# Patient Record
Sex: Female | Born: 1940 | Race: White | Hispanic: No | Smoking: Former smoker
Health system: Southern US, Community
[De-identification: ages and names within clinical notes are randomized; demographics above are authoritative.]

## PROBLEM LIST (undated history)

## (undated) DIAGNOSIS — I10 Essential (primary) hypertension: Secondary | ICD-10-CM

## (undated) DIAGNOSIS — C50912 Malignant neoplasm of unspecified site of left female breast: Secondary | ICD-10-CM

## (undated) DIAGNOSIS — I739 Peripheral vascular disease, unspecified: Secondary | ICD-10-CM

## (undated) DIAGNOSIS — E7800 Pure hypercholesterolemia, unspecified: Secondary | ICD-10-CM

## (undated) DIAGNOSIS — J45909 Unspecified asthma, uncomplicated: Secondary | ICD-10-CM

## (undated) DIAGNOSIS — R011 Cardiac murmur, unspecified: Secondary | ICD-10-CM

## (undated) DIAGNOSIS — K759 Inflammatory liver disease, unspecified: Secondary | ICD-10-CM

## (undated) DIAGNOSIS — G8929 Other chronic pain: Secondary | ICD-10-CM

## (undated) DIAGNOSIS — G4730 Sleep apnea, unspecified: Secondary | ICD-10-CM

## (undated) DIAGNOSIS — I82409 Acute embolism and thrombosis of unspecified deep veins of unspecified lower extremity: Secondary | ICD-10-CM

## (undated) DIAGNOSIS — I700 Atherosclerosis of aorta: Secondary | ICD-10-CM

## (undated) DIAGNOSIS — Z9289 Personal history of other medical treatment: Secondary | ICD-10-CM

## (undated) DIAGNOSIS — K219 Gastro-esophageal reflux disease without esophagitis: Secondary | ICD-10-CM

## (undated) DIAGNOSIS — I509 Heart failure, unspecified: Secondary | ICD-10-CM

## (undated) DIAGNOSIS — I219 Acute myocardial infarction, unspecified: Secondary | ICD-10-CM

## (undated) DIAGNOSIS — R519 Headache, unspecified: Secondary | ICD-10-CM

## (undated) DIAGNOSIS — IMO0001 Reserved for inherently not codable concepts without codable children: Secondary | ICD-10-CM

## (undated) DIAGNOSIS — R51 Headache: Secondary | ICD-10-CM

## (undated) DIAGNOSIS — I4891 Unspecified atrial fibrillation: Secondary | ICD-10-CM

## (undated) DIAGNOSIS — F329 Major depressive disorder, single episode, unspecified: Secondary | ICD-10-CM

## (undated) DIAGNOSIS — J449 Chronic obstructive pulmonary disease, unspecified: Secondary | ICD-10-CM

## (undated) DIAGNOSIS — E119 Type 2 diabetes mellitus without complications: Secondary | ICD-10-CM

## (undated) DIAGNOSIS — G2581 Restless legs syndrome: Secondary | ICD-10-CM

## (undated) DIAGNOSIS — I2510 Atherosclerotic heart disease of native coronary artery without angina pectoris: Secondary | ICD-10-CM

## (undated) DIAGNOSIS — E890 Postprocedural hypothyroidism: Secondary | ICD-10-CM

## (undated) DIAGNOSIS — F419 Anxiety disorder, unspecified: Secondary | ICD-10-CM

## (undated) DIAGNOSIS — Z8719 Personal history of other diseases of the digestive system: Secondary | ICD-10-CM

## (undated) DIAGNOSIS — M1990 Unspecified osteoarthritis, unspecified site: Secondary | ICD-10-CM

## (undated) DIAGNOSIS — Z9889 Other specified postprocedural states: Secondary | ICD-10-CM

## (undated) DIAGNOSIS — K5900 Constipation, unspecified: Secondary | ICD-10-CM

## (undated) DIAGNOSIS — D649 Anemia, unspecified: Secondary | ICD-10-CM

## (undated) DIAGNOSIS — G629 Polyneuropathy, unspecified: Secondary | ICD-10-CM

## (undated) DIAGNOSIS — M549 Dorsalgia, unspecified: Secondary | ICD-10-CM

## (undated) HISTORY — DX: Other specified postprocedural states: Z98.890

## (undated) HISTORY — PX: BREAST BIOPSY: SHX20

## (undated) HISTORY — DX: Gastro-esophageal reflux disease without esophagitis: K21.9

## (undated) HISTORY — DX: Atherosclerotic heart disease of native coronary artery without angina pectoris: I25.10

## (undated) HISTORY — PX: TRIGGER FINGER RELEASE: SHX641

## (undated) HISTORY — PX: HAMMER TOE SURGERY: SHX385

## (undated) HISTORY — PX: BARTHOLIN CYST MARSUPIALIZATION: SHX5383

## (undated) HISTORY — DX: Pure hypercholesterolemia, unspecified: E78.00

## (undated) HISTORY — DX: Essential (primary) hypertension: I10

## (undated) HISTORY — DX: Postprocedural hypothyroidism: E89.0

## (undated) HISTORY — PX: TUBAL LIGATION: SHX77

## (undated) HISTORY — DX: Sleep apnea, unspecified: G47.30

## (undated) HISTORY — PX: EYE SURGERY: SHX253

## (undated) HISTORY — DX: Morbid (severe) obesity due to excess calories: E66.01

## (undated) HISTORY — PX: SHOULDER SURGERY: SHX246

## (undated) HISTORY — DX: Constipation, unspecified: K59.00

## (undated) HISTORY — PX: VAGINAL HYSTERECTOMY: SUR661

## (undated) HISTORY — PX: TOE SURGERY: SHX1073

## (undated) HISTORY — DX: Major depressive disorder, single episode, unspecified: F32.9

## (undated) HISTORY — PX: CORONARY ANGIOPLASTY WITH STENT PLACEMENT: SHX49

## (undated) HISTORY — PX: JOINT REPLACEMENT: SHX530

## (undated) HISTORY — PX: CATARACT EXTRACTION W/ INTRAOCULAR LENS  IMPLANT, BILATERAL: SHX1307

## (undated) HISTORY — DX: Atherosclerosis of aorta: I70.0

## (undated) HISTORY — DX: Chronic obstructive pulmonary disease, unspecified: J44.9

---
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
## 1988-09-06 HISTORY — PX: TOTAL KNEE ARTHROPLASTY: SHX125

## 1988-09-06 HISTORY — PX: TOTAL HIP ARTHROPLASTY: SHX124

## 1988-09-06 HISTORY — PX: MASTECTOMY: SHX3

## 1998-09-07 HISTORY — PX: CARDIAC CATHETERIZATION: SHX172

## 2007-01-07 HISTORY — PX: CAROTID ENDARTERECTOMY: SUR193

## 2011-01-10 DIAGNOSIS — I259 Chronic ischemic heart disease, unspecified: Secondary | ICD-10-CM | POA: Diagnosis not present

## 2011-01-10 DIAGNOSIS — E119 Type 2 diabetes mellitus without complications: Secondary | ICD-10-CM | POA: Diagnosis not present

## 2011-01-10 DIAGNOSIS — J449 Chronic obstructive pulmonary disease, unspecified: Secondary | ICD-10-CM | POA: Diagnosis not present

## 2011-01-23 DIAGNOSIS — R569 Unspecified convulsions: Secondary | ICD-10-CM | POA: Diagnosis not present

## 2011-02-05 DIAGNOSIS — L851 Acquired keratosis [keratoderma] palmaris et plantaris: Secondary | ICD-10-CM | POA: Diagnosis not present

## 2011-02-05 DIAGNOSIS — E1049 Type 1 diabetes mellitus with other diabetic neurological complication: Secondary | ICD-10-CM | POA: Diagnosis not present

## 2011-02-05 DIAGNOSIS — B351 Tinea unguium: Secondary | ICD-10-CM | POA: Diagnosis not present

## 2011-02-06 DIAGNOSIS — G4737 Central sleep apnea in conditions classified elsewhere: Secondary | ICD-10-CM | POA: Diagnosis not present

## 2011-02-19 DIAGNOSIS — I519 Heart disease, unspecified: Secondary | ICD-10-CM | POA: Diagnosis not present

## 2011-02-19 DIAGNOSIS — E119 Type 2 diabetes mellitus without complications: Secondary | ICD-10-CM | POA: Diagnosis not present

## 2011-02-19 DIAGNOSIS — J449 Chronic obstructive pulmonary disease, unspecified: Secondary | ICD-10-CM | POA: Diagnosis not present

## 2011-02-19 DIAGNOSIS — I10 Essential (primary) hypertension: Secondary | ICD-10-CM | POA: Diagnosis not present

## 2011-02-25 DIAGNOSIS — F329 Major depressive disorder, single episode, unspecified: Secondary | ICD-10-CM | POA: Diagnosis not present

## 2011-02-25 DIAGNOSIS — E0590 Thyrotoxicosis, unspecified without thyrotoxic crisis or storm: Secondary | ICD-10-CM | POA: Diagnosis not present

## 2011-02-25 DIAGNOSIS — I519 Heart disease, unspecified: Secondary | ICD-10-CM | POA: Diagnosis not present

## 2011-02-25 DIAGNOSIS — I10 Essential (primary) hypertension: Secondary | ICD-10-CM | POA: Diagnosis not present

## 2011-02-25 DIAGNOSIS — E119 Type 2 diabetes mellitus without complications: Secondary | ICD-10-CM | POA: Diagnosis not present

## 2011-03-19 DIAGNOSIS — I10 Essential (primary) hypertension: Secondary | ICD-10-CM | POA: Diagnosis not present

## 2011-03-19 DIAGNOSIS — J449 Chronic obstructive pulmonary disease, unspecified: Secondary | ICD-10-CM | POA: Diagnosis not present

## 2011-03-19 DIAGNOSIS — E109 Type 1 diabetes mellitus without complications: Secondary | ICD-10-CM | POA: Diagnosis not present

## 2011-03-24 DIAGNOSIS — M25549 Pain in joints of unspecified hand: Secondary | ICD-10-CM | POA: Diagnosis not present

## 2011-03-24 DIAGNOSIS — M1710 Unilateral primary osteoarthritis, unspecified knee: Secondary | ICD-10-CM | POA: Diagnosis not present

## 2011-03-24 DIAGNOSIS — M25569 Pain in unspecified knee: Secondary | ICD-10-CM | POA: Diagnosis not present

## 2011-03-24 DIAGNOSIS — M67919 Unspecified disorder of synovium and tendon, unspecified shoulder: Secondary | ICD-10-CM | POA: Diagnosis not present

## 2011-03-24 DIAGNOSIS — M719 Bursopathy, unspecified: Secondary | ICD-10-CM | POA: Diagnosis not present

## 2011-03-25 DIAGNOSIS — E1142 Type 2 diabetes mellitus with diabetic polyneuropathy: Secondary | ICD-10-CM | POA: Diagnosis not present

## 2011-03-25 DIAGNOSIS — E1149 Type 2 diabetes mellitus with other diabetic neurological complication: Secondary | ICD-10-CM | POA: Diagnosis not present

## 2011-03-25 DIAGNOSIS — I70209 Unspecified atherosclerosis of native arteries of extremities, unspecified extremity: Secondary | ICD-10-CM | POA: Diagnosis not present

## 2011-04-07 DIAGNOSIS — J449 Chronic obstructive pulmonary disease, unspecified: Secondary | ICD-10-CM | POA: Diagnosis not present

## 2011-04-07 DIAGNOSIS — G4733 Obstructive sleep apnea (adult) (pediatric): Secondary | ICD-10-CM | POA: Diagnosis not present

## 2011-04-08 DIAGNOSIS — J449 Chronic obstructive pulmonary disease, unspecified: Secondary | ICD-10-CM | POA: Diagnosis not present

## 2011-04-24 DIAGNOSIS — M5137 Other intervertebral disc degeneration, lumbosacral region: Secondary | ICD-10-CM | POA: Diagnosis not present

## 2011-04-24 DIAGNOSIS — M999 Biomechanical lesion, unspecified: Secondary | ICD-10-CM | POA: Diagnosis not present

## 2011-04-24 DIAGNOSIS — M545 Low back pain: Secondary | ICD-10-CM | POA: Diagnosis not present

## 2011-05-06 DIAGNOSIS — I10 Essential (primary) hypertension: Secondary | ICD-10-CM | POA: Diagnosis not present

## 2011-05-06 DIAGNOSIS — E11329 Type 2 diabetes mellitus with mild nonproliferative diabetic retinopathy without macular edema: Secondary | ICD-10-CM | POA: Diagnosis not present

## 2011-05-06 DIAGNOSIS — E1139 Type 2 diabetes mellitus with other diabetic ophthalmic complication: Secondary | ICD-10-CM | POA: Diagnosis not present

## 2011-05-23 DIAGNOSIS — J449 Chronic obstructive pulmonary disease, unspecified: Secondary | ICD-10-CM | POA: Diagnosis not present

## 2011-05-23 DIAGNOSIS — M5030 Other cervical disc degeneration, unspecified cervical region: Secondary | ICD-10-CM | POA: Diagnosis not present

## 2011-05-23 DIAGNOSIS — M5137 Other intervertebral disc degeneration, lumbosacral region: Secondary | ICD-10-CM | POA: Diagnosis not present

## 2011-05-26 DIAGNOSIS — E785 Hyperlipidemia, unspecified: Secondary | ICD-10-CM | POA: Diagnosis not present

## 2011-05-26 DIAGNOSIS — E039 Hypothyroidism, unspecified: Secondary | ICD-10-CM | POA: Diagnosis not present

## 2011-05-26 DIAGNOSIS — J441 Chronic obstructive pulmonary disease with (acute) exacerbation: Secondary | ICD-10-CM | POA: Diagnosis not present

## 2011-05-26 DIAGNOSIS — I214 Non-ST elevation (NSTEMI) myocardial infarction: Secondary | ICD-10-CM | POA: Diagnosis not present

## 2011-05-26 DIAGNOSIS — I4460 Unspecified fascicular block: Secondary | ICD-10-CM | POA: Diagnosis not present

## 2011-05-26 DIAGNOSIS — R079 Chest pain, unspecified: Secondary | ICD-10-CM | POA: Diagnosis not present

## 2011-05-26 DIAGNOSIS — K219 Gastro-esophageal reflux disease without esophagitis: Secondary | ICD-10-CM | POA: Diagnosis not present

## 2011-05-27 DIAGNOSIS — E039 Hypothyroidism, unspecified: Secondary | ICD-10-CM | POA: Diagnosis not present

## 2011-05-27 DIAGNOSIS — I2510 Atherosclerotic heart disease of native coronary artery without angina pectoris: Secondary | ICD-10-CM | POA: Diagnosis not present

## 2011-05-27 DIAGNOSIS — Z794 Long term (current) use of insulin: Secondary | ICD-10-CM | POA: Diagnosis not present

## 2011-05-27 DIAGNOSIS — I252 Old myocardial infarction: Secondary | ICD-10-CM | POA: Diagnosis not present

## 2011-05-27 DIAGNOSIS — I359 Nonrheumatic aortic valve disorder, unspecified: Secondary | ICD-10-CM | POA: Diagnosis not present

## 2011-05-27 DIAGNOSIS — J449 Chronic obstructive pulmonary disease, unspecified: Secondary | ICD-10-CM | POA: Diagnosis not present

## 2011-05-27 DIAGNOSIS — Z9010 Acquired absence of unspecified breast and nipple: Secondary | ICD-10-CM | POA: Diagnosis not present

## 2011-05-27 DIAGNOSIS — Z7982 Long term (current) use of aspirin: Secondary | ICD-10-CM | POA: Diagnosis not present

## 2011-05-27 DIAGNOSIS — I214 Non-ST elevation (NSTEMI) myocardial infarction: Secondary | ICD-10-CM | POA: Diagnosis not present

## 2011-05-27 DIAGNOSIS — Z853 Personal history of malignant neoplasm of breast: Secondary | ICD-10-CM | POA: Diagnosis not present

## 2011-05-27 DIAGNOSIS — E119 Type 2 diabetes mellitus without complications: Secondary | ICD-10-CM | POA: Diagnosis not present

## 2011-05-27 DIAGNOSIS — Z9981 Dependence on supplemental oxygen: Secondary | ICD-10-CM | POA: Diagnosis not present

## 2011-05-27 DIAGNOSIS — E785 Hyperlipidemia, unspecified: Secondary | ICD-10-CM | POA: Diagnosis not present

## 2011-05-27 DIAGNOSIS — I739 Peripheral vascular disease, unspecified: Secondary | ICD-10-CM | POA: Diagnosis not present

## 2011-05-27 DIAGNOSIS — I10 Essential (primary) hypertension: Secondary | ICD-10-CM | POA: Diagnosis not present

## 2011-05-27 DIAGNOSIS — E782 Mixed hyperlipidemia: Secondary | ICD-10-CM | POA: Diagnosis not present

## 2011-05-27 DIAGNOSIS — I6529 Occlusion and stenosis of unspecified carotid artery: Secondary | ICD-10-CM | POA: Diagnosis not present

## 2011-05-27 DIAGNOSIS — I509 Heart failure, unspecified: Secondary | ICD-10-CM | POA: Diagnosis not present

## 2011-05-27 DIAGNOSIS — Z9861 Coronary angioplasty status: Secondary | ICD-10-CM | POA: Diagnosis not present

## 2011-05-27 DIAGNOSIS — F411 Generalized anxiety disorder: Secondary | ICD-10-CM | POA: Diagnosis not present

## 2011-05-27 DIAGNOSIS — J438 Other emphysema: Secondary | ICD-10-CM | POA: Diagnosis not present

## 2011-05-27 DIAGNOSIS — F329 Major depressive disorder, single episode, unspecified: Secondary | ICD-10-CM | POA: Diagnosis not present

## 2011-05-27 DIAGNOSIS — R079 Chest pain, unspecified: Secondary | ICD-10-CM | POA: Diagnosis not present

## 2011-05-27 DIAGNOSIS — M7989 Other specified soft tissue disorders: Secondary | ICD-10-CM | POA: Diagnosis not present

## 2011-05-27 DIAGNOSIS — Z87891 Personal history of nicotine dependence: Secondary | ICD-10-CM | POA: Diagnosis not present

## 2011-05-27 DIAGNOSIS — K219 Gastro-esophageal reflux disease without esophagitis: Secondary | ICD-10-CM | POA: Diagnosis not present

## 2011-05-28 DIAGNOSIS — J449 Chronic obstructive pulmonary disease, unspecified: Secondary | ICD-10-CM | POA: Diagnosis not present

## 2011-05-28 DIAGNOSIS — I2510 Atherosclerotic heart disease of native coronary artery without angina pectoris: Secondary | ICD-10-CM | POA: Diagnosis not present

## 2011-05-28 DIAGNOSIS — E119 Type 2 diabetes mellitus without complications: Secondary | ICD-10-CM | POA: Diagnosis not present

## 2011-05-28 DIAGNOSIS — I6529 Occlusion and stenosis of unspecified carotid artery: Secondary | ICD-10-CM | POA: Diagnosis not present

## 2011-05-28 DIAGNOSIS — J438 Other emphysema: Secondary | ICD-10-CM | POA: Diagnosis not present

## 2011-05-28 DIAGNOSIS — I214 Non-ST elevation (NSTEMI) myocardial infarction: Secondary | ICD-10-CM | POA: Diagnosis not present

## 2011-05-28 DIAGNOSIS — I252 Old myocardial infarction: Secondary | ICD-10-CM | POA: Diagnosis not present

## 2011-05-29 DIAGNOSIS — I519 Heart disease, unspecified: Secondary | ICD-10-CM | POA: Diagnosis not present

## 2011-05-29 DIAGNOSIS — J449 Chronic obstructive pulmonary disease, unspecified: Secondary | ICD-10-CM | POA: Diagnosis not present

## 2011-06-03 DIAGNOSIS — M25559 Pain in unspecified hip: Secondary | ICD-10-CM | POA: Diagnosis not present

## 2011-06-03 DIAGNOSIS — M5137 Other intervertebral disc degeneration, lumbosacral region: Secondary | ICD-10-CM | POA: Diagnosis not present

## 2011-06-03 DIAGNOSIS — W19XXXA Unspecified fall, initial encounter: Secondary | ICD-10-CM | POA: Diagnosis not present

## 2011-06-03 DIAGNOSIS — M545 Low back pain: Secondary | ICD-10-CM | POA: Diagnosis not present

## 2011-06-03 DIAGNOSIS — Z96649 Presence of unspecified artificial hip joint: Secondary | ICD-10-CM | POA: Diagnosis not present

## 2011-06-05 DIAGNOSIS — M719 Bursopathy, unspecified: Secondary | ICD-10-CM | POA: Diagnosis not present

## 2011-06-05 DIAGNOSIS — M67919 Unspecified disorder of synovium and tendon, unspecified shoulder: Secondary | ICD-10-CM | POA: Diagnosis not present

## 2011-06-16 DIAGNOSIS — J449 Chronic obstructive pulmonary disease, unspecified: Secondary | ICD-10-CM | POA: Diagnosis not present

## 2011-06-16 DIAGNOSIS — G4733 Obstructive sleep apnea (adult) (pediatric): Secondary | ICD-10-CM | POA: Diagnosis not present

## 2011-06-26 DIAGNOSIS — I509 Heart failure, unspecified: Secondary | ICD-10-CM | POA: Diagnosis not present

## 2011-06-26 DIAGNOSIS — I10 Essential (primary) hypertension: Secondary | ICD-10-CM | POA: Diagnosis not present

## 2011-06-26 DIAGNOSIS — E7800 Pure hypercholesterolemia, unspecified: Secondary | ICD-10-CM | POA: Diagnosis not present

## 2011-06-26 DIAGNOSIS — I519 Heart disease, unspecified: Secondary | ICD-10-CM | POA: Diagnosis not present

## 2011-06-27 DIAGNOSIS — E7800 Pure hypercholesterolemia, unspecified: Secondary | ICD-10-CM | POA: Diagnosis not present

## 2011-06-27 DIAGNOSIS — I10 Essential (primary) hypertension: Secondary | ICD-10-CM | POA: Diagnosis not present

## 2011-06-27 DIAGNOSIS — E039 Hypothyroidism, unspecified: Secondary | ICD-10-CM | POA: Diagnosis not present

## 2011-06-27 DIAGNOSIS — E119 Type 2 diabetes mellitus without complications: Secondary | ICD-10-CM | POA: Diagnosis not present

## 2011-06-27 DIAGNOSIS — I519 Heart disease, unspecified: Secondary | ICD-10-CM | POA: Diagnosis not present

## 2011-06-27 DIAGNOSIS — I509 Heart failure, unspecified: Secondary | ICD-10-CM | POA: Diagnosis not present

## 2011-06-30 DIAGNOSIS — E782 Mixed hyperlipidemia: Secondary | ICD-10-CM | POA: Diagnosis not present

## 2011-06-30 DIAGNOSIS — I2510 Atherosclerotic heart disease of native coronary artery without angina pectoris: Secondary | ICD-10-CM | POA: Diagnosis not present

## 2011-06-30 DIAGNOSIS — I214 Non-ST elevation (NSTEMI) myocardial infarction: Secondary | ICD-10-CM | POA: Diagnosis not present

## 2011-06-30 DIAGNOSIS — I10 Essential (primary) hypertension: Secondary | ICD-10-CM | POA: Diagnosis not present

## 2011-07-03 DIAGNOSIS — M719 Bursopathy, unspecified: Secondary | ICD-10-CM | POA: Diagnosis not present

## 2011-07-03 DIAGNOSIS — M67919 Unspecified disorder of synovium and tendon, unspecified shoulder: Secondary | ICD-10-CM | POA: Diagnosis not present

## 2011-07-03 DIAGNOSIS — M5030 Other cervical disc degeneration, unspecified cervical region: Secondary | ICD-10-CM | POA: Diagnosis not present

## 2011-07-03 DIAGNOSIS — M542 Cervicalgia: Secondary | ICD-10-CM | POA: Diagnosis not present

## 2011-07-03 DIAGNOSIS — M47812 Spondylosis without myelopathy or radiculopathy, cervical region: Secondary | ICD-10-CM | POA: Diagnosis not present

## 2011-07-04 DIAGNOSIS — K219 Gastro-esophageal reflux disease without esophagitis: Secondary | ICD-10-CM | POA: Diagnosis not present

## 2011-07-04 DIAGNOSIS — I2510 Atherosclerotic heart disease of native coronary artery without angina pectoris: Secondary | ICD-10-CM | POA: Diagnosis not present

## 2011-07-04 DIAGNOSIS — Z96649 Presence of unspecified artificial hip joint: Secondary | ICD-10-CM | POA: Diagnosis not present

## 2011-07-04 DIAGNOSIS — Z87891 Personal history of nicotine dependence: Secondary | ICD-10-CM | POA: Diagnosis not present

## 2011-07-04 DIAGNOSIS — I359 Nonrheumatic aortic valve disorder, unspecified: Secondary | ICD-10-CM | POA: Diagnosis not present

## 2011-07-04 DIAGNOSIS — E1169 Type 2 diabetes mellitus with other specified complication: Secondary | ICD-10-CM | POA: Diagnosis not present

## 2011-07-04 DIAGNOSIS — R609 Edema, unspecified: Secondary | ICD-10-CM | POA: Diagnosis not present

## 2011-07-04 DIAGNOSIS — I10 Essential (primary) hypertension: Secondary | ICD-10-CM | POA: Diagnosis not present

## 2011-07-04 DIAGNOSIS — E669 Obesity, unspecified: Secondary | ICD-10-CM | POA: Diagnosis not present

## 2011-07-04 DIAGNOSIS — Z853 Personal history of malignant neoplasm of breast: Secondary | ICD-10-CM | POA: Diagnosis not present

## 2011-07-04 DIAGNOSIS — R030 Elevated blood-pressure reading, without diagnosis of hypertension: Secondary | ICD-10-CM | POA: Diagnosis not present

## 2011-07-04 DIAGNOSIS — E119 Type 2 diabetes mellitus without complications: Secondary | ICD-10-CM | POA: Diagnosis not present

## 2011-07-04 DIAGNOSIS — Z9861 Coronary angioplasty status: Secondary | ICD-10-CM | POA: Diagnosis not present

## 2011-07-04 DIAGNOSIS — Z7982 Long term (current) use of aspirin: Secondary | ICD-10-CM | POA: Diagnosis not present

## 2011-07-04 DIAGNOSIS — Z8249 Family history of ischemic heart disease and other diseases of the circulatory system: Secondary | ICD-10-CM | POA: Diagnosis not present

## 2011-07-04 DIAGNOSIS — R079 Chest pain, unspecified: Secondary | ICD-10-CM | POA: Diagnosis not present

## 2011-07-04 DIAGNOSIS — Z6836 Body mass index (BMI) 36.0-36.9, adult: Secondary | ICD-10-CM | POA: Diagnosis not present

## 2011-07-04 DIAGNOSIS — R011 Cardiac murmur, unspecified: Secondary | ICD-10-CM | POA: Diagnosis not present

## 2011-07-04 DIAGNOSIS — E785 Hyperlipidemia, unspecified: Secondary | ICD-10-CM | POA: Diagnosis not present

## 2011-07-04 DIAGNOSIS — R0602 Shortness of breath: Secondary | ICD-10-CM | POA: Diagnosis not present

## 2011-07-04 DIAGNOSIS — J449 Chronic obstructive pulmonary disease, unspecified: Secondary | ICD-10-CM | POA: Diagnosis not present

## 2011-07-04 DIAGNOSIS — E7800 Pure hypercholesterolemia, unspecified: Secondary | ICD-10-CM | POA: Diagnosis not present

## 2011-07-04 DIAGNOSIS — E039 Hypothyroidism, unspecified: Secondary | ICD-10-CM | POA: Diagnosis not present

## 2011-07-04 DIAGNOSIS — F411 Generalized anxiety disorder: Secondary | ICD-10-CM | POA: Diagnosis not present

## 2011-07-07 DIAGNOSIS — M4802 Spinal stenosis, cervical region: Secondary | ICD-10-CM | POA: Diagnosis not present

## 2011-07-08 DIAGNOSIS — G4733 Obstructive sleep apnea (adult) (pediatric): Secondary | ICD-10-CM | POA: Diagnosis not present

## 2011-07-08 DIAGNOSIS — M75100 Unspecified rotator cuff tear or rupture of unspecified shoulder, not specified as traumatic: Secondary | ICD-10-CM | POA: Diagnosis not present

## 2011-07-08 DIAGNOSIS — M25519 Pain in unspecified shoulder: Secondary | ICD-10-CM | POA: Diagnosis not present

## 2011-07-08 DIAGNOSIS — M19039 Primary osteoarthritis, unspecified wrist: Secondary | ICD-10-CM | POA: Diagnosis not present

## 2011-07-08 DIAGNOSIS — J449 Chronic obstructive pulmonary disease, unspecified: Secondary | ICD-10-CM | POA: Diagnosis not present

## 2011-07-11 DIAGNOSIS — E7800 Pure hypercholesterolemia, unspecified: Secondary | ICD-10-CM | POA: Diagnosis not present

## 2011-07-11 DIAGNOSIS — M75100 Unspecified rotator cuff tear or rupture of unspecified shoulder, not specified as traumatic: Secondary | ICD-10-CM | POA: Diagnosis not present

## 2011-07-11 DIAGNOSIS — E039 Hypothyroidism, unspecified: Secondary | ICD-10-CM | POA: Diagnosis not present

## 2011-07-11 DIAGNOSIS — M19039 Primary osteoarthritis, unspecified wrist: Secondary | ICD-10-CM | POA: Diagnosis not present

## 2011-07-11 DIAGNOSIS — M25519 Pain in unspecified shoulder: Secondary | ICD-10-CM | POA: Diagnosis not present

## 2011-07-11 DIAGNOSIS — I10 Essential (primary) hypertension: Secondary | ICD-10-CM | POA: Diagnosis not present

## 2011-07-14 DIAGNOSIS — M75100 Unspecified rotator cuff tear or rupture of unspecified shoulder, not specified as traumatic: Secondary | ICD-10-CM | POA: Diagnosis not present

## 2011-07-14 DIAGNOSIS — G4733 Obstructive sleep apnea (adult) (pediatric): Secondary | ICD-10-CM | POA: Diagnosis not present

## 2011-07-14 DIAGNOSIS — M19039 Primary osteoarthritis, unspecified wrist: Secondary | ICD-10-CM | POA: Diagnosis not present

## 2011-07-14 DIAGNOSIS — J449 Chronic obstructive pulmonary disease, unspecified: Secondary | ICD-10-CM | POA: Diagnosis not present

## 2011-07-14 DIAGNOSIS — M25519 Pain in unspecified shoulder: Secondary | ICD-10-CM | POA: Diagnosis not present

## 2011-07-16 DIAGNOSIS — M7730 Calcaneal spur, unspecified foot: Secondary | ICD-10-CM | POA: Diagnosis not present

## 2011-07-16 DIAGNOSIS — M19079 Primary osteoarthritis, unspecified ankle and foot: Secondary | ICD-10-CM | POA: Diagnosis not present

## 2011-07-16 DIAGNOSIS — M7750 Other enthesopathy of unspecified foot: Secondary | ICD-10-CM | POA: Diagnosis not present

## 2011-07-21 DIAGNOSIS — G4733 Obstructive sleep apnea (adult) (pediatric): Secondary | ICD-10-CM | POA: Diagnosis not present

## 2011-07-21 DIAGNOSIS — M75100 Unspecified rotator cuff tear or rupture of unspecified shoulder, not specified as traumatic: Secondary | ICD-10-CM | POA: Diagnosis not present

## 2011-07-21 DIAGNOSIS — M19039 Primary osteoarthritis, unspecified wrist: Secondary | ICD-10-CM | POA: Diagnosis not present

## 2011-07-21 DIAGNOSIS — M25519 Pain in unspecified shoulder: Secondary | ICD-10-CM | POA: Diagnosis not present

## 2011-07-21 DIAGNOSIS — J449 Chronic obstructive pulmonary disease, unspecified: Secondary | ICD-10-CM | POA: Diagnosis not present

## 2011-07-22 DIAGNOSIS — E1142 Type 2 diabetes mellitus with diabetic polyneuropathy: Secondary | ICD-10-CM | POA: Diagnosis not present

## 2011-07-22 DIAGNOSIS — I70209 Unspecified atherosclerosis of native arteries of extremities, unspecified extremity: Secondary | ICD-10-CM | POA: Diagnosis not present

## 2011-07-22 DIAGNOSIS — G4733 Obstructive sleep apnea (adult) (pediatric): Secondary | ICD-10-CM | POA: Diagnosis not present

## 2011-07-22 DIAGNOSIS — E1149 Type 2 diabetes mellitus with other diabetic neurological complication: Secondary | ICD-10-CM | POA: Diagnosis not present

## 2011-07-22 DIAGNOSIS — J449 Chronic obstructive pulmonary disease, unspecified: Secondary | ICD-10-CM | POA: Diagnosis not present

## 2011-07-24 DIAGNOSIS — M25519 Pain in unspecified shoulder: Secondary | ICD-10-CM | POA: Diagnosis not present

## 2011-07-24 DIAGNOSIS — G4733 Obstructive sleep apnea (adult) (pediatric): Secondary | ICD-10-CM | POA: Diagnosis not present

## 2011-07-24 DIAGNOSIS — J449 Chronic obstructive pulmonary disease, unspecified: Secondary | ICD-10-CM | POA: Diagnosis not present

## 2011-07-24 DIAGNOSIS — M19039 Primary osteoarthritis, unspecified wrist: Secondary | ICD-10-CM | POA: Diagnosis not present

## 2011-07-24 DIAGNOSIS — M75100 Unspecified rotator cuff tear or rupture of unspecified shoulder, not specified as traumatic: Secondary | ICD-10-CM | POA: Diagnosis not present

## 2011-07-28 DIAGNOSIS — M19039 Primary osteoarthritis, unspecified wrist: Secondary | ICD-10-CM | POA: Diagnosis not present

## 2011-07-28 DIAGNOSIS — M25519 Pain in unspecified shoulder: Secondary | ICD-10-CM | POA: Diagnosis not present

## 2011-07-28 DIAGNOSIS — J449 Chronic obstructive pulmonary disease, unspecified: Secondary | ICD-10-CM | POA: Diagnosis not present

## 2011-07-28 DIAGNOSIS — G4733 Obstructive sleep apnea (adult) (pediatric): Secondary | ICD-10-CM | POA: Diagnosis not present

## 2011-07-28 DIAGNOSIS — M75100 Unspecified rotator cuff tear or rupture of unspecified shoulder, not specified as traumatic: Secondary | ICD-10-CM | POA: Diagnosis not present

## 2011-07-29 DIAGNOSIS — I119 Hypertensive heart disease without heart failure: Secondary | ICD-10-CM | POA: Diagnosis not present

## 2011-07-31 DIAGNOSIS — M25519 Pain in unspecified shoulder: Secondary | ICD-10-CM | POA: Diagnosis not present

## 2011-07-31 DIAGNOSIS — M75100 Unspecified rotator cuff tear or rupture of unspecified shoulder, not specified as traumatic: Secondary | ICD-10-CM | POA: Diagnosis not present

## 2011-07-31 DIAGNOSIS — J449 Chronic obstructive pulmonary disease, unspecified: Secondary | ICD-10-CM | POA: Diagnosis not present

## 2011-07-31 DIAGNOSIS — M19039 Primary osteoarthritis, unspecified wrist: Secondary | ICD-10-CM | POA: Diagnosis not present

## 2011-07-31 DIAGNOSIS — G4733 Obstructive sleep apnea (adult) (pediatric): Secondary | ICD-10-CM | POA: Diagnosis not present

## 2011-08-04 DIAGNOSIS — J449 Chronic obstructive pulmonary disease, unspecified: Secondary | ICD-10-CM | POA: Diagnosis not present

## 2011-08-04 DIAGNOSIS — M19039 Primary osteoarthritis, unspecified wrist: Secondary | ICD-10-CM | POA: Diagnosis not present

## 2011-08-04 DIAGNOSIS — G4733 Obstructive sleep apnea (adult) (pediatric): Secondary | ICD-10-CM | POA: Diagnosis not present

## 2011-08-04 DIAGNOSIS — M75100 Unspecified rotator cuff tear or rupture of unspecified shoulder, not specified as traumatic: Secondary | ICD-10-CM | POA: Diagnosis not present

## 2011-08-04 DIAGNOSIS — M25519 Pain in unspecified shoulder: Secondary | ICD-10-CM | POA: Diagnosis not present

## 2011-08-05 DIAGNOSIS — M75100 Unspecified rotator cuff tear or rupture of unspecified shoulder, not specified as traumatic: Secondary | ICD-10-CM | POA: Diagnosis not present

## 2011-08-05 DIAGNOSIS — M19039 Primary osteoarthritis, unspecified wrist: Secondary | ICD-10-CM | POA: Diagnosis not present

## 2011-08-05 DIAGNOSIS — M25519 Pain in unspecified shoulder: Secondary | ICD-10-CM | POA: Diagnosis not present

## 2011-08-05 DIAGNOSIS — G4733 Obstructive sleep apnea (adult) (pediatric): Secondary | ICD-10-CM | POA: Diagnosis not present

## 2011-08-05 DIAGNOSIS — J449 Chronic obstructive pulmonary disease, unspecified: Secondary | ICD-10-CM | POA: Diagnosis not present

## 2011-08-07 DIAGNOSIS — I779 Disorder of arteries and arterioles, unspecified: Secondary | ICD-10-CM | POA: Diagnosis not present

## 2011-08-07 DIAGNOSIS — I739 Peripheral vascular disease, unspecified: Secondary | ICD-10-CM | POA: Diagnosis not present

## 2011-08-07 DIAGNOSIS — I719 Aortic aneurysm of unspecified site, without rupture: Secondary | ICD-10-CM | POA: Diagnosis not present

## 2011-08-11 DIAGNOSIS — M2040 Other hammer toe(s) (acquired), unspecified foot: Secondary | ICD-10-CM | POA: Diagnosis not present

## 2011-08-11 DIAGNOSIS — M24576 Contracture, unspecified foot: Secondary | ICD-10-CM | POA: Diagnosis not present

## 2011-08-11 DIAGNOSIS — M7750 Other enthesopathy of unspecified foot: Secondary | ICD-10-CM | POA: Diagnosis not present

## 2011-08-11 DIAGNOSIS — M779 Enthesopathy, unspecified: Secondary | ICD-10-CM | POA: Diagnosis not present

## 2011-08-11 DIAGNOSIS — E1159 Type 2 diabetes mellitus with other circulatory complications: Secondary | ICD-10-CM | POA: Diagnosis not present

## 2011-08-13 DIAGNOSIS — M67919 Unspecified disorder of synovium and tendon, unspecified shoulder: Secondary | ICD-10-CM | POA: Diagnosis not present

## 2011-08-13 DIAGNOSIS — M5030 Other cervical disc degeneration, unspecified cervical region: Secondary | ICD-10-CM | POA: Diagnosis not present

## 2011-08-14 DIAGNOSIS — Q278 Other specified congenital malformations of peripheral vascular system: Secondary | ICD-10-CM | POA: Diagnosis not present

## 2011-08-29 DIAGNOSIS — J449 Chronic obstructive pulmonary disease, unspecified: Secondary | ICD-10-CM | POA: Diagnosis not present

## 2011-08-29 DIAGNOSIS — G4733 Obstructive sleep apnea (adult) (pediatric): Secondary | ICD-10-CM | POA: Diagnosis not present

## 2011-08-29 DIAGNOSIS — E109 Type 1 diabetes mellitus without complications: Secondary | ICD-10-CM | POA: Diagnosis not present

## 2011-08-29 DIAGNOSIS — J209 Acute bronchitis, unspecified: Secondary | ICD-10-CM | POA: Diagnosis not present

## 2011-09-15 DIAGNOSIS — M2040 Other hammer toe(s) (acquired), unspecified foot: Secondary | ICD-10-CM | POA: Diagnosis not present

## 2011-09-15 DIAGNOSIS — M24576 Contracture, unspecified foot: Secondary | ICD-10-CM | POA: Diagnosis not present

## 2011-09-15 DIAGNOSIS — M24573 Contracture, unspecified ankle: Secondary | ICD-10-CM | POA: Diagnosis not present

## 2011-09-23 DIAGNOSIS — Z01818 Encounter for other preprocedural examination: Secondary | ICD-10-CM | POA: Diagnosis not present

## 2011-09-23 DIAGNOSIS — E782 Mixed hyperlipidemia: Secondary | ICD-10-CM | POA: Diagnosis not present

## 2011-09-23 DIAGNOSIS — I2510 Atherosclerotic heart disease of native coronary artery without angina pectoris: Secondary | ICD-10-CM | POA: Diagnosis not present

## 2011-09-23 DIAGNOSIS — G4733 Obstructive sleep apnea (adult) (pediatric): Secondary | ICD-10-CM | POA: Diagnosis not present

## 2011-09-23 DIAGNOSIS — J449 Chronic obstructive pulmonary disease, unspecified: Secondary | ICD-10-CM | POA: Diagnosis not present

## 2011-09-23 DIAGNOSIS — Z23 Encounter for immunization: Secondary | ICD-10-CM | POA: Diagnosis not present

## 2011-09-23 DIAGNOSIS — M2040 Other hammer toe(s) (acquired), unspecified foot: Secondary | ICD-10-CM | POA: Diagnosis not present

## 2011-11-18 DIAGNOSIS — J019 Acute sinusitis, unspecified: Secondary | ICD-10-CM | POA: Diagnosis not present

## 2011-11-18 DIAGNOSIS — J441 Chronic obstructive pulmonary disease with (acute) exacerbation: Secondary | ICD-10-CM | POA: Diagnosis not present

## 2011-11-24 DIAGNOSIS — J449 Chronic obstructive pulmonary disease, unspecified: Secondary | ICD-10-CM | POA: Diagnosis not present

## 2011-11-24 DIAGNOSIS — M898X9 Other specified disorders of bone, unspecified site: Secondary | ICD-10-CM | POA: Diagnosis not present

## 2011-11-24 DIAGNOSIS — E119 Type 2 diabetes mellitus without complications: Secondary | ICD-10-CM | POA: Diagnosis not present

## 2011-11-24 DIAGNOSIS — I519 Heart disease, unspecified: Secondary | ICD-10-CM | POA: Diagnosis not present

## 2011-11-25 DIAGNOSIS — E1142 Type 2 diabetes mellitus with diabetic polyneuropathy: Secondary | ICD-10-CM | POA: Diagnosis not present

## 2011-11-25 DIAGNOSIS — E1149 Type 2 diabetes mellitus with other diabetic neurological complication: Secondary | ICD-10-CM | POA: Diagnosis not present

## 2011-11-25 DIAGNOSIS — I70209 Unspecified atherosclerosis of native arteries of extremities, unspecified extremity: Secondary | ICD-10-CM | POA: Diagnosis not present

## 2011-12-13 DIAGNOSIS — J449 Chronic obstructive pulmonary disease, unspecified: Secondary | ICD-10-CM | POA: Diagnosis not present

## 2011-12-13 DIAGNOSIS — H6980 Other specified disorders of Eustachian tube, unspecified ear: Secondary | ICD-10-CM | POA: Diagnosis not present

## 2011-12-13 DIAGNOSIS — I252 Old myocardial infarction: Secondary | ICD-10-CM | POA: Diagnosis not present

## 2011-12-13 DIAGNOSIS — E119 Type 2 diabetes mellitus without complications: Secondary | ICD-10-CM | POA: Diagnosis not present

## 2012-01-20 DIAGNOSIS — E0590 Thyrotoxicosis, unspecified without thyrotoxic crisis or storm: Secondary | ICD-10-CM | POA: Diagnosis not present

## 2012-01-20 DIAGNOSIS — J449 Chronic obstructive pulmonary disease, unspecified: Secondary | ICD-10-CM | POA: Diagnosis not present

## 2012-01-20 DIAGNOSIS — Z79899 Other long term (current) drug therapy: Secondary | ICD-10-CM | POA: Diagnosis not present

## 2012-01-20 DIAGNOSIS — I519 Heart disease, unspecified: Secondary | ICD-10-CM | POA: Diagnosis not present

## 2012-01-20 DIAGNOSIS — E119 Type 2 diabetes mellitus without complications: Secondary | ICD-10-CM | POA: Diagnosis not present

## 2012-01-26 DIAGNOSIS — M24573 Contracture, unspecified ankle: Secondary | ICD-10-CM | POA: Diagnosis not present

## 2012-01-26 DIAGNOSIS — Q859 Phakomatosis, unspecified: Secondary | ICD-10-CM | POA: Diagnosis not present

## 2012-01-26 DIAGNOSIS — M24576 Contracture, unspecified foot: Secondary | ICD-10-CM | POA: Diagnosis not present

## 2012-01-26 DIAGNOSIS — M7730 Calcaneal spur, unspecified foot: Secondary | ICD-10-CM | POA: Diagnosis not present

## 2012-01-26 DIAGNOSIS — D2370 Other benign neoplasm of skin of unspecified lower limb, including hip: Secondary | ICD-10-CM | POA: Diagnosis not present

## 2012-01-26 DIAGNOSIS — M779 Enthesopathy, unspecified: Secondary | ICD-10-CM | POA: Diagnosis not present

## 2012-01-26 DIAGNOSIS — M2040 Other hammer toe(s) (acquired), unspecified foot: Secondary | ICD-10-CM | POA: Diagnosis not present

## 2012-01-26 DIAGNOSIS — G5760 Lesion of plantar nerve, unspecified lower limb: Secondary | ICD-10-CM | POA: Diagnosis not present

## 2012-01-26 DIAGNOSIS — E1059 Type 1 diabetes mellitus with other circulatory complications: Secondary | ICD-10-CM | POA: Diagnosis not present

## 2012-01-26 DIAGNOSIS — M2140 Flat foot [pes planus] (acquired), unspecified foot: Secondary | ICD-10-CM | POA: Diagnosis not present

## 2012-02-05 DIAGNOSIS — H6690 Otitis media, unspecified, unspecified ear: Secondary | ICD-10-CM | POA: Diagnosis not present

## 2012-02-05 DIAGNOSIS — J019 Acute sinusitis, unspecified: Secondary | ICD-10-CM | POA: Diagnosis not present

## 2012-02-06 DIAGNOSIS — M2040 Other hammer toe(s) (acquired), unspecified foot: Secondary | ICD-10-CM | POA: Diagnosis not present

## 2012-02-06 DIAGNOSIS — M24576 Contracture, unspecified foot: Secondary | ICD-10-CM | POA: Diagnosis not present

## 2012-02-06 DIAGNOSIS — M24573 Contracture, unspecified ankle: Secondary | ICD-10-CM | POA: Diagnosis not present

## 2012-04-05 DIAGNOSIS — M2040 Other hammer toe(s) (acquired), unspecified foot: Secondary | ICD-10-CM | POA: Diagnosis not present

## 2012-04-05 DIAGNOSIS — E1059 Type 1 diabetes mellitus with other circulatory complications: Secondary | ICD-10-CM | POA: Diagnosis not present

## 2012-04-05 DIAGNOSIS — D2370 Other benign neoplasm of skin of unspecified lower limb, including hip: Secondary | ICD-10-CM | POA: Diagnosis not present

## 2012-04-05 DIAGNOSIS — M24573 Contracture, unspecified ankle: Secondary | ICD-10-CM | POA: Diagnosis not present

## 2012-04-05 DIAGNOSIS — B351 Tinea unguium: Secondary | ICD-10-CM | POA: Diagnosis not present

## 2012-04-05 DIAGNOSIS — G5760 Lesion of plantar nerve, unspecified lower limb: Secondary | ICD-10-CM | POA: Diagnosis not present

## 2012-04-05 DIAGNOSIS — M779 Enthesopathy, unspecified: Secondary | ICD-10-CM | POA: Diagnosis not present

## 2012-04-19 ENCOUNTER — Emergency Department: Payer: Self-pay | Admitting: Emergency Medicine

## 2012-04-19 DIAGNOSIS — Z9889 Other specified postprocedural states: Secondary | ICD-10-CM | POA: Diagnosis not present

## 2012-04-19 DIAGNOSIS — R0602 Shortness of breath: Secondary | ICD-10-CM | POA: Diagnosis not present

## 2012-04-19 DIAGNOSIS — J441 Chronic obstructive pulmonary disease with (acute) exacerbation: Secondary | ICD-10-CM | POA: Diagnosis not present

## 2012-04-19 DIAGNOSIS — I252 Old myocardial infarction: Secondary | ICD-10-CM | POA: Diagnosis not present

## 2012-04-19 DIAGNOSIS — R0789 Other chest pain: Secondary | ICD-10-CM | POA: Diagnosis not present

## 2012-04-19 DIAGNOSIS — Z95818 Presence of other cardiac implants and grafts: Secondary | ICD-10-CM | POA: Diagnosis not present

## 2012-04-19 DIAGNOSIS — E119 Type 2 diabetes mellitus without complications: Secondary | ICD-10-CM | POA: Diagnosis not present

## 2012-04-19 LAB — COMPREHENSIVE METABOLIC PANEL
Albumin: 3.6 g/dL (ref 3.4–5.0)
Alkaline Phosphatase: 133 U/L (ref 50–136)
Anion Gap: 5 — ABNORMAL LOW (ref 7–16)
Calcium, Total: 8.3 mg/dL — ABNORMAL LOW (ref 8.5–10.1)
Chloride: 107 mmol/L (ref 98–107)
Co2: 30 mmol/L (ref 21–32)
Creatinine: 0.96 mg/dL (ref 0.60–1.30)
EGFR (African American): >60
SGOT(AST): 31 U/L (ref 15–37)
SGPT (ALT): 27 U/L (ref 12–78)
Sodium: 142 mmol/L (ref 136–145)
Total Protein: 7.2 g/dL (ref 6.4–8.2)

## 2012-04-19 LAB — CBC
MCH: 26.7 pg (ref 26.0–34.0)
MCHC: 32.5 g/dL (ref 32.0–36.0)
MCV: 82 fL (ref 80–100)
RDW: 16.1 % — ABNORMAL HIGH (ref 11.5–14.5)
WBC: 6.5 10*3/uL (ref 3.6–11.0)

## 2012-04-19 LAB — PRO B NATRIURETIC PEPTIDE: B-Type Natriuretic Peptide: 158 pg/mL — ABNORMAL HIGH (ref 0–125)

## 2012-04-19 LAB — TROPONIN I: Troponin-I: <0.02 ng/mL

## 2012-04-27 DIAGNOSIS — E7800 Pure hypercholesterolemia, unspecified: Secondary | ICD-10-CM | POA: Diagnosis not present

## 2012-04-27 DIAGNOSIS — I119 Hypertensive heart disease without heart failure: Secondary | ICD-10-CM | POA: Diagnosis not present

## 2012-04-27 DIAGNOSIS — E1149 Type 2 diabetes mellitus with other diabetic neurological complication: Secondary | ICD-10-CM | POA: Diagnosis not present

## 2012-04-27 DIAGNOSIS — I2510 Atherosclerotic heart disease of native coronary artery without angina pectoris: Secondary | ICD-10-CM | POA: Diagnosis not present

## 2012-04-30 DIAGNOSIS — L0203 Carbuncle of face: Secondary | ICD-10-CM | POA: Diagnosis not present

## 2012-04-30 DIAGNOSIS — L0202 Furuncle of face: Secondary | ICD-10-CM | POA: Diagnosis not present

## 2012-04-30 DIAGNOSIS — M26609 Unspecified temporomandibular joint disorder, unspecified side: Secondary | ICD-10-CM | POA: Diagnosis not present

## 2012-05-25 DIAGNOSIS — E7800 Pure hypercholesterolemia, unspecified: Secondary | ICD-10-CM | POA: Diagnosis not present

## 2012-05-25 DIAGNOSIS — I119 Hypertensive heart disease without heart failure: Secondary | ICD-10-CM | POA: Diagnosis not present

## 2012-05-25 DIAGNOSIS — E890 Postprocedural hypothyroidism: Secondary | ICD-10-CM | POA: Diagnosis not present

## 2012-05-25 DIAGNOSIS — E1149 Type 2 diabetes mellitus with other diabetic neurological complication: Secondary | ICD-10-CM | POA: Diagnosis not present

## 2012-06-16 DIAGNOSIS — H524 Presbyopia: Secondary | ICD-10-CM | POA: Diagnosis not present

## 2012-06-16 DIAGNOSIS — H04129 Dry eye syndrome of unspecified lacrimal gland: Secondary | ICD-10-CM | POA: Diagnosis not present

## 2012-06-16 DIAGNOSIS — E109 Type 1 diabetes mellitus without complications: Secondary | ICD-10-CM | POA: Diagnosis not present

## 2012-07-12 DIAGNOSIS — E7800 Pure hypercholesterolemia, unspecified: Secondary | ICD-10-CM | POA: Diagnosis not present

## 2012-07-12 DIAGNOSIS — E1149 Type 2 diabetes mellitus with other diabetic neurological complication: Secondary | ICD-10-CM | POA: Diagnosis not present

## 2012-07-12 DIAGNOSIS — I119 Hypertensive heart disease without heart failure: Secondary | ICD-10-CM | POA: Diagnosis not present

## 2012-07-12 DIAGNOSIS — E890 Postprocedural hypothyroidism: Secondary | ICD-10-CM | POA: Diagnosis not present

## 2012-07-22 DIAGNOSIS — E1142 Type 2 diabetes mellitus with diabetic polyneuropathy: Secondary | ICD-10-CM | POA: Diagnosis not present

## 2012-07-22 DIAGNOSIS — B351 Tinea unguium: Secondary | ICD-10-CM | POA: Diagnosis not present

## 2012-07-22 DIAGNOSIS — L84 Corns and callosities: Secondary | ICD-10-CM | POA: Diagnosis not present

## 2012-08-16 DIAGNOSIS — E1149 Type 2 diabetes mellitus with other diabetic neurological complication: Secondary | ICD-10-CM | POA: Diagnosis not present

## 2012-08-16 DIAGNOSIS — I119 Hypertensive heart disease without heart failure: Secondary | ICD-10-CM | POA: Diagnosis not present

## 2012-08-16 DIAGNOSIS — E7800 Pure hypercholesterolemia, unspecified: Secondary | ICD-10-CM | POA: Diagnosis not present

## 2012-08-27 DIAGNOSIS — R112 Nausea with vomiting, unspecified: Secondary | ICD-10-CM | POA: Diagnosis not present

## 2012-08-27 DIAGNOSIS — J449 Chronic obstructive pulmonary disease, unspecified: Secondary | ICD-10-CM | POA: Diagnosis not present

## 2012-08-27 DIAGNOSIS — E119 Type 2 diabetes mellitus without complications: Secondary | ICD-10-CM | POA: Diagnosis not present

## 2012-08-27 DIAGNOSIS — K769 Liver disease, unspecified: Secondary | ICD-10-CM | POA: Diagnosis not present

## 2012-08-27 DIAGNOSIS — J4489 Other specified chronic obstructive pulmonary disease: Secondary | ICD-10-CM | POA: Diagnosis not present

## 2012-08-27 DIAGNOSIS — I2510 Atherosclerotic heart disease of native coronary artery without angina pectoris: Secondary | ICD-10-CM | POA: Diagnosis not present

## 2012-08-27 DIAGNOSIS — E785 Hyperlipidemia, unspecified: Secondary | ICD-10-CM | POA: Diagnosis not present

## 2012-08-27 DIAGNOSIS — R160 Hepatomegaly, not elsewhere classified: Secondary | ICD-10-CM | POA: Diagnosis not present

## 2012-08-27 DIAGNOSIS — I10 Essential (primary) hypertension: Secondary | ICD-10-CM | POA: Diagnosis not present

## 2012-08-27 DIAGNOSIS — R110 Nausea: Secondary | ICD-10-CM | POA: Diagnosis not present

## 2012-08-27 DIAGNOSIS — R0609 Other forms of dyspnea: Secondary | ICD-10-CM | POA: Diagnosis not present

## 2012-09-23 DIAGNOSIS — Z23 Encounter for immunization: Secondary | ICD-10-CM | POA: Diagnosis not present

## 2012-09-23 DIAGNOSIS — E7800 Pure hypercholesterolemia, unspecified: Secondary | ICD-10-CM | POA: Diagnosis not present

## 2012-09-23 DIAGNOSIS — I119 Hypertensive heart disease without heart failure: Secondary | ICD-10-CM | POA: Diagnosis not present

## 2012-09-23 DIAGNOSIS — E1149 Type 2 diabetes mellitus with other diabetic neurological complication: Secondary | ICD-10-CM | POA: Diagnosis not present

## 2012-10-21 DIAGNOSIS — L84 Corns and callosities: Secondary | ICD-10-CM | POA: Diagnosis not present

## 2012-10-21 DIAGNOSIS — B351 Tinea unguium: Secondary | ICD-10-CM | POA: Diagnosis not present

## 2012-10-21 DIAGNOSIS — E1139 Type 2 diabetes mellitus with other diabetic ophthalmic complication: Secondary | ICD-10-CM | POA: Diagnosis not present

## 2012-11-19 ENCOUNTER — Ambulatory Visit: Payer: Self-pay | Admitting: Family Medicine

## 2012-11-19 DIAGNOSIS — M5137 Other intervertebral disc degeneration, lumbosacral region: Secondary | ICD-10-CM | POA: Diagnosis not present

## 2012-11-19 DIAGNOSIS — M47817 Spondylosis without myelopathy or radiculopathy, lumbosacral region: Secondary | ICD-10-CM | POA: Diagnosis not present

## 2012-12-03 ENCOUNTER — Ambulatory Visit: Payer: Self-pay | Admitting: Family Medicine

## 2012-12-03 DIAGNOSIS — M545 Low back pain: Secondary | ICD-10-CM | POA: Diagnosis not present

## 2012-12-20 ENCOUNTER — Ambulatory Visit: Payer: Self-pay | Admitting: Family Medicine

## 2012-12-20 DIAGNOSIS — R011 Cardiac murmur, unspecified: Secondary | ICD-10-CM | POA: Diagnosis not present

## 2012-12-20 DIAGNOSIS — I6529 Occlusion and stenosis of unspecified carotid artery: Secondary | ICD-10-CM | POA: Diagnosis not present

## 2012-12-22 DIAGNOSIS — E669 Obesity, unspecified: Secondary | ICD-10-CM | POA: Diagnosis not present

## 2012-12-22 DIAGNOSIS — R0989 Other specified symptoms and signs involving the circulatory and respiratory systems: Secondary | ICD-10-CM | POA: Diagnosis not present

## 2012-12-22 DIAGNOSIS — R0609 Other forms of dyspnea: Secondary | ICD-10-CM | POA: Diagnosis not present

## 2012-12-22 DIAGNOSIS — G4733 Obstructive sleep apnea (adult) (pediatric): Secondary | ICD-10-CM | POA: Diagnosis not present

## 2012-12-22 DIAGNOSIS — J449 Chronic obstructive pulmonary disease, unspecified: Secondary | ICD-10-CM | POA: Diagnosis not present

## 2012-12-23 ENCOUNTER — Ambulatory Visit: Payer: Self-pay | Admitting: Family Medicine

## 2012-12-23 DIAGNOSIS — R011 Cardiac murmur, unspecified: Secondary | ICD-10-CM

## 2012-12-27 DIAGNOSIS — M5430 Sciatica, unspecified side: Secondary | ICD-10-CM | POA: Diagnosis not present

## 2012-12-27 DIAGNOSIS — I872 Venous insufficiency (chronic) (peripheral): Secondary | ICD-10-CM | POA: Diagnosis not present

## 2012-12-27 DIAGNOSIS — M79609 Pain in unspecified limb: Secondary | ICD-10-CM | POA: Diagnosis not present

## 2012-12-27 DIAGNOSIS — I87099 Postthrombotic syndrome with other complications of unspecified lower extremity: Secondary | ICD-10-CM | POA: Diagnosis not present

## 2013-01-03 DIAGNOSIS — I10 Essential (primary) hypertension: Secondary | ICD-10-CM | POA: Diagnosis not present

## 2013-01-03 DIAGNOSIS — M2540 Effusion, unspecified joint: Secondary | ICD-10-CM | POA: Diagnosis not present

## 2013-01-03 DIAGNOSIS — M259 Joint disorder, unspecified: Secondary | ICD-10-CM | POA: Diagnosis not present

## 2013-01-05 ENCOUNTER — Ambulatory Visit: Payer: Self-pay | Admitting: Vascular Surgery

## 2013-01-05 DIAGNOSIS — I6529 Occlusion and stenosis of unspecified carotid artery: Secondary | ICD-10-CM | POA: Diagnosis not present

## 2013-01-05 DIAGNOSIS — I6509 Occlusion and stenosis of unspecified vertebral artery: Secondary | ICD-10-CM | POA: Diagnosis not present

## 2013-01-11 DIAGNOSIS — S7000XA Contusion of unspecified hip, initial encounter: Secondary | ICD-10-CM | POA: Diagnosis not present

## 2013-01-11 DIAGNOSIS — M461 Sacroiliitis, not elsewhere classified: Secondary | ICD-10-CM | POA: Diagnosis not present

## 2013-01-12 DIAGNOSIS — R1310 Dysphagia, unspecified: Secondary | ICD-10-CM | POA: Diagnosis not present

## 2013-01-12 DIAGNOSIS — D509 Iron deficiency anemia, unspecified: Secondary | ICD-10-CM | POA: Diagnosis not present

## 2013-01-12 DIAGNOSIS — Z800 Family history of malignant neoplasm of digestive organs: Secondary | ICD-10-CM | POA: Diagnosis not present

## 2013-01-12 DIAGNOSIS — R112 Nausea with vomiting, unspecified: Secondary | ICD-10-CM | POA: Diagnosis not present

## 2013-01-14 DIAGNOSIS — I10 Essential (primary) hypertension: Secondary | ICD-10-CM | POA: Diagnosis not present

## 2013-01-14 DIAGNOSIS — M1610 Unilateral primary osteoarthritis, unspecified hip: Secondary | ICD-10-CM | POA: Diagnosis not present

## 2013-01-27 DIAGNOSIS — I10 Essential (primary) hypertension: Secondary | ICD-10-CM | POA: Diagnosis not present

## 2013-01-27 DIAGNOSIS — I70219 Atherosclerosis of native arteries of extremities with intermittent claudication, unspecified extremity: Secondary | ICD-10-CM | POA: Diagnosis not present

## 2013-01-27 DIAGNOSIS — I8310 Varicose veins of unspecified lower extremity with inflammation: Secondary | ICD-10-CM | POA: Diagnosis not present

## 2013-01-27 DIAGNOSIS — M79609 Pain in unspecified limb: Secondary | ICD-10-CM | POA: Diagnosis not present

## 2013-02-01 DIAGNOSIS — J449 Chronic obstructive pulmonary disease, unspecified: Secondary | ICD-10-CM | POA: Diagnosis not present

## 2013-02-01 DIAGNOSIS — R0989 Other specified symptoms and signs involving the circulatory and respiratory systems: Secondary | ICD-10-CM | POA: Diagnosis not present

## 2013-02-01 DIAGNOSIS — R0609 Other forms of dyspnea: Secondary | ICD-10-CM | POA: Diagnosis not present

## 2013-02-01 DIAGNOSIS — G4733 Obstructive sleep apnea (adult) (pediatric): Secondary | ICD-10-CM | POA: Diagnosis not present

## 2013-02-17 DIAGNOSIS — J343 Hypertrophy of nasal turbinates: Secondary | ICD-10-CM | POA: Diagnosis not present

## 2013-02-17 DIAGNOSIS — H6980 Other specified disorders of Eustachian tube, unspecified ear: Secondary | ICD-10-CM | POA: Diagnosis not present

## 2013-03-02 ENCOUNTER — Ambulatory Visit: Payer: Self-pay | Admitting: Gastroenterology

## 2013-03-02 DIAGNOSIS — K648 Other hemorrhoids: Secondary | ICD-10-CM | POA: Diagnosis not present

## 2013-03-02 DIAGNOSIS — K2970 Gastritis, unspecified, without bleeding: Secondary | ICD-10-CM | POA: Diagnosis not present

## 2013-03-02 DIAGNOSIS — Z86010 Personal history of colonic polyps: Secondary | ICD-10-CM | POA: Diagnosis not present

## 2013-03-02 DIAGNOSIS — R1310 Dysphagia, unspecified: Secondary | ICD-10-CM | POA: Diagnosis not present

## 2013-03-02 DIAGNOSIS — I2510 Atherosclerotic heart disease of native coronary artery without angina pectoris: Secondary | ICD-10-CM | POA: Diagnosis not present

## 2013-03-02 DIAGNOSIS — M1990 Unspecified osteoarthritis, unspecified site: Secondary | ICD-10-CM | POA: Diagnosis not present

## 2013-03-02 DIAGNOSIS — D509 Iron deficiency anemia, unspecified: Secondary | ICD-10-CM | POA: Diagnosis not present

## 2013-03-02 DIAGNOSIS — D126 Benign neoplasm of colon, unspecified: Secondary | ICD-10-CM | POA: Diagnosis not present

## 2013-03-02 DIAGNOSIS — I10 Essential (primary) hypertension: Secondary | ICD-10-CM | POA: Diagnosis not present

## 2013-03-02 DIAGNOSIS — K319 Disease of stomach and duodenum, unspecified: Secondary | ICD-10-CM | POA: Diagnosis not present

## 2013-03-02 DIAGNOSIS — D649 Anemia, unspecified: Secondary | ICD-10-CM | POA: Diagnosis not present

## 2013-03-02 DIAGNOSIS — E119 Type 2 diabetes mellitus without complications: Secondary | ICD-10-CM | POA: Diagnosis not present

## 2013-03-02 DIAGNOSIS — J449 Chronic obstructive pulmonary disease, unspecified: Secondary | ICD-10-CM | POA: Diagnosis not present

## 2013-03-02 DIAGNOSIS — K2960 Other gastritis without bleeding: Secondary | ICD-10-CM | POA: Diagnosis not present

## 2013-03-02 DIAGNOSIS — Z886 Allergy status to analgesic agent status: Secondary | ICD-10-CM | POA: Diagnosis not present

## 2013-03-02 DIAGNOSIS — K2990 Gastroduodenitis, unspecified, without bleeding: Secondary | ICD-10-CM | POA: Diagnosis not present

## 2013-03-07 DIAGNOSIS — E1149 Type 2 diabetes mellitus with other diabetic neurological complication: Secondary | ICD-10-CM | POA: Diagnosis not present

## 2013-03-07 DIAGNOSIS — E7800 Pure hypercholesterolemia, unspecified: Secondary | ICD-10-CM | POA: Diagnosis not present

## 2013-03-07 DIAGNOSIS — D649 Anemia, unspecified: Secondary | ICD-10-CM | POA: Diagnosis not present

## 2013-03-07 LAB — PATHOLOGY REPORT

## 2013-03-17 ENCOUNTER — Encounter: Payer: Self-pay | Admitting: Podiatry

## 2013-03-18 ENCOUNTER — Ambulatory Visit: Payer: Self-pay | Admitting: Podiatry

## 2013-03-28 ENCOUNTER — Ambulatory Visit: Payer: Self-pay | Admitting: Family Medicine

## 2013-03-28 DIAGNOSIS — E7800 Pure hypercholesterolemia, unspecified: Secondary | ICD-10-CM | POA: Diagnosis not present

## 2013-03-28 DIAGNOSIS — R5383 Other fatigue: Secondary | ICD-10-CM | POA: Diagnosis not present

## 2013-03-28 DIAGNOSIS — M2569 Stiffness of other specified joint, not elsewhere classified: Secondary | ICD-10-CM | POA: Diagnosis not present

## 2013-03-28 DIAGNOSIS — R51 Headache: Secondary | ICD-10-CM | POA: Diagnosis not present

## 2013-03-28 DIAGNOSIS — R5381 Other malaise: Secondary | ICD-10-CM | POA: Diagnosis not present

## 2013-03-28 DIAGNOSIS — M5030 Other cervical disc degeneration, unspecified cervical region: Secondary | ICD-10-CM | POA: Diagnosis not present

## 2013-03-28 DIAGNOSIS — IMO0001 Reserved for inherently not codable concepts without codable children: Secondary | ICD-10-CM | POA: Diagnosis not present

## 2013-03-28 DIAGNOSIS — E039 Hypothyroidism, unspecified: Secondary | ICD-10-CM | POA: Diagnosis not present

## 2013-03-28 DIAGNOSIS — M542 Cervicalgia: Secondary | ICD-10-CM | POA: Diagnosis not present

## 2013-04-04 DIAGNOSIS — R413 Other amnesia: Secondary | ICD-10-CM | POA: Diagnosis not present

## 2013-04-04 DIAGNOSIS — R439 Unspecified disturbances of smell and taste: Secondary | ICD-10-CM | POA: Diagnosis not present

## 2013-04-04 DIAGNOSIS — J3489 Other specified disorders of nose and nasal sinuses: Secondary | ICD-10-CM | POA: Diagnosis not present

## 2013-04-11 ENCOUNTER — Ambulatory Visit: Payer: Self-pay | Admitting: Family Medicine

## 2013-04-11 DIAGNOSIS — J3489 Other specified disorders of nose and nasal sinuses: Secondary | ICD-10-CM | POA: Diagnosis not present

## 2013-04-11 DIAGNOSIS — R439 Unspecified disturbances of smell and taste: Secondary | ICD-10-CM | POA: Diagnosis not present

## 2013-04-13 DIAGNOSIS — M48061 Spinal stenosis, lumbar region without neurogenic claudication: Secondary | ICD-10-CM | POA: Diagnosis not present

## 2013-04-26 ENCOUNTER — Emergency Department (HOSPITAL_COMMUNITY): Payer: Medicare Other

## 2013-04-26 ENCOUNTER — Encounter (HOSPITAL_COMMUNITY): Payer: Self-pay | Admitting: Emergency Medicine

## 2013-04-26 ENCOUNTER — Emergency Department (HOSPITAL_COMMUNITY)
Admission: EM | Admit: 2013-04-26 | Discharge: 2013-04-26 | Disposition: A | Discharge status: Home / Self Care | Payer: Medicare Other | Attending: Emergency Medicine | Admitting: Emergency Medicine

## 2013-04-26 DIAGNOSIS — I252 Old myocardial infarction: Secondary | ICD-10-CM | POA: Insufficient documentation

## 2013-04-26 DIAGNOSIS — R51 Headache: Secondary | ICD-10-CM | POA: Diagnosis not present

## 2013-04-26 DIAGNOSIS — E119 Type 2 diabetes mellitus without complications: Secondary | ICD-10-CM | POA: Insufficient documentation

## 2013-04-26 DIAGNOSIS — E7800 Pure hypercholesterolemia, unspecified: Secondary | ICD-10-CM | POA: Insufficient documentation

## 2013-04-26 DIAGNOSIS — F411 Generalized anxiety disorder: Secondary | ICD-10-CM | POA: Insufficient documentation

## 2013-04-26 DIAGNOSIS — J4489 Other specified chronic obstructive pulmonary disease: Secondary | ICD-10-CM | POA: Diagnosis not present

## 2013-04-26 DIAGNOSIS — W1809XA Striking against other object with subsequent fall, initial encounter: Secondary | ICD-10-CM | POA: Insufficient documentation

## 2013-04-26 DIAGNOSIS — Y92009 Unspecified place in unspecified non-institutional (private) residence as the place of occurrence of the external cause: Secondary | ICD-10-CM | POA: Diagnosis not present

## 2013-04-26 DIAGNOSIS — K219 Gastro-esophageal reflux disease without esophagitis: Secondary | ICD-10-CM | POA: Insufficient documentation

## 2013-04-26 DIAGNOSIS — Z79899 Other long term (current) drug therapy: Secondary | ICD-10-CM | POA: Insufficient documentation

## 2013-04-26 DIAGNOSIS — IMO0002 Reserved for concepts with insufficient information to code with codable children: Secondary | ICD-10-CM | POA: Insufficient documentation

## 2013-04-26 DIAGNOSIS — J449 Chronic obstructive pulmonary disease, unspecified: Secondary | ICD-10-CM | POA: Insufficient documentation

## 2013-04-26 DIAGNOSIS — Z7982 Long term (current) use of aspirin: Secondary | ICD-10-CM | POA: Diagnosis not present

## 2013-04-26 DIAGNOSIS — Z23 Encounter for immunization: Secondary | ICD-10-CM | POA: Diagnosis not present

## 2013-04-26 DIAGNOSIS — E890 Postprocedural hypothyroidism: Secondary | ICD-10-CM | POA: Diagnosis not present

## 2013-04-26 DIAGNOSIS — I10 Essential (primary) hypertension: Secondary | ICD-10-CM | POA: Diagnosis not present

## 2013-04-26 DIAGNOSIS — S0101XA Laceration without foreign body of scalp, initial encounter: Secondary | ICD-10-CM

## 2013-04-26 DIAGNOSIS — F329 Major depressive disorder, single episode, unspecified: Secondary | ICD-10-CM | POA: Diagnosis not present

## 2013-04-26 DIAGNOSIS — I2510 Atherosclerotic heart disease of native coronary artery without angina pectoris: Secondary | ICD-10-CM | POA: Insufficient documentation

## 2013-04-26 DIAGNOSIS — S0100XA Unspecified open wound of scalp, initial encounter: Secondary | ICD-10-CM | POA: Insufficient documentation

## 2013-04-26 DIAGNOSIS — Z794 Long term (current) use of insulin: Secondary | ICD-10-CM | POA: Insufficient documentation

## 2013-04-26 DIAGNOSIS — Y9301 Activity, walking, marching and hiking: Secondary | ICD-10-CM | POA: Insufficient documentation

## 2013-04-26 DIAGNOSIS — S0990XA Unspecified injury of head, initial encounter: Secondary | ICD-10-CM | POA: Diagnosis present

## 2013-04-26 MED ORDER — TETANUS-DIPHTH-ACELL PERTUSSIS 5-2.5-18.5 LF-MCG/0.5 IM SUSP
0.5000 mL | Freq: Once | INTRAMUSCULAR | Status: AC
  Administered 2013-04-26: 0.5 mL via INTRAMUSCULAR
  Filled 2013-04-26: qty 0.5

## 2013-04-26 MED ORDER — ACETAMINOPHEN 325 MG PO TABS
650.0000 mg | ORAL_TABLET | Freq: Once | ORAL | Status: AC
  Administered 2013-04-26: 650 mg via ORAL
  Filled 2013-04-26: qty 2

## 2013-04-26 NOTE — ED Notes (Signed)
Pt states she fell tonight and hit the back of her head.  Blood noted but bleeding controlled prior to arrival with pressure.  Pt denies LOC or dizziness.

## 2013-04-26 NOTE — ED Notes (Signed)
Pt wound was irrigated at bedside.  Pt is alert and oriented with family at the bedside.

## 2013-04-26 NOTE — Discharge Instructions (Signed)
Head Injury, Adult  You have received a head injury. It does not appear serious at this time. Headaches and vomiting are common following head injury. It should be easy to awaken from sleeping. Sometimes it is necessary for you to stay in the emergency department for a while for observation. Sometimes admission to the hospital may be needed. After injuries such as yours, most problems occur within the first 24 hours, but side effects may occur up to 7 10 days after the injury. It is important for you to carefully monitor your condition and contact your health care provider or seek immediate medical care if there is a change in your condition.  WHAT ARE THE TYPES OF HEAD INJURIES?  Head injuries can be as minor as a bump. Some head injuries can be more severe. More severe head injuries include:  · A jarring injury to the brain (concussion).  · A bruise of the brain (contusion). This mean there is bleeding in the brain that can cause swelling.  · A cracked skull (skull fracture).  · Bleeding in the brain that collects, clots, and forms a bump (hematoma).  WHAT CAUSES A HEAD INJURY?  A serious head injury is most likely to happen to someone who is in a car wreck and is not wearing a seat belt. Other causes of major head injuries include bicycle or motorcycle accidents, sports injuries, and falls.  HOW ARE HEAD INJURIES DIAGNOSED?  A complete history of the event leading to the injury and your current symptoms will be helpful in diagnosing head injuries. Many times, pictures of the brain, such as CT or MRI are needed to see the extent of the injury. Often, an overnight hospital stay is necessary for observation.   WHEN SHOULD I SEEK IMMEDIATE MEDICAL CARE?   You should get help right away if:  · You have confusion or drowsiness.  · You feel sick to your stomach (nauseous) or have continued, forceful vomiting.  · You have dizziness or unsteadiness that is getting worse.  · You have severe, continued headaches not  relieved by medicine. Only take over-the-counter or prescription medicines for pain, fever, or discomfort as directed by your health care provider.  · You do not have normal function of the arms or legs or are unable to walk.  · You notice changes in the black spots in the center of the colored part of your eye (pupil).  · You have a clear or bloody fluid coming from your nose or ears.  · You have a loss of vision.  During the next 24 hours after the injury, you must stay with someone who can watch you for the warning signs. This person should contact local emergency services (911 in the [place] if you have seizures, you become unconscious, or you are unable to wake up.  HOW CAN I PREVENT A HEAD INJURY IN THE FUTURE?  The most important factor for preventing major head injuries is avoiding motor vehicle accidents.  To minimize the potential for damage to your head, it is crucial to wear seat belts while riding in motor vehicles. Wearing helmets while bike riding and playing collision sports (like football) is also helpful. Also, avoiding dangerous activities around the house will further help reduce your risk of head injury.   WHEN CAN I RETURN TO NORMAL ACTIVITIES AND ATHLETICS?  You should be reevaluated by your health care provider before returning to these activities. If you have any of the following symptoms, you should not return   to activities or contact sports until 1 week after the symptoms have stopped:  · Persistent headache.  · Dizziness or vertigo.  · Poor attention and concentration.  · Confusion.  · Memory problems.  · Nausea or vomiting.  · Fatigue or tire easily.  · Irritability.  · Intolerant of bright lights or loud noises.  · Anxiety or depression.  · Disturbed sleep.  MAKE SURE YOU:   · Understand these instructions.  · Will watch your condition.  · Will get help right away if you are not doing well or get worse.  Document Released: 12/23/2004 Document Revised: 10/13/2012 Document Reviewed:  08/30/2012  [employer]® Patient Information ©2014 [employer].

## 2013-04-26 NOTE — ED Provider Notes (Signed)
CSN: [id number]     Arrival date & time 04/26/13  0002 History   First MD Initiated Contact with Patient 04/26/13 0151     Chief Complaint  Patient presents with  . Fall  . Headache     (Consider location/radiation/quality/duration/timing/severity/associated sxs/prior Treatment) HPI History provided by patient and family bedside. Lives at home with family, was walking from the bathroom into the living room and lost her balance, falling backwards and striking her head on the entertainment center. No LOC. She sustained a laceration with bleeding controlled prior to arrival. She denies any neck pain, weakness, numbness or tingling. She denies any other pain or trauma. No upper extremity injuries. No difficulty with her gait since event. Patient states that for the last year or so she has had difficulty with her balance at home. She has talked with her primary care physician about this and reviewed her medications. She denies any chest pain, shortness of breath, headache otherwise. No dizziness or lightheadedness or near-syncope.  Past Medical History  Diagnosis Date  . Diabetes   . Hypertension   . GERD (gastroesophageal reflux disease)   . CAD (coronary artery disease)   . History of heart attack   . Hypercholesteremia   . COPD (chronic obstructive pulmonary disease)   . Morbid obesity   . Sleep apnea   . Anxiety disorder   . Post-surgical hypothyroidism   . Depression, major    Past Surgical History  Procedure Laterality Date  . Bartholin cyst marsupialization    . Trigger finger repair    . Cataract extraction with iol    . Left knee replacement    . Total hip arthroplasty Bilateral   . Thyroidectomy, partial    . Right shoulder repaired    . Toe and foot repair Right   . Tonsillectomy    . Carotid endarterectomy  2009   Family History  Problem Relation Age of Onset  . Arthritis Mother   . Cancer Mother   . Diabetes Mother   . Heart disease Mother   . Hyperlipidemia  Mother   . Hypertension Mother   . Alcohol abuse Father   . Alcohol abuse Brother   . Arthritis Brother   . Asthma Brother   . HIV Brother   . Cancer Brother   . Diabetes Brother   . Hyperlipidemia Brother   . Hypertension Brother   . Lung disease Brother   . Arthritis Maternal Grandmother    History  Substance Use Topics  . Smoking status: Never Smoker   . Smokeless tobacco: Not on file  . Alcohol Use: No   OB History   Grav Para Term Preterm Abortions TAB SAB Ect Mult Living                 Review of Systems  Constitutional: Negative for fever and chills.  Eyes: Negative for visual disturbance.  Respiratory: Negative for shortness of breath.   Cardiovascular: Negative for chest pain.  Gastrointestinal: Negative for abdominal pain.  Genitourinary: Negative for dysuria.  Musculoskeletal: Negative for back pain, neck pain and neck stiffness.  Skin: Positive for wound.  Neurological: Negative for seizures, speech difficulty and weakness.  All other systems reviewed and are negative.     Allergies  Enalapril  Home Medications   Prior to Admission medications   Medication Sig Start Date End Date Taking? Authorizing Provider  albuterol (PROAIR HFA) 108 (90 BASE) MCG/ACT inhaler Inhale 2 puffs into the lungs every 4 (four) hours  as needed for wheezing or shortness of breath.    Yes Historical Provider, MD  amLODipine (NORVASC) 10 MG tablet Take 10 mg by mouth daily.   Yes Historical Provider, MD  aspirin EC 81 MG tablet Take 81 mg by mouth daily.   Yes Historical Provider, MD  atorvastatin (LIPITOR) 20 MG tablet Take 20 mg by mouth daily.   Yes Historical Provider, MD  budesonide-formoterol (SYMBICORT) 160-4.5 MCG/ACT inhaler Inhale 2 puffs into the lungs 2 (two) times daily. RINSE MOUTH AFTER EACH USE   Yes Historical Provider, MD  bumetanide (BUMEX) 1 MG tablet Take 1 mg by mouth daily.   Yes Historical Provider, MD  busPIRone (BUSPAR) 15 MG tablet Take 15 mg by mouth  2 (two) times daily.   Yes Historical Provider, MD  carvedilol (COREG) 12.5 MG tablet Take 12.5 mg by mouth 2 (two) times daily with a meal.   Yes Historical Provider, MD  insulin aspart (NOVOLOG) 100 UNIT/ML injection Inject 5 Units into the skin daily as needed (if sugar is up take 5 units).    Yes Historical Provider, MD  insulin glargine (LANTUS) 100 UNIT/ML injection Inject 40 Units into the skin daily.   Yes Historical Provider, MD  levothyroxine (SYNTHROID, LEVOTHROID) 125 MCG tablet Take 125 mcg by mouth daily before breakfast.   Yes Historical Provider, MD  montelukast (SINGULAIR) 10 MG tablet Take 10 mg by mouth at bedtime.   Yes Historical Provider, MD  Omega-3 Fatty Acids (FISH OIL) 1000 MG CAPS Take 1 capsule by mouth daily.    Yes Historical Provider, MD  omeprazole (PRILOSEC) 20 MG capsule Take 20 mg by mouth daily.   Yes Historical Provider, MD  PRESCRIPTION MEDICATION Take 1 tablet by mouth daily. iron   Yes Historical Provider, MD  rOPINIRole (REQUIP) 1 MG tablet Take 1 mg by mouth 3 (three) times daily.   Yes Historical Provider, MD  sertraline (ZOLOFT) 100 MG tablet Take 100 mg by mouth daily.   Yes Historical Provider, MD  spironolactone (ALDACTONE) 25 MG tablet Take 25 mg by mouth daily.   Yes Historical Provider, MD  tiotropium (SPIRIVA HANDIHALER) 18 MCG inhalation capsule Place 18 mcg into inhaler and inhale daily.   Yes Historical Provider, MD   BP 141/50  Pulse 67  Temp(Src) 97.7 F (36.5 C) (Oral)  Resp 18  SpO2 97% Physical Exam  Constitutional: She is oriented to person, place, and time. She appears well-developed and well-nourished.  HENT:  Approximately 3 cm curvilinear full thickness laceration posterior scalp. Hemostatic. No bony deformity. No epistaxis. TMs clear.  Eyes: EOM are normal. Pupils are equal, round, and reactive to light.  Neck: Neck supple.  No cervical spine tenderness or deformity  Cardiovascular: Normal rate, regular rhythm and intact  distal pulses.   Pulmonary/Chest: Effort normal and breath sounds normal. No respiratory distress.  Abdominal: Soft. Bowel sounds are normal. She exhibits no distension. There is no tenderness.  Musculoskeletal: Normal range of motion. She exhibits no edema.  Neurological: She is alert and oriented to person, place, and time. No cranial nerve deficit.  Speech clear, no pronator drift, equal grips, biceps and triceps strength. Gait intact - does not require assistance to ambulate.  Skin: Skin is warm and dry.    ED Course  LACERATION REPAIR Date/Time: 04/26/2013 3:56 AM Performed by: Teressa Lower Authorized by: Teressa Lower Consent: Verbal consent obtained. Risks and benefits: risks, benefits and alternatives were discussed Consent given by: patient Patient understanding: patient states understanding  of the procedure being performed Patient consent: the patient's understanding of the procedure matches consent given Procedure consent: procedure consent matches procedure scheduled Required items: required blood products, implants, devices, and special equipment available Patient identity confirmed: verbally with patient Time out: Immediately prior to procedure a "time out" was called to verify the correct patient, procedure, equipment, support staff and site/side marked as required. Body area: head/neck Location details: scalp Laceration length: 3 cm Foreign bodies: no foreign bodies Anesthesia: local infiltration Local anesthetic: lidocaine 1% with epinephrine Anesthetic total: 4 ml Preparation: Patient was prepped and draped in the usual sterile fashion. Irrigation solution: saline Irrigation method: syringe Amount of cleaning: standard Skin closure: staples Number of sutures: 3 Technique: simple Approximation: close Approximation difficulty: simple Dressing: antibiotic ointment Patient tolerance: Patient tolerated the procedure well with no immediate complications.    (including critical care time) Labs Review Labs Reviewed - No data to display  Imaging Review Ct Head Wo Contrast  04/26/2013   CLINICAL DATA:  Patient fell tonight and struck the back of head. Headache.  EXAM: CT HEAD WITHOUT CONTRAST  TECHNIQUE: Contiguous axial images were obtained from the base of the skull through the vertex without intravenous contrast.  COMPARISON:  CT PARANASAL SINUSES W/O CM dated 04/11/2013  FINDINGS: Subcutaneous scalp hematoma over the posterior skull base. Mild diffuse cerebral atrophy. Low-attenuation changes throughout the deep white matter consistent with small vessel ischemia. Ventricular dilatation consistent with central atrophy. No mass effect or midline shift. No abnormal extra-axial fluid collections. Gray-white matter junctions are distinct. Basal cisterns are not effaced. No evidence of acute intracranial hemorrhage. No depressed skull fractures. Visualized paranasal sinuses and mastoid air cells are not opacified.  IMPRESSION: No acute intracranial abnormalities. 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   Electronically Signed   By: Lucienne Capers M.D.   On: 04/26/2013 00:47   Wound care provided. Tetanus updated. Wound irrigated extensively and repaired as above.   Plan discharge home with family, close primary care followup for recheck and review of medications. Patient family agreed to return precautions. They were provided close head injury precautions, infection precautions. Plan staple removal 7-10 days.   MDM   Final diagnoses:  Scalp laceration   Elderly female with fall at home. Patient and her family, that she has regular falls and loses her balance like this quite frequently. She sustained a laceration which was repaired. A CT scan was obtained and reviewed as above. No intracranial bleed. Patient her family prefer to be discharged home and followup with primary care physician regarding frequent falls. I feel that she is reliable and  considered to be discharged at this time with outpatient followup. She has no neuro deficits on serial exams. She declines admission or further workup today in the ER.   Teressa Lower, MD 04/28/13 [phone]

## 2013-05-03 DIAGNOSIS — E1049 Type 1 diabetes mellitus with other diabetic neurological complication: Secondary | ICD-10-CM | POA: Diagnosis not present

## 2013-05-06 DIAGNOSIS — L02818 Cutaneous abscess of other sites: Secondary | ICD-10-CM | POA: Diagnosis not present

## 2013-05-06 DIAGNOSIS — Z4802 Encounter for removal of sutures: Secondary | ICD-10-CM | POA: Diagnosis not present

## 2013-05-09 ENCOUNTER — Ambulatory Visit: Payer: Medicare Other | Admitting: Podiatry

## 2013-06-01 ENCOUNTER — Emergency Department: Payer: Self-pay | Admitting: Emergency Medicine

## 2013-06-01 DIAGNOSIS — I10 Essential (primary) hypertension: Secondary | ICD-10-CM | POA: Diagnosis not present

## 2013-06-01 DIAGNOSIS — Z95818 Presence of other cardiac implants and grafts: Secondary | ICD-10-CM | POA: Diagnosis not present

## 2013-06-01 DIAGNOSIS — K2900 Acute gastritis without bleeding: Secondary | ICD-10-CM | POA: Diagnosis not present

## 2013-06-01 DIAGNOSIS — R9431 Abnormal electrocardiogram [ECG] [EKG]: Secondary | ICD-10-CM | POA: Diagnosis not present

## 2013-06-01 DIAGNOSIS — E119 Type 2 diabetes mellitus without complications: Secondary | ICD-10-CM | POA: Diagnosis not present

## 2013-06-01 DIAGNOSIS — R0789 Other chest pain: Secondary | ICD-10-CM | POA: Diagnosis not present

## 2013-06-01 DIAGNOSIS — Z888 Allergy status to other drugs, medicaments and biological substances status: Secondary | ICD-10-CM | POA: Diagnosis not present

## 2013-06-01 DIAGNOSIS — S0990XA Unspecified injury of head, initial encounter: Secondary | ICD-10-CM | POA: Diagnosis not present

## 2013-06-01 DIAGNOSIS — K2970 Gastritis, unspecified, without bleeding: Secondary | ICD-10-CM | POA: Diagnosis not present

## 2013-06-01 DIAGNOSIS — K219 Gastro-esophageal reflux disease without esophagitis: Secondary | ICD-10-CM | POA: Diagnosis not present

## 2013-06-01 DIAGNOSIS — R51 Headache: Secondary | ICD-10-CM | POA: Diagnosis not present

## 2013-06-01 DIAGNOSIS — Z79899 Other long term (current) drug therapy: Secondary | ICD-10-CM | POA: Diagnosis not present

## 2013-06-01 LAB — CBC
HCT: 38.9 % (ref 35.0–47.0)
HGB: 12.8 g/dL (ref 12.0–16.0)
MCH: 27.8 pg (ref 26.0–34.0)
MCHC: 32.8 g/dL (ref 32.0–36.0)
MCV: 85 fL (ref 80–100)
PLATELETS: 180 10*3/uL (ref 150–440)
RBC: 4.6 10*6/uL (ref 3.80–5.20)
RDW: 15 % — ABNORMAL HIGH (ref 11.5–14.5)
WBC: 8.2 10*3/uL (ref 3.6–11.0)

## 2013-06-01 LAB — BASIC METABOLIC PANEL
ANION GAP: 8 (ref 7–16)
BUN: 16 mg/dL (ref 7–18)
CHLORIDE: 103 mmol/L (ref 98–107)
CO2: 27 mmol/L (ref 21–32)
Calcium, Total: 9.1 mg/dL (ref 8.5–10.1)
Creatinine: 0.85 mg/dL (ref 0.60–1.30)
EGFR (African American): >60
Glucose: 115 mg/dL — ABNORMAL HIGH (ref 65–99)
Osmolality: 278 (ref 275–301)
Potassium: 3.7 mmol/L (ref 3.5–5.1)
Sodium: 138 mmol/L (ref 136–145)

## 2013-06-01 LAB — TROPONIN I

## 2013-06-01 LAB — LIPASE, BLOOD: Lipase: 135 U/L (ref 73–393)

## 2013-06-29 DIAGNOSIS — E1149 Type 2 diabetes mellitus with other diabetic neurological complication: Secondary | ICD-10-CM | POA: Diagnosis not present

## 2013-06-29 DIAGNOSIS — F339 Major depressive disorder, recurrent, unspecified: Secondary | ICD-10-CM | POA: Diagnosis not present

## 2013-06-29 DIAGNOSIS — E785 Hyperlipidemia, unspecified: Secondary | ICD-10-CM | POA: Diagnosis not present

## 2013-06-29 DIAGNOSIS — D649 Anemia, unspecified: Secondary | ICD-10-CM | POA: Diagnosis not present

## 2013-07-05 DIAGNOSIS — R230 Cyanosis: Secondary | ICD-10-CM | POA: Diagnosis not present

## 2013-07-05 DIAGNOSIS — E785 Hyperlipidemia, unspecified: Secondary | ICD-10-CM | POA: Diagnosis not present

## 2013-07-05 DIAGNOSIS — I10 Essential (primary) hypertension: Secondary | ICD-10-CM | POA: Diagnosis not present

## 2013-07-06 DIAGNOSIS — I771 Stricture of artery: Secondary | ICD-10-CM | POA: Diagnosis not present

## 2013-07-06 DIAGNOSIS — E119 Type 2 diabetes mellitus without complications: Secondary | ICD-10-CM | POA: Diagnosis not present

## 2013-07-06 DIAGNOSIS — I6529 Occlusion and stenosis of unspecified carotid artery: Secondary | ICD-10-CM | POA: Diagnosis not present

## 2013-07-06 DIAGNOSIS — I743 Embolism and thrombosis of arteries of the lower extremities: Secondary | ICD-10-CM | POA: Diagnosis not present

## 2013-07-15 DIAGNOSIS — J3489 Other specified disorders of nose and nasal sinuses: Secondary | ICD-10-CM | POA: Diagnosis not present

## 2013-07-15 DIAGNOSIS — R1011 Right upper quadrant pain: Secondary | ICD-10-CM | POA: Diagnosis not present

## 2013-07-15 DIAGNOSIS — R110 Nausea: Secondary | ICD-10-CM | POA: Diagnosis not present

## 2013-07-15 DIAGNOSIS — R109 Unspecified abdominal pain: Secondary | ICD-10-CM | POA: Diagnosis not present

## 2013-07-19 ENCOUNTER — Ambulatory Visit: Payer: Self-pay | Admitting: Vascular Surgery

## 2013-07-19 DIAGNOSIS — E119 Type 2 diabetes mellitus without complications: Secondary | ICD-10-CM | POA: Diagnosis not present

## 2013-07-19 DIAGNOSIS — E785 Hyperlipidemia, unspecified: Secondary | ICD-10-CM | POA: Diagnosis not present

## 2013-07-19 DIAGNOSIS — I743 Embolism and thrombosis of arteries of the lower extremities: Secondary | ICD-10-CM | POA: Diagnosis not present

## 2013-07-19 DIAGNOSIS — I499 Cardiac arrhythmia, unspecified: Secondary | ICD-10-CM | POA: Diagnosis not present

## 2013-07-19 DIAGNOSIS — Z794 Long term (current) use of insulin: Secondary | ICD-10-CM | POA: Diagnosis not present

## 2013-07-19 DIAGNOSIS — Z9889 Other specified postprocedural states: Secondary | ICD-10-CM | POA: Diagnosis not present

## 2013-07-19 DIAGNOSIS — J449 Chronic obstructive pulmonary disease, unspecified: Secondary | ICD-10-CM | POA: Diagnosis not present

## 2013-07-19 DIAGNOSIS — I6529 Occlusion and stenosis of unspecified carotid artery: Secondary | ICD-10-CM | POA: Diagnosis not present

## 2013-07-19 DIAGNOSIS — I252 Old myocardial infarction: Secondary | ICD-10-CM | POA: Diagnosis not present

## 2013-07-19 DIAGNOSIS — I771 Stricture of artery: Secondary | ICD-10-CM | POA: Diagnosis not present

## 2013-07-19 DIAGNOSIS — I10 Essential (primary) hypertension: Secondary | ICD-10-CM | POA: Diagnosis not present

## 2013-07-19 DIAGNOSIS — Z79899 Other long term (current) drug therapy: Secondary | ICD-10-CM | POA: Diagnosis not present

## 2013-07-19 DIAGNOSIS — I70209 Unspecified atherosclerosis of native arteries of extremities, unspecified extremity: Secondary | ICD-10-CM | POA: Diagnosis not present

## 2013-07-19 DIAGNOSIS — R230 Cyanosis: Secondary | ICD-10-CM | POA: Diagnosis not present

## 2013-07-19 LAB — BASIC METABOLIC PANEL
ANION GAP: 5 — AB (ref 7–16)
BUN: 14 mg/dL (ref 7–18)
CREATININE: 1.05 mg/dL (ref 0.60–1.30)
Calcium, Total: 9.1 mg/dL (ref 8.5–10.1)
Chloride: 102 mmol/L (ref 98–107)
Co2: 33 mmol/L — ABNORMAL HIGH (ref 21–32)
EGFR (Non-African Amer.): 53 — ABNORMAL LOW
GLUCOSE: 139 mg/dL — AB (ref 65–99)
Osmolality: 282 (ref 275–301)
Potassium: 3.9 mmol/L (ref 3.5–5.1)
Sodium: 140 mmol/L (ref 136–145)

## 2013-07-20 DIAGNOSIS — L84 Corns and callosities: Secondary | ICD-10-CM | POA: Diagnosis not present

## 2013-07-20 DIAGNOSIS — I872 Venous insufficiency (chronic) (peripheral): Secondary | ICD-10-CM | POA: Diagnosis not present

## 2013-07-20 DIAGNOSIS — L608 Other nail disorders: Secondary | ICD-10-CM | POA: Diagnosis not present

## 2013-07-20 DIAGNOSIS — E1059 Type 1 diabetes mellitus with other circulatory complications: Secondary | ICD-10-CM | POA: Diagnosis not present

## 2013-07-20 DIAGNOSIS — I739 Peripheral vascular disease, unspecified: Secondary | ICD-10-CM | POA: Diagnosis not present

## 2013-07-21 DIAGNOSIS — K219 Gastro-esophageal reflux disease without esophagitis: Secondary | ICD-10-CM | POA: Diagnosis not present

## 2013-07-21 DIAGNOSIS — R002 Palpitations: Secondary | ICD-10-CM | POA: Diagnosis not present

## 2013-07-21 DIAGNOSIS — I10 Essential (primary) hypertension: Secondary | ICD-10-CM | POA: Diagnosis not present

## 2013-07-21 DIAGNOSIS — E119 Type 2 diabetes mellitus without complications: Secondary | ICD-10-CM | POA: Diagnosis not present

## 2013-07-25 ENCOUNTER — Ambulatory Visit: Payer: Self-pay | Admitting: Family Medicine

## 2013-07-25 DIAGNOSIS — R110 Nausea: Secondary | ICD-10-CM | POA: Diagnosis not present

## 2013-07-25 DIAGNOSIS — R1011 Right upper quadrant pain: Secondary | ICD-10-CM | POA: Diagnosis not present

## 2013-07-25 DIAGNOSIS — R109 Unspecified abdominal pain: Secondary | ICD-10-CM | POA: Diagnosis not present

## 2013-07-25 DIAGNOSIS — R141 Gas pain: Secondary | ICD-10-CM | POA: Diagnosis not present

## 2013-07-25 DIAGNOSIS — R143 Flatulence: Secondary | ICD-10-CM | POA: Diagnosis not present

## 2013-07-26 DIAGNOSIS — E2839 Other primary ovarian failure: Secondary | ICD-10-CM | POA: Diagnosis not present

## 2013-07-26 DIAGNOSIS — M899 Disorder of bone, unspecified: Secondary | ICD-10-CM | POA: Diagnosis not present

## 2013-07-26 DIAGNOSIS — Z780 Asymptomatic menopausal state: Secondary | ICD-10-CM | POA: Diagnosis not present

## 2013-07-26 DIAGNOSIS — Z13820 Encounter for screening for osteoporosis: Secondary | ICD-10-CM | POA: Diagnosis not present

## 2013-07-26 DIAGNOSIS — M949 Disorder of cartilage, unspecified: Secondary | ICD-10-CM | POA: Diagnosis not present

## 2013-07-27 DIAGNOSIS — E669 Obesity, unspecified: Secondary | ICD-10-CM | POA: Diagnosis not present

## 2013-07-27 DIAGNOSIS — J449 Chronic obstructive pulmonary disease, unspecified: Secondary | ICD-10-CM | POA: Insufficient documentation

## 2013-07-27 DIAGNOSIS — R0602 Shortness of breath: Secondary | ICD-10-CM | POA: Diagnosis not present

## 2013-07-27 DIAGNOSIS — R0609 Other forms of dyspnea: Secondary | ICD-10-CM | POA: Diagnosis not present

## 2013-07-27 DIAGNOSIS — R0989 Other specified symptoms and signs involving the circulatory and respiratory systems: Secondary | ICD-10-CM | POA: Diagnosis not present

## 2013-07-27 DIAGNOSIS — G4730 Sleep apnea, unspecified: Secondary | ICD-10-CM | POA: Diagnosis not present

## 2013-08-03 DIAGNOSIS — J449 Chronic obstructive pulmonary disease, unspecified: Secondary | ICD-10-CM | POA: Diagnosis not present

## 2013-08-03 DIAGNOSIS — I10 Essential (primary) hypertension: Secondary | ICD-10-CM | POA: Diagnosis not present

## 2013-08-03 DIAGNOSIS — K219 Gastro-esophageal reflux disease without esophagitis: Secondary | ICD-10-CM | POA: Diagnosis not present

## 2013-08-03 DIAGNOSIS — I499 Cardiac arrhythmia, unspecified: Secondary | ICD-10-CM | POA: Diagnosis not present

## 2013-08-04 DIAGNOSIS — N183 Chronic kidney disease, stage 3 unspecified: Secondary | ICD-10-CM | POA: Diagnosis not present

## 2013-08-04 DIAGNOSIS — I10 Essential (primary) hypertension: Secondary | ICD-10-CM | POA: Diagnosis not present

## 2013-08-04 DIAGNOSIS — E119 Type 2 diabetes mellitus without complications: Secondary | ICD-10-CM | POA: Diagnosis not present

## 2013-08-04 DIAGNOSIS — E7800 Pure hypercholesterolemia, unspecified: Secondary | ICD-10-CM | POA: Diagnosis not present

## 2013-08-04 DIAGNOSIS — E785 Hyperlipidemia, unspecified: Secondary | ICD-10-CM | POA: Diagnosis not present

## 2013-08-09 ENCOUNTER — Ambulatory Visit: Payer: Self-pay | Admitting: Specialist

## 2013-08-09 DIAGNOSIS — G4761 Periodic limb movement disorder: Secondary | ICD-10-CM | POA: Diagnosis not present

## 2013-08-09 DIAGNOSIS — G4733 Obstructive sleep apnea (adult) (pediatric): Secondary | ICD-10-CM | POA: Diagnosis not present

## 2013-08-09 DIAGNOSIS — G4710 Hypersomnia, unspecified: Secondary | ICD-10-CM | POA: Diagnosis not present

## 2013-08-09 DIAGNOSIS — G4730 Sleep apnea, unspecified: Secondary | ICD-10-CM | POA: Diagnosis not present

## 2013-08-09 DIAGNOSIS — R0609 Other forms of dyspnea: Secondary | ICD-10-CM | POA: Diagnosis not present

## 2013-08-09 DIAGNOSIS — G478 Other sleep disorders: Secondary | ICD-10-CM | POA: Diagnosis not present

## 2013-08-12 DIAGNOSIS — J029 Acute pharyngitis, unspecified: Secondary | ICD-10-CM | POA: Diagnosis not present

## 2013-08-12 DIAGNOSIS — S139XXA Sprain of joints and ligaments of unspecified parts of neck, initial encounter: Secondary | ICD-10-CM | POA: Diagnosis not present

## 2013-08-17 DIAGNOSIS — R002 Palpitations: Secondary | ICD-10-CM | POA: Diagnosis not present

## 2013-08-18 DIAGNOSIS — E785 Hyperlipidemia, unspecified: Secondary | ICD-10-CM | POA: Diagnosis not present

## 2013-08-18 DIAGNOSIS — I6529 Occlusion and stenosis of unspecified carotid artery: Secondary | ICD-10-CM | POA: Diagnosis not present

## 2013-08-18 DIAGNOSIS — I10 Essential (primary) hypertension: Secondary | ICD-10-CM | POA: Diagnosis not present

## 2013-08-18 DIAGNOSIS — M79609 Pain in unspecified limb: Secondary | ICD-10-CM | POA: Diagnosis not present

## 2013-09-01 ENCOUNTER — Emergency Department: Payer: Self-pay | Admitting: Emergency Medicine

## 2013-09-01 DIAGNOSIS — I120 Hypertensive chronic kidney disease with stage 5 chronic kidney disease or end stage renal disease: Secondary | ICD-10-CM | POA: Diagnosis not present

## 2013-09-01 DIAGNOSIS — Z87891 Personal history of nicotine dependence: Secondary | ICD-10-CM | POA: Diagnosis not present

## 2013-09-01 DIAGNOSIS — S99919A Unspecified injury of unspecified ankle, initial encounter: Secondary | ICD-10-CM | POA: Diagnosis not present

## 2013-09-01 DIAGNOSIS — S8990XA Unspecified injury of unspecified lower leg, initial encounter: Secondary | ICD-10-CM | POA: Diagnosis not present

## 2013-09-01 DIAGNOSIS — S59909A Unspecified injury of unspecified elbow, initial encounter: Secondary | ICD-10-CM | POA: Diagnosis not present

## 2013-09-01 DIAGNOSIS — S7000XA Contusion of unspecified hip, initial encounter: Secondary | ICD-10-CM | POA: Diagnosis not present

## 2013-09-01 DIAGNOSIS — S0993XA Unspecified injury of face, initial encounter: Secondary | ICD-10-CM | POA: Diagnosis not present

## 2013-09-01 DIAGNOSIS — E119 Type 2 diabetes mellitus without complications: Secondary | ICD-10-CM | POA: Diagnosis not present

## 2013-09-01 DIAGNOSIS — Z96649 Presence of unspecified artificial hip joint: Secondary | ICD-10-CM | POA: Diagnosis not present

## 2013-09-01 DIAGNOSIS — S0003XA Contusion of scalp, initial encounter: Secondary | ICD-10-CM | POA: Diagnosis not present

## 2013-09-01 DIAGNOSIS — IMO0002 Reserved for concepts with insufficient information to code with codable children: Secondary | ICD-10-CM | POA: Diagnosis not present

## 2013-09-01 DIAGNOSIS — Z79899 Other long term (current) drug therapy: Secondary | ICD-10-CM | POA: Diagnosis not present

## 2013-09-01 DIAGNOSIS — M25559 Pain in unspecified hip: Secondary | ICD-10-CM | POA: Diagnosis not present

## 2013-09-01 DIAGNOSIS — S199XXA Unspecified injury of neck, initial encounter: Secondary | ICD-10-CM | POA: Diagnosis not present

## 2013-09-01 DIAGNOSIS — S79919A Unspecified injury of unspecified hip, initial encounter: Secondary | ICD-10-CM | POA: Diagnosis not present

## 2013-09-01 DIAGNOSIS — S79929A Unspecified injury of unspecified thigh, initial encounter: Secondary | ICD-10-CM | POA: Diagnosis not present

## 2013-09-01 DIAGNOSIS — S139XXA Sprain of joints and ligaments of unspecified parts of neck, initial encounter: Secondary | ICD-10-CM | POA: Diagnosis not present

## 2013-09-01 DIAGNOSIS — Z7982 Long term (current) use of aspirin: Secondary | ICD-10-CM | POA: Diagnosis not present

## 2013-09-01 DIAGNOSIS — N186 End stage renal disease: Secondary | ICD-10-CM | POA: Diagnosis not present

## 2013-09-01 DIAGNOSIS — S0990XA Unspecified injury of head, initial encounter: Secondary | ICD-10-CM | POA: Diagnosis not present

## 2013-09-01 DIAGNOSIS — Z794 Long term (current) use of insulin: Secondary | ICD-10-CM | POA: Diagnosis not present

## 2013-09-01 LAB — URINALYSIS, COMPLETE
BLOOD: NEGATIVE
Bacteria: NONE SEEN
Bilirubin,UR: NEGATIVE
Hyaline Cast: 2
Ketone: NEGATIVE
Leukocyte Esterase: NEGATIVE
Nitrite: NEGATIVE
Ph: 5 (ref 4.5–8.0)
Protein: NEGATIVE
SQUAMOUS EPITHELIAL: NONE SEEN
Specific Gravity: 1.004 (ref 1.003–1.030)

## 2013-09-01 LAB — CBC
HCT: 36.1 % (ref 35.0–47.0)
HGB: 11.6 g/dL — ABNORMAL LOW (ref 12.0–16.0)
MCH: 27.2 pg (ref 26.0–34.0)
MCHC: 32.1 g/dL (ref 32.0–36.0)
MCV: 85 fL (ref 80–100)
Platelet: 178 10*3/uL (ref 150–440)
RBC: 4.26 10*6/uL (ref 3.80–5.20)
RDW: 15.1 % — ABNORMAL HIGH (ref 11.5–14.5)
WBC: 8 10*3/uL (ref 3.6–11.0)

## 2013-09-01 LAB — COMPREHENSIVE METABOLIC PANEL
ANION GAP: 8 (ref 7–16)
AST: 34 U/L (ref 15–37)
Albumin: 3.6 g/dL (ref 3.4–5.0)
Alkaline Phosphatase: 167 U/L — ABNORMAL HIGH
BUN: 13 mg/dL (ref 7–18)
Bilirubin,Total: 0.8 mg/dL (ref 0.2–1.0)
CHLORIDE: 102 mmol/L (ref 98–107)
Calcium, Total: 8.8 mg/dL (ref 8.5–10.1)
Co2: 33 mmol/L — ABNORMAL HIGH (ref 21–32)
Creatinine: 1.06 mg/dL (ref 0.60–1.30)
GFR CALC NON AF AMER: 53 — AB
GLUCOSE: 151 mg/dL — AB (ref 65–99)
OSMOLALITY: 288 (ref 275–301)
Potassium: 3.5 mmol/L (ref 3.5–5.1)
SGPT (ALT): 34 U/L
Sodium: 143 mmol/L (ref 136–145)
TOTAL PROTEIN: 8 g/dL (ref 6.4–8.2)

## 2013-09-07 DIAGNOSIS — E1149 Type 2 diabetes mellitus with other diabetic neurological complication: Secondary | ICD-10-CM | POA: Diagnosis not present

## 2013-09-07 DIAGNOSIS — L84 Corns and callosities: Secondary | ICD-10-CM | POA: Diagnosis not present

## 2013-09-07 DIAGNOSIS — E1142 Type 2 diabetes mellitus with diabetic polyneuropathy: Secondary | ICD-10-CM | POA: Diagnosis not present

## 2013-09-07 DIAGNOSIS — K838 Other specified diseases of biliary tract: Secondary | ICD-10-CM | POA: Diagnosis not present

## 2013-09-07 DIAGNOSIS — R062 Wheezing: Secondary | ICD-10-CM | POA: Diagnosis not present

## 2013-09-07 DIAGNOSIS — E119 Type 2 diabetes mellitus without complications: Secondary | ICD-10-CM | POA: Diagnosis not present

## 2013-09-08 ENCOUNTER — Ambulatory Visit: Payer: Self-pay | Admitting: Family Medicine

## 2013-09-08 DIAGNOSIS — S8990XA Unspecified injury of unspecified lower leg, initial encounter: Secondary | ICD-10-CM | POA: Diagnosis not present

## 2013-09-08 DIAGNOSIS — S99919A Unspecified injury of unspecified ankle, initial encounter: Secondary | ICD-10-CM | POA: Diagnosis not present

## 2013-09-08 DIAGNOSIS — M25569 Pain in unspecified knee: Secondary | ICD-10-CM | POA: Diagnosis not present

## 2013-09-08 DIAGNOSIS — M79609 Pain in unspecified limb: Secondary | ICD-10-CM | POA: Diagnosis not present

## 2013-09-23 DIAGNOSIS — R748 Abnormal levels of other serum enzymes: Secondary | ICD-10-CM | POA: Diagnosis not present

## 2013-10-10 DIAGNOSIS — N390 Urinary tract infection, site not specified: Secondary | ICD-10-CM | POA: Diagnosis not present

## 2013-10-10 DIAGNOSIS — R112 Nausea with vomiting, unspecified: Secondary | ICD-10-CM | POA: Diagnosis not present

## 2013-10-13 DIAGNOSIS — R0781 Pleurodynia: Secondary | ICD-10-CM | POA: Diagnosis not present

## 2013-10-13 DIAGNOSIS — N390 Urinary tract infection, site not specified: Secondary | ICD-10-CM | POA: Diagnosis not present

## 2013-10-13 DIAGNOSIS — J441 Chronic obstructive pulmonary disease with (acute) exacerbation: Secondary | ICD-10-CM | POA: Diagnosis not present

## 2013-10-13 DIAGNOSIS — S300XXA Contusion of lower back and pelvis, initial encounter: Secondary | ICD-10-CM | POA: Diagnosis not present

## 2013-10-13 DIAGNOSIS — S46819A Strain of other muscles, fascia and tendons at shoulder and upper arm level, unspecified arm, initial encounter: Secondary | ICD-10-CM | POA: Diagnosis not present

## 2013-10-13 DIAGNOSIS — M25462 Effusion, left knee: Secondary | ICD-10-CM | POA: Diagnosis not present

## 2013-11-03 DIAGNOSIS — R002 Palpitations: Secondary | ICD-10-CM | POA: Insufficient documentation

## 2013-11-03 DIAGNOSIS — R0789 Other chest pain: Secondary | ICD-10-CM | POA: Diagnosis not present

## 2013-11-03 DIAGNOSIS — G4733 Obstructive sleep apnea (adult) (pediatric): Secondary | ICD-10-CM | POA: Diagnosis not present

## 2013-11-03 DIAGNOSIS — I10 Essential (primary) hypertension: Secondary | ICD-10-CM | POA: Diagnosis not present

## 2013-11-21 DIAGNOSIS — Z23 Encounter for immunization: Secondary | ICD-10-CM | POA: Diagnosis not present

## 2013-11-21 DIAGNOSIS — F331 Major depressive disorder, recurrent, moderate: Secondary | ICD-10-CM | POA: Diagnosis not present

## 2013-11-21 DIAGNOSIS — R2689 Other abnormalities of gait and mobility: Secondary | ICD-10-CM | POA: Diagnosis not present

## 2013-11-21 DIAGNOSIS — E559 Vitamin D deficiency, unspecified: Secondary | ICD-10-CM | POA: Diagnosis not present

## 2013-11-21 DIAGNOSIS — H0520 Unspecified exophthalmos: Secondary | ICD-10-CM | POA: Diagnosis not present

## 2013-11-21 DIAGNOSIS — M4806 Spinal stenosis, lumbar region: Secondary | ICD-10-CM | POA: Diagnosis not present

## 2013-11-21 DIAGNOSIS — L538 Other specified erythematous conditions: Secondary | ICD-10-CM | POA: Diagnosis not present

## 2013-11-25 DIAGNOSIS — E663 Overweight: Secondary | ICD-10-CM | POA: Diagnosis not present

## 2013-11-25 DIAGNOSIS — R0609 Other forms of dyspnea: Secondary | ICD-10-CM | POA: Diagnosis not present

## 2013-11-25 DIAGNOSIS — Z87898 Personal history of other specified conditions: Secondary | ICD-10-CM | POA: Diagnosis not present

## 2013-11-25 DIAGNOSIS — J449 Chronic obstructive pulmonary disease, unspecified: Secondary | ICD-10-CM | POA: Diagnosis not present

## 2013-11-28 ENCOUNTER — Ambulatory Visit: Payer: Self-pay | Admitting: Family Medicine

## 2013-11-28 DIAGNOSIS — M549 Dorsalgia, unspecified: Secondary | ICD-10-CM | POA: Diagnosis not present

## 2013-11-28 DIAGNOSIS — M47814 Spondylosis without myelopathy or radiculopathy, thoracic region: Secondary | ICD-10-CM | POA: Diagnosis not present

## 2013-11-28 DIAGNOSIS — R55 Syncope and collapse: Secondary | ICD-10-CM | POA: Diagnosis not present

## 2013-11-28 DIAGNOSIS — Z9181 History of falling: Secondary | ICD-10-CM | POA: Diagnosis not present

## 2013-11-28 DIAGNOSIS — F339 Major depressive disorder, recurrent, unspecified: Secondary | ICD-10-CM | POA: Diagnosis not present

## 2013-11-28 DIAGNOSIS — M545 Low back pain: Secondary | ICD-10-CM | POA: Diagnosis not present

## 2013-11-28 DIAGNOSIS — M546 Pain in thoracic spine: Secondary | ICD-10-CM | POA: Diagnosis not present

## 2013-11-28 DIAGNOSIS — M79641 Pain in right hand: Secondary | ICD-10-CM | POA: Diagnosis not present

## 2013-11-28 DIAGNOSIS — M47816 Spondylosis without myelopathy or radiculopathy, lumbar region: Secondary | ICD-10-CM | POA: Diagnosis not present

## 2013-12-05 ENCOUNTER — Ambulatory Visit: Payer: Self-pay | Admitting: Family Medicine

## 2013-12-05 DIAGNOSIS — R51 Headache: Secondary | ICD-10-CM | POA: Diagnosis not present

## 2013-12-05 DIAGNOSIS — H0520 Unspecified exophthalmos: Secondary | ICD-10-CM | POA: Diagnosis not present

## 2013-12-05 DIAGNOSIS — R42 Dizziness and giddiness: Secondary | ICD-10-CM | POA: Diagnosis not present

## 2013-12-05 DIAGNOSIS — Z9849 Cataract extraction status, unspecified eye: Secondary | ICD-10-CM | POA: Diagnosis not present

## 2013-12-15 DIAGNOSIS — Z961 Presence of intraocular lens: Secondary | ICD-10-CM | POA: Diagnosis not present

## 2014-02-02 DIAGNOSIS — M545 Low back pain: Secondary | ICD-10-CM | POA: Diagnosis not present

## 2014-02-02 DIAGNOSIS — M4696 Unspecified inflammatory spondylopathy, lumbar region: Secondary | ICD-10-CM | POA: Diagnosis not present

## 2014-02-02 DIAGNOSIS — F321 Major depressive disorder, single episode, moderate: Secondary | ICD-10-CM | POA: Diagnosis not present

## 2014-02-13 DIAGNOSIS — J441 Chronic obstructive pulmonary disease with (acute) exacerbation: Secondary | ICD-10-CM | POA: Diagnosis not present

## 2014-02-17 DIAGNOSIS — M19049 Primary osteoarthritis, unspecified hand: Secondary | ICD-10-CM | POA: Diagnosis not present

## 2014-02-17 DIAGNOSIS — M5410 Radiculopathy, site unspecified: Secondary | ICD-10-CM | POA: Diagnosis not present

## 2014-02-20 DIAGNOSIS — I10 Essential (primary) hypertension: Secondary | ICD-10-CM | POA: Diagnosis not present

## 2014-02-20 DIAGNOSIS — R609 Edema, unspecified: Secondary | ICD-10-CM | POA: Diagnosis not present

## 2014-02-20 DIAGNOSIS — E785 Hyperlipidemia, unspecified: Secondary | ICD-10-CM | POA: Diagnosis not present

## 2014-02-20 DIAGNOSIS — E119 Type 2 diabetes mellitus without complications: Secondary | ICD-10-CM | POA: Diagnosis not present

## 2014-02-20 DIAGNOSIS — I6529 Occlusion and stenosis of unspecified carotid artery: Secondary | ICD-10-CM | POA: Diagnosis not present

## 2014-02-20 DIAGNOSIS — E669 Obesity, unspecified: Secondary | ICD-10-CM | POA: Diagnosis not present

## 2014-02-20 DIAGNOSIS — J45909 Unspecified asthma, uncomplicated: Secondary | ICD-10-CM | POA: Diagnosis not present

## 2014-02-22 ENCOUNTER — Observation Stay (HOSPITAL_COMMUNITY)
Admission: EM | Admit: 2014-02-22 | Discharge: 2014-02-25 | Disposition: A | Discharge status: Home / Self Care | Payer: Medicare Other | Attending: Cardiology | Admitting: Cardiology

## 2014-02-22 ENCOUNTER — Encounter (HOSPITAL_COMMUNITY): Payer: Self-pay | Admitting: Emergency Medicine

## 2014-02-22 ENCOUNTER — Emergency Department (HOSPITAL_COMMUNITY): Payer: Medicare Other

## 2014-02-22 DIAGNOSIS — I350 Nonrheumatic aortic (valve) stenosis: Secondary | ICD-10-CM

## 2014-02-22 DIAGNOSIS — Z23 Encounter for immunization: Secondary | ICD-10-CM | POA: Diagnosis not present

## 2014-02-22 DIAGNOSIS — E785 Hyperlipidemia, unspecified: Secondary | ICD-10-CM | POA: Insufficient documentation

## 2014-02-22 DIAGNOSIS — I10 Essential (primary) hypertension: Secondary | ICD-10-CM | POA: Diagnosis present

## 2014-02-22 DIAGNOSIS — E7800 Pure hypercholesterolemia, unspecified: Secondary | ICD-10-CM | POA: Diagnosis present

## 2014-02-22 DIAGNOSIS — Z955 Presence of coronary angioplasty implant and graft: Secondary | ICD-10-CM | POA: Insufficient documentation

## 2014-02-22 DIAGNOSIS — I2510 Atherosclerotic heart disease of native coronary artery without angina pectoris: Secondary | ICD-10-CM

## 2014-02-22 DIAGNOSIS — Z9861 Coronary angioplasty status: Secondary | ICD-10-CM

## 2014-02-22 DIAGNOSIS — Z794 Long term (current) use of insulin: Secondary | ICD-10-CM | POA: Insufficient documentation

## 2014-02-22 DIAGNOSIS — I739 Peripheral vascular disease, unspecified: Secondary | ICD-10-CM | POA: Diagnosis present

## 2014-02-22 DIAGNOSIS — E1121 Type 2 diabetes mellitus with diabetic nephropathy: Secondary | ICD-10-CM | POA: Diagnosis present

## 2014-02-22 DIAGNOSIS — J45909 Unspecified asthma, uncomplicated: Secondary | ICD-10-CM | POA: Insufficient documentation

## 2014-02-22 DIAGNOSIS — F329 Major depressive disorder, single episode, unspecified: Secondary | ICD-10-CM | POA: Insufficient documentation

## 2014-02-22 DIAGNOSIS — Z7982 Long term (current) use of aspirin: Secondary | ICD-10-CM | POA: Diagnosis not present

## 2014-02-22 DIAGNOSIS — E890 Postprocedural hypothyroidism: Secondary | ICD-10-CM | POA: Insufficient documentation

## 2014-02-22 DIAGNOSIS — I25110 Atherosclerotic heart disease of native coronary artery with unstable angina pectoris: Principal | ICD-10-CM | POA: Insufficient documentation

## 2014-02-22 DIAGNOSIS — J449 Chronic obstructive pulmonary disease, unspecified: Secondary | ICD-10-CM | POA: Diagnosis present

## 2014-02-22 DIAGNOSIS — I517 Cardiomegaly: Secondary | ICD-10-CM | POA: Diagnosis not present

## 2014-02-22 DIAGNOSIS — I252 Old myocardial infarction: Secondary | ICD-10-CM | POA: Diagnosis not present

## 2014-02-22 DIAGNOSIS — R079 Chest pain, unspecified: Secondary | ICD-10-CM

## 2014-02-22 DIAGNOSIS — Z87891 Personal history of nicotine dependence: Secondary | ICD-10-CM | POA: Diagnosis not present

## 2014-02-22 DIAGNOSIS — I4719 Other supraventricular tachycardia: Secondary | ICD-10-CM | POA: Diagnosis not present

## 2014-02-22 DIAGNOSIS — Z66 Do not resuscitate: Secondary | ICD-10-CM | POA: Insufficient documentation

## 2014-02-22 DIAGNOSIS — R072 Precordial pain: Secondary | ICD-10-CM | POA: Diagnosis not present

## 2014-02-22 DIAGNOSIS — Z853 Personal history of malignant neoplasm of breast: Secondary | ICD-10-CM | POA: Insufficient documentation

## 2014-02-22 DIAGNOSIS — K219 Gastro-esophageal reflux disease without esophagitis: Secondary | ICD-10-CM | POA: Diagnosis not present

## 2014-02-22 DIAGNOSIS — R0789 Other chest pain: Secondary | ICD-10-CM | POA: Diagnosis not present

## 2014-02-22 DIAGNOSIS — Z79899 Other long term (current) drug therapy: Secondary | ICD-10-CM | POA: Insufficient documentation

## 2014-02-22 DIAGNOSIS — I200 Unstable angina: Secondary | ICD-10-CM | POA: Diagnosis present

## 2014-02-22 DIAGNOSIS — I471 Supraventricular tachycardia: Secondary | ICD-10-CM | POA: Diagnosis not present

## 2014-02-22 HISTORY — DX: Unspecified asthma, uncomplicated: J45.909

## 2014-02-22 HISTORY — DX: Malignant neoplasm of unspecified site of left female breast: C50.912

## 2014-02-22 HISTORY — DX: Unspecified osteoarthritis, unspecified site: M19.90

## 2014-02-22 HISTORY — DX: Personal history of other medical treatment: Z92.89

## 2014-02-22 HISTORY — DX: Other chronic pain: G89.29

## 2014-02-22 HISTORY — DX: Dorsalgia, unspecified: M54.9

## 2014-02-22 HISTORY — DX: Polyneuropathy, unspecified: G62.9

## 2014-02-22 HISTORY — DX: Anemia, unspecified: D64.9

## 2014-02-22 HISTORY — DX: Type 2 diabetes mellitus without complications: E11.9

## 2014-02-22 HISTORY — DX: Acute myocardial infarction, unspecified: I21.9

## 2014-02-22 HISTORY — DX: Anxiety disorder, unspecified: F41.9

## 2014-02-22 LAB — I-STAT TROPONIN, ED: TROPONIN I, POC: 0.01 ng/mL (ref 0.00–0.08)

## 2014-02-22 MED ORDER — ASPIRIN 81 MG PO CHEW
324.0000 mg | CHEWABLE_TABLET | Freq: Once | ORAL | Status: DC
  Filled 2014-02-22: qty 4

## 2014-02-22 MED ORDER — NITROGLYCERIN 2 % TD OINT
0.5000 [in_us] | TOPICAL_OINTMENT | Freq: Once | TRANSDERMAL | Status: AC
  Administered 2014-02-22: 0.5 [in_us] via TOPICAL
  Filled 2014-02-22: qty 1

## 2014-02-22 NOTE — ED Provider Notes (Signed)
CSN: [id number]     Arrival date & time 02/22/14  2232 History  This chart was scribed for [name], MD by Delphia Grates, ED Scribe. This patient was seen in room D31C/D31C and the patient's care was started at 11:18 PM.   Chief Complaint  Patient presents with  . Chest Pain    The patient was at the laundry mat with her daughter and she had sudden onset of chest pain.  She returned home and her chest pain was still there so she took 324mg  of Aspirin and 2 ntiros.     The history is provided by the patient. No language interpreter was used.     HPI Comments: Bonnie Henry is a 74 y.o. female, with history of COPD, GERD, DM, hypercholesteremia, HTN, CAD, brought in by ambulance, who presents to the Emergency Department complaining of sudden onset, intermittent chest pain that began approximately 6 hours ago at 1730. Patient states she was leaving the laundromat when the pain started and states it became progressively worse after returning home. Patient states the pain "hurt like hell". She denies any chest pain currently, but states she can still feel mild discomfort at this time. There is associated cough, in addition to numbness to the bilateral arms, however, she states this has resolved. Patient also notes intermittent leg swelling, however, this is baseline and worse when ambulating. She reports history of coronary disease with stenting. She states her last stress test and heart cath was 4 years ago and was informed she had "damage" to her heart, but nothing new. Patient was taken off Plavix, and currently takes a daily baby aspirin. She denies SOB, diaphoresis, fever, or chills. Patient is a former smoker.  PCP: [facility] in [facility] in [place]   Past Medical History  Diagnosis Date  . Diabetes   . Hypertension   . GERD (gastroesophageal reflux disease)   . CAD (coronary artery disease)   . History of heart attack   .  Hypercholesteremia   . COPD (chronic obstructive pulmonary disease)   . Morbid obesity   . Sleep apnea   . Anxiety disorder   . Post-surgical hypothyroidism   . Depression, major   . Neuropathy    Past Surgical History  Procedure Laterality Date  . Bartholin cyst marsupialization    . Trigger finger repair    . Cataract extraction with iol    . Left knee replacement    . Total hip arthroplasty Bilateral   . Thyroidectomy, partial    . Right shoulder repaired    . Toe and foot repair Right   . Tonsillectomy    . Carotid endarterectomy  2009   Family History  Problem Relation Age of Onset  . Arthritis Mother   . Cancer Mother   . Diabetes Mother   . Heart disease Mother   . Hyperlipidemia Mother   . Hypertension Mother   . Alcohol abuse Father   . Alcohol abuse Brother   . Arthritis Brother   . Asthma Brother   . HIV Brother   . Cancer Brother   . Diabetes Brother   . Hyperlipidemia Brother   . Hypertension Brother   . Lung disease Brother   . Arthritis Maternal Grandmother    History  Substance Use Topics  . Smoking status: Never Smoker   . Smokeless tobacco: Not on file  . Alcohol Use: No   OB History    No data available  Review of Systems  Cardiovascular: Positive for chest pain and leg swelling (baseline).  Neurological: Positive for numbness (resolved).  All other systems reviewed and are negative.     Allergies  Codeine; Enalapril; and Morphine and related  Home Medications   Prior to Admission medications   Medication Sig Start Date End Date Taking? Authorizing Provider  albuterol (PROAIR HFA) 108 (90 BASE) MCG/ACT inhaler Inhale 2 puffs into the lungs every 4 (four) hours as needed for wheezing or shortness of breath.    Yes Historical Provider, MD  amLODipine (NORVASC) 10 MG tablet Take 10 mg by mouth daily.   Yes Historical Provider, MD  aspirin EC 81 MG tablet Take 81 mg by mouth daily.   Yes Historical Provider, MD  atorvastatin  (LIPITOR) 20 MG tablet Take 20 mg by mouth daily.   Yes Historical Provider, MD  budesonide-formoterol (SYMBICORT) 160-4.5 MCG/ACT inhaler Inhale 2 puffs into the lungs 2 (two) times daily. RINSE MOUTH AFTER EACH USE   Yes Historical Provider, MD  bumetanide (BUMEX) 1 MG tablet Take 1 mg by mouth daily.   Yes Historical Provider, MD  busPIRone (BUSPAR) 15 MG tablet Take 15 mg by mouth 2 (two) times daily.   Yes Historical Provider, MD  carvedilol (COREG) 12.5 MG tablet Take 12.5 mg by mouth 2 (two) times daily with a meal.   Yes Historical Provider, MD  escitalopram (LEXAPRO) 10 MG tablet Take 10 mg by mouth daily.   Yes Historical Provider, MD  insulin aspart (NOVOLOG) 100 UNIT/ML injection Inject 5 Units into the skin daily as needed (if sugar is up take 5 units).    Yes Historical Provider, MD  insulin glargine (LANTUS) 100 UNIT/ML injection Inject 40 Units into the skin daily.   Yes Historical Provider, MD  levothyroxine (SYNTHROID, LEVOTHROID) 125 MCG tablet Take 125 mcg by mouth daily before breakfast.   Yes Historical Provider, MD  montelukast (SINGULAIR) 10 MG tablet Take 10 mg by mouth at bedtime.   Yes Historical Provider, MD  Omega-3 Fatty Acids (FISH OIL) 1000 MG CAPS Take 1 capsule by mouth daily.    Yes Historical Provider, MD  omeprazole (PRILOSEC) 20 MG capsule Take 20 mg by mouth daily.   Yes Historical Provider, MD  oxyCODONE-acetaminophen (PERCOCET/ROXICET) 5-325 MG per tablet Take 1 tablet by mouth at bedtime as needed for severe pain.   Yes Historical Provider, MD  PRESCRIPTION MEDICATION Take 1 tablet by mouth daily. iron   Yes Historical Provider, MD  rOPINIRole (REQUIP) 1 MG tablet Take 1 mg by mouth 3 (three) times daily.   Yes Historical Provider, MD  sertraline (ZOLOFT) 100 MG tablet Take 100 mg by mouth daily.   Yes Historical Provider, MD  spironolactone (ALDACTONE) 25 MG tablet Take 25 mg by mouth daily.   Yes Historical Provider, MD  tiotropium (SPIRIVA HANDIHALER) 18  MCG inhalation capsule Place 18 mcg into inhaler and inhale daily.   Yes Historical Provider, MD   Triage Vitals: BP 128/45 mmHg  Pulse 61  Temp(Src) 98 F (36.7 C) (Oral)  Resp 12  SpO2 97%  Physical Exam  Constitutional: She appears well-developed and well-nourished. No distress.  HENT:  Head: Normocephalic and atraumatic.  Mouth/Throat: Oropharynx is clear and moist. No oropharyngeal exudate.  Eyes: Conjunctivae and EOM are normal. Pupils are equal, round, and reactive to light. Right eye exhibits no discharge. Left eye exhibits no discharge. No scleral icterus.  Neck: Normal range of motion. Neck supple. No JVD present. No thyromegaly  present.  Cardiovascular: Normal rate, regular rhythm and intact distal pulses.  Exam reveals no gallop and no friction rub.   Murmur heard. Mid systolic murmur.  Pulmonary/Chest: Effort normal and breath sounds normal. No respiratory distress. She has no wheezes. She has no rales.  Abdominal: Soft. Bowel sounds are normal. She exhibits no distension and no mass. There is no tenderness.  Musculoskeletal: Normal range of motion. She exhibits no edema or tenderness.  Lymphadenopathy:    She has no cervical adenopathy.  Neurological: She is alert. Coordination normal.  Skin: Skin is warm and dry. No rash noted. No erythema.  Psychiatric: She has a normal mood and affect. Her behavior is normal.  Nursing note and vitals reviewed.   ED Course  Procedures (including critical care time)  DIAGNOSTIC STUDIES: Oxygen Saturation is 97% on room air, normal by my interpretation.    COORDINATION OF CARE: At 2328 Discussed treatment plan with patient which includes labs, aspirin, and nitroglycerin. Patient agrees.   Labs Review Labs Reviewed  BASIC METABOLIC PANEL - Abnormal; Notable for the following:    Glucose, Bld 143 (*)    GFR calc non Af Amer 55 (*)    GFR calc Af Amer 64 (*)    All other components within normal limits  CBC - Abnormal; Notable  for the following:    Hemoglobin 10.1 (*)    HCT 32.2 (*)    RDW 15.9 (*)    All other components within normal limits  CBG MONITORING, ED - Abnormal; Notable for the following:    Glucose-Capillary 141 (*)    All other components within normal limits  TROPONIN I  TROPONIN I  TROPONIN I  I-STAT TROPOININ, ED    Imaging Review Dg Chest Port 1 View  02/22/2014   CLINICAL DATA:  Midchest pain  EXAM: PORTABLE CHEST - 1 VIEW  COMPARISON:  11/28/2013  FINDINGS: A single AP portable view of the chest demonstrates no focal airspace consolidation or alveolar edema. The lungs are grossly clear. There is no large effusion or pneumothorax. There is unchanged cardiomegaly. Cardiac and mediastinal contours are otherwise unremarkable.  IMPRESSION: No active disease.   Electronically Signed   By: Andreas Newport M.D.   On: 02/22/2014 23:44     EKG Interpretation   Date/Time:  Wednesday February 22 2014 22:36:52 EST Ventricular Rate:  66 PR Interval:  182 QRS Duration: 86 QT Interval:  436 QTC Calculation: 457 R Axis:   -9 Text Interpretation:  Sinus rhythm normal conduction Normal axis Confirmed  by Kathrynn Humble, MD, Thelma Comp [phone]) on 02/22/2014 10:51:37 PM      MDM   Final diagnoses:  Chest pain, unspecified chest pain type    The patient has had ongoing gradually improving chest pain which has almost completely resolved, there is concern that with her prior history she would need to have further evaluation with a potential stress test or possibly heart catheterization. Her initial troponin is normal however she will need multiple troponins, admission to the hospitalI discussed the patient's care with Dr. Alcario Drought of the hospitalist service who will admit the patient.  I personally performed the services described in this documentation, which was scribed in my presence. The recorded information has been reviewed and is accurate.     [name], MD 02/23/14 [phone]

## 2014-02-22 NOTE — ED Notes (Signed)
The patient was at the laundry mat with her daughter and she had sudden onset of chest pain.  She returned home and her chest pain was still there so she took 324mg  of Aspirin and 2 ntiros.  She called EMS.  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  She is chest pain free now.  EMS placed a 20 gauge and transported her here.

## 2014-02-22 NOTE — ED Notes (Signed)
Family at bedside. 

## 2014-02-23 ENCOUNTER — Encounter (HOSPITAL_COMMUNITY): Payer: Self-pay | Admitting: General Practice

## 2014-02-23 DIAGNOSIS — Z9861 Coronary angioplasty status: Secondary | ICD-10-CM

## 2014-02-23 DIAGNOSIS — I350 Nonrheumatic aortic (valve) stenosis: Secondary | ICD-10-CM | POA: Diagnosis not present

## 2014-02-23 DIAGNOSIS — I2510 Atherosclerotic heart disease of native coronary artery without angina pectoris: Secondary | ICD-10-CM

## 2014-02-23 DIAGNOSIS — I200 Unstable angina: Secondary | ICD-10-CM | POA: Diagnosis not present

## 2014-02-23 DIAGNOSIS — I25110 Atherosclerotic heart disease of native coronary artery with unstable angina pectoris: Secondary | ICD-10-CM | POA: Diagnosis not present

## 2014-02-23 DIAGNOSIS — E119 Type 2 diabetes mellitus without complications: Secondary | ICD-10-CM

## 2014-02-23 DIAGNOSIS — J449 Chronic obstructive pulmonary disease, unspecified: Secondary | ICD-10-CM | POA: Diagnosis present

## 2014-02-23 DIAGNOSIS — I209 Angina pectoris, unspecified: Secondary | ICD-10-CM

## 2014-02-23 DIAGNOSIS — E669 Obesity, unspecified: Secondary | ICD-10-CM | POA: Diagnosis not present

## 2014-02-23 DIAGNOSIS — E1121 Type 2 diabetes mellitus with diabetic nephropathy: Secondary | ICD-10-CM | POA: Diagnosis present

## 2014-02-23 DIAGNOSIS — E7800 Pure hypercholesterolemia, unspecified: Secondary | ICD-10-CM | POA: Diagnosis present

## 2014-02-23 DIAGNOSIS — I739 Peripheral vascular disease, unspecified: Secondary | ICD-10-CM | POA: Diagnosis present

## 2014-02-23 DIAGNOSIS — J411 Mucopurulent chronic bronchitis: Secondary | ICD-10-CM | POA: Diagnosis not present

## 2014-02-23 DIAGNOSIS — I10 Essential (primary) hypertension: Secondary | ICD-10-CM | POA: Diagnosis not present

## 2014-02-23 LAB — BASIC METABOLIC PANEL
ANION GAP: 6 (ref 5–15)
BUN: 17 mg/dL (ref 6–23)
CALCIUM: 8.7 mg/dL (ref 8.4–10.5)
CHLORIDE: 103 mmol/L (ref 96–112)
CO2: 30 mmol/L (ref 19–32)
Creatinine, Ser: 1 mg/dL (ref 0.50–1.10)
GFR calc Af Amer: 64 mL/min — ABNORMAL LOW (ref >90)
GFR calc non Af Amer: 55 mL/min — ABNORMAL LOW (ref >90)
GLUCOSE: 143 mg/dL — AB (ref 70–99)
Potassium: 3.5 mmol/L (ref 3.5–5.1)
Sodium: 139 mmol/L (ref 135–145)

## 2014-02-23 LAB — TROPONIN I
Troponin I: <0.03 ng/mL (ref <0.031)
Troponin I: <0.03 ng/mL (ref <0.031)
Troponin I: <0.03 ng/mL (ref <0.031)

## 2014-02-23 LAB — CBC
HEMATOCRIT: 32.2 % — AB (ref 36.0–46.0)
HEMOGLOBIN: 10.1 g/dL — AB (ref 12.0–15.0)
MCH: 26 pg (ref 26.0–34.0)
MCHC: 31.4 g/dL (ref 30.0–36.0)
MCV: 82.8 fL (ref 78.0–100.0)
Platelets: 204 10*3/uL (ref 150–400)
RBC: 3.89 MIL/uL (ref 3.87–5.11)
RDW: 15.9 % — ABNORMAL HIGH (ref 11.5–15.5)
WBC: 8.2 10*3/uL (ref 4.0–10.5)

## 2014-02-23 LAB — CBG MONITORING, ED
Glucose-Capillary: 141 mg/dL — ABNORMAL HIGH (ref 70–99)
Glucose-Capillary: 94 mg/dL (ref 70–99)
Glucose-Capillary: 83 mg/dL (ref 70–99)
Glucose-Capillary: 84 mg/dL (ref 70–99)

## 2014-02-23 MED ORDER — ESCITALOPRAM OXALATE 10 MG PO TABS
10.0000 mg | ORAL_TABLET | Freq: Every day | ORAL | Status: DC
  Administered 2014-02-23 – 2014-02-25 (×3): 10 mg via ORAL
  Filled 2014-02-23 (×3): qty 1

## 2014-02-23 MED ORDER — ONDANSETRON HCL 4 MG/2ML IJ SOLN: 4.0000 mg | Freq: Three times a day (TID) | INTRAMUSCULAR | Status: DC | PRN

## 2014-02-23 MED ORDER — AMLODIPINE BESYLATE 10 MG PO TABS
10.0000 mg | ORAL_TABLET | Freq: Every day | ORAL | Status: DC
  Administered 2014-02-24 – 2014-02-25 (×2): 10 mg via ORAL
  Filled 2014-02-23 (×2): qty 1
  Filled 2014-02-23: qty 2

## 2014-02-23 MED ORDER — BUSPIRONE HCL 15 MG PO TABS
15.0000 mg | ORAL_TABLET | Freq: Two times a day (BID) | ORAL | Status: DC
  Administered 2014-02-23 – 2014-02-25 (×5): 15 mg via ORAL
  Filled 2014-02-23 (×5): qty 1
  Filled 2014-02-23: qty 2

## 2014-02-23 MED ORDER — INSULIN ASPART 100 UNIT/ML ~~LOC~~ SOLN
0.0000 [IU] | SUBCUTANEOUS | Status: DC
  Administered 2014-02-24 – 2014-02-25 (×4): 1 [IU] via SUBCUTANEOUS

## 2014-02-23 MED ORDER — PNEUMOCOCCAL VAC POLYVALENT 25 MCG/0.5ML IJ INJ
0.5000 mL | INJECTION | INTRAMUSCULAR | Status: DC
  Filled 2014-02-23: qty 0.5

## 2014-02-23 MED ORDER — CARVEDILOL 12.5 MG PO TABS
12.5000 mg | ORAL_TABLET | Freq: Two times a day (BID) | ORAL | Status: DC
  Filled 2014-02-23 (×3): qty 1

## 2014-02-23 MED ORDER — INSULIN GLARGINE 100 UNIT/ML ~~LOC~~ SOLN
20.0000 [IU] | Freq: Every day | SUBCUTANEOUS | Status: DC
  Administered 2014-02-25: 20 [IU] via SUBCUTANEOUS
  Filled 2014-02-23 (×3): qty 0.2

## 2014-02-23 MED ORDER — MONTELUKAST SODIUM 10 MG PO TABS
10.0000 mg | ORAL_TABLET | Freq: Every day | ORAL | Status: DC
  Administered 2014-02-23 – 2014-02-24 (×3): 10 mg via ORAL
  Filled 2014-02-23 (×4): qty 1

## 2014-02-23 MED ORDER — ALBUTEROL SULFATE (2.5 MG/3ML) 0.083% IN NEBU: 2.5000 mg | INHALATION_SOLUTION | RESPIRATORY_TRACT | Status: DC | PRN

## 2014-02-23 MED ORDER — TIOTROPIUM BROMIDE MONOHYDRATE 18 MCG IN CAPS
18.0000 ug | ORAL_CAPSULE | Freq: Every day | RESPIRATORY_TRACT | Status: DC
  Administered 2014-02-23 – 2014-02-24 (×2): 18 ug via RESPIRATORY_TRACT
  Filled 2014-02-23: qty 5

## 2014-02-23 MED ORDER — ACETAMINOPHEN 325 MG PO TABS: 650.0000 mg | ORAL_TABLET | ORAL | Status: DC | PRN

## 2014-02-23 MED ORDER — SODIUM CHLORIDE 0.9 % IV SOLN
INTRAVENOUS | Status: DC
  Administered 2014-02-23: 20:00:00 via INTRAVENOUS

## 2014-02-23 MED ORDER — LEVOTHYROXINE SODIUM 125 MCG PO TABS
125.0000 ug | ORAL_TABLET | Freq: Every day | ORAL | Status: DC
  Administered 2014-02-23 – 2014-02-25 (×3): 125 ug via ORAL
  Filled 2014-02-23 (×5): qty 1

## 2014-02-23 MED ORDER — PANTOPRAZOLE SODIUM 40 MG PO TBEC
40.0000 mg | DELAYED_RELEASE_TABLET | Freq: Every day | ORAL | Status: DC
  Administered 2014-02-23 – 2014-02-25 (×3): 40 mg via ORAL
  Filled 2014-02-23 (×3): qty 1

## 2014-02-23 MED ORDER — BUDESONIDE-FORMOTEROL FUMARATE 160-4.5 MCG/ACT IN AERO
2.0000 | INHALATION_SPRAY | Freq: Two times a day (BID) | RESPIRATORY_TRACT | Status: DC
  Administered 2014-02-24 – 2014-02-25 (×3): 2 via RESPIRATORY_TRACT
  Filled 2014-02-23 (×2): qty 6

## 2014-02-23 MED ORDER — ONDANSETRON HCL 4 MG/2ML IJ SOLN: 4.0000 mg | Freq: Four times a day (QID) | INTRAMUSCULAR | Status: DC | PRN

## 2014-02-23 MED ORDER — ASPIRIN EC 81 MG PO TBEC
81.0000 mg | DELAYED_RELEASE_TABLET | Freq: Every day | ORAL | Status: DC
  Administered 2014-02-23 – 2014-02-25 (×3): 81 mg via ORAL
  Filled 2014-02-23 (×3): qty 1

## 2014-02-23 MED ORDER — ALBUTEROL SULFATE HFA 108 (90 BASE) MCG/ACT IN AERS: 2.0000 | INHALATION_SPRAY | RESPIRATORY_TRACT | Status: DC | PRN

## 2014-02-23 MED ORDER — OXYCODONE-ACETAMINOPHEN 5-325 MG PO TABS
1.0000 | ORAL_TABLET | Freq: Every evening | ORAL | Status: DC | PRN
  Administered 2014-02-23 – 2014-02-24 (×2): 1 via ORAL
  Filled 2014-02-23 (×2): qty 1

## 2014-02-23 MED ORDER — NEBIVOLOL HCL 5 MG PO TABS
5.0000 mg | ORAL_TABLET | Freq: Every day | ORAL | Status: DC
  Administered 2014-02-23 – 2014-02-25 (×3): 5 mg via ORAL
  Filled 2014-02-23 (×3): qty 1

## 2014-02-23 MED ORDER — HEPARIN SODIUM (PORCINE) 5000 UNIT/ML IJ SOLN
5000.0000 [IU] | Freq: Three times a day (TID) | INTRAMUSCULAR | Status: DC
  Administered 2014-02-23 (×2): 5000 [IU] via SUBCUTANEOUS
  Filled 2014-02-23 (×5): qty 1

## 2014-02-23 MED ORDER — ROPINIROLE HCL 1 MG PO TABS
1.0000 mg | ORAL_TABLET | Freq: Three times a day (TID) | ORAL | Status: DC
Start: 2014-02-23 — End: 2014-02-25
  Administered 2014-02-23 – 2014-02-25 (×5): 1 mg via ORAL
  Filled 2014-02-23 (×9): qty 1

## 2014-02-23 MED ORDER — SERTRALINE HCL 100 MG PO TABS
100.0000 mg | ORAL_TABLET | Freq: Every day | ORAL | Status: DC
Start: 2014-02-23 — End: 2014-02-25
  Administered 2014-02-23 – 2014-02-25 (×3): 100 mg via ORAL
  Filled 2014-02-23 (×2): qty 1
  Filled 2014-02-23: qty 2

## 2014-02-23 MED ORDER — ATORVASTATIN CALCIUM 20 MG PO TABS
20.0000 mg | ORAL_TABLET | Freq: Every day | ORAL | Status: DC
  Administered 2014-02-23 – 2014-02-25 (×3): 20 mg via ORAL
  Filled 2014-02-23 (×3): qty 1

## 2014-02-23 NOTE — ED Notes (Addendum)
Placed pt on nasal cannula 2 liters, per nurses orders. Due to her stats dropping down from 92% to 87%. Pt O2 is now up to 100%

## 2014-02-23 NOTE — ED Notes (Signed)
Pt CBG, 141. Nurse was notified.

## 2014-02-23 NOTE — ED Notes (Signed)
Patient is resting comfortably. 

## 2014-02-23 NOTE — ED Notes (Signed)
Pt CBG, 94. Nurse was notified.

## 2014-02-23 NOTE — ED Notes (Signed)
Family at bedside. 

## 2014-02-23 NOTE — H&P (Signed)
[facility] History and Physical  Bonnie Henry YWV:[id number] DOB: October 31, 1940 DOA: 02/22/2014  Referring physician: EDP PCP: LADA, Satira Anis, MD   Chief Complaint: Chest pain   HPI: Bonnie Henry is a 74 y.o. female h/o CAD s/p stenting x3 done 4 years ago in [place], last stress test also at that time.  Patient presents to ED with c/o sudden onset, intermittent chest pain that onset 6 hours ago at 1730.  Got progressively worse until she took NTG.  Associated numbness to bilateral arms.  Feels exactly like MI pain 4 years ago that resulted in PCI with stenting x3.  Patient on daily baby ASA no longer on plavix.  Review of records indicates patient had been having some chest discomfort for at least months.  Last cardiology visit was to [facility] in October 2015.  At that time cardiology said that they would consider functional testing if chest pain worsened at all.  Review of Systems: Systems reviewed.  As above, otherwise negative  Past Medical History  Diagnosis Date  . Diabetes   . Hypertension   . GERD (gastroesophageal reflux disease)   . CAD (coronary artery disease)   . History of heart attack   . Hypercholesteremia   . COPD (chronic obstructive pulmonary disease)   . Morbid obesity   . Sleep apnea   . Anxiety disorder   . Post-surgical hypothyroidism   . Depression, major   . Neuropathy    Past Surgical History  Procedure Laterality Date  . Bartholin cyst marsupialization    . Trigger finger repair    . Cataract extraction with iol    . Left knee replacement    . Total hip arthroplasty Bilateral   . Thyroidectomy, partial    . Right shoulder repaired    . Toe and foot repair Right   . Tonsillectomy    . Carotid endarterectomy  2009   Social History:  reports that she has never smoked. She does not have any smokeless tobacco history on file. She reports that she does not drink alcohol or use illicit drugs.  Allergies  Allergen  Reactions  . Codeine Nausea And Vomiting  . Enalapril Other (See Comments)    Angioedema  . Morphine And Related Nausea And Vomiting    Family History  Problem Relation Age of Onset  . Arthritis Mother   . Cancer Mother   . Diabetes Mother   . Heart disease Mother   . Hyperlipidemia Mother   . Hypertension Mother   . Alcohol abuse Father   . Alcohol abuse Brother   . Arthritis Brother   . Asthma Brother   . HIV Brother   . Cancer Brother   . Diabetes Brother   . Hyperlipidemia Brother   . Hypertension Brother   . Lung disease Brother   . Arthritis Maternal Grandmother      Prior to Admission medications   Medication Sig Start Date End Date Taking? Authorizing Provider  albuterol (PROAIR HFA) 108 (90 BASE) MCG/ACT inhaler Inhale 2 puffs into the lungs every 4 (four) hours as needed for wheezing or shortness of breath.    Yes Historical Provider, MD  amLODipine (NORVASC) 10 MG tablet Take 10 mg by mouth daily.   Yes Historical Provider, MD  aspirin EC 81 MG tablet Take 81 mg by mouth daily.   Yes Historical Provider, MD  atorvastatin (LIPITOR) 20 MG tablet Take 20 mg by mouth daily.   Yes Historical Provider, MD  budesonide-formoterol (  SYMBICORT) 160-4.5 MCG/ACT inhaler Inhale 2 puffs into the lungs 2 (two) times daily. RINSE MOUTH AFTER EACH USE   Yes Historical Provider, MD  bumetanide (BUMEX) 1 MG tablet Take 1 mg by mouth daily.   Yes Historical Provider, MD  busPIRone (BUSPAR) 15 MG tablet Take 15 mg by mouth 2 (two) times daily.   Yes Historical Provider, MD  carvedilol (COREG) 12.5 MG tablet Take 12.5 mg by mouth 2 (two) times daily with a meal.   Yes Historical Provider, MD  escitalopram (LEXAPRO) 10 MG tablet Take 10 mg by mouth daily.   Yes Historical Provider, MD  insulin aspart (NOVOLOG) 100 UNIT/ML injection Inject 5 Units into the skin daily as needed (if sugar is up take 5 units).    Yes Historical Provider, MD  insulin glargine (LANTUS) 100 UNIT/ML injection  Inject 40 Units into the skin daily.   Yes Historical Provider, MD  levothyroxine (SYNTHROID, LEVOTHROID) 125 MCG tablet Take 125 mcg by mouth daily before breakfast.   Yes Historical Provider, MD  montelukast (SINGULAIR) 10 MG tablet Take 10 mg by mouth at bedtime.   Yes Historical Provider, MD  Omega-3 Fatty Acids (FISH OIL) 1000 MG CAPS Take 1 capsule by mouth daily.    Yes Historical Provider, MD  omeprazole (PRILOSEC) 20 MG capsule Take 20 mg by mouth daily.   Yes Historical Provider, MD  oxyCODONE-acetaminophen (PERCOCET/ROXICET) 5-325 MG per tablet Take 1 tablet by mouth at bedtime as needed for severe pain.   Yes Historical Provider, MD  PRESCRIPTION MEDICATION Take 1 tablet by mouth daily. iron   Yes Historical Provider, MD  rOPINIRole (REQUIP) 1 MG tablet Take 1 mg by mouth 3 (three) times daily.   Yes Historical Provider, MD  sertraline (ZOLOFT) 100 MG tablet Take 100 mg by mouth daily.   Yes Historical Provider, MD  spironolactone (ALDACTONE) 25 MG tablet Take 25 mg by mouth daily.   Yes Historical Provider, MD  tiotropium (SPIRIVA HANDIHALER) 18 MCG inhalation capsule Place 18 mcg into inhaler and inhale daily.   Yes Historical Provider, MD   Physical Exam: Filed Vitals:   02/23/14 0030  BP: 139/50  Pulse: 60  Temp:   Resp: 12    BP 139/50 mmHg  Pulse 60  Temp(Src) 98 F (36.7 C) (Oral)  Resp 12  SpO2 96%  General Appearance:    Alert, oriented, no distress, appears stated age  Head:    Normocephalic, atraumatic  Eyes:    PERRL, EOMI, sclera non-icteric        Nose:   Nares without drainage or epistaxis. Mucosa, turbinates normal  Throat:   Moist mucous membranes. Oropharynx without erythema or exudate.  Neck:   Supple. No carotid bruits.  No thyromegaly.  No lymphadenopathy.   Back:     No CVA tenderness, no spinal tenderness  Lungs:     Clear to auscultation bilaterally, without wheezes, rhonchi or rales  Chest wall:    No tenderness to palpitation  Heart:     Regular rate and rhythm without murmurs, gallops, rubs  Abdomen:     Soft, non-tender, nondistended, normal bowel sounds, no organomegaly  Genitalia:    deferred  Rectal:    deferred  Extremities:   No clubbing, cyanosis or edema.  Pulses:   2+ and symmetric all extremities  Skin:   Skin color, texture, turgor normal, no rashes or lesions  Lymph nodes:   Cervical, supraclavicular, and axillary nodes normal  Neurologic:   CNII-XII intact.  Normal strength, sensation and reflexes      throughout    Labs on Admission:  Basic Metabolic Panel:  Recent Labs Lab 02/22/14 2325  NA 139  K 3.5  CL 103  CO2 30  GLUCOSE 143*  BUN 17  CREATININE 1.00  CALCIUM 8.7   Liver Function Tests: No results for input(s): AST, ALT, ALKPHOS, BILITOT, PROT, ALBUMIN in the last 168 hours. No results for input(s): LIPASE, AMYLASE in the last 168 hours. No results for input(s): AMMONIA in the last 168 hours. CBC:  Recent Labs Lab 02/22/14 2325  WBC 8.2  HGB 10.1*  HCT 32.2*  MCV 82.8  PLT 204   Cardiac Enzymes: No results for input(s): CKTOTAL, CKMB, CKMBINDEX, TROPONINI in the last 168 hours.  BNP (last 3 results) No results for input(s): PROBNP in the last 8760 hours. CBG: No results for input(s): GLUCAP in the last 168 hours.  Radiological Exams on Admission: Dg Chest Port 1 View  02/22/2014   CLINICAL DATA:  Midchest pain  EXAM: PORTABLE CHEST - 1 VIEW  COMPARISON:  11/28/2013  FINDINGS: A single AP portable view of the chest demonstrates no focal airspace consolidation or alveolar edema. The lungs are grossly clear. There is no large effusion or pneumothorax. There is unchanged cardiomegaly. Cardiac and mediastinal contours are otherwise unremarkable.  IMPRESSION: No active disease.   Electronically Signed   By: Andreas Newport M.D.   On: 02/22/2014 23:44    EKG: Independently reviewed.  Assessment/Plan Principal Problem:   Chest pain Active Problems:   DM2 (diabetes mellitus,  type 2)   HTN (hypertension)   CAD S/P percutaneous coronary angioplasty   High cholesterol   1. Chest pain - HEART score = 6: 1. Serial troponins 2. NPO after midnight 3. Likely needs stress test in AM, see also her cardiologists office note in care everywhere dated 11/03/13 4. Hold diuretics 5. Tele monitor 2. HTN - continue home meds 3. DM2 - due to NPO: reduce lantus to 20 units daily (half her home 40 units daily), adding low dose SSI q4h. 4. HLD - continue statin    Code Status: Full Code  Family Communication: Daughter at bedside Disposition Plan: Admit to obs  Time spent: 70 min  GARDNER, JARED M. [facility] Pager [phone]  If 7AM-7PM, please contact the day team taking care of the patient [email or web address] Password [email or web address] 02/23/2014, 1:02 AM

## 2014-02-23 NOTE — Progress Notes (Signed)
   02/23/14 1800  Clinical Encounter Type  Visited With Patient and family together;Health care provider  Visit Type Initial   Chaplain was paged at 5:46PM to patient's room. Chaplain was notified that the patient was interested in getting more information regarding an advanced directive. Chaplain visited with patient and went over the contents of an advanced directive. Patient's daughter and granddaughter later arrived and patient wished for me to go over this information with her daughter. Chaplain reviewed the contents of the advanced directive with the patient's daughter. Chaplain left the patient with a blank copy of the advanced directive forms. Chaplain let family know that a chaplain could help facilitate the completion of the document tomorrow morning when a notary is available.  Please contact spiritual care department tomorrow during the day if the patient wishes to have her advanced directive completed in the hospital. Gar Ponto, Chaplain  6:26 PM

## 2014-02-23 NOTE — Progress Notes (Signed)
[facility] PROGRESS NOTE   Bonnie Henry HAL:[id number] DOB: May 02, 1940 DOA: 02/22/2014 PCP: LADA, MELINDA P, MD  HPI/Subjective: Feels much better, denies any chest pain. Still has nitroglycerin paste on her chest.  Assessment/Plan: Principal Problem:   Chest pain Active Problems:   DM2 (diabetes mellitus, type 2)   HTN (hypertension)   CAD S/P percutaneous coronary angioplasty   High cholesterol   This is a no charge progress note. Patient seen and examined earlier today by my colleague Dr. Alcario Drought. I evaluated by examined the patient as documented below. Patient presented with chest pain, evaluated early in the morning denies any chest pain. Circle 3 sets of cardiac enzymes, obtain 2-D echo. Cards to eval.   Code Status: Full Code Family Communication: Plan discussed with the patient. Disposition Plan: Remains inpatient Diet: Diet Carb Modified  Consultants:  None  Procedures:  None  Antibiotics:  None   Objective: Filed Vitals:   02/23/14 1300  BP: 154/56  Pulse: 58  Temp:   Resp: 10   No intake or output data in the 24 hours ending 02/23/14 1359 There were no vitals filed for this visit.  Exam: General: Alert and awake, oriented x3, not in any acute distress. HEENT: anicteric sclera, pupils reactive to light and accommodation, EOMI CVS: S1-S2 clear, no murmur rubs or gallops Chest: clear to auscultation bilaterally, no wheezing, rales or rhonchi Abdomen: soft nontender, nondistended, normal bowel sounds, no organomegaly Extremities: no cyanosis, clubbing or edema noted bilaterally Neuro: Cranial nerves II-XII intact, no focal neurological deficits  Data Reviewed: Basic Metabolic Panel:  Recent Labs Lab 02/22/14 2325  NA 139  K 3.5  CL 103  CO2 30  GLUCOSE 143*  BUN 17  CREATININE 1.00  CALCIUM 8.7   Liver Function Tests: No results for input(s): AST, ALT, ALKPHOS, BILITOT, PROT, ALBUMIN in the last 168 hours. No  results for input(s): LIPASE, AMYLASE in the last 168 hours. No results for input(s): AMMONIA in the last 168 hours. CBC:  Recent Labs Lab 02/22/14 2325  WBC 8.2  HGB 10.1*  HCT 32.2*  MCV 82.8  PLT 204   Cardiac Enzymes:  Recent Labs Lab 02/23/14 0059 02/23/14 0343 02/23/14 0646  TROPONINI <0.03 <0.03 <0.03   BNP (last 3 results) No results for input(s): BNP in the last 8760 hours.  ProBNP (last 3 results) No results for input(s): PROBNP in the last 8760 hours.  CBG:  Recent Labs Lab 02/23/14 0122 02/23/14 0614 02/23/14 1035 02/23/14 1153  GLUCAP 141* 94 83 84    Micro No results found for this or any previous visit (from the past 240 hour(s)).   Studies: Dg Chest Port 1 View  02/22/2014   CLINICAL DATA:  Midchest pain  EXAM: PORTABLE CHEST - 1 VIEW  COMPARISON:  11/28/2013  FINDINGS: A single AP portable view of the chest demonstrates no focal airspace consolidation or alveolar edema. The lungs are grossly clear. There is no large effusion or pneumothorax. There is unchanged cardiomegaly. Cardiac and mediastinal contours are otherwise unremarkable.  IMPRESSION: No active disease.   Electronically Signed   By: Andreas Newport M.D.   On: 02/22/2014 23:44    Scheduled Meds: . amLODipine  10 mg Oral Daily  . aspirin  324 mg Oral Once  . aspirin EC  81 mg Oral Daily  . atorvastatin  20 mg Oral Daily  . budesonide-formoterol  2 puff Inhalation BID  . busPIRone  15 mg Oral BID  . carvedilol  12.5 mg Oral BID WC  . escitalopram  10 mg Oral Daily  . heparin  5,000 Units Subcutaneous 3 times per day  . insulin aspart  0-9 Units Subcutaneous 6 times per day  . insulin glargine  20 Units Subcutaneous Daily  . levothyroxine  125 mcg Oral QAC breakfast  . montelukast  10 mg Oral QHS  . pantoprazole  40 mg Oral Daily  . rOPINIRole  1 mg Oral TID  . sertraline  100 mg Oral Daily  . tiotropium  18 mcg Inhalation Daily   Continuous Infusions:      Time spent:  35 minutes    [facility] A  [facility] Pager [phone] If 7PM-7AM, please contact night-coverage at [email or web address], password [facility] 02/23/2014, 1:59 PM

## 2014-02-23 NOTE — Care Management Note (Unsigned)
    Page 1 of 1   02/23/2014     4:31:20 PM CARE MANAGEMENT NOTE 02/23/2014  Patient:  Bonnie Henry, Bonnie Henry   Account Number:  [id number]  Date Initiated:  02/23/2014  Documentation initiated by:  GRAVES-BIGELOW,Rochelle Nephew  Subjective/Objective Assessment:   Pt admitted for Chest pain. Pt is from home with family support.     Action/Plan:   Referral received for financial difficulty for meds. Pt has insurance and co pay is expensive. CM did provide pt with Information on the Medicare [place] and for her to call for assistance.   Anticipated DC Date:  02/25/2014   Anticipated DC Plan:  [place]  CM consult      Choice offered to / List presented to:             Status of service:   Medicare Important Message given?   (If response is "NO", the following Medicare IM given date fields will be blank) Date Medicare IM given:   Medicare IM given by:   Date Additional Medicare IM given:   Additional Medicare IM given by:    Discharge Disposition:    Per UR Regulation:  Reviewed for med. necessity/level of care/duration of stay  If discussed at [place] of Stay Meetings, dates discussed:    Comments:  02-23-14 1628 Jacqlyn Krauss, RN,BSN [phone] CM did provide pt with [place]. Patient Assistance Forms to take to her PCP to fill out. Pt may be eligible for year supply if approved. No further needs identified from CM at this time.

## 2014-02-23 NOTE — ED Notes (Signed)
The patient is asleep and dropping her oxygen levels to the high 80's.  She was placed on 2L nasal cannula.  Her oxygen levels are 100% now.

## 2014-02-23 NOTE — Consult Note (Signed)
Reason for Consult:   Chest pain  Requesting Physician: Dr Alcario Drought  HPI: This is a 74 y.o. female with a past medical history significant for DM with neuropathy, HTN, COPD, and PVD. She also has CAD, s/p prior PCI and stenting in [place], (she told me 10 years ago) with a cath 4 years ago that was OK. She is followed by Dr Raliegh Ip. Fath in [place]. No functional studies done since cath 4 years ago. She noted nausea yesterday am followed by a "coughing spell". She then had intermittent SSCP all day. Yesterday PM her symptoms worsened and she developed mid sternal "burning" with radiation to her shoulder. She came to the ED at [facility]. Her symptoms were improved with SL NTG. Troponin has been negative x 3. EKG on admission shows no acute changes.   PMHx:  Past Medical History  Diagnosis Date  . Hypertension   . GERD (gastroesophageal reflux disease)   . CAD (coronary artery disease)   . Hypercholesteremia   . COPD (chronic obstructive pulmonary disease)   . Morbid obesity   . Post-surgical hypothyroidism   . Depression, major   . Neuropathy   . Myocardial infarction 1980's  . Asthma   . Sleep apnea     "2015 wore mask; lost weight; told didn't need mask anymore" (02/23/2014)  . Type II diabetes mellitus   . Anxiety   . Anemia   . History of blood transfusion     "when I had my mastectomy"  . Arthritis     "joints ache all over"   . Chronic back pain   . Breast cancer, left breast     S/P mastectomy    Past Surgical History  Procedure Laterality Date  . Bartholin cyst marsupialization    . Trigger finger release Left   . Cataract extraction w/ intraocular lens  implant, bilateral Bilateral ~ 2011  . Total knee arthroplasty Left 1990's  . Total hip arthroplasty Bilateral 1990's  . Thyroidectomy, partial  1950's  . Shoulder surgery Right     "cleaned it out; no scope"  . Toe surgery Right     "cut piece of bone out so toe didn't dig into my foot"  . Carotid  endarterectomy Left 2009  . Hammer toe surgery Left     2nd and 3rd digits  . Tonsillectomy  1950's  . Joint replacement    . Breast biopsy Left   . Mastectomy Left 1990's  . Tubal ligation    . Vaginal hysterectomy      "partial"  . Coronary angioplasty with stent placement  1990's X 2    "2; 1"  . Cardiac catheterization  2000's    SOCHx:  reports that she has quit smoking. Her smoking use included Cigarettes. She has a 66 pack-year smoking history. She has never used smokeless tobacco. She reports that she drinks alcohol. She reports that she does not use illicit drugs.  FAMHx: Family History  Problem Relation Age of Onset  . Arthritis Mother   . Cancer Mother   . Diabetes Mother   . Heart disease Mother   . Hyperlipidemia Mother   . Hypertension Mother   . Alcohol abuse Father   . Alcohol abuse Brother   . Arthritis Brother   . Asthma Brother   . HIV Brother   . Cancer Brother   . Diabetes Brother   . Hyperlipidemia Brother   . Hypertension Brother   . Lung  disease Brother   . Arthritis Maternal Grandmother     ALLERGIES: Allergies  Allergen Reactions  . Codeine Nausea And Vomiting  . Enalapril Other (See Comments)    Angioedema  . Morphine And Related Nausea And Vomiting    ROS: Pertinent items are noted in HPI. see H&P for complete details. No history of CVA. She has a history of neuropathy. She does say she was diagnosed with sleep apnea in the past but lost wgt and was able to come off C-pap. She has since gained that wgt back.  HOME MEDICATIONS: Prior to Admission medications   Medication Sig Start Date End Date Taking? Authorizing Provider  albuterol (PROAIR HFA) 108 (90 BASE) MCG/ACT inhaler Inhale 2 puffs into the lungs every 4 (four) hours as needed for wheezing or shortness of breath.    Yes Historical Provider, MD  amLODipine (NORVASC) 10 MG tablet Take 10 mg by mouth daily.   Yes Historical Provider, MD  aspirin EC 81 MG tablet Take 81 mg by  mouth daily.   Yes Historical Provider, MD  atorvastatin (LIPITOR) 20 MG tablet Take 20 mg by mouth daily.   Yes Historical Provider, MD  budesonide-formoterol (SYMBICORT) 160-4.5 MCG/ACT inhaler Inhale 2 puffs into the lungs 2 (two) times daily. RINSE MOUTH AFTER EACH USE   Yes Historical Provider, MD  bumetanide (BUMEX) 1 MG tablet Take 1 mg by mouth daily.   Yes Historical Provider, MD  busPIRone (BUSPAR) 15 MG tablet Take 15 mg by mouth 2 (two) times daily.   Yes Historical Provider, MD  carvedilol (COREG) 12.5 MG tablet Take 12.5 mg by mouth 2 (two) times daily with a meal.   Yes Historical Provider, MD  escitalopram (LEXAPRO) 10 MG tablet Take 10 mg by mouth daily.   Yes Historical Provider, MD  insulin aspart (NOVOLOG) 100 UNIT/ML injection Inject 5 Units into the skin daily as needed (if sugar is up take 5 units).    Yes Historical Provider, MD  insulin glargine (LANTUS) 100 UNIT/ML injection Inject 40 Units into the skin daily.   Yes Historical Provider, MD  levothyroxine (SYNTHROID, LEVOTHROID) 125 MCG tablet Take 125 mcg by mouth daily before breakfast.   Yes Historical Provider, MD  montelukast (SINGULAIR) 10 MG tablet Take 10 mg by mouth at bedtime.   Yes Historical Provider, MD  Omega-3 Fatty Acids (FISH OIL) 1000 MG CAPS Take 1 capsule by mouth daily.    Yes Historical Provider, MD  omeprazole (PRILOSEC) 20 MG capsule Take 20 mg by mouth daily.   Yes Historical Provider, MD  oxyCODONE-acetaminophen (PERCOCET/ROXICET) 5-325 MG per tablet Take 1 tablet by mouth at bedtime as needed for severe pain.   Yes Historical Provider, MD  PRESCRIPTION MEDICATION Take 1 tablet by mouth daily. iron   Yes Historical Provider, MD  rOPINIRole (REQUIP) 1 MG tablet Take 1 mg by mouth 3 (three) times daily.   Yes Historical Provider, MD  sertraline (ZOLOFT) 100 MG tablet Take 100 mg by mouth daily.   Yes Historical Provider, MD  spironolactone (ALDACTONE) 25 MG tablet Take 25 mg by mouth daily.   Yes  Historical Provider, MD  tiotropium (SPIRIVA HANDIHALER) 18 MCG inhalation capsule Place 18 mcg into inhaler and inhale daily.   Yes Historical Provider, MD    HOSPITAL MEDICATIONS: I have reviewed the patient's current medications.  VITALS: Blood pressure 154/56, pulse 58, temperature 98 F (36.7 C), temperature source Oral, resp. rate 10, SpO2 100 %.  PHYSICAL EXAM: General appearance: alert,  cooperative, no distress and morbidly obese Neck: No JVD, LCE scar and bruit Lungs: expiratory wheezing bilat Heart: regular rate and rhythm and 2/6 systolic murmur AOV and LSB Abdomen: obese, mid abd transmitted murmur Extremities: no edema Pulses: 2+ and symmetric Skin: pale, cool, and dry Neurologic: Grossly normal  LABS: Results for orders placed or performed during the hospital encounter of 02/22/14 (from the past 24 hour(s))  Basic metabolic panel     Status: Abnormal   Collection Time: 02/22/14 11:25 PM  Result Value Ref Range   Sodium 139 135 - 145 mmol/L   Potassium 3.5 3.5 - 5.1 mmol/L   Chloride 103 96 - 112 mmol/L   CO2 30 19 - 32 mmol/L   Glucose, Bld 143 (H) 70 - 99 mg/dL   BUN 17 6 - 23 mg/dL   Creatinine, Ser 1.00 0.50 - 1.10 mg/dL   Calcium 8.7 8.4 - 10.5 mg/dL   GFR calc non Af Amer 55 (L) >90 mL/min   GFR calc Af Amer 64 (L) >90 mL/min   Anion gap 6 5 - 15  CBC     Status: Abnormal   Collection Time: 02/22/14 11:25 PM  Result Value Ref Range   WBC 8.2 4.0 - 10.5 K/uL   RBC 3.89 3.87 - 5.11 MIL/uL   Hemoglobin 10.1 (L) 12.0 - 15.0 g/dL   HCT 32.2 (L) 36.0 - 46.0 %   MCV 82.8 78.0 - 100.0 fL   MCH 26.0 26.0 - 34.0 pg   MCHC 31.4 30.0 - 36.0 g/dL   RDW 15.9 (H) 11.5 - 15.5 %   Platelets 204 150 - 400 K/uL  I-stat troponin, ED     Status: None   Collection Time: 02/22/14 11:48 PM  Result Value Ref Range   Troponin i, poc 0.01 0.00 - 0.08 ng/mL   Comment 3          Troponin I-serum (0, 3, 6 hours)     Status: None   Collection Time: 02/23/14 12:59 AM    Result Value Ref Range   Troponin I <0.03 <0.031 ng/mL  CBG monitoring, ED     Status: Abnormal   Collection Time: 02/23/14  1:22 AM  Result Value Ref Range   Glucose-Capillary 141 (H) 70 - 99 mg/dL  Troponin I-serum (0, 3, 6 hours)     Status: None   Collection Time: 02/23/14  3:43 AM  Result Value Ref Range   Troponin I <0.03 <0.031 ng/mL  CBG monitoring, ED     Status: None   Collection Time: 02/23/14  6:14 AM  Result Value Ref Range   Glucose-Capillary 94 70 - 99 mg/dL  Troponin I-serum (0, 3, 6 hours)     Status: None   Collection Time: 02/23/14  6:46 AM  Result Value Ref Range   Troponin I <0.03 <0.031 ng/mL  CBG monitoring, ED     Status: None   Collection Time: 02/23/14 10:35 AM  Result Value Ref Range   Glucose-Capillary 83 70 - 99 mg/dL  CBG monitoring, ED     Status: None   Collection Time: 02/23/14 11:53 AM  Result Value Ref Range   Glucose-Capillary 84 70 - 99 mg/dL    EKG: NSR no acute changes  IMAGING: Dg Chest Port 1 View  02/22/2014   CLINICAL DATA:  Midchest pain  EXAM: PORTABLE CHEST - 1 VIEW  COMPARISON:  11/28/2013  FINDINGS: A single AP portable view of the chest demonstrates no focal airspace consolidation or  alveolar edema. The lungs are grossly clear. There is no large effusion or pneumothorax. There is unchanged cardiomegaly. Cardiac and mediastinal contours are otherwise unremarkable.  IMPRESSION: No active disease.   Electronically Signed   By: Andreas Newport M.D.   On: 02/22/2014 23:44    IMPRESSION: Principal Problem:   Unstable angina Active Problems:   CAD S/P remote PCI    Type 2 diabetes with nephropathy   COPD (chronic obstructive pulmonary disease)   HTN (hypertension)   High cholesterol   PVD- h/o LCEA   Obesity (BMI 30-39.9)   RECOMMENDATION: MD to see. She has multiple risk factors for progression of CAD and convincing symptoms. May be best to proceed directly to cath (as opposed to [place]). She also has moderate AS on  exam. Check echo. Treat COPD.   Time Spent Directly with Patient: 45 minutes  Erlene Quan [phone] beeper 02/23/2014, 3:40 PM   The patient was seen, examined and discussed with Kerin Ransom, PA-C and I agree with the above.   74 year old female with known CAD, s/p PCI approximately 10 years ago in [place], coming with symptoms of unstable angina - chest pain/pressures radiating to her shoulder associated with nausea. Her pain resolved with NTG.  She is also wheezing, being treated by primary team. Crea normal, we will place on a cath schedule for tomorrow. Add ACEI/ARB after  The cath.  Dorothy Spark 02/23/2014

## 2014-02-23 NOTE — Progress Notes (Signed)
UR completed 

## 2014-02-23 NOTE — ED Notes (Signed)
The patient had an episode of Sinus Tach HR up to 141. The patient was asymptomatic asleep with no complaints.  Called the Admitting physician and no orders were given.

## 2014-02-23 NOTE — Assessment & Plan Note (Signed)
3 stents

## 2014-02-24 ENCOUNTER — Encounter (HOSPITAL_COMMUNITY)
Admission: EM | Disposition: A | Discharge status: Home / Self Care | Payer: Self-pay | Source: Home / Self Care | Attending: Emergency Medicine

## 2014-02-24 DIAGNOSIS — I25110 Atherosclerotic heart disease of native coronary artery with unstable angina pectoris: Secondary | ICD-10-CM | POA: Diagnosis not present

## 2014-02-24 DIAGNOSIS — I200 Unstable angina: Secondary | ICD-10-CM | POA: Diagnosis not present

## 2014-02-24 DIAGNOSIS — I471 Supraventricular tachycardia: Secondary | ICD-10-CM | POA: Diagnosis not present

## 2014-02-24 DIAGNOSIS — R011 Cardiac murmur, unspecified: Secondary | ICD-10-CM | POA: Diagnosis not present

## 2014-02-24 HISTORY — PX: LEFT HEART CATHETERIZATION WITH CORONARY ANGIOGRAM: SHX5451

## 2014-02-24 LAB — CBC
HCT: 31.7 % — ABNORMAL LOW (ref 36.0–46.0)
HEMATOCRIT: 31.2 % — AB (ref 36.0–46.0)
HEMOGLOBIN: 9.7 g/dL — AB (ref 12.0–15.0)
Hemoglobin: 9.8 g/dL — ABNORMAL LOW (ref 12.0–15.0)
MCH: 26.4 pg (ref 26.0–34.0)
MCH: 26.1 pg (ref 26.0–34.0)
MCHC: 30.9 g/dL (ref 30.0–36.0)
MCHC: 31.1 g/dL (ref 30.0–36.0)
MCV: 85.4 fL (ref 78.0–100.0)
MCV: 83.9 fL (ref 78.0–100.0)
PLATELETS: 180 10*3/uL (ref 150–400)
Platelets: 169 10*3/uL (ref 150–400)
RBC: 3.71 MIL/uL — AB (ref 3.87–5.11)
RBC: 3.72 MIL/uL — AB (ref 3.87–5.11)
RDW: 15.8 % — ABNORMAL HIGH (ref 11.5–15.5)
RDW: 15.9 % — ABNORMAL HIGH (ref 11.5–15.5)
WBC: 5.5 10*3/uL (ref 4.0–10.5)
WBC: 5.6 10*3/uL (ref 4.0–10.5)

## 2014-02-24 LAB — GLUCOSE, CAPILLARY
GLUCOSE-CAPILLARY: 139 mg/dL — AB (ref 70–99)
GLUCOSE-CAPILLARY: 155 mg/dL — AB (ref 70–99)
Glucose-Capillary: 167 mg/dL — ABNORMAL HIGH (ref 70–99)
Glucose-Capillary: 140 mg/dL — ABNORMAL HIGH (ref 70–99)
Glucose-Capillary: 116 mg/dL — ABNORMAL HIGH (ref 70–99)
Glucose-Capillary: 149 mg/dL — ABNORMAL HIGH (ref 70–99)

## 2014-02-24 LAB — BASIC METABOLIC PANEL
Anion gap: 6 (ref 5–15)
BUN: 16 mg/dL (ref 6–23)
CALCIUM: 8.7 mg/dL (ref 8.4–10.5)
CO2: 28 mmol/L (ref 19–32)
Chloride: 106 mmol/L (ref 96–112)
Creatinine, Ser: 0.98 mg/dL (ref 0.50–1.10)
GFR, EST AFRICAN AMERICAN: 66 mL/min — AB (ref >90)
GFR, EST NON AFRICAN AMERICAN: 57 mL/min — AB (ref >90)
GLUCOSE: 160 mg/dL — AB (ref 70–99)
Potassium: 3.9 mmol/L (ref 3.5–5.1)
SODIUM: 140 mmol/L (ref 135–145)

## 2014-02-24 LAB — CREATININE, SERUM
Creatinine, Ser: 1.07 mg/dL (ref 0.50–1.10)
GFR calc non Af Amer: 51 mL/min — ABNORMAL LOW (ref >90)
GFR, EST AFRICAN AMERICAN: 59 mL/min — AB (ref >90)

## 2014-02-24 LAB — PROTIME-INR
INR: 0.99 (ref 0.00–1.49)
Prothrombin Time: 13.2 seconds (ref 11.6–15.2)

## 2014-02-24 SURGERY — LEFT HEART CATHETERIZATION WITH CORONARY ANGIOGRAM

## 2014-02-24 MED ORDER — LIDOCAINE HCL (PF) 1 % IJ SOLN
INTRAMUSCULAR | Status: AC
  Filled 2014-02-24: qty 30

## 2014-02-24 MED ORDER — HEPARIN SODIUM (PORCINE) 1000 UNIT/ML IJ SOLN
INTRAMUSCULAR | Status: AC
  Filled 2014-02-24: qty 1

## 2014-02-24 MED ORDER — HEPARIN (PORCINE) IN NACL 2-0.9 UNIT/ML-% IJ SOLN
INTRAMUSCULAR | Status: AC
  Filled 2014-02-24: qty 1000

## 2014-02-24 MED ORDER — FENTANYL CITRATE 0.05 MG/ML IJ SOLN
INTRAMUSCULAR | Status: AC
  Filled 2014-02-24: qty 2

## 2014-02-24 MED ORDER — NITROGLYCERIN 1 MG/10 ML FOR IR/CATH LAB
INTRA_ARTERIAL | Status: AC
  Filled 2014-02-24: qty 10

## 2014-02-24 MED ORDER — LEVALBUTEROL HCL 0.63 MG/3ML IN NEBU
0.6300 mg | INHALATION_SOLUTION | Freq: Four times a day (QID) | RESPIRATORY_TRACT | Status: DC
  Administered 2014-02-24 – 2014-02-25 (×3): 0.63 mg via RESPIRATORY_TRACT
  Filled 2014-02-24 (×4): qty 3

## 2014-02-24 MED ORDER — VERAPAMIL HCL 2.5 MG/ML IV SOLN
INTRAVENOUS | Status: AC
  Filled 2014-02-24: qty 2

## 2014-02-24 MED ORDER — MIDAZOLAM HCL 2 MG/2ML IJ SOLN
INTRAMUSCULAR | Status: AC
  Filled 2014-02-24: qty 2

## 2014-02-24 MED ORDER — SODIUM CHLORIDE 0.9 % IV SOLN: 1.0000 mL/kg/h | INTRAVENOUS | Status: AC

## 2014-02-24 MED ORDER — HEPARIN SODIUM (PORCINE) 5000 UNIT/ML IJ SOLN
5000.0000 [IU] | Freq: Three times a day (TID) | INTRAMUSCULAR | Status: DC
  Administered 2014-02-25: 5000 [IU] via SUBCUTANEOUS
  Filled 2014-02-24 (×3): qty 1

## 2014-02-24 NOTE — Progress Notes (Signed)
Echocardiogram 2D Echocardiogram has been performed.  02/24/2014 3:26 PM Maudry Mayhew, RVT, RDCS, RDMS

## 2014-02-24 NOTE — Progress Notes (Signed)
Pt had 22 beats SVT.  PA Baltazar Najjar paged and made aware.  Pt resting comfortably.  Will continue to monitor closely.  Claudette Stapler, RN

## 2014-02-24 NOTE — Progress Notes (Signed)
Subjective:  Some chest pain earlier this am, none now  . amLODipine  10 mg Oral Daily  . aspirin  324 mg Oral Once  . aspirin EC  81 mg Oral Daily  . atorvastatin  20 mg Oral Daily  . budesonide-formoterol  2 puff Inhalation BID  . busPIRone  15 mg Oral BID  . escitalopram  10 mg Oral Daily  . heparin  5,000 Units Subcutaneous 3 times per day  . insulin aspart  0-9 Units Subcutaneous 6 times per day  . insulin glargine  20 Units Subcutaneous Daily  . levalbuterol  0.63 mg Nebulization Q6H  . levothyroxine  125 mcg Oral QAC breakfast  . montelukast  10 mg Oral QHS  . nebivolol  5 mg Oral Daily  . pantoprazole  40 mg Oral Daily  . pneumococcal 23 valent vaccine  0.5 mL Intramuscular Tomorrow-1000  . rOPINIRole  1 mg Oral TID  . sertraline  100 mg Oral Daily  . tiotropium  18 mcg Inhalation Daily    Objective:  Vital Signs in the last 24 hours: Temp:  [97.6 F (36.4 C)-98.2 F (36.8 C)] 98.2 F (36.8 C) (02/19 0456) Pulse Rate:  [56-61] 56 (02/19 0456) Resp:  [10-21] 18 (02/19 0456) BP: (101-154)/(52-57) 101/54 mmHg (02/19 0456) SpO2:  [97 %-100 %] 97 % (02/19 0456) Weight:  [193 lb 12.6 oz (87.9 kg)] 193 lb 12.6 oz (87.9 kg) (02/19 0855)  Intake/Output from previous day:  Intake/Output Summary (Last 24 hours) at 02/24/14 1213 Last data filed at 02/24/14 0745  Gross per 24 hour  Intake    360 ml  Output   1901 ml  Net  -1541 ml    Physical Exam: General appearance: alert, cooperative, no distress and moderately obese Lungs: faint expiratory wheezing, better than yesterday Heart: regular rate and rhythm   Rate: 58  Rhythm: normal sinus rhythm and she has had runs of PAT (possibly flutter)  Lab Results:  Recent Labs  02/22/14 2325 02/24/14 0528  WBC 8.2 5.5  HGB 10.1* 9.8*  PLT 204 180    Recent Labs  02/22/14 2325 02/24/14 0528  NA 139 140  K 3.5 3.9  CL 103 106  CO2 30 28  GLUCOSE 143* 160*  BUN 17 16  CREATININE 1.00 0.98    Recent  Labs  02/23/14 0343 02/23/14 0646  TROPONINI <0.03 <0.03    Recent Labs  02/24/14 0528  INR 0.99    Imaging: Imaging results have been reviewed  Cardiac Studies:  Assessment/Plan:  74 y.o. female with a PMH significant for DM with neuropathy, HTN, COPD, and PVD. She also has CAD, s/p prior PCI and stenting in [place], (she told me 10 years ago) with a cath 4 years ago that was "OK". She is followed by Dr Raliegh Ip. Fath in [place]. No functional studies done since cath 4 years ago. She noted nausea 02/22/14 followed by a "coughing spell". She then had intermittent SSCP all day. Later on 2/17 her symptoms worsened and she developed mid sternal "burning" with radiation to her shoulder. She was admitted for further evaluation. Her Troponin has been negative.   Principal Problem:   Unstable angina Active Problems:   CAD S/P remote PCI    Type 2 diabetes with nephropathy   COPD (chronic obstructive pulmonary disease)   Aortic stenosis murmur   PAT (paroxysmal atrial tachycardia)   HTN (hypertension)   High cholesterol   PVD- h/o LCEA   Obesity (BMI 30-39.9)  PLAN: We changed her Coreg to Bystolic yesterday secondary to wheezing (which has improved). Hesitant to increase further. Consider changing Norvasc to Diltiazem. She is for cath today. Change Proventil to Xopenex. Document TSH  Kerin Ransom PA-C Murlean Hark [phone] 02/24/2014, 12:13 PM  The patient was seen, examined and discussed with Kerin Ransom, PA-C and I agree with the above.   74 year old female with known CAD, s/p PCI approximately 10 years ago in [place], coming with symptoms of unstable angina - chest pain/pressures radiating to her shoulder associated with nausea. Her pain resolved with NTG. Troponin negative x 2. ECG without ischemic changes. She is also wheezing, being treated by primary team.  Crea normal, awaiting cath today. BP elevated - add ACEI/ARB afterthe cath.   Dorothy Spark 02/24/2014

## 2014-02-24 NOTE — H&P (View-Only) (Signed)
Subjective:  Some chest pain earlier this am, none now  . amLODipine  10 mg Oral Daily  . aspirin  324 mg Oral Once  . aspirin EC  81 mg Oral Daily  . atorvastatin  20 mg Oral Daily  . budesonide-formoterol  2 puff Inhalation BID  . busPIRone  15 mg Oral BID  . escitalopram  10 mg Oral Daily  . heparin  5,000 Units Subcutaneous 3 times per day  . insulin aspart  0-9 Units Subcutaneous 6 times per day  . insulin glargine  20 Units Subcutaneous Daily  . levalbuterol  0.63 mg Nebulization Q6H  . levothyroxine  125 mcg Oral QAC breakfast  . montelukast  10 mg Oral QHS  . nebivolol  5 mg Oral Daily  . pantoprazole  40 mg Oral Daily  . pneumococcal 23 valent vaccine  0.5 mL Intramuscular Tomorrow-1000  . rOPINIRole  1 mg Oral TID  . sertraline  100 mg Oral Daily  . tiotropium  18 mcg Inhalation Daily    Objective:  Vital Signs in the last 24 hours: Temp:  [97.6 F (36.4 C)-98.2 F (36.8 C)] 98.2 F (36.8 C) (02/19 0456) Pulse Rate:  [56-61] 56 (02/19 0456) Resp:  [10-21] 18 (02/19 0456) BP: (101-154)/(52-57) 101/54 mmHg (02/19 0456) SpO2:  [97 %-100 %] 97 % (02/19 0456) Weight:  [193 lb 12.6 oz (87.9 kg)] 193 lb 12.6 oz (87.9 kg) (02/19 0855)  Intake/Output from previous day:  Intake/Output Summary (Last 24 hours) at 02/24/14 1213 Last data filed at 02/24/14 0745  Gross per 24 hour  Intake    360 ml  Output   1901 ml  Net  -1541 ml    Physical Exam: General appearance: alert, cooperative, no distress and moderately obese Lungs: faint expiratory wheezing, better than yesterday Heart: regular rate and rhythm   Rate: 58  Rhythm: normal sinus rhythm and she has had runs of PAT (possibly flutter)  Lab Results:  Recent Labs  02/22/14 2325 02/24/14 0528  WBC 8.2 5.5  HGB 10.1* 9.8*  PLT 204 180    Recent Labs  02/22/14 2325 02/24/14 0528  NA 139 140  K 3.5 3.9  CL 103 106  CO2 30 28  GLUCOSE 143* 160*  BUN 17 16  CREATININE 1.00 0.98    Recent  Labs  02/23/14 0343 02/23/14 0646  TROPONINI <0.03 <0.03    Recent Labs  02/24/14 0528  INR 0.99    Imaging: Imaging results have been reviewed  Cardiac Studies:  Assessment/Plan:  74 y.o. female with a PMH significant for DM with neuropathy, HTN, COPD, and PVD. She also has CAD, s/p prior PCI and stenting in [place], (she told me 10 years ago) with a cath 4 years ago that was "OK". She is followed by Dr Raliegh Ip. Fath in [place]. No functional studies done since cath 4 years ago. She noted nausea 02/22/14 followed by a "coughing spell". She then had intermittent SSCP all day. Later on 2/17 her symptoms worsened and she developed mid sternal "burning" with radiation to her shoulder. She was admitted for further evaluation. Her Troponin has been negative.   Principal Problem:   Unstable angina Active Problems:   CAD S/P remote PCI    Type 2 diabetes with nephropathy   COPD (chronic obstructive pulmonary disease)   Aortic stenosis murmur   PAT (paroxysmal atrial tachycardia)   HTN (hypertension)   High cholesterol   PVD- h/o LCEA   Obesity (BMI 30-39.9)  PLAN: We changed her Coreg to Bystolic yesterday secondary to wheezing (which has improved). Hesitant to increase further. Consider changing Norvasc to Diltiazem. She is for cath today. Change Proventil to Xopenex. Document TSH  Kerin Ransom PA-C Murlean Hark [phone] 02/24/2014, 12:13 PM  The patient was seen, examined and discussed with Kerin Ransom, PA-C and I agree with the above.   74 year old female with known CAD, s/p PCI approximately 10 years ago in [place], coming with symptoms of unstable angina - chest pain/pressures radiating to her shoulder associated with nausea. Her pain resolved with NTG. Troponin negative x 2. ECG without ischemic changes. She is also wheezing, being treated by primary team.  Crea normal, awaiting cath today. BP elevated - add ACEI/ARB afterthe cath.   Dorothy Spark 02/24/2014

## 2014-02-24 NOTE — Significant Event (Signed)
Patient will be under cardiology, [facility] hospitalist signed off.  Birdie Hopes Pager: [phone] 02/24/2014, 4:06 PM

## 2014-02-24 NOTE — Interval H&P Note (Signed)
History and Physical Interval Note:  02/24/2014 4:27 PM  Bonnie Henry  has presented today for surgery, with the diagnosis of Unstable Angina. The various methods of treatment have been discussed with the patient and family.  After consideration of risks, benefits and other options for treatment, the patient has consented to  Procedure(s): LEFT HEART CATHETERIZATION WITH CORONARY ANGIOGRAM (N/A) +/- PCI as a surgical intervention .  The patient's history has been reviewed, patient examined, no change in status, stable for surgery.  I have reviewed the patient's chart and labs.  Questions were answered to the patient's satisfaction.    Cath Lab Visit (complete for each Cath Lab visit)  Clinical Evaluation Leading to the Procedure:   ACS: Yes.    Non-ACS:    Anginal Classification: CCS IV  Anti-ischemic medical therapy: Maximal Therapy (2 or more classes of medications)  Non-Invasive Test Results: No non-invasive testing performed  Prior CABG: No previous CABG  TIMI SCORE  Patient Information:  TIMI Score is 5   A/NSTEMI and high-risk features for short-term risk of death or nonfatal MI Revascularization of the presumed culprit artery  A (9)  Indication: 11; Score: 9 TIMI SCORE  Patient Information:  TIMI Score is 5   A/NSTEMI and high-risk features for short-term risk of death or nonfatal MI Revascularization of multiple coronary arteries when the culprit artery cannot clearly be determined  A (9)  Indication: 12; Score: 9    Bonnie Henry

## 2014-02-24 NOTE — Progress Notes (Signed)
UR completed 

## 2014-02-24 NOTE — CV Procedure (Addendum)
CARDIAC CATHETERIZATION REPORT  NAME:  Rosalina Dingwall   MRN: [id number] DOB:  1940-08-22   ADMIT DATE: 02/22/2014 Procedure Date: 02/24/2014  INTERVENTIONAL CARDIOLOGIST: Leonie Man, M.D., MS PRIMARY CARE PROVIDER: LADA, Satira Anis, MD PRIMARY CARDIOLOGIST: New to [facility]; Ena Dawley, M.D. (Dr. Ubaldo Glassing [facility] [place])   PATIENT:  Juley Giovanetti is a 74 y.o. female with prior CAD (PCI while living in [place] in roughly 2006, 3 stents), type 2 diabetes mellitus with nephropathy, hypertension and COPD who was previously followed by Dr. Ubaldo Glassing in [place].  She has been noticing intermittent substernal chest pain for several days with a midsternal burning sensation radiating to her shoulder. The symptoms were relieved with nitroglycerin. She is ruled out for myocardial infarction, however these symptoms are concerning for unstable angina. She is therefore referred for invasive evaluation with cardiac catheterization.  PRE-OPERATIVE DIAGNOSIS:    Chest Pain concerning for Unstable Angina  Known CAD status post three-vessel PCI in the past.  PROCEDURES PERFORMED:    Left Heart Catheterization with Native Coronary Angiography  via Right Radial Artery   Left Ventriculography  PROCEDURE: The patient was brought to the 2nd [place] [facility] Lab in the fasting state and prepped and draped in the usual sterile fashion for Right Radial artery access. A modified Allen's test was performed on the right wrist demonstrating excellent collateral flow for radial access.   Sterile technique was used including antiseptics, cap, gloves, gown, hand hygiene, mask and sheet. Skin prep: Chlorhexidine.   Consent: Risks of procedure as well as the alternatives and risks of each were explained to the (patient/caregiver). Consent for procedure obtained.   Time Out: Verified patient identification, verified procedure, site/side was marked, verified  correct patient position, special equipment/implants available, medications/allergies/relevent history reviewed, required imaging and test results available. Performed.  Access:   Right Radial Artery: 6 Fr Sheath -  Seldinger Technique (Angiocath Micropuncture Kit)  Radial Cocktail - 10 mL; IV Heparin 4500 Units   Left Heart Catheterization: 5 Fr Catheters advanced or exchanged over a long exchange safety J-wire; TIG 4.0 catheter advanced first.  Left & Right Coronary Artery Cineangiography: TIG 4.0 Catheter   LV Hemodynamics (LV Gram): Angled pigtail catheter  Sheath removed in the cardiac catheterization lab with TR band placement for hemostasis.  TR Band: 1710  Hours; 16 mL air  FINDINGS:  Hemodynamics:   Central Aortic Pressure / Mean: 133/53/81 mmHg  Left Ventricular Pressure / LVEDP: 150/11/26 mmHg  Left Ventriculography:  EF: ~50-55 %  Wall Motion: There is suggestion of mild mid anterolateral hypokinesis  Coronary Anatomy:  Dominance: Codominant  Left Main: Normal caliber vessel that bifurcates into the LAD and Circumflex with a tiny Ramus Intermedius. LAD: Normal caliber vessel with dispersed mild luminal irregularities proximally. The stent in the mid vessel is widely patent. There is a proximal diagonal branch that is moderate caliber and relatively free of disease. The second diagonal branch arises from the stented segment was free of disease. The LAD wraps down around the apex perfusing the distal inferoapex.  Left Circumflex: Large-caliber, codominant vessel with a patent proximal stent. It then bifurcates in the AV groove into a large lateral OM with several small marginal branches and bifurcating distally into 2 moderate caliber branches. Minimal disease just prior to the bifurcation the follow on AV groove circumflex gives off a small posterolateral branch and then terminates as a moderate caliber posterolateral branch.   RCA: Normal caliber likely codominant  vessel with a proximal stent that is widely patent. There are mild luminal irregularities throughout. The vessel terminates as the  Right Posterior Decending Artery (RPDA) with a small posterior AV groove branch that may give off the AV nodal artery. The PDA reaches most the way to the apex and is free of any significant disease. There may be mild ostial disease of ~20%.   MEDICATIONS:  Anesthesia:  Local Lidocaine 3 ml  Sedation:  1 mg IV Versed, 25 mcg IV fentanyl ;   Omnipaque Contrast: 75 ml  Anticoagulation:  IV Heparin 4500 Units Radial Cocktail: 5 mg Verapamil, 400 mcg NTG, 2 ml 2% Lidocaine in 10 ml NS  PATIENT DISPOSITION:    The patient was transferred to the PACU holding area in a hemodynamicaly stable, chest pain free condition.  The patient tolerated the procedure well, and there were no complications.  EBL:   < 10 ml  The patient was stable before, during, and after the procedure.  POST-OPERATIVE DIAGNOSIS:    Angiographically only minimal CAD with widely patent stents in the RCA, circumflex and LAD  Intermittent episodes of what appears to be SVT with rates in the low 100s followed by sinus bradycardia  Preserved LVEF with a suggestion of mild anterolateral hypokinesis  Elevated LVEDP  Likely mild aortic stenosis with a peak to peak gradient of 15 mmHg  PLAN OF CARE:  Return to nursing unit for post radial cath care.  Continue medical management of existing CAD and elevated LVEDP    HARDING, DAVID W, M.D., M.S. Interventional Cardiologist   Pager # [phone]    

## 2014-02-25 ENCOUNTER — Encounter (HOSPITAL_COMMUNITY): Payer: Self-pay | Admitting: Cardiology

## 2014-02-25 DIAGNOSIS — E1121 Type 2 diabetes mellitus with diabetic nephropathy: Secondary | ICD-10-CM | POA: Diagnosis not present

## 2014-02-25 DIAGNOSIS — J42 Unspecified chronic bronchitis: Secondary | ICD-10-CM | POA: Diagnosis not present

## 2014-02-25 DIAGNOSIS — I471 Supraventricular tachycardia: Secondary | ICD-10-CM

## 2014-02-25 DIAGNOSIS — I25110 Atherosclerotic heart disease of native coronary artery with unstable angina pectoris: Secondary | ICD-10-CM | POA: Diagnosis not present

## 2014-02-25 DIAGNOSIS — I350 Nonrheumatic aortic (valve) stenosis: Secondary | ICD-10-CM | POA: Diagnosis not present

## 2014-02-25 DIAGNOSIS — I200 Unstable angina: Secondary | ICD-10-CM | POA: Diagnosis not present

## 2014-02-25 LAB — GLUCOSE, CAPILLARY
GLUCOSE-CAPILLARY: 140 mg/dL — AB (ref 70–99)
GLUCOSE-CAPILLARY: 147 mg/dL — AB (ref 70–99)
GLUCOSE-CAPILLARY: 182 mg/dL — AB (ref 70–99)

## 2014-02-25 LAB — TSH: TSH: 0.352 u[IU]/mL (ref 0.350–4.500)

## 2014-02-25 MED ORDER — LOSARTAN POTASSIUM 25 MG PO TABS
25.0000 mg | ORAL_TABLET | Freq: Every day | ORAL | Status: DC
  Filled 2014-02-25: qty 1

## 2014-02-25 MED ORDER — NEBIVOLOL HCL 5 MG PO TABS: 5.0000 mg | ORAL_TABLET | Freq: Every day | ORAL | Status: DC

## 2014-02-25 MED ORDER — PNEUMOCOCCAL VAC POLYVALENT 25 MCG/0.5ML IJ INJ
0.5000 mL | INJECTION | INTRAMUSCULAR | Status: AC
  Administered 2014-02-25: 0.5 mL via INTRAMUSCULAR
  Filled 2014-02-25: qty 0.5

## 2014-02-25 NOTE — Progress Notes (Signed)
UR completed 

## 2014-02-25 NOTE — Discharge Summary (Signed)
Discharge Summary   Patient ID: Bonnie Henry MRN: [id number], DOB/AGE: 04/08/1940 74 y.o. Admit date: 02/22/2014 D/C date:     02/25/2014  Primary Cardiologist:  Dr. Ubaldo Glassing in [place]  Principal Problem:   Unstable angina Active Problems:   Type 2 diabetes with nephropathy   HTN (hypertension)   CAD S/P remote PCI    High cholesterol   COPD (chronic obstructive pulmonary disease)   PVD- h/o LCEA   Obesity (BMI 30-39.9)   Aortic stenosis murmur   PAT (paroxysmal atrial tachycardia)    Admission Dates: 02/22/14-02/25/14 Discharge Diagnosis: chest pain s/p LHC with no obst CAD and patent stents. Continued medical therapy  HPI: Bonnie Henry is a 74 y.o. female with a history of DM with neuropathy, HTN, COPD, PVD, HLD and CAD s/p prior PCI/stenting x3 in [place] (~ 10 years ago) who presented to the ED at [facility] on 02/23/14 with chest pain concerning for [place].  She has been noticing intermittent substernal chest pain for several days with a midsternal burning sensation radiating to her shoulder. The symptoms were relieved with nitroglycerin. She is ruled out for myocardial infarction, however these symptoms are concerning for unstable angina. She is therefore referred for invasive evaluation with cardiac catheterization.  Hospital Course  Chest pain- -- Troponin negative x 3. EKG w/ no acute changes.  -- S/p LHC on 02/24/14 which revealed minimal CAD with widely patent stents in the RCA, LCx and LAD. Preserved LVEF with suggestion of mild anterolat HK, elevated LVEDP and mild AS with peak to peak gradient of 43mm Hg. -- Recommended to continue medical Rx of CAD and elevated LVEDP. Evoluemic on exam so not given diuretics.  -- Continue ASA, statin, BB -- Followed by Dr. Ubaldo Glassing (Cardiology) in [facility]   AS- mild. LHC with mild AS with peak to peak gradient of 55mm Hg. 2D ECHO with poor visualization of aortic valve but some thickening suggestive of mild AS.  HTN -- Continue  amlodipine 10mg . Coreg changed to Bystolic secondary to wheezing (which has improved).  -- Dr Meda Coffee recommended to start ACE/ARB after LHC. BP normal/soft. Would consider adding low dose losartan in outpt setting if BP remains stable (allegery to enalapril)  Possible SVT- Intermittent episodes of what appears to be SVT with rates in the low 100s followed by sinus bradycardia during her cardiac catheterization -- One very short burst last night but otherwise no arrhythmias. She is asymptomatic. Continue BB.  -- Consider outpt monitor with primary Cardiologist in [place].   COPD- Coreg changed to Bystolic secondary to wheezing (which has improved).  -- Continue home meds  Code status- she would like to be switched to DNR status   The patient has had an uncomplicated hospital course and is recovering well. The radial catheter site is stable. She has been seen by Dr. Radford Pax today and deemed ready for discharge home. Discharge medications are listed below.   Discharge Vitals: Blood pressure 110/50, pulse 52, temperature 98.4 F (36.9 C), temperature source Oral, resp. rate 18, height 5' (1.524 m), weight 193 lb 12.6 oz (87.9 kg), SpO2 96 %.  Labs: Lab Results  Component Value Date   WBC 5.6 02/24/2014   HGB 9.7* 02/24/2014   HCT 31.2* 02/24/2014   MCV 83.9 02/24/2014   PLT 169 02/24/2014     Recent Labs Lab 02/24/14 0528 02/24/14 2258  NA 140  --   K 3.9  --   CL 106  --   CO2 28  --  BUN 16  --   CREATININE 0.98 1.07  CALCIUM 8.7  --   GLUCOSE 160*  --     Recent Labs  02/23/14 0059 02/23/14 0343 02/23/14 0646  TROPONINI <0.03 <0.03 <0.03   No results found for: CHOL, HDL, LDLCALC, TRIG No results found for: DDIMER  Diagnostic Studies/Procedures   Dg Chest Port 1 View  02/22/2014   CLINICAL DATA:  Midchest pain  EXAM: PORTABLE CHEST - 1 VIEW  COMPARISON:  11/28/2013  FINDINGS: A single AP portable view of the chest demonstrates no focal airspace  consolidation or alveolar edema. The lungs are grossly clear. There is no large effusion or pneumothorax. There is unchanged cardiomegaly. Cardiac and mediastinal contours are otherwise unremarkable.  IMPRESSION: No active disease.   Electronically Signed   By: Andreas Newport M.D.   On: 02/22/2014 23:44      CARDIAC CATHETERIZATION REPORT  NAME: Bonnie Henry [id number] DOB: September 27, 1942ADMIT DATE: 02/22/2014 Procedure Date: 02/24/2014  INTERVENTIONAL CARDIOLOGIST: Leonie Man, M.D., MS PRIMARY CARE PROVIDER: LADA, Satira Anis, MD PRIMARY CARDIOLOGIST: New to [facility]; Ena Dawley, M.D. (Dr. Ubaldo Glassing [facility] [place])   PATIENT: Bonnie Henry is a 74 y.o. female with prior CAD (PCI while living in [place] in roughly 2006, 3 stents), type 2 diabetes mellitus with nephropathy, hypertension and COPD who was previously followed by Dr. Ubaldo Glassing in [place].  She has been noticing intermittent substernal chest pain for several days with a midsternal burning sensation radiating to her shoulder. The symptoms were relieved with nitroglycerin. She is ruled out for myocardial infarction, however these symptoms are concerning for unstable angina. She is therefore referred for invasive evaluation with cardiac catheterization.  FINDINGS:  Hemodynamics:   Central Aortic Pressure / Mean: 133/53/81 mmHg  Left Ventricular Pressure / LVEDP: 150/11/26 mmHg  Left Ventriculography:  EF: ~50-55 %  Wall Motion: There is suggestion of mild mid anterolateral hypokinesis  Coronary Anatomy:  Dominance: Codominant  Left Main: Normal caliber vessel that bifurcates into the LAD and Circumflex with a tiny Ramus Intermedius. LAD: Normal caliber vessel with dispersed mild luminal irregularities proximally. The stent in the mid vessel is widely patent. There is a proximal diagonal branch that is  moderate caliber and relatively free of disease. The second diagonal branch arises from the stented segment was free of disease. The LAD wraps down around the apex perfusing the distal inferoapex.  Left Circumflex: Large-caliber, codominant vessel with a patent proximal stent. It then bifurcates in the AV groove into a large lateral OM with several small marginal branches and bifurcating distally into 2 moderate caliber branches. Minimal disease just prior to the bifurcation the follow on AV groove circumflex gives off a small posterolateral branch and then terminates as a moderate caliber posterolateral branch.   RCA: Normal caliber likely codominant vessel with a proximal stent that is widely patent. There are mild luminal irregularities throughout. The vessel terminates as the Right Posterior Decending Artery (RPDA) with a small posterior AV groove branch that may give off the AV nodal artery. The PDA reaches most the way to the apex and is free of any significant disease. There may be mild ostial disease of ~20%. POST-OPERATIVE DIAGNOSIS:   Angiographically only minimal CAD with widely patent stents in the RCA, circumflex and LAD  Intermittent episodes of what appears to be SVT with rates in the low 100s followed by sinus bradycardia  Preserved LVEF with a suggestion of mild anterolateral hypokinesis  Elevated LVEDP  Likely mild aortic  stenosis with a peak to peak gradient of 15 mmHg  PLAN OF CARE:  Return to nursing unit for post radial cath care.  Continue medical management of existing CAD and elevated LVEDP      Transthoracic Echocardiography Study Date: 02/24/2014 LV EF: 60% Study Conclusions - Left ventricle: The cavity size was normal. Wall thickness was increased in a pattern of mild LVH. The estimated ejection fraction was 60%. Regional wall motion abnormalities cannot be excluded. Doppler parameters are consistent with high ventricular filling  pressure. - Aortic valve: Valve is poorly seen. There is some thickening. I suspect mild AS considering all data. Trace AI. - Left atrium: The atrium was mildly dilated. - Right ventricle: The cavity size was normal. Systolic function was normal.  Discharge Medications     Medication List    STOP taking these medications        COREG 12.5 MG tablet  Generic drug:  carvedilol      TAKE these medications        amLODipine 10 MG tablet  Commonly known as:  NORVASC  Take 10 mg by mouth daily.     aspirin EC 81 MG tablet  Take 81 mg by mouth daily.     atorvastatin 20 MG tablet  Commonly known as:  LIPITOR  Take 20 mg by mouth daily.     bumetanide 1 MG tablet  Commonly known as:  BUMEX  Take 1 mg by mouth daily.     busPIRone 15 MG tablet  Commonly known as:  BUSPAR  Take 15 mg by mouth 2 (two) times daily.     escitalopram 10 MG tablet  Commonly known as:  LEXAPRO  Take 10 mg by mouth daily.     Fish Oil 1000 MG Caps  Take 1 capsule by mouth daily.     LANTUS 100 UNIT/ML injection  Generic drug:  insulin glargine  Inject 40 Units into the skin daily.     levothyroxine 125 MCG tablet  Commonly known as:  SYNTHROID, LEVOTHROID  Take 125 mcg by mouth daily before breakfast.     montelukast 10 MG tablet  Commonly known as:  SINGULAIR  Take 10 mg by mouth at bedtime.     nebivolol 5 MG tablet  Commonly known as:  BYSTOLIC  Take 1 tablet (5 mg total) by mouth daily.     NOVOLOG 100 UNIT/ML injection  Generic drug:  insulin aspart  Inject 5 Units into the skin daily as needed (if sugar is up take 5 units).     omeprazole 20 MG capsule  Commonly known as:  PRILOSEC  Take 20 mg by mouth daily.     oxyCODONE-acetaminophen 5-325 MG per tablet  Commonly known as:  PERCOCET/ROXICET  Take 1 tablet by mouth at bedtime as needed for severe pain.     PRESCRIPTION MEDICATION  Take 1 tablet by mouth daily. iron     PROAIR HFA 108 (90 BASE) MCG/ACT inhaler   Generic drug:  albuterol  Inhale 2 puffs into the lungs every 4 (four) hours as needed for wheezing or shortness of breath.     rOPINIRole 1 MG tablet  Commonly known as:  REQUIP  Take 1 mg by mouth 3 (three) times daily.     sertraline 100 MG tablet  Commonly known as:  ZOLOFT  Take 100 mg by mouth daily.     SPIRIVA HANDIHALER 18 MCG inhalation capsule  Generic drug:  tiotropium  Place 18 mcg into  inhaler and inhale daily.     spironolactone 25 MG tablet  Commonly known as:  ALDACTONE  Take 25 mg by mouth daily.     SYMBICORT 160-4.5 MCG/ACT inhaler  Generic drug:  budesonide-formoterol  Inhale 2 puffs into the lungs 2 (two) times daily. RINSE MOUTH AFTER EACH USE        Disposition   The patient will be discharged in stable condition to home.  Follow-up Information    Follow up with Teodoro Spray., MD.   Specialty:  Cardiology   Why:  Please follow up with your regular cardiologist in the next 2-3 weeks   Contact information:   [place] [address] [phone]         Duration of Discharge Encounter: Greater than 30 minutes including physician and PA time.  CHERENE, DOBBINS R PA-C 02/25/2014, 10:34 AM

## 2014-02-25 NOTE — Progress Notes (Signed)
Patient Name: Bonnie Henry Date of Encounter: 02/25/2014     Principal Problem:   Unstable angina Active Problems:   Type 2 diabetes with nephropathy   HTN (hypertension)   CAD S/P remote PCI    High cholesterol   COPD (chronic obstructive pulmonary disease)   PVD- h/o LCEA   Obesity (BMI 30-39.9)   Aortic stenosis murmur   PAT (paroxysmal atrial tachycardia)   Pain in the chest    SUBJECTIVE  NO CP or SOB. Feeling better. Ready to go home.   CURRENT MEDS . amLODipine  10 mg Oral Daily  . aspirin  324 mg Oral Once  . aspirin EC  81 mg Oral Daily  . atorvastatin  20 mg Oral Daily  . budesonide-formoterol  2 puff Inhalation BID  . busPIRone  15 mg Oral BID  . escitalopram  10 mg Oral Daily  . heparin  5,000 Units Subcutaneous 3 times per day  . insulin aspart  0-9 Units Subcutaneous 6 times per day  . insulin glargine  20 Units Subcutaneous Daily  . levalbuterol  0.63 mg Nebulization Q6H  . levothyroxine  125 mcg Oral QAC breakfast  . montelukast  10 mg Oral QHS  . nebivolol  5 mg Oral Daily  . pantoprazole  40 mg Oral Daily  . pneumococcal 23 valent vaccine  0.5 mL Intramuscular Tomorrow-1000  . rOPINIRole  1 mg Oral TID  . sertraline  100 mg Oral Daily  . tiotropium  18 mcg Inhalation Daily    OBJECTIVE  Filed Vitals:   02/24/14 2115 02/24/14 2130 02/25/14 0111 02/25/14 0406  BP: 131/46 107/54  110/50  Pulse: 55 54  52  Temp:  98.3 F (36.8 C)  98.4 F (36.9 C)  TempSrc:  Oral  Oral  Resp:  18  18  Height:      Weight:      SpO2:  95% 99% 96%    Intake/Output Summary (Last 24 hours) at 02/25/14 0723 Last data filed at 02/25/14 0400  Gross per 24 hour  Intake    240 ml  Output   2900 ml  Net  -2660 ml   Filed Weights   02/24/14 0855  Weight: 193 lb 12.6 oz (87.9 kg)    PHYSICAL EXAM  General: Pleasant, NAD. obese Neuro: Alert and oriented X 3. Moves all extremities spontaneously. Psych: Normal affect. HEENT:  Normal  Neck:  Supple without bruits or JVD. Lungs:  Resp regular and unlabored, CTA. Heart: RRR no s3, s4, or + SEM  Abdomen: Soft, non-tender, non-distended, BS + x 4.  Extremities: No clubbing, cyanosis or edema. DP/PT/Radials 2+ and equal bilaterally.  Accessory Clinical Findings  CBC  Recent Labs  02/24/14 0528 02/24/14 2258  WBC 5.5 5.6  HGB 9.8* 9.7*  HCT 31.7* 31.2*  MCV 85.4 83.9  PLT 180 545   Basic Metabolic Panel  Recent Labs  02/22/14 2325 02/24/14 0528 02/24/14 2258  NA 139 140  --   K 3.5 3.9  --   CL 103 106  --   CO2 30 28  --   GLUCOSE 143* 160*  --   BUN 17 16  --   CREATININE 1.00 0.98 1.07  CALCIUM 8.7 8.7  --     Cardiac Enzymes  Recent Labs  02/23/14 0059 02/23/14 0343 02/23/14 0646  TROPONINI <0.03 <0.03 <0.03   Thyroid Function Tests  Recent Labs  02/25/14 0428  TSH 0.352  Transthoracic Echocardiography Study Date: 02/24/2014 LV EF: 60% Study Conclusions - Left ventricle: The cavity size was normal. Wall thickness was increased in a pattern of mild LVH. The estimated ejection fraction was 60%. Regional wall motion abnormalities cannot be excluded. Doppler parameters are consistent with high ventricular filling pressure. - Aortic valve: Valve is poorly seen. There is some thickening. I suspect mild AS considering all data. Trace AI. - Left atrium: The atrium was mildly dilated. - Right ventricle: The cavity size was normal. Systolic function was normal.     TELE  NSR with some sinus brady  Radiology/Studies  Dg Chest Port 1 View  02/22/2014   CLINICAL DATA:  Midchest pain  EXAM: PORTABLE CHEST - 1 VIEW  COMPARISON:  11/28/2013  FINDINGS: A single AP portable view of the chest demonstrates no focal airspace consolidation or alveolar edema. The lungs are grossly clear. There is no large effusion or pneumothorax. There is unchanged cardiomegaly. Cardiac and mediastinal contours are otherwise unremarkable.   IMPRESSION: No active disease.   Electronically Signed   By: Andreas Newport M.D.   On: 02/22/2014 23:44    ASSESSMENT AND PLAN  Bonnie Henry is a 74 y.o. female with a history of DM with neuropathy, HTN, COPD, PVD, HLD and CAD s/p prior PCI[place] x3 in [place] (~ 10 years ago) who presented to the ED at [facility] on 02/23/14 with chest pain concerning for [place].  Chest pain- -- Troponin negative x 3. EKG w/ no acute changes.   -- S/p LHC on 02/24/14 which revealed minimal CAD with widely patent stents in the RCA, LCx and LAD. Preserved LVEF with suggestion of mild anterolat HK, elevated LVEDP and mild AS with peak to peak gradient of 32mm Hg. -- Recommended to continue medical Rx of CAD and elevated LVEDP -- Continue ASA, statin, BB -- Followed by Dr. Ubaldo Glassing (Cardiology) in [facility]   AS- mild. LHC with mild AS with peak to peak gradient of 57mm Hg. 2D ECHO with poor visualization of aortic valve but some thickening suggestive of mild AS.  HTN -- Continue amlodipine 10mg . Coreg changed to Bystolic secondary to wheezing (which has improved).  -- Dr Meda Coffee recommended to start ACE/ARB after LHC. BP normal/soft. Would consider adding low dose losartan in outpt setting if BP remains stable (allegery to enalapril)  Possible SVT- Intermittent episodes of what appears to be SVT with rates in the low 100s followed by sinus bradycardia during her cardiac catheterization -- One very short burst last night but otherwise no arrhythmias.   She is asymptomatic.  Continue BB. Consider outpt monitor with primary Cardiologist in [place].    COPD- Coreg changed to Bystolic secondary to wheezing (which has improved).  -- Continue home meds  Code status- she would like to be switched to DNR status  Signed, NILI, HONDA PA-C  Pager [phone]  I have personally seen and examined patient and agree with note above by Angelena Form with minor changes.  Patient doing well post cath.  Renal  function stable.  Very short burst of SVT last night but this could have been movement.  She is asymptomatic on BB.  Cannot titrate BB further due to bradycardia.  Consider outpt monitor by primary Cardiologist.  Appears euvolemic on exam.  Will hold on Losartan due to soft BP and consider starting in outpt setting.  Stable for discharge home today with followup with Cardiologist in [place].   Signed: Fransico Him, MD [facility] 02/25/2014

## 2014-02-25 NOTE — Discharge Instructions (Signed)

## 2014-02-27 ENCOUNTER — Other Ambulatory Visit: Payer: Self-pay | Admitting: Family Medicine

## 2014-02-27 DIAGNOSIS — M48061 Spinal stenosis, lumbar region without neurogenic claudication: Secondary | ICD-10-CM

## 2014-03-02 ENCOUNTER — Ambulatory Visit
Admission: RE | Admit: 2014-03-02 | Discharge: 2014-03-02 | Disposition: A | Discharge status: Home / Self Care | Payer: Medicare Other | Source: Ambulatory Visit | Attending: Family Medicine | Admitting: Family Medicine

## 2014-03-02 DIAGNOSIS — H409 Unspecified glaucoma: Secondary | ICD-10-CM | POA: Insufficient documentation

## 2014-03-02 DIAGNOSIS — G8929 Other chronic pain: Secondary | ICD-10-CM | POA: Insufficient documentation

## 2014-03-02 DIAGNOSIS — I10 Essential (primary) hypertension: Secondary | ICD-10-CM | POA: Diagnosis not present

## 2014-03-02 DIAGNOSIS — R002 Palpitations: Secondary | ICD-10-CM | POA: Diagnosis not present

## 2014-03-02 DIAGNOSIS — E669 Obesity, unspecified: Secondary | ICD-10-CM | POA: Insufficient documentation

## 2014-03-02 DIAGNOSIS — J310 Chronic rhinitis: Secondary | ICD-10-CM | POA: Insufficient documentation

## 2014-03-02 DIAGNOSIS — E039 Hypothyroidism, unspecified: Secondary | ICD-10-CM | POA: Insufficient documentation

## 2014-03-02 DIAGNOSIS — M5416 Radiculopathy, lumbar region: Secondary | ICD-10-CM | POA: Diagnosis not present

## 2014-03-02 DIAGNOSIS — M48061 Spinal stenosis, lumbar region without neurogenic claudication: Secondary | ICD-10-CM

## 2014-03-02 DIAGNOSIS — J449 Chronic obstructive pulmonary disease, unspecified: Secondary | ICD-10-CM | POA: Diagnosis not present

## 2014-03-02 DIAGNOSIS — E119 Type 2 diabetes mellitus without complications: Secondary | ICD-10-CM | POA: Insufficient documentation

## 2014-03-02 DIAGNOSIS — G4733 Obstructive sleep apnea (adult) (pediatric): Secondary | ICD-10-CM | POA: Diagnosis not present

## 2014-03-02 DIAGNOSIS — F329 Major depressive disorder, single episode, unspecified: Secondary | ICD-10-CM | POA: Insufficient documentation

## 2014-03-02 DIAGNOSIS — G4730 Sleep apnea, unspecified: Secondary | ICD-10-CM | POA: Insufficient documentation

## 2014-03-02 DIAGNOSIS — K219 Gastro-esophageal reflux disease without esophagitis: Secondary | ICD-10-CM | POA: Insufficient documentation

## 2014-03-02 DIAGNOSIS — F32A Depression, unspecified: Secondary | ICD-10-CM | POA: Insufficient documentation

## 2014-03-02 MED ORDER — METHYLPREDNISOLONE ACETATE 40 MG/ML INJ SUSP (RADIOLOG
120.0000 mg | Freq: Once | INTRAMUSCULAR | Status: AC
  Administered 2014-03-02: 120 mg via EPIDURAL

## 2014-03-02 MED ORDER — IOHEXOL 180 MG/ML  SOLN
1.0000 mL | Freq: Once | INTRAMUSCULAR | Status: AC | PRN
Start: 2014-03-02 — End: 2014-03-02
  Administered 2014-03-02: 1 mL via EPIDURAL

## 2014-03-02 NOTE — Discharge Instructions (Signed)

## 2014-03-07 DIAGNOSIS — F321 Major depressive disorder, single episode, moderate: Secondary | ICD-10-CM | POA: Diagnosis not present

## 2014-03-07 DIAGNOSIS — J441 Chronic obstructive pulmonary disease with (acute) exacerbation: Secondary | ICD-10-CM | POA: Diagnosis not present

## 2014-03-07 DIAGNOSIS — E1140 Type 2 diabetes mellitus with diabetic neuropathy, unspecified: Secondary | ICD-10-CM | POA: Diagnosis not present

## 2014-03-20 DIAGNOSIS — M25571 Pain in right ankle and joints of right foot: Secondary | ICD-10-CM | POA: Diagnosis not present

## 2014-03-20 DIAGNOSIS — M71571 Other bursitis, not elsewhere classified, right ankle and foot: Secondary | ICD-10-CM | POA: Diagnosis not present

## 2014-03-20 DIAGNOSIS — M2041 Other hammer toe(s) (acquired), right foot: Secondary | ICD-10-CM | POA: Diagnosis not present

## 2014-04-07 DIAGNOSIS — E1142 Type 2 diabetes mellitus with diabetic polyneuropathy: Secondary | ICD-10-CM | POA: Diagnosis not present

## 2014-04-07 DIAGNOSIS — I10 Essential (primary) hypertension: Secondary | ICD-10-CM | POA: Diagnosis not present

## 2014-04-07 DIAGNOSIS — E559 Vitamin D deficiency, unspecified: Secondary | ICD-10-CM | POA: Diagnosis not present

## 2014-04-07 DIAGNOSIS — D649 Anemia, unspecified: Secondary | ICD-10-CM | POA: Diagnosis not present

## 2014-04-07 DIAGNOSIS — Z23 Encounter for immunization: Secondary | ICD-10-CM | POA: Diagnosis not present

## 2014-04-07 DIAGNOSIS — E1140 Type 2 diabetes mellitus with diabetic neuropathy, unspecified: Secondary | ICD-10-CM | POA: Diagnosis not present

## 2014-04-07 DIAGNOSIS — E785 Hyperlipidemia, unspecified: Secondary | ICD-10-CM | POA: Diagnosis not present

## 2014-04-07 DIAGNOSIS — E890 Postprocedural hypothyroidism: Secondary | ICD-10-CM | POA: Diagnosis not present

## 2014-04-07 DIAGNOSIS — R718 Other abnormality of red blood cells: Secondary | ICD-10-CM | POA: Diagnosis not present

## 2014-04-07 DIAGNOSIS — R252 Cramp and spasm: Secondary | ICD-10-CM | POA: Diagnosis not present

## 2014-04-07 DIAGNOSIS — I110 Hypertensive heart disease with heart failure: Secondary | ICD-10-CM | POA: Diagnosis not present

## 2014-04-07 DIAGNOSIS — F331 Major depressive disorder, recurrent, moderate: Secondary | ICD-10-CM | POA: Diagnosis not present

## 2014-04-07 DIAGNOSIS — Z794 Long term (current) use of insulin: Secondary | ICD-10-CM | POA: Diagnosis not present

## 2014-04-17 DIAGNOSIS — F411 Generalized anxiety disorder: Secondary | ICD-10-CM | POA: Diagnosis not present

## 2014-04-17 DIAGNOSIS — F4310 Post-traumatic stress disorder, unspecified: Secondary | ICD-10-CM | POA: Diagnosis not present

## 2014-04-17 DIAGNOSIS — F331 Major depressive disorder, recurrent, moderate: Secondary | ICD-10-CM | POA: Diagnosis not present

## 2014-04-29 NOTE — Op Note (Signed)
PATIENT NAME:  Bonnie Henry, Bonnie Henry MR#:  [id number] DATE OF BIRTH:  23-Mar-1940  DATE OF PROCEDURE:  07/19/2013  PREOPERATIVE DIAGNOSES:  1.  Embolic episode to right hand versus Raynaud changes.  2.  Atherosclerotic occlusive disease, right subclavian.   POSTOPERATIVE DIAGNOSES: 1.  Embolic episode to right hand versus Raynaud changes.  2.  Atherosclerotic occlusive disease, right subclavian.   PROCEDURES PERFORMED: 1.  Arch aortogram.  2.  Right upper extremity distal runoff, 3rd order catheter placement.   SURGEON: Katha Cabal, M.D.   SEDATION: Versed 4 mg plus fentanyl 150 mcg administered IV. Continuous ECG, pulse oximetry and cardiopulmonary monitoring was  performed throughout the entire procedure by the interventional radiology nurse. Total sedation time was 1 hour.   ACCESS: A 5 French sheath, right common femoral artery.   FLUOROSCOPY TIME: 19 minutes.   CONTRAST USED: Isovue 65 mL.   INDICATIONS: Ms. Deroo is a 74 year old woman who experienced an episode where her middle became acutely discolored and painful. The surrounding 2 fingers also were affected but not to the same extent. The noninvasive studies suggested a high-grade stricture within the subclavian and she is now undergoing angiography with the hope for possible intervention. Risks and benefits are reviewed. All questions answered. The patient agrees to proceed.   DESCRIPTION OF PROCEDURE: The patient is taken to the special procedure suite, placed in the supine position. After adequate sedation is achieved, she is positioned supine and her groins are prepped and draped in sterile fashion. Ultrasound is placed in a sterile sleeve. Ultrasound is utilized secondary to lack of appropriate landmarks and to avoid vascular injury. Under direct ultrasound visualization, the common femoral artery is identified. It is echolucent and pulsatile indicating patency. Images recorded for the permanent record, and access  is obtained to the common femoral artery just above visible plaque formation in a retrograde direction.  A Microwire followed by Microsheath, J-wire followed by a 5 French sheath.   The catheter/wire combination is then advanced into the ascending aorta and an LAO projection of the aortic arch is obtained. Type III aortic arch is noted, but no ostial lesions of the great vessels are identified which are hemodynamically significant stenosis. Diffuse calcifications are noted.   Using a combination of catheters including a Bentson-Hanafee-Wilson, a Simmons 1, and ultimately a JB 1, the innominate artery is engaged. With the [facility] in place magnified LAO, AP and RAO projections of the innominate artery and carotid subclavian bifurcation are obtained, RAO providing the best imaging. Subsequently with the JB 1 the catheter and wire are negotiated out to the brachial artery where distal runoff is obtained. After review of the images, the catheter is removed over a wire, RAO projection of the groin is obtained and a StarClose device deployed without difficulty. There are no immediate complications.   INTERPRETATION: The aortic arch is opacified with a bolus injection of contrast. It is a type III arch with very steep angulation. There are calcifications noted at the ostia of all 3 great vessels, but these are not hemodynamically significant lesions. In the RAO projection of the innominate vein it is clear that there is an ostial lesion of the subclavian, it appears to be less than 30%. It does not appear to be ulcerated and therefore I will not treat this lesion. Visualized portions of the common carotid are widely patent.   The left subclavian is otherwise widely patent without evidence of significant disease as are the axillary, brachial, radial, and ulnar. Interosseous  is a somewhat small, and does not contribute to the hand.   Magnified vision images of the hand demonstrate that the palmar arch is intact and  there appeared to be 2 digital vessels to all digits.  The thumb is somewhat curved and there is one clearly identified, the other is somewhat difficult to ascertain for certain. Nevertheless, there does not appear to have been any catastrophic embolic events as all visualized digital vessels appear completely intact and free of any distal pruning or occlusions.   SUMMARY: Mild atherosclerotic changes. No ulcerations or aneurysms identified. Distal vasculature at the digital level appears to be intact relatively speaking, suggesting this may have been an episode of Raynaud syndrome. Further cardiac work-up is also ongoing.    ____________________________ Katha Cabal, MD ggs:lt D: 07/20/2013 07:34:26 ET T: 07/20/2013 07:57:33 ET JOB#: [id number]  cc: Katha Cabal, MD, <Dictator> Enid Derry, MD Katha Cabal MD ELECTRONICALLY SIGNED 08/02/2013 12:22

## 2014-05-01 DIAGNOSIS — F331 Major depressive disorder, recurrent, moderate: Secondary | ICD-10-CM | POA: Diagnosis not present

## 2014-05-18 DIAGNOSIS — R0602 Shortness of breath: Secondary | ICD-10-CM | POA: Diagnosis not present

## 2014-05-18 DIAGNOSIS — J439 Emphysema, unspecified: Secondary | ICD-10-CM | POA: Diagnosis not present

## 2014-05-20 ENCOUNTER — Emergency Department (HOSPITAL_COMMUNITY): Payer: Medicare Other

## 2014-05-20 ENCOUNTER — Observation Stay (HOSPITAL_COMMUNITY)
Admission: EM | Admit: 2014-05-20 | Discharge: 2014-05-21 | Disposition: A | Discharge status: Home / Self Care | Payer: Medicare Other | Attending: Internal Medicine | Admitting: Internal Medicine

## 2014-05-20 ENCOUNTER — Encounter (HOSPITAL_COMMUNITY): Payer: Self-pay | Admitting: Emergency Medicine

## 2014-05-20 DIAGNOSIS — E780 Pure hypercholesterolemia: Secondary | ICD-10-CM | POA: Diagnosis not present

## 2014-05-20 DIAGNOSIS — Z9861 Coronary angioplasty status: Secondary | ICD-10-CM | POA: Diagnosis not present

## 2014-05-20 DIAGNOSIS — Z79899 Other long term (current) drug therapy: Secondary | ICD-10-CM | POA: Diagnosis not present

## 2014-05-20 DIAGNOSIS — R05 Cough: Secondary | ICD-10-CM | POA: Diagnosis not present

## 2014-05-20 DIAGNOSIS — G8929 Other chronic pain: Secondary | ICD-10-CM | POA: Insufficient documentation

## 2014-05-20 DIAGNOSIS — F419 Anxiety disorder, unspecified: Secondary | ICD-10-CM | POA: Insufficient documentation

## 2014-05-20 DIAGNOSIS — E890 Postprocedural hypothyroidism: Secondary | ICD-10-CM | POA: Diagnosis not present

## 2014-05-20 DIAGNOSIS — Z853 Personal history of malignant neoplasm of breast: Secondary | ICD-10-CM | POA: Insufficient documentation

## 2014-05-20 DIAGNOSIS — I252 Old myocardial infarction: Secondary | ICD-10-CM | POA: Diagnosis not present

## 2014-05-20 DIAGNOSIS — E119 Type 2 diabetes mellitus without complications: Secondary | ICD-10-CM | POA: Diagnosis not present

## 2014-05-20 DIAGNOSIS — F322 Major depressive disorder, single episode, severe without psychotic features: Secondary | ICD-10-CM | POA: Diagnosis not present

## 2014-05-20 DIAGNOSIS — R079 Chest pain, unspecified: Secondary | ICD-10-CM | POA: Diagnosis not present

## 2014-05-20 DIAGNOSIS — I10 Essential (primary) hypertension: Secondary | ICD-10-CM | POA: Insufficient documentation

## 2014-05-20 DIAGNOSIS — I2510 Atherosclerotic heart disease of native coronary artery without angina pectoris: Secondary | ICD-10-CM | POA: Diagnosis not present

## 2014-05-20 DIAGNOSIS — G4730 Sleep apnea, unspecified: Secondary | ICD-10-CM | POA: Insufficient documentation

## 2014-05-20 DIAGNOSIS — J449 Chronic obstructive pulmonary disease, unspecified: Secondary | ICD-10-CM | POA: Diagnosis not present

## 2014-05-20 DIAGNOSIS — R059 Cough, unspecified: Secondary | ICD-10-CM | POA: Insufficient documentation

## 2014-05-20 DIAGNOSIS — K219 Gastro-esophageal reflux disease without esophagitis: Secondary | ICD-10-CM | POA: Insufficient documentation

## 2014-05-20 DIAGNOSIS — D649 Anemia, unspecified: Secondary | ICD-10-CM | POA: Diagnosis not present

## 2014-05-20 DIAGNOSIS — M1990 Unspecified osteoarthritis, unspecified site: Secondary | ICD-10-CM | POA: Insufficient documentation

## 2014-05-20 DIAGNOSIS — G629 Polyneuropathy, unspecified: Secondary | ICD-10-CM | POA: Diagnosis not present

## 2014-05-20 DIAGNOSIS — Z794 Long term (current) use of insulin: Secondary | ICD-10-CM | POA: Insufficient documentation

## 2014-05-20 DIAGNOSIS — Z7982 Long term (current) use of aspirin: Secondary | ICD-10-CM | POA: Diagnosis not present

## 2014-05-20 DIAGNOSIS — E1121 Type 2 diabetes mellitus with diabetic nephropathy: Secondary | ICD-10-CM | POA: Diagnosis present

## 2014-05-20 DIAGNOSIS — R0789 Other chest pain: Secondary | ICD-10-CM | POA: Diagnosis not present

## 2014-05-20 DIAGNOSIS — Z87891 Personal history of nicotine dependence: Secondary | ICD-10-CM | POA: Diagnosis not present

## 2014-05-20 HISTORY — DX: Reserved for inherently not codable concepts without codable children: IMO0001

## 2014-05-20 HISTORY — DX: Cardiac murmur, unspecified: R01.1

## 2014-05-20 LAB — BASIC METABOLIC PANEL
Anion gap: 13 (ref 5–15)
BUN: 12 mg/dL (ref 6–20)
CO2: 24 mmol/L (ref 22–32)
Calcium: 8.7 mg/dL — ABNORMAL LOW (ref 8.9–10.3)
Chloride: 101 mmol/L (ref 101–111)
Creatinine, Ser: 1.09 mg/dL — ABNORMAL HIGH (ref 0.44–1.00)
GFR calc Af Amer: 57 mL/min — ABNORMAL LOW (ref >60)
GFR calc non Af Amer: 49 mL/min — ABNORMAL LOW (ref >60)
Glucose, Bld: 340 mg/dL — ABNORMAL HIGH (ref 65–99)
Potassium: 3.5 mmol/L (ref 3.5–5.1)
Sodium: 138 mmol/L (ref 135–145)

## 2014-05-20 LAB — CBC
HEMATOCRIT: 32.4 % — AB (ref 36.0–46.0)
HEMOGLOBIN: 10.1 g/dL — AB (ref 12.0–15.0)
MCH: 25.4 pg — ABNORMAL LOW (ref 26.0–34.0)
MCHC: 31.2 g/dL (ref 30.0–36.0)
MCV: 81.6 fL (ref 78.0–100.0)
Platelets: 246 10*3/uL (ref 150–400)
RBC: 3.97 MIL/uL (ref 3.87–5.11)
RDW: 14.7 % (ref 11.5–15.5)
WBC: 11.3 10*3/uL — AB (ref 4.0–10.5)

## 2014-05-20 LAB — CBG MONITORING, ED: Glucose-Capillary: 337 mg/dL — ABNORMAL HIGH (ref 65–99)

## 2014-05-20 LAB — I-STAT TROPONIN, ED: TROPONIN I, POC: 0 ng/mL (ref 0.00–0.08)

## 2014-05-20 LAB — BRAIN NATRIURETIC PEPTIDE: B Natriuretic Peptide: 212.8 pg/mL — ABNORMAL HIGH (ref 0.0–100.0)

## 2014-05-20 MED ORDER — INSULIN ASPART 100 UNIT/ML ~~LOC~~ SOLN
10.0000 [IU] | Freq: Once | SUBCUTANEOUS | Status: AC
  Administered 2014-05-20: 10 [IU] via SUBCUTANEOUS
  Filled 2014-05-20: qty 1

## 2014-05-20 NOTE — ED Notes (Signed)
Pt arrives from home via [facility], c/o CP 8/10, central chest, non radiating, pt endorses SOB, denies diaphoresis, N/V, dizziness, lightheadedness.  Pt reports taking 324 ASA and 2 NTG before EMS arrival, reports pain resolved 0/10.  Pt reports cough x 3 weeks, fluid on legs x 1 week unimproved with "fluid pills". Pt reports CBG 476 at 1930, pt reports taking 25 units of novolog at that time.  EMS reports CBG 447.  NAD noted at this time, pt denies CP at this time.  Cough noted, worse with movement.

## 2014-05-20 NOTE — ED Provider Notes (Signed)
CSN: [id number]     Arrival date & time 05/20/14  2131 History   First MD Initiated Contact with Patient 05/20/14 2140     Chief Complaint  Patient presents with  . Chest Pain  . Cough  . Hyperglycemia     (Consider location/radiation/quality/duration/timing/severity/associated sxs/prior Treatment) HPI  Daniqua Campoy is a 74 y.o. female with a history of DM with neuropathy, HTN, COPD, PVD, HLD and CAD s/p prior PCI[place]stenting x3 in [place] (~ 10 years ago)  Presenting with c/o chest pain.  Pt states pain began earlier today- pain located in central chest, no radiation.  It is associated with shortness of breath, no nausea, no diaphoresis.  Pt states she took ASA and nitroglycerin prior to EMS arrival- pain decreased to 0/10.  She has had cough x 3 weeks- was started on prednisone 2 days ago for COPD exacerbation.  No fever.  Chest pain she is describing is different from usual chest pain with coughing.  Has had some bilateral lower extremity swelling as well.  There are no other associated systemic symptoms, there are no other alleviating or modifying factors.   Past Medical History  Diagnosis Date  . Hypertension   . GERD (gastroesophageal reflux disease)   . CAD (coronary artery disease)   . Hypercholesteremia   . COPD (chronic obstructive pulmonary disease)   . Morbid obesity   . Post-surgical hypothyroidism   . Depression, major   . Neuropathy   . Asthma   . Sleep apnea     "2015 wore mask; lost weight; told didn't need mask anymore" (02/23/2014)  . Type II diabetes mellitus   . Anxiety   . Anemia   . History of blood transfusion     "when I had my mastectomy"  . Arthritis     "joints ache all over"   . Chronic back pain   . Breast cancer, left breast     S/P mastectomy  . Myocardial infarction   . Shortness of breath dyspnea   . Heart murmur    Past Surgical History  Procedure Laterality Date  . Bartholin cyst marsupialization    . Trigger finger release Left   .  Cataract extraction w/ intraocular lens  implant, bilateral Bilateral ~ 2011  . Total knee arthroplasty Left 1990's  . Total hip arthroplasty Bilateral 1990's  . Thyroidectomy, partial  1950's  . Shoulder surgery Right     "cleaned it out; no scope"  . Toe surgery Right     "cut piece of bone out so toe didn't dig into my foot"  . Carotid endarterectomy Left 2009  . Hammer toe surgery Left     2nd and 3rd digits  . Tonsillectomy  1950's  . Joint replacement    . Breast biopsy Left   . Mastectomy Left 1990's  . Tubal ligation    . Vaginal hysterectomy      "partial"  . Coronary angioplasty with stent placement  1990's X 2    "2; 1"  . Cardiac catheterization  2000's  . Left heart catheterization with coronary angiogram N/A 02/24/2014    Procedure: LEFT HEART CATHETERIZATION WITH CORONARY ANGIOGRAM;  Surgeon: Leonie Man, MD;  Location: [facility] CATH LAB;  Service: Cardiovascular;  Laterality: N/A;   Family History  Problem Relation Age of Onset  . Arthritis Mother   . Cancer Mother   . Diabetes Mother   . Heart disease Mother   . Hyperlipidemia Mother   . Hypertension Mother   .  Alcohol abuse Father   . Alcohol abuse Brother   . Arthritis Brother   . Asthma Brother   . HIV Brother   . Cancer Brother   . Diabetes Brother   . Hyperlipidemia Brother   . Hypertension Brother   . Lung disease Brother   . Arthritis Maternal Grandmother   . Brain cancer Father    History  Substance Use Topics  . Smoking status: Former Smoker -- 1.50 packs/day for 44 years    Types: Cigarettes    Quit date: 01/06/1994  . Smokeless tobacco: Never Used     Comment: "quit smoking cigarettes in ~ 2000"  . Alcohol Use: No     Comment: 02/23/2014 "no alcohol since 1990's"   OB History    No data available     Review of Systems  ROS reviewed and all otherwise negative except for mentioned in HPI    Allergies  Enalapril; Codeine; and Morphine and related  Home Medications   Prior to  Admission medications   Medication Sig Start Date End Date Taking? Authorizing Provider  acetaminophen (TYLENOL) 500 MG tablet Take 1,000 mg by mouth every 6 (six) hours as needed (pain).   Yes Historical Provider, MD  albuterol (PROAIR HFA) 108 (90 BASE) MCG/ACT inhaler Inhale 2 puffs into the lungs every 4 (four) hours as needed for wheezing or shortness of breath.    Yes Historical Provider, MD  albuterol (PROVENTIL) (2.5 MG/3ML) 0.083% nebulizer solution Take 2.5 mg by nebulization every 4 (four) hours as needed for wheezing or shortness of breath.   Yes Historical Provider, MD  amLODipine (NORVASC) 5 MG tablet Take 10 mg by mouth daily.  05/14/14  Yes Historical Provider, MD  aspirin EC 81 MG tablet Take 81 mg by mouth daily.   Yes Historical Provider, MD  atorvastatin (LIPITOR) 40 MG tablet Take 40 mg by mouth at bedtime.   Yes Historical Provider, MD  budesonide-formoterol (SYMBICORT) 160-4.5 MCG/ACT inhaler Inhale 2 puffs into the lungs 2 (two) times daily. RINSE MOUTH AFTER EACH USE   Yes Historical Provider, MD  bumetanide (BUMEX) 1 MG tablet Take 1 mg by mouth daily.  04/21/14  Yes Historical Provider, MD  busPIRone (BUSPAR) 15 MG tablet Take 15 mg by mouth 2 (two) times daily.  05/09/14  Yes Historical Provider, MD  carvedilol (COREG) 12.5 MG tablet Take 12.5 mg by mouth 2 (two) times daily with a meal.  01/24/14  Yes Historical Provider, MD  cyanocobalamin 500 MCG tablet Take 500 mcg by mouth daily. Vitamin B12   Yes Historical Provider, MD  escitalopram (LEXAPRO) 10 MG tablet Take 10 mg by mouth at bedtime.    Yes Historical Provider, MD  fluticasone (FLONASE) 50 MCG/ACT nasal spray Place 1 spray into both nostrils 2 (two) times daily.    Yes Historical Provider, MD  Hypromellose (ARTIFICIAL TEARS OP) Place 1 drop into both eyes 2 (two) times daily as needed (dry eyes).   Yes Historical Provider, MD  insulin aspart (NOVOLOG) 100 UNIT/ML injection Inject 15 Units into the skin 3 (three) times  daily after meals.    Yes Historical Provider, MD  insulin glargine (LANTUS) 100 UNIT/ML injection Inject 37 Units into the skin daily.    Yes Historical Provider, MD  levothyroxine (SYNTHROID, LEVOTHROID) 112 MCG tablet Take 112 mcg by mouth daily before breakfast.  05/09/14  Yes Historical Provider, MD  montelukast (SINGULAIR) 10 MG tablet Take 10 mg by mouth at bedtime.   Yes Historical Provider,  MD  nitroGLYCERIN (NITROSTAT) 0.4 MG SL tablet Place 0.4 mg under the tongue every 5 (five) minutes as needed for chest pain.   Yes Historical Provider, MD  Omega-3 Fatty Acids (FISH OIL) 1000 MG CAPS Take 1,000 mg by mouth at bedtime.    Yes Historical Provider, MD  omeprazole (PRILOSEC) 20 MG capsule Take 20 mg by mouth 2 (two) times daily before a meal.    Yes Historical Provider, MD  Potassium Gluconate 595 MG CAPS Take 595 mg by mouth at bedtime.    Yes Historical Provider, MD  predniSONE (DELTASONE) 10 MG tablet 10-40 mg daily with breakfast. Tapered course started 05/19/14:  Take 4 tablets (40 mg) by mouth for 2 days, then take 3 tablets (30 mg) for 2 days, then take 2 tablets (20 mg) daily for 2 days, then take 1 tablet (10 mg) daily for 2 days, then stop 05/18/14  Yes Historical Provider, MD  QUEtiapine (SEROQUEL) 25 MG tablet Take 25 mg by mouth at bedtime.  05/01/14  Yes Historical Provider, MD  rOPINIRole (REQUIP) 1 MG tablet Take 1 mg by mouth 3 (three) times daily.   Yes Historical Provider, MD  senna (SENOKOT) 8.6 MG TABS tablet Take 2 tablets by mouth at bedtime.    Yes Historical Provider, MD  spironolactone (ALDACTONE) 25 MG tablet Take 25 mg by mouth daily.   Yes Historical Provider, MD  tiotropium (SPIRIVA HANDIHALER) 18 MCG inhalation capsule Place 18 mcg into inhaler and inhale daily.   Yes Historical Provider, MD  Vitamin E 400 UNITS TABS Take 400 Units by mouth at bedtime.    Yes Historical Provider, MD  azithromycin (ZITHROMAX) 250 MG tablet 2 tab po today, then 1 tab po daily until  gone 05/21/14   Delfina Redwood, MD  meloxicam (MOBIC) 15 MG tablet Take 1 tablet (15 mg total) by mouth daily as needed for pain. 05/21/14   Delfina Redwood, MD   BP 155/42 mmHg  Pulse 58  Temp(Src) 98 F (36.7 C) (Oral)  Resp 18  Ht 5' (1.524 m)  Wt 201 lb 11.2 oz (91.49 kg)  BMI 39.39 kg/m2  SpO2 97%  Vitals reviewed Physical Exam  Physical Examination: General appearance - alert, well appearing, and in no distress Mental status - alert, oriented to person, place, and time Eyes - no conjunctival injection no scleral icterus Mouth - mucous membranes moist, pharynx normal without lesions Chest - clear to auscultation, no wheezes, rales or rhonchi, symmetric air entry Heart - normal rate, regular rhythm, normal S1, S2, no murmurs, rubs, clicks or gallops Abdomen - soft, nontender, nondistended, no masses or organomegaly Extremities - peripheral pulses normal, no pedal edema, no clubbing or cyanosis Skin - normal coloration and turgor, no rashes  ED Course  Procedures (including critical care time)  11:41 PM d/w Dr. Maudie Mercury, triad for admission.  He requests that I contact cardiology, will page now.  Pt to be admitted to telemetry obs bed.    12:07 AM d/w Dr. Radford Pax, cardiology- she states if patient should be seen in the morning, the cardiology team will need to be re-paged tomorrow morning.   Labs Review Labs Reviewed  CBC - Abnormal; Notable for the following:    WBC 11.3 (*)    Hemoglobin 10.1 (*)    HCT 32.4 (*)    MCH 25.4 (*)    All other components within normal limits  BASIC METABOLIC PANEL - Abnormal; Notable for the following:    Glucose,  Bld 340 (*)    Creatinine, Ser 1.09 (*)    Calcium 8.7 (*)    GFR calc non Af Amer 49 (*)    GFR calc Af Amer 57 (*)    All other components within normal limits  BRAIN NATRIURETIC PEPTIDE - Abnormal; Notable for the following:    B Natriuretic Peptide 212.8 (*)    All other components within normal limits  TSH - Abnormal;  Notable for the following:    TSH 0.171 (*)    All other components within normal limits  CBC - Abnormal; Notable for the following:    WBC 11.1 (*)    Hemoglobin 10.2 (*)    HCT 32.4 (*)    All other components within normal limits  COMPREHENSIVE METABOLIC PANEL - Abnormal; Notable for the following:    Potassium 3.4 (*)    All other components within normal limits  GLUCOSE, CAPILLARY - Abnormal; Notable for the following:    Glucose-Capillary 109 (*)    All other components within normal limits  GLUCOSE, CAPILLARY - Abnormal; Notable for the following:    Glucose-Capillary 101 (*)    All other components within normal limits  GLUCOSE, CAPILLARY - Abnormal; Notable for the following:    Glucose-Capillary 237 (*)    All other components within normal limits  GLUCOSE, CAPILLARY - Abnormal; Notable for the following:    Glucose-Capillary 219 (*)    All other components within normal limits  CBG MONITORING, ED - Abnormal; Notable for the following:    Glucose-Capillary 337 (*)    All other components within normal limits  TROPONIN I  TROPONIN I  TROPONIN I  LIPID PANEL  HEMOGLOBIN A1C  I-STAT TROPOININ, ED    Imaging Review Ct Angio Chest Pe W/cm &/or Wo Cm  05/21/2014   CLINICAL DATA:  Chest pain, shortness of breath.  EXAM: CT ANGIOGRAPHY CHEST WITH CONTRAST  TECHNIQUE: Multidetector CT imaging of the chest was performed using the standard protocol during bolus administration of intravenous contrast. Multiplanar CT image reconstructions and MIPs were obtained to evaluate the vascular anatomy.  CONTRAST:  67mL OMNIPAQUE IOHEXOL 350 MG/ML SOLN  COMPARISON:  Chest radiograph of May 20, 2014.  FINDINGS: No pneumothorax or pleural effusion is noted. 3.5 mm nodule is noted laterally in right middle lobe. Another 3 mm nodule is noted more anteriorly in the right middle lobe. There is no evidence of pulmonary embolus. Atherosclerosis of thoracic aorta is noted without definite aneurysm  formation. No definite evidence of thoracic aortic dissection is noted. Visualized portion of upper abdomen appears normal. No significant osseous abnormality is noted. Coronary artery calcifications are noted suggesting coronary artery disease. No mediastinal mass or adenopathy is noted.  Review of the MIP images confirms the above findings.  IMPRESSION: No evidence of pulmonary embolus.  Coronary artery calcifications are noted suggesting coronary artery disease.  Two nodules are noted in the right middle lobe, with the largest measuring 4 mm. If the patient is at high risk for bronchogenic carcinoma, follow-up chest CT at 1 year is recommended. If the patient is at low risk, no follow-up is needed. This recommendation follows the consensus statement: Guidelines for Management of Small Pulmonary Nodules Detected on CT Scans: A Statement from the [employer] as published in Radiology 2005; 237:395-400.   Electronically Signed   By: Marijo Conception, M.D.   On: 05/21/2014 10:27   Dg Chest Port 1 View  05/20/2014   CLINICAL DATA:  74 year old female  with chest pain and cough.  EXAM: PORTABLE CHEST - 1 VIEW  COMPARISON:  02/22/2014  FINDINGS: The cardiomediastinal silhouette is unremarkable.  There is no evidence of focal airspace disease, pulmonary edema, suspicious pulmonary nodule/mass, pleural effusion, or pneumothorax. No acute bony abnormalities are identified.  IMPRESSION: No active disease.   Electronically Signed   By: Margarette Canada M.D.   On: 05/20/2014 22:59     EKG Interpretation   Date/Time:  Saturday May 20 2014 21:47:49 EDT Ventricular Rate:  65 PR Interval:  188 QRS Duration: 94 QT Interval:  447 QTC Calculation: 465 R Axis:   -31 Text Interpretation:  Sinus rhythm Ventricular premature complex Left axis  deviation No significant change since last tracing Confirmed by [facility]   MD, Shivank Pinedo [phone]) on 05/20/2014 10:52:38 PM      MDM   Final diagnoses:  Chest pain  Cough     Pt with hx of CAD, DM, COPD presents with chest pain.  She has hx of multiple cardiac stents.  Troponin and EKG are reassuring.  Blood sugar is elevated, likely due to recent prednisone started for COPD exacerbation.  Pt has no wheezing or dyspnea in the ED.  D/w triad for admission.  Called placed to cardiology at triad request, they state they will need to be reconsulted in the AM.  Pt admitted to telemetry service.      Alfonzo Beers, MD 05/21/14 [phone]

## 2014-05-21 ENCOUNTER — Encounter (HOSPITAL_COMMUNITY): Payer: Self-pay | Admitting: *Deleted

## 2014-05-21 ENCOUNTER — Observation Stay (HOSPITAL_COMMUNITY): Payer: Medicare Other

## 2014-05-21 DIAGNOSIS — R079 Chest pain, unspecified: Secondary | ICD-10-CM | POA: Diagnosis not present

## 2014-05-21 DIAGNOSIS — R0789 Other chest pain: Secondary | ICD-10-CM | POA: Diagnosis not present

## 2014-05-21 DIAGNOSIS — R05 Cough: Secondary | ICD-10-CM

## 2014-05-21 DIAGNOSIS — E1121 Type 2 diabetes mellitus with diabetic nephropathy: Secondary | ICD-10-CM

## 2014-05-21 DIAGNOSIS — R918 Other nonspecific abnormal finding of lung field: Secondary | ICD-10-CM | POA: Diagnosis not present

## 2014-05-21 DIAGNOSIS — D649 Anemia, unspecified: Secondary | ICD-10-CM

## 2014-05-21 DIAGNOSIS — R059 Cough, unspecified: Secondary | ICD-10-CM | POA: Insufficient documentation

## 2014-05-21 LAB — COMPREHENSIVE METABOLIC PANEL WITH GFR
ALT: 18 U/L (ref 14–54)
AST: 23 U/L (ref 15–41)
Albumin: 3.5 g/dL (ref 3.5–5.0)
Alkaline Phosphatase: 86 U/L (ref 38–126)
Anion gap: 10 (ref 5–15)
BUN: 11 mg/dL (ref 6–20)
CO2: 27 mmol/L (ref 22–32)
Calcium: 9 mg/dL (ref 8.9–10.3)
Chloride: 106 mmol/L (ref 101–111)
Creatinine, Ser: 0.89 mg/dL (ref 0.44–1.00)
GFR calc Af Amer: >60 mL/min
GFR calc non Af Amer: >60 mL/min
Glucose, Bld: 92 mg/dL (ref 65–99)
Potassium: 3.4 mmol/L — ABNORMAL LOW (ref 3.5–5.1)
Sodium: 143 mmol/L (ref 135–145)
Total Bilirubin: 0.9 mg/dL (ref 0.3–1.2)
Total Protein: 7.1 g/dL (ref 6.5–8.1)

## 2014-05-21 LAB — CBC
HCT: 32.4 % — ABNORMAL LOW (ref 36.0–46.0)
HEMOGLOBIN: 10.2 g/dL — AB (ref 12.0–15.0)
MCH: 26 pg (ref 26.0–34.0)
MCHC: 31.5 g/dL (ref 30.0–36.0)
MCV: 82.4 fL (ref 78.0–100.0)
Platelets: 214 10*3/uL (ref 150–400)
RBC: 3.93 MIL/uL (ref 3.87–5.11)
RDW: 14.9 % (ref 11.5–15.5)
WBC: 11.1 10*3/uL — ABNORMAL HIGH (ref 4.0–10.5)

## 2014-05-21 LAB — LIPID PANEL
CHOL/HDL RATIO: 2.4 ratio
Cholesterol: 166 mg/dL (ref 0–200)
HDL: 70 mg/dL (ref >40)
LDL Cholesterol: 71 mg/dL (ref 0–99)
Triglycerides: 123 mg/dL (ref <150)
VLDL: 25 mg/dL (ref 0–40)

## 2014-05-21 LAB — GLUCOSE, CAPILLARY
GLUCOSE-CAPILLARY: 219 mg/dL — AB (ref 65–99)
Glucose-Capillary: 109 mg/dL — ABNORMAL HIGH (ref 65–99)
Glucose-Capillary: 101 mg/dL — ABNORMAL HIGH (ref 65–99)
Glucose-Capillary: 237 mg/dL — ABNORMAL HIGH (ref 65–99)

## 2014-05-21 LAB — TROPONIN I
Troponin I: 0.03 ng/mL
Troponin I: <0.03 ng/mL

## 2014-05-21 LAB — TSH: TSH: 0.171 u[IU]/mL — AB (ref 0.350–4.500)

## 2014-05-21 MED ORDER — INSULIN GLARGINE 100 UNIT/ML ~~LOC~~ SOLN
10.0000 [IU] | Freq: Every day | SUBCUTANEOUS | Status: DC
  Administered 2014-05-21: 10 [IU] via SUBCUTANEOUS
  Filled 2014-05-21: qty 0.1

## 2014-05-21 MED ORDER — POTASSIUM GLUCONATE 595 MG PO CAPS: 595.0000 mg | ORAL_CAPSULE | Freq: Every day | ORAL | Status: DC

## 2014-05-21 MED ORDER — ATORVASTATIN CALCIUM 40 MG PO TABS: 40.0000 mg | ORAL_TABLET | Freq: Every day | ORAL | Status: DC

## 2014-05-21 MED ORDER — PANTOPRAZOLE SODIUM 40 MG PO TBEC
40.0000 mg | DELAYED_RELEASE_TABLET | Freq: Every day | ORAL | Status: DC
  Administered 2014-05-21: 40 mg via ORAL
  Filled 2014-05-21: qty 1

## 2014-05-21 MED ORDER — MONTELUKAST SODIUM 10 MG PO TABS: 10.0000 mg | ORAL_TABLET | Freq: Every day | ORAL | Status: DC

## 2014-05-21 MED ORDER — ESCITALOPRAM OXALATE 10 MG PO TABS: 10.0000 mg | ORAL_TABLET | Freq: Every day | ORAL | Status: DC

## 2014-05-21 MED ORDER — FLUTICASONE PROPIONATE 50 MCG/ACT NA SUSP
1.0000 | Freq: Two times a day (BID) | NASAL | Status: DC
Start: 2014-05-21 — End: 2014-05-21
  Administered 2014-05-21: 1 via NASAL
  Filled 2014-05-21: qty 16

## 2014-05-21 MED ORDER — BUMETANIDE 1 MG PO TABS
1.0000 mg | ORAL_TABLET | Freq: Every day | ORAL | Status: DC
  Administered 2014-05-21: 1 mg via ORAL
  Filled 2014-05-21: qty 1

## 2014-05-21 MED ORDER — PREDNISONE 10 MG PO TABS: 10.0000 mg | ORAL_TABLET | Freq: Every day | ORAL | Status: DC

## 2014-05-21 MED ORDER — ROPINIROLE HCL 1 MG PO TABS
1.0000 mg | ORAL_TABLET | Freq: Three times a day (TID) | ORAL | Status: DC
  Administered 2014-05-21 (×2): 1 mg via ORAL
  Filled 2014-05-21 (×2): qty 1

## 2014-05-21 MED ORDER — ALBUTEROL SULFATE HFA 108 (90 BASE) MCG/ACT IN AERS: 2.0000 | INHALATION_SPRAY | RESPIRATORY_TRACT | Status: DC | PRN

## 2014-05-21 MED ORDER — OMEGA-3-ACID ETHYL ESTERS 1 G PO CAPS
1.0000 g | ORAL_CAPSULE | Freq: Every day | ORAL | Status: DC
  Administered 2014-05-21: 1 g via ORAL
  Filled 2014-05-21: qty 1

## 2014-05-21 MED ORDER — SENNA 8.6 MG PO TABS
2.0000 | ORAL_TABLET | Freq: Every day | ORAL | Status: DC
  Administered 2014-05-21: 17.2 mg via ORAL
  Filled 2014-05-21: qty 2

## 2014-05-21 MED ORDER — SODIUM CHLORIDE 0.9 % IJ SOLN
3.0000 mL | Freq: Two times a day (BID) | INTRAMUSCULAR | Status: DC
  Administered 2014-05-21 (×2): 3 mL via INTRAVENOUS

## 2014-05-21 MED ORDER — QUETIAPINE FUMARATE 25 MG PO TABS: 25.0000 mg | ORAL_TABLET | Freq: Every day | ORAL | Status: DC

## 2014-05-21 MED ORDER — INSULIN ASPART 100 UNIT/ML ~~LOC~~ SOLN
0.0000 [IU] | Freq: Three times a day (TID) | SUBCUTANEOUS | Status: DC
Start: 2014-05-21 — End: 2014-05-21
  Administered 2014-05-21 (×2): 3 [IU] via SUBCUTANEOUS

## 2014-05-21 MED ORDER — ENOXAPARIN SODIUM 40 MG/0.4ML ~~LOC~~ SOLN
40.0000 mg | SUBCUTANEOUS | Status: DC
  Administered 2014-05-21: 40 mg via SUBCUTANEOUS
  Filled 2014-05-21: qty 0.4

## 2014-05-21 MED ORDER — LEVOTHYROXINE SODIUM 112 MCG PO TABS
112.0000 ug | ORAL_TABLET | Freq: Every day | ORAL | Status: DC
  Administered 2014-05-21: 112 ug via ORAL
  Filled 2014-05-21: qty 1

## 2014-05-21 MED ORDER — AZITHROMYCIN 250 MG PO TABS: ORAL_TABLET | ORAL | Status: DC

## 2014-05-21 MED ORDER — ALBUTEROL SULFATE (2.5 MG/3ML) 0.083% IN NEBU: 2.5000 mg | INHALATION_SOLUTION | RESPIRATORY_TRACT | Status: DC | PRN

## 2014-05-21 MED ORDER — IOHEXOL 350 MG/ML SOLN
100.0000 mL | Freq: Once | INTRAVENOUS | Status: AC | PRN
  Administered 2014-05-21: 75 mL via INTRAVENOUS

## 2014-05-21 MED ORDER — ASPIRIN EC 81 MG PO TBEC
81.0000 mg | DELAYED_RELEASE_TABLET | Freq: Every day | ORAL | Status: DC
  Administered 2014-05-21: 81 mg via ORAL
  Filled 2014-05-21: qty 1

## 2014-05-21 MED ORDER — TIOTROPIUM BROMIDE MONOHYDRATE 18 MCG IN CAPS
18.0000 ug | ORAL_CAPSULE | Freq: Every day | RESPIRATORY_TRACT | Status: DC
  Administered 2014-05-21: 18 ug via RESPIRATORY_TRACT
  Filled 2014-05-21: qty 5

## 2014-05-21 MED ORDER — BUSPIRONE HCL 5 MG PO TABS
15.0000 mg | ORAL_TABLET | Freq: Two times a day (BID) | ORAL | Status: DC
  Administered 2014-05-21: 15 mg via ORAL
  Filled 2014-05-21 (×2): qty 1

## 2014-05-21 MED ORDER — POTASSIUM CHLORIDE CRYS ER 10 MEQ PO TBCR: 10.0000 meq | EXTENDED_RELEASE_TABLET | Freq: Every day | ORAL | Status: DC

## 2014-05-21 MED ORDER — SPIRONOLACTONE 25 MG PO TABS
25.0000 mg | ORAL_TABLET | Freq: Every day | ORAL | Status: DC
  Administered 2014-05-21: 25 mg via ORAL
  Filled 2014-05-21: qty 1

## 2014-05-21 MED ORDER — AMLODIPINE BESYLATE 10 MG PO TABS
10.0000 mg | ORAL_TABLET | Freq: Every day | ORAL | Status: DC
  Administered 2014-05-21: 10 mg via ORAL
  Filled 2014-05-21: qty 1

## 2014-05-21 MED ORDER — BUDESONIDE-FORMOTEROL FUMARATE 160-4.5 MCG/ACT IN AERO
2.0000 | INHALATION_SPRAY | Freq: Two times a day (BID) | RESPIRATORY_TRACT | Status: DC
  Administered 2014-05-21: 2 via RESPIRATORY_TRACT
  Filled 2014-05-21: qty 6

## 2014-05-21 MED ORDER — PREDNISONE 20 MG PO TABS
30.0000 mg | ORAL_TABLET | Freq: Every day | ORAL | Status: DC
  Administered 2014-05-21: 30 mg via ORAL
  Filled 2014-05-21 (×2): qty 1

## 2014-05-21 MED ORDER — PREDNISONE 20 MG PO TABS
20.0000 mg | ORAL_TABLET | Freq: Every day | ORAL | Status: DC
Start: 2014-05-23 — End: 2014-05-21

## 2014-05-21 MED ORDER — CARVEDILOL 12.5 MG PO TABS
12.5000 mg | ORAL_TABLET | Freq: Two times a day (BID) | ORAL | Status: DC
Start: 2014-05-21 — End: 2014-05-21
  Administered 2014-05-21 (×2): 12.5 mg via ORAL
  Filled 2014-05-21 (×2): qty 1

## 2014-05-21 MED ORDER — MELOXICAM 15 MG PO TABS: 15.0000 mg | ORAL_TABLET | Freq: Every day | ORAL | Status: DC | PRN

## 2014-05-21 MED ORDER — SODIUM CHLORIDE 0.9 % IV SOLN
INTRAVENOUS | Status: DC
  Administered 2014-05-21: 03:00:00 via INTRAVENOUS

## 2014-05-21 MED ORDER — NITROGLYCERIN 0.4 MG SL SUBL: 0.4000 mg | SUBLINGUAL_TABLET | SUBLINGUAL | Status: DC | PRN

## 2014-05-21 NOTE — Discharge Summary (Signed)
Physician Discharge Summary  Bonnie Henry EHM:[id number] DOB: 11-18-1940 DOA: 05/20/2014  PCP: Arnetha Courser, MD  Admit date: 05/20/2014 Discharge date: 05/21/2014  Discharge Diagnoses:  Primary problem: Chest pain likely costochondiritis  Acute bronchitis   Type 2 diabetes with nephropathy   Anemia Copd with recent exacerbation CAD  Discharge Condition: stable  Diet recommendation: carb modified/heart healthy  Filed Weights   05/20/14 2153 05/21/14 0123  Weight: 91.491 kg (201 lb 11.2 oz) 91.49 kg (201 lb 11.2 oz)    History of present illness:  74 yo female with htn, cad, s/p prior [place]ci/[place] x3 in [place] (10 yrs ago), dm2 with neuropathy, copd, osa, apparently c/o chest pain intermittently started this am. Usually sscp, "sharp" with occasional radiation to the left side. Slight sob. Denies fever, chills, n/v, hearburn, diarrhea, brbpr, black stool. No responsive to nitro her cp. Pt called 911, and EMS brought her to the hospital. Pt had EKG with nsr at 65, borderline lad, nl int, no st-t changes c/w ischemia. CXR negative. ED consulted cardiology, We appreciate their input. Pt was apparently seen by cardiology in 02/2014. With cardiac catherization at that time=> negative for significant stenosis. Pt will be admitted for evaluation of cp.   Hospital Course:  Observed on tele. MI ruled out. CT angiogram of chest showed no PE or infiltrate. Pt also reports cough for 1-2 weeks, on pred as outpt. Had wheezing on exam. CP may have been costochondritis. Discharged on zpak and prn mobic.  No need for myoview, as just had cath a few months ago which showed patent stents and minimal CAD. Discussed with Dr. Ron Parker who agrees no further cardiac workup needed.  Procedures: None  Consultations:  none  Discharge Exam: Filed Vitals:   05/21/14 0807  BP: 155/42  Pulse: 58  Temp: 98 F (36.7 C)  Resp: 18    General: comfortable Cardiovascular: RRR Respiratory:  CTA  Discharge Instructions   Discharge Instructions    Diet - low sodium heart healthy    Complete by:  As directed      Diet Carb Modified    Complete by:  As directed      Increase activity slowly    Complete by:  As directed           Current Discharge Medication List    START taking these medications   Details  azithromycin (ZITHROMAX) 250 MG tablet 2 tab po today, then 1 tab po daily until gone Qty: 6 each, Refills: 0    meloxicam (MOBIC) 15 MG tablet Take 1 tablet (15 mg total) by mouth daily as needed for pain. Qty: 15 tablet, Refills: 0      CONTINUE these medications which have NOT CHANGED   Details  acetaminophen (TYLENOL) 500 MG tablet Take 1,000 mg by mouth every 6 (six) hours as needed (pain).    albuterol (PROAIR HFA) 108 (90 BASE) MCG/ACT inhaler Inhale 2 puffs into the lungs every 4 (four) hours as needed for wheezing or shortness of breath.     albuterol (PROVENTIL) (2.5 MG/3ML) 0.083% nebulizer solution Take 2.5 mg by nebulization every 4 (four) hours as needed for wheezing or shortness of breath.    amLODipine (NORVASC) 5 MG tablet Take 10 mg by mouth daily.     aspirin EC 81 MG tablet Take 81 mg by mouth daily.    atorvastatin (LIPITOR) 40 MG tablet Take 40 mg by mouth at bedtime.    budesonide-formoterol (SYMBICORT) 160-4.5 MCG/ACT inhaler Inhale 2  puffs into the lungs 2 (two) times daily. RINSE MOUTH AFTER EACH USE    bumetanide (BUMEX) 1 MG tablet Take 1 mg by mouth daily.     busPIRone (BUSPAR) 15 MG tablet Take 15 mg by mouth 2 (two) times daily.     carvedilol (COREG) 12.5 MG tablet Take 12.5 mg by mouth 2 (two) times daily with a meal.     cyanocobalamin 500 MCG tablet Take 500 mcg by mouth daily. Vitamin B12    escitalopram (LEXAPRO) 10 MG tablet Take 10 mg by mouth at bedtime.     fluticasone (FLONASE) 50 MCG/ACT nasal spray Place 1 spray into both nostrils 2 (two) times daily.     Hypromellose (ARTIFICIAL TEARS OP) Place 1 drop into  both eyes 2 (two) times daily as needed (dry eyes).    insulin aspart (NOVOLOG) 100 UNIT/ML injection Inject 15 Units into the skin 3 (three) times daily after meals.     insulin glargine (LANTUS) 100 UNIT/ML injection Inject 37 Units into the skin daily.     levothyroxine (SYNTHROID, LEVOTHROID) 112 MCG tablet Take 112 mcg by mouth daily before breakfast.     montelukast (SINGULAIR) 10 MG tablet Take 10 mg by mouth at bedtime.    nitroGLYCERIN (NITROSTAT) 0.4 MG SL tablet Place 0.4 mg under the tongue every 5 (five) minutes as needed for chest pain.    Omega-3 Fatty Acids (FISH OIL) 1000 MG CAPS Take 1,000 mg by mouth at bedtime.     omeprazole (PRILOSEC) 20 MG capsule Take 20 mg by mouth 2 (two) times daily before a meal.     Potassium Gluconate 595 MG CAPS Take 595 mg by mouth at bedtime.     predniSONE (DELTASONE) 10 MG tablet 10-40 mg daily with breakfast. Tapered course started 05/19/14:  Take 4 tablets (40 mg) by mouth for 2 days, then take 3 tablets (30 mg) for 2 days, then take 2 tablets (20 mg) daily for 2 days, then take 1 tablet (10 mg) daily for 2 days, then stop    QUEtiapine (SEROQUEL) 25 MG tablet Take 25 mg by mouth at bedtime.     rOPINIRole (REQUIP) 1 MG tablet Take 1 mg by mouth 3 (three) times daily.    senna (SENOKOT) 8.6 MG TABS tablet Take 2 tablets by mouth at bedtime.     spironolactone (ALDACTONE) 25 MG tablet Take 25 mg by mouth daily.    tiotropium (SPIRIVA HANDIHALER) 18 MCG inhalation capsule Place 18 mcg into inhaler and inhale daily.    Vitamin E 400 UNITS TABS Take 400 Units by mouth at bedtime.       STOP taking these medications     nebivolol (BYSTOLIC) 5 MG tablet        Allergies  Allergen Reactions  . Enalapril Other (See Comments)    Angioedema  . Codeine Nausea And Vomiting  . Morphine And Related Nausea And Vomiting   Follow-up Information    Follow up with LADA, Satira Anis, MD.   Specialty:  Family Medicine   Why:  As needed,  If symptoms worsen   Contact information:   [place] [address] [phone]        The results of significant diagnostics from this hospitalization (including imaging, microbiology, ancillary and laboratory) are listed below for reference.    Significant Diagnostic Studies: Ct Angio Chest Pe W/cm &/or Wo Cm  05/21/2014   CLINICAL DATA:  Chest pain, shortness of breath.  EXAM:  CT ANGIOGRAPHY CHEST WITH CONTRAST  TECHNIQUE: Multidetector CT imaging of the chest was performed using the standard protocol during bolus administration of intravenous contrast. Multiplanar CT image reconstructions and MIPs were obtained to evaluate the vascular anatomy.  CONTRAST:  3mL OMNIPAQUE IOHEXOL 350 MG/ML SOLN  COMPARISON:  Chest radiograph of May 20, 2014.  FINDINGS: No pneumothorax or pleural effusion is noted. 3.5 mm nodule is noted laterally in right middle lobe. Another 3 mm nodule is noted more anteriorly in the right middle lobe. There is no evidence of pulmonary embolus. Atherosclerosis of thoracic aorta is noted without definite aneurysm formation. No definite evidence of thoracic aortic dissection is noted. Visualized portion of upper abdomen appears normal. No significant osseous abnormality is noted. Coronary artery calcifications are noted suggesting coronary artery disease. No mediastinal mass or adenopathy is noted.  Review of the MIP images confirms the above findings.  IMPRESSION: No evidence of pulmonary embolus.  Coronary artery calcifications are noted suggesting coronary artery disease.  Two nodules are noted in the right middle lobe, with the largest measuring 4 mm. If the patient is at high risk for bronchogenic carcinoma, follow-up chest CT at 1 year is recommended. If the patient is at low risk, no follow-up is needed. This recommendation follows the consensus statement: Guidelines for Management of Small Pulmonary Nodules Detected on CT Scans: A Statement from the [place]  as published in Radiology 2005; 237:395-400.   Electronically Signed   By: Marijo Conception, M.D.   On: 05/21/2014 10:27   Dg Chest Port 1 View  05/20/2014   CLINICAL DATA:  74 year old female with chest pain and cough.  EXAM: PORTABLE CHEST - 1 VIEW  COMPARISON:  02/22/2014  FINDINGS: The cardiomediastinal silhouette is unremarkable.  There is no evidence of focal airspace disease, pulmonary edema, suspicious pulmonary nodule/mass, pleural effusion, or pneumothorax. No acute bony abnormalities are identified.  IMPRESSION: No active disease.   Electronically Signed   By: Margarette Canada M.D.   On: 05/20/2014 22:59    Microbiology: No results found for this or any previous visit (from the past 240 hour(s)).   Labs: Basic Metabolic Panel:  Recent Labs Lab 05/20/14 2201 05/21/14 0735  NA 138 143  K 3.5 3.4*  CL 101 106  CO2 24 27  GLUCOSE 340* 92  BUN 12 11  CREATININE 1.09* 0.89  CALCIUM 8.7* 9.0   Liver Function Tests:  Recent Labs Lab 05/21/14 0735  AST 23  ALT 18  ALKPHOS 86  BILITOT 0.9  PROT 7.1  ALBUMIN 3.5   No results for input(s): LIPASE, AMYLASE in the last 168 hours. No results for input(s): AMMONIA in the last 168 hours. CBC:  Recent Labs Lab 05/20/14 2201 05/21/14 0735  WBC 11.3* 11.1*  HGB 10.1* 10.2*  HCT 32.4* 32.4*  MCV 81.6 82.4  PLT 246 214   Cardiac Enzymes:  Recent Labs Lab 05/21/14 0237 05/21/14 0735  TROPONINI <0.03 0.03   BNP: BNP (last 3 results)  Recent Labs  05/20/14 2201  BNP 212.8*    ProBNP (last 3 results) No results for input(s): PROBNP in the last 8760 hours.  CBG:  Recent Labs Lab 05/20/14 2156 05/21/14 0232 05/21/14 0726 05/21/14 1138  GLUCAP 337* 109* 101* 237*       Signed:  Kielee Care L  [facility] 05/21/2014, 12:24 PM

## 2014-05-21 NOTE — Progress Notes (Signed)
Chart reviewed. Patient examined. Had cath 3 months ago which showed widely patent stents and minimal CAD. No need for nuclear stress test. Discussed with Dr. Ron Parker who agrees. Patient has had a cough and wheezing, recent prednisone. Suspect pulmonary versus musculoskeletal etiology, but need to rule out PE as well. Will order CT angiogram of the chest. If negative, can go home today. Ordered diet.  Doree Barthel, MD [facility]

## 2014-05-21 NOTE — H&P (Signed)
Bonnie Henry is an 74 y.o. female.    Dr. Laga (pcp, [place], [place])  Chief Complaint: chest pain HPI: 74 yo female with htn, cad, s/[place]ci[place] in [place] (10 yrs ago), dm2 with neuropathy, copd, osa, apparently c/o chest pain intermittently started this am.  Usually sscp, "sharp" with occasional radiation to the left side.  Slight sob.   Denies fever, chills, n/v, hearburn, diarrhea, brbpr, black stool.  No responsive to nitro her cp.  Pt called 911, and EMS brought her to the hospital.  Pt had EKG with nsr at 65, borderline lad, nl int, no st-t changes c/w ischemia.  CXR negative. ED consulted cardiology,  We appreciate their input.  Pt was apparently seen by cardiology in 02/2014. With cardiac catherization at that time=> negative for significant stenosis.  Pt will be admitted for evaluation of cp.   Past Medical History  Diagnosis Date  . Hypertension   . GERD (gastroesophageal reflux disease)   . CAD (coronary artery disease)   . Hypercholesteremia   . COPD (chronic obstructive pulmonary disease)   . Morbid obesity   . Post-surgical hypothyroidism   . Depression, major   . Neuropathy   . Asthma   . Sleep apnea     "2015 wore mask; lost weight; told didn't need mask anymore" (02/23/2014)  . Type II diabetes mellitus   . Anxiety   . Anemia   . History of blood transfusion     "when I had my mastectomy"  . Arthritis     "joints ache all over"   . Chronic back pain   . Breast cancer, left breast     S/P mastectomy    Past Surgical History  Procedure Laterality Date  . Bartholin cyst marsupialization    . Trigger finger release Left   . Cataract extraction w/ intraocular lens  implant, bilateral Bilateral ~ 2011  . Total knee arthroplasty Left 1990's  . Total hip arthroplasty Bilateral 1990's  . Thyroidectomy, partial  1950's  . Shoulder surgery Right     "cleaned it out; no scope"  . Toe surgery Right     "cut piece of bone out so toe didn't dig into my foot"  .  Carotid endarterectomy Left 2009  . Hammer toe surgery Left     2nd and 3rd digits  . Tonsillectomy  1950's  . Joint replacement    . Breast biopsy Left   . Mastectomy Left 1990's  . Tubal ligation    . Vaginal hysterectomy      "partial"  . Coronary angioplasty with stent placement  1990's X 2    "2; 1"  . Cardiac catheterization  2000's  . Left heart catheterization with coronary angiogram N/A 02/24/2014    Procedure: LEFT HEART CATHETERIZATION WITH CORONARY ANGIOGRAM;  Surgeon: David W Harding, MD;  Location: [facility] CATH LAB;  Service: Cardiovascular;  Laterality: N/A;    Family History  Problem Relation Age of Onset  . Arthritis Mother   . Cancer Mother   . Diabetes Mother   . Heart disease Mother   . Hyperlipidemia Mother   . Hypertension Mother   . Alcohol abuse Father   . Alcohol abuse Brother   . Arthritis Brother   . Asthma Brother   . HIV Brother   . Cancer Brother   . Diabetes Brother   . Hyperlipidemia Brother   . Hypertension Brother   . Lung disease Brother   . Arthritis Maternal Grandmother   .   Brain cancer Father    Social History:  reports that she quit smoking about 20 years ago. Her smoking use included Cigarettes. She has a 66 pack-year smoking history. She has never used smokeless tobacco. She reports that she does not drink alcohol or use illicit drugs.  Allergies:  Allergies  Allergen Reactions  . Enalapril Other (See Comments)    Angioedema  . Codeine Nausea And Vomiting  . Morphine And Related Nausea And Vomiting   Medications reviewed  (Not in a hospital admission)  Results for orders placed or performed during the hospital encounter of 05/20/14 (from the past 48 hour(s))  CBG monitoring, ED     Status: Abnormal   Collection Time: 05/20/14  9:56 PM  Result Value Ref Range   Glucose-Capillary 337 (H) 65 - 99 mg/dL  CBC     Status: Abnormal   Collection Time: 05/20/14 10:01 PM  Result Value Ref Range   WBC 11.3 (H) 4.0 - 10.5 K/uL   RBC  3.97 3.87 - 5.11 MIL/uL   Hemoglobin 10.1 (L) 12.0 - 15.0 g/dL   HCT 32.4 (L) 36.0 - 46.0 %   MCV 81.6 78.0 - 100.0 fL   MCH 25.4 (L) 26.0 - 34.0 pg   MCHC 31.2 30.0 - 36.0 g/dL   RDW 14.7 11.5 - 15.5 %   Platelets 246 150 - 400 K/uL  Basic metabolic panel     Status: Abnormal   Collection Time: 05/20/14 10:01 PM  Result Value Ref Range   Sodium 138 135 - 145 mmol/L   Potassium 3.5 3.5 - 5.1 mmol/L   Chloride 101 101 - 111 mmol/L   CO2 24 22 - 32 mmol/L   Glucose, Bld 340 (H) 65 - 99 mg/dL   BUN 12 6 - 20 mg/dL   Creatinine, Ser 1.09 (H) 0.44 - 1.00 mg/dL   Calcium 8.7 (L) 8.9 - 10.3 mg/dL   GFR calc non Af Amer 49 (L) >60 mL/min   GFR calc Af Amer 57 (L) >60 mL/min    Comment: (NOTE) The eGFR has been calculated using the CKD EPI equation. This calculation has not been validated in all clinical situations. eGFR's persistently <60 mL/min signify possible Chronic Kidney Disease.    Anion gap 13 5 - 15  BNP (order ONLY if patient complains of dyspnea/SOB AND you have documented it for THIS visit)     Status: Abnormal   Collection Time: 05/20/14 10:01 PM  Result Value Ref Range   B Natriuretic Peptide 212.8 (H) 0.0 - 100.0 pg/mL  I-stat troponin, ED  (not at [facility], [facility])     Status: None   Collection Time: 05/20/14 10:10 PM  Result Value Ref Range   Troponin i, poc 0.00 0.00 - 0.08 ng/mL   Comment 3            Comment: Due to the release kinetics of cTnI, a negative result within the first hours of the onset of symptoms does not rule out myocardial infarction with certainty. If myocardial infarction is still suspected, repeat the test at appropriate intervals.    Dg Chest Port 1 View  05/20/2014   CLINICAL DATA:  74-year-old female with chest pain and cough.  EXAM: PORTABLE CHEST - 1 VIEW  COMPARISON:  02/22/2014  FINDINGS: The cardiomediastinal silhouette is unremarkable.  There is no evidence of focal airspace disease, pulmonary edema, suspicious pulmonary nodule/mass,  pleural effusion, or pneumothorax. No acute bony abnormalities are identified.  IMPRESSION: No active disease.     Electronically Signed   By: Margarette Canada M.D.   On: 05/20/2014 22:59    Review of Systems  Constitutional: Negative.   HENT: Negative.   Eyes: Negative.   Respiratory: Negative.   Cardiovascular: Positive for chest pain. Negative for palpitations, orthopnea, claudication, leg swelling and PND.  Gastrointestinal: Negative.   Genitourinary: Negative.   Musculoskeletal: Negative.   Skin: Negative.   Neurological: Negative.   Endo/Heme/Allergies: Negative.   Psychiatric/Behavioral: Negative.     Blood pressure 120/40, pulse 55, temperature 97.8 F (36.6 C), temperature source Oral, resp. rate 18, height 5' (1.524 m), weight 91.491 kg (201 lb 11.2 oz), SpO2 97 %. Physical Exam  Constitutional: She is oriented to person, place, and time. She appears well-developed and well-nourished.  HENT:  Head: Normocephalic and atraumatic.  Mouth/Throat: No oropharyngeal exudate.  Eyes: Conjunctivae and EOM are normal. Pupils are equal, round, and reactive to light. No scleral icterus.  Neck: Normal range of motion. Neck supple. No JVD present. No tracheal deviation present. No thyromegaly present.  Cardiovascular: Normal rate and regular rhythm.  Exam reveals no gallop and no friction rub.   Murmur heard. 2/6 sem rusb  Respiratory: Effort normal and breath sounds normal. No respiratory distress. She has no wheezes. She has no rales.  GI: Soft. Bowel sounds are normal. She exhibits no distension. There is no tenderness. There is no rebound and no guarding.  Musculoskeletal: Normal range of motion. She exhibits no edema or tenderness.  Lymphadenopathy:    She has no cervical adenopathy.  Neurological: She is alert and oriented to person, place, and time. She has normal reflexes. She displays normal reflexes. No cranial nerve deficit. She exhibits normal muscle tone. Coordination normal.   Skin: Skin is warm and dry. No rash noted. No erythema. No pallor.  Psychiatric: She has a normal mood and affect. Her behavior is normal. Judgment and thought content normal.     Assessment/Plan Cp Tele Trop i q6h x3 NPO,   Cardiology consultation per ED.  We appreciate their input. To determine if needs stress test vs no further w/up,  Stress test has been ordered for now while awaiting input.  Cont aspirin, lipitor, carvedilol Consider ranexa vs nitrate therapy  Dm2 fsbs q4h , iss  Anemia Check cbc in am  Hypothyroidism Check tsh Cont levothyroxine   DVT prophylaxis: scd, lovenox   Kiyoto Slomski 05/21/2014, 12:03 AM

## 2014-05-22 LAB — HEMOGLOBIN A1C
HEMOGLOBIN A1C: 7.7 % — AB (ref 4.8–5.6)
Mean Plasma Glucose: 174 mg/dL

## 2014-05-29 DIAGNOSIS — E119 Type 2 diabetes mellitus without complications: Secondary | ICD-10-CM | POA: Diagnosis not present

## 2014-05-29 DIAGNOSIS — I10 Essential (primary) hypertension: Secondary | ICD-10-CM | POA: Diagnosis not present

## 2014-05-29 DIAGNOSIS — F331 Major depressive disorder, recurrent, moderate: Secondary | ICD-10-CM | POA: Diagnosis not present

## 2014-05-29 DIAGNOSIS — G4733 Obstructive sleep apnea (adult) (pediatric): Secondary | ICD-10-CM | POA: Diagnosis not present

## 2014-05-29 DIAGNOSIS — I739 Peripheral vascular disease, unspecified: Secondary | ICD-10-CM | POA: Diagnosis not present

## 2014-06-12 ENCOUNTER — Telehealth: Payer: Self-pay | Admitting: Family Medicine

## 2014-06-12 MED ORDER — INSULIN ASPART 100 UNIT/ML ~~LOC~~ SOLN: 15.0000 [IU] | Freq: Three times a day (TID) | SUBCUTANEOUS | Status: DC

## 2014-06-12 MED ORDER — INSULIN GLARGINE 100 UNIT/ML ~~LOC~~ SOLN: 37.0000 [IU] | Freq: Every day | SUBCUTANEOUS | Status: DC

## 2014-06-12 NOTE — Telephone Encounter (Signed)
Pt called stated she is completely out of her insulin. Wants this called in now stated the pharmacy has been trying to reach [place] since last week. Medications are as follows:  Lantus Humalog  Pharm: [facility] on [address]

## 2014-06-12 NOTE — Telephone Encounter (Signed)
I approved 6 months of one in March, 6 months of the other in April Not sure what the problem is, but I'm sending more of each Rx

## 2014-06-22 DIAGNOSIS — I739 Peripheral vascular disease, unspecified: Secondary | ICD-10-CM | POA: Diagnosis not present

## 2014-06-26 ENCOUNTER — Ambulatory Visit (INDEPENDENT_AMBULATORY_CARE_PROVIDER_SITE_OTHER): Payer: Medicare Other | Admitting: Family Medicine

## 2014-06-26 ENCOUNTER — Other Ambulatory Visit: Payer: Self-pay

## 2014-06-26 ENCOUNTER — Encounter: Payer: Self-pay | Admitting: Family Medicine

## 2014-06-26 VITALS — BP 115/60 | HR 63 | Temp 98.1°F | Ht 62.0 in | Wt 206.0 lb

## 2014-06-26 DIAGNOSIS — Z5181 Encounter for therapeutic drug level monitoring: Secondary | ICD-10-CM

## 2014-06-26 DIAGNOSIS — E7800 Pure hypercholesterolemia, unspecified: Secondary | ICD-10-CM

## 2014-06-26 DIAGNOSIS — M48061 Spinal stenosis, lumbar region without neurogenic claudication: Secondary | ICD-10-CM

## 2014-06-26 DIAGNOSIS — E1121 Type 2 diabetes mellitus with diabetic nephropathy: Secondary | ICD-10-CM | POA: Diagnosis not present

## 2014-06-26 DIAGNOSIS — M4806 Spinal stenosis, lumbar region: Secondary | ICD-10-CM

## 2014-06-26 DIAGNOSIS — R600 Localized edema: Secondary | ICD-10-CM

## 2014-06-26 DIAGNOSIS — E890 Postprocedural hypothyroidism: Secondary | ICD-10-CM

## 2014-06-26 NOTE — Patient Instructions (Addendum)
Please do call Dr. Bethanne Ginger office and let him know about the extra five pounds of weight gain and see when you last echo was Do try the ACE bandage wraps on both feet and legs, start at the toes and wrap going up towards your knees Limit salt (sodium) to no more than 1500 mg a day if possible, and certainly no more than 2000 mg per day Do try to avoid saturated fats like bacon, sausage, hot dogs, bologna, cheese Limit eggs to no more than three per week Return for other symptoms and other problems next week To urgent care if migraine or other problems develop in the meantime Have fun and do something special for you (call an old friend, go out for frozen yogurt, sit in a park and feed ducks, something relaxing and fun)

## 2014-06-26 NOTE — Assessment & Plan Note (Signed)
Reviewed last several CBCs

## 2014-06-26 NOTE — Telephone Encounter (Signed)
She doesn't remember if she mentioned to you that she needs a refill of this during her appointment.

## 2014-06-26 NOTE — Assessment & Plan Note (Signed)
Check A1C today; monitor sugars 3x a day; limit sweets, limit white bread; foot exam by MD today; eye exam UTD; urine microalbumin just done and normal

## 2014-06-26 NOTE — Progress Notes (Signed)
BP 115/60 mmHg  Pulse 63  Temp(Src) 98.1 F (36.7 C)  Ht 5\' 2"  (1.575 m)  Wt 206 lb (93.441 kg)  BMI 37.67 kg/m2  SpO2 97%   Subjective:    Patient ID: Bonnie Henry, female    DOB: 06/20/1940, 74 y.o.   MRN: [id number]  HPI: Bonnie Henry is a 74 y.o. female  Chief Complaint  Patient presents with  . Hyperlipidemia  . Hypertension  . Diabetes  . Hypothyroidism   She has been having swelling in the legs for about a month; saw heart doctor two weeks ago and he changed one of her medicines; he will see her back in three months; she is gaining weight, puttin' it on left and right  She needs thyroid rechecked; she had her medicine changed; poor energy; moving BMs regularly; losing hair; skin is dry and wrinkled; easy bruising  With her diabetes, checking sugars 7 day average 184, 14 day average 190, 30 day average 195  Legs feel weak; she has spinal stenosis; she got a shot in her back, it helped the pain in her legs; not helping the lower back much at all; she is not taking anything; hard to get in and out of the tub or car, it's a struggle; someone talked to her about surgery, and she said no at the time; she is interested in getting back in to see the spine surgeon  High blood pressure; doing well today; tries to avoid salt  High cholesterol; did not come fasting, but only had cereal; had lowfat milk  Relevant past medical, surgical, family and social history reviewed and updated as indicated. Interim medical history since our last visit reviewed. Allergies and medications reviewed and updated.  Review of Systems  Constitutional: Positive for unexpected weight change (weight going up and up and up).  HENT: Negative for nosebleeds.   Respiratory: Positive for shortness of breath.   Gastrointestinal: Negative for blood in stool.  Genitourinary: Negative for hematuria.  Musculoskeletal: Positive for back pain.  Hematological: Bruises/bleeds easily.     Per HPI unless specifically indicated above     Objective:    BP 115/60 mmHg  Pulse 63  Temp(Src) 98.1 F (36.7 C)  Ht 5\' 2"  (1.575 m)  Wt 206 lb (93.441 kg)  BMI 37.67 kg/m2  SpO2 97%  Wt Readings from Last 3 Encounters:  06/26/14 206 lb (93.441 kg)  05/21/14 201 lb 11.2 oz (91.49 kg)  02/24/14 193 lb 12.6 oz (87.9 kg)    Physical Exam  Constitutional: She appears well-developed and well-nourished. No distress.  obese  HENT:  Head: Normocephalic and atraumatic.  Eyes: EOM are normal. No scleral icterus.  Neck: No thyromegaly present.  Cardiovascular: Normal rate, regular rhythm and normal heart sounds.   No murmur heard. Pulmonary/Chest: Effort normal and breath sounds normal. No respiratory distress. She has no wheezes.  Abdominal: Soft. Bowel sounds are normal. She exhibits no distension.  Musculoskeletal: Normal range of motion. She exhibits edema (bilateral nonpitting edema).  Neurological: She is alert. She exhibits normal muscle tone.  Skin: Skin is warm and dry. Ecchymosis noted. She is not diaphoretic. No cyanosis. No pallor.  Ecchymosis on extensor surface right forearm  Psychiatric: She has a normal mood and affect. Her behavior is normal. Judgment and thought content normal.    Results for orders placed or performed during the hospital encounter of 05/20/14  CBC  Result Value Ref Range   WBC 11.3 (H) 4.0 - 10.5  K/uL   RBC 3.97 3.87 - 5.11 MIL/uL   Hemoglobin 10.1 (L) 12.0 - 15.0 g/dL   HCT 32.4 (L) 36.0 - 46.0 %   MCV 81.6 78.0 - 100.0 fL   MCH 25.4 (L) 26.0 - 34.0 pg   MCHC 31.2 30.0 - 36.0 g/dL   RDW 14.7 11.5 - 15.5 %   Platelets 246 150 - 400 K/uL  Basic metabolic panel  Result Value Ref Range   Sodium 138 135 - 145 mmol/L   Potassium 3.5 3.5 - 5.1 mmol/L   Chloride 101 101 - 111 mmol/L   CO2 24 22 - 32 mmol/L   Glucose, Bld 340 (H) 65 - 99 mg/dL   BUN 12 6 - 20 mg/dL   Creatinine, Ser 1.09 (H) 0.44 - 1.00 mg/dL   Calcium 8.7 (L) 8.9 -  10.3 mg/dL   GFR calc non Af Amer 49 (L) >60 mL/min   GFR calc Af Amer 57 (L) >60 mL/min   Anion gap 13 5 - 15  BNP (order ONLY if patient complains of dyspnea/SOB AND you have documented it for THIS visit)  Result Value Ref Range   B Natriuretic Peptide 212.8 (H) 0.0 - 100.0 pg/mL  TSH  Result Value Ref Range   TSH 0.171 (L) 0.350 - 4.500 uIU/mL  Hemoglobin A1c  Result Value Ref Range   Hgb A1c MFr Bld 7.7 (H) 4.8 - 5.6 %   Mean Plasma Glucose 174 mg/dL  Troponin I (q 6hr x 3)  Result Value Ref Range   Troponin I <0.03 <0.031 ng/mL  Troponin I (q 6hr x 3)  Result Value Ref Range   Troponin I 0.03 <0.031 ng/mL  Troponin I (q 6hr x 3)  Result Value Ref Range   Troponin I <0.03 <0.031 ng/mL  Lipid panel  Result Value Ref Range   Cholesterol 166 0 - 200 mg/dL   Triglycerides 123 <150 mg/dL   HDL 70 >40 mg/dL   Total CHOL/HDL Ratio 2.4 RATIO   VLDL 25 0 - 40 mg/dL   LDL Cholesterol 71 0 - 99 mg/dL  CBC  Result Value Ref Range   WBC 11.1 (H) 4.0 - 10.5 K/uL   RBC 3.93 3.87 - 5.11 MIL/uL   Hemoglobin 10.2 (L) 12.0 - 15.0 g/dL   HCT 32.4 (L) 36.0 - 46.0 %   MCV 82.4 78.0 - 100.0 fL   MCH 26.0 26.0 - 34.0 pg   MCHC 31.5 30.0 - 36.0 g/dL   RDW 14.9 11.5 - 15.5 %   Platelets 214 150 - 400 K/uL  Comprehensive metabolic panel  Result Value Ref Range   Sodium 143 135 - 145 mmol/L   Potassium 3.4 (L) 3.5 - 5.1 mmol/L   Chloride 106 101 - 111 mmol/L   CO2 27 22 - 32 mmol/L   Glucose, Bld 92 65 - 99 mg/dL   BUN 11 6 - 20 mg/dL   Creatinine, Ser 0.89 0.44 - 1.00 mg/dL   Calcium 9.0 8.9 - 10.3 mg/dL   Total Protein 7.1 6.5 - 8.1 g/dL   Albumin 3.5 3.5 - 5.0 g/dL   AST 23 15 - 41 U/L   ALT 18 14 - 54 U/L   Alkaline Phosphatase 86 38 - 126 U/L   Total Bilirubin 0.9 0.3 - 1.2 mg/dL   GFR calc non Af Amer >60 >60 mL/min   GFR calc Af Amer >60 >60 mL/min   Anion gap 10 5 - 15  Glucose, capillary  Result Value Ref Range   Glucose-Capillary 109 (H) 65 - 99 mg/dL  Glucose,  capillary  Result Value Ref Range   Glucose-Capillary 101 (H) 65 - 99 mg/dL  Glucose, capillary  Result Value Ref Range   Glucose-Capillary 237 (H) 65 - 99 mg/dL  Glucose, capillary  Result Value Ref Range   Glucose-Capillary 219 (H) 65 - 99 mg/dL  I-stat troponin, ED  (not at [facility])  Result Value Ref Range   Troponin i, poc 0.00 0.00 - 0.08 ng/mL   Comment 3          CBG monitoring, ED  Result Value Ref Range   Glucose-Capillary 337 (H) 65 - 99 mg/dL      Assessment & Plan:   Problem List Items Addressed This Visit      Endocrine   Type 2 diabetes with nephropathy (Chronic)    Check A1C today; monitor sugars 3x a day; limit sweets, limit white bread; foot exam by MD today; eye exam UTD; urine microalbumin just done and normal      Relevant Medications   HUMALOG 100 UNIT/ML injection   Adult hypothyroidism    Check TSH today; adjust medicine if needed      Relevant Medications   BYSTOLIC 5 MG tablet   Other Relevant Orders   TSH     Other   High cholesterol (Chronic)   Relevant Medications   BYSTOLIC 5 MG tablet   Other Relevant Orders   Lipid Panel w/o Chol/HDL Ratio    Other Visit Diagnoses    Spinal stenosis of lumbar region    -  Primary    pt symptomatic; refer back to spine surgeon; politely declined offer for PT; last xray reviewed Nov 2015; no new xrays today considering lifetime radiation risk    Relevant Orders    Ambulatory referral to Orthopedic Surgery    Bilateral leg edema        I'll check thyroid; asked pt to contact Dr. Ubaldo Glassing about weight gain in case CHF; pt to wrap feet and avoid salt and elevate legs    Medication monitoring encounter        Relevant Orders    ALT        Follow up plan: Return in about 1 week (around 07/03/2014) for 30 minutes other problems.

## 2014-06-26 NOTE — Assessment & Plan Note (Signed)
Check TSH today; adjust medicine if needed

## 2014-06-27 LAB — TSH: TSH: 0.208 u[IU]/mL — AB (ref 0.450–4.500)

## 2014-06-27 LAB — LIPID PANEL W/O CHOL/HDL RATIO
Cholesterol, Total: 155 mg/dL (ref 100–199)
HDL: 64 mg/dL (ref >39)
LDL CALC: 65 mg/dL (ref 0–99)
Triglycerides: 130 mg/dL (ref 0–149)
VLDL Cholesterol Cal: 26 mg/dL (ref 5–40)

## 2014-06-27 LAB — ALT: ALT: 16 IU/L (ref 0–32)

## 2014-06-28 ENCOUNTER — Telehealth: Payer: Self-pay

## 2014-06-28 ENCOUNTER — Other Ambulatory Visit: Payer: Self-pay | Admitting: Family Medicine

## 2014-06-28 DIAGNOSIS — E7800 Pure hypercholesterolemia, unspecified: Secondary | ICD-10-CM

## 2014-06-28 DIAGNOSIS — E890 Postprocedural hypothyroidism: Secondary | ICD-10-CM

## 2014-06-28 MED ORDER — ATORVASTATIN CALCIUM 40 MG PO TABS: 40.0000 mg | ORAL_TABLET | Freq: Every day | ORAL | Status: DC

## 2014-06-28 MED ORDER — LEVOTHYROXINE SODIUM 100 MCG PO TABS: 100.0000 ug | ORAL_TABLET | Freq: Every day | ORAL | Status: DC

## 2014-06-28 MED ORDER — OMEPRAZOLE 20 MG PO CPDR: 20.0000 mg | DELAYED_RELEASE_CAPSULE | Freq: Two times a day (BID) | ORAL | Status: DC | PRN

## 2014-06-28 NOTE — Telephone Encounter (Signed)
-----   Message from Arnetha Courser, MD sent at 06/28/2014  2:08 PM EDT ----- Let pt know thyroid test still a little off; adjust dose, new Rx already sent; recheck TSH in 8 weeks (order entered) Cholesterol and liver function normal; continue med; I sent Rx for more atorvastatin Thank you, Dr. Sanda Klein

## 2014-06-28 NOTE — Telephone Encounter (Signed)
Patient notified

## 2014-07-06 ENCOUNTER — Encounter: Payer: Self-pay | Admitting: Family Medicine

## 2014-07-06 ENCOUNTER — Ambulatory Visit (INDEPENDENT_AMBULATORY_CARE_PROVIDER_SITE_OTHER): Payer: Medicare Other | Admitting: Family Medicine

## 2014-07-06 VITALS — BP 101/55 | HR 67 | Temp 98.4°F | Wt 203.0 lb

## 2014-07-06 DIAGNOSIS — M129 Arthropathy, unspecified: Secondary | ICD-10-CM

## 2014-07-06 DIAGNOSIS — M25511 Pain in right shoulder: Secondary | ICD-10-CM

## 2014-07-06 DIAGNOSIS — G545 Neuralgic amyotrophy: Secondary | ICD-10-CM | POA: Diagnosis not present

## 2014-07-06 DIAGNOSIS — E890 Postprocedural hypothyroidism: Secondary | ICD-10-CM

## 2014-07-06 DIAGNOSIS — F331 Major depressive disorder, recurrent, moderate: Secondary | ICD-10-CM | POA: Diagnosis not present

## 2014-07-06 DIAGNOSIS — M542 Cervicalgia: Secondary | ICD-10-CM | POA: Diagnosis not present

## 2014-07-06 NOTE — Patient Instructions (Addendum)
[facility] Staff --> check on the referral to see the back doctor (placed last visit); help patient make sure that visit is scheduled before she leaves We'll check labs today and let you know those results; I will be out of the office tomorrow but someone else can contact you if needed before I return Do get neck xray if the labs are negative Keep appointment to see the back doctor If you change your mind about physical therapy, let me know Return for diabetes and other follow-up around August 16th or just after

## 2014-07-06 NOTE — Progress Notes (Signed)
BP 101/55 mmHg  Pulse 67  Temp(Src) 98.4 F (36.9 C)  Wt 203 lb (92.08 kg)  SpO2 95%   Subjective:    Patient ID: Bonnie Henry, female    DOB: [birth year], 74 y.o.   MRN: [id number]  HPI: Bonnie Henry is a 74 y.o. female  Chief Complaint  Patient presents with  . Back Pain   She is here for follow-up of multiple issues; she is having back pain and has MRI scheduled but daughter not sure she can go; they'll call if they can't go; quality of the pain depends how she moves Pain goes up to a 10 out of 10; to bring that pain down, she will try heating pad, helps a little bit, soothes it; taking tramadol, it is no miracle cure and helps make it tolerable; tramadol makes her sick She was offered to see physical therapist but has declined; does not want to go She was referred to back specialist last visit; patient has not heard back about that visit  Also having pain in the neck and upper back; she has a knot around the neck and pain goes into the collar bones and shoulders, right more than left; the muscles in her arms just hurt; when she goes to lift her arm, abduction, diminishing ROM in the right arm; right-handed; losing strength in the right arm for a while; no numbness or tingling; previous back doctor did not evaluate neck b/c it was not an issue back then; this only just started around mid-May; around hospital stay but not related to the stay  The legs hurt too; she got the shot in her back and that helped the pain  Labs drawn at the last visit and thyroid was off; she has not changed medicine yet but will pick that up today; she has not felt any lumps, no sticking when she swallows; she shakes a lot at times; starts shaking; lasts all day, may be just a little while; not related to caffeine; not sure if related to sleep; handwriting has gotten sloppy but not small; both arms shake; not related to albuterol to her knowledge   Current Outpatient Prescriptions on File  Prior to Visit  Medication Sig Dispense Refill  . acetaminophen (TYLENOL) 500 MG tablet Take 1,000 mg by mouth every 6 (six) hours as needed (pain).    Marland Kitchen albuterol (PROAIR HFA) 108 (90 BASE) MCG/ACT inhaler Inhale 2 puffs into the lungs every 4 (four) hours as needed for wheezing or shortness of breath.     Marland Kitchen albuterol (PROVENTIL) (2.5 MG/3ML) 0.083% nebulizer solution Take 2.5 mg by nebulization every 4 (four) hours as needed for wheezing or shortness of breath.    Marland Kitchen amLODipine (NORVASC) 5 MG tablet Take 10 mg by mouth daily.     Marland Kitchen aspirin EC 81 MG tablet Take 81 mg by mouth daily.    Marland Kitchen atorvastatin (LIPITOR) 40 MG tablet Take 1 tablet (40 mg total) by mouth at bedtime. 30 tablet 5  . budesonide-formoterol (SYMBICORT) 160-4.5 MCG/ACT inhaler Inhale 2 puffs into the lungs 2 (two) times daily. RINSE MOUTH AFTER EACH USE    . bumetanide (BUMEX) 1 MG tablet Take 1 mg by mouth daily.     . busPIRone (BUSPAR) 15 MG tablet Take 15 mg by mouth 2 (two) times daily.     Marland Kitchen BYSTOLIC 5 MG tablet Take 5 mg by mouth daily.    . carvedilol (COREG) 12.5 MG tablet Take 12.5 mg by mouth  2 (two) times daily with a meal.     . cyanocobalamin 500 MCG tablet Take 500 mcg by mouth daily. Vitamin B12    . escitalopram (LEXAPRO) 10 MG tablet Take 10 mg by mouth at bedtime.     . fluticasone (FLONASE) 50 MCG/ACT nasal spray Place 1 spray into both nostrils 2 (two) times daily.     Marland Kitchen HUMALOG 100 UNIT/ML injection     . Hypromellose (ARTIFICIAL TEARS OP) Place 1 drop into both eyes 2 (two) times daily as needed (dry eyes).    . insulin glargine (LANTUS) 100 UNIT/ML injection Inject 0.37 mLs (37 Units total) into the skin daily. 1 vial 3  . levothyroxine (SYNTHROID, LEVOTHROID) 100 MCG tablet Take 1 tablet (100 mcg total) by mouth daily. 30 tablet 1  . meloxicam (MOBIC) 15 MG tablet Take 1 tablet (15 mg total) by mouth daily as needed for pain. 15 tablet 0  . montelukast (SINGULAIR) 10 MG tablet Take 10 mg by mouth at  bedtime.    . nitroGLYCERIN (NITROSTAT) 0.4 MG SL tablet Place 0.4 mg under the tongue every 5 (five) minutes as needed for chest pain.    . Omega-3 Fatty Acids (FISH OIL) 1000 MG CAPS Take 1,000 mg by mouth at bedtime.     Marland Kitchen omeprazole (PRILOSEC) 20 MG capsule Take 1 capsule (20 mg total) by mouth 2 (two) times daily as needed. Caution: use may increase risk of pneumonia, osteoporosis, anemia 60 capsule 2  . Potassium Gluconate 595 MG CAPS Take 595 mg by mouth at bedtime.     Marland Kitchen QUEtiapine (SEROQUEL) 25 MG tablet Take 25 mg by mouth at bedtime.     Marland Kitchen rOPINIRole (REQUIP) 1 MG tablet Take 1 mg by mouth 3 (three) times daily.    Marland Kitchen senna (SENOKOT) 8.6 MG TABS tablet Take 2 tablets by mouth at bedtime.     . sertraline (ZOLOFT) 100 MG tablet Take 100 mg by mouth daily.    Marland Kitchen spironolactone (ALDACTONE) 25 MG tablet Take 25 mg by mouth daily.    Marland Kitchen tiotropium (SPIRIVA HANDIHALER) 18 MCG inhalation capsule Place 18 mcg into inhaler and inhale daily.    . Vitamin E 400 UNITS TABS Take 400 Units by mouth at bedtime.      No current facility-administered medications on file prior to visit.   Relevant past medical, surgical, family and social history reviewed and updated as indicated. Interim medical history since our last visit reviewed. Allergies and medications reviewed and updated.  Review of Systems  Per HPI unless specifically indicated above     Objective:    BP 101/55 mmHg  Pulse 67  Temp(Src) 98.4 F (36.9 C)  Wt 203 lb (92.08 kg)  SpO2 95%  Wt Readings from Last 3 Encounters:  07/06/14 203 lb (92.08 kg)  06/26/14 206 lb (93.441 kg)  05/21/14 201 lb 11.2 oz (91.49 kg)    Physical Exam  Constitutional: She appears well-developed and well-nourished. No distress (but appears uncomfortable).  Neck: Spinous process tenderness and muscular tenderness present. No rigidity. Decreased range of motion (limited ear to shoulder left and right) present. No edema present.  Musculoskeletal:   Limited ROM with abduction of the right shoulder; shoulder shrug causes pain bilaterally; bony enlargement of right wrist; tenderness across the base of the right thumb  Neurological: She is alert.  Grip strength 4/5 right, 4+/5 left; no fasciculations; no atrophy of muscles of UE  Psychiatric: Her speech is normal and behavior is normal.  Cognition and memory are normal.    Results for orders placed or performed in visit on 06/26/14  Lipid Panel w/o Chol/HDL Ratio  Result Value Ref Range   Cholesterol, Total 155 100 - 199 mg/dL   Triglycerides 130 0 - 149 mg/dL   HDL 64 >39 mg/dL   VLDL Cholesterol Cal 26 5 - 40 mg/dL   LDL Calculated 65 0 - 99 mg/dL  ALT  Result Value Ref Range   ALT 16 0 - 32 IU/L  TSH  Result Value Ref Range   TSH 0.208 (L) 0.450 - 4.500 uIU/mL      Assessment & Plan:   Problem List Items Addressed This Visit      Endocrine   Adult hypothyroidism    She has not started the new dose yet, so she will pick that up today; recheck TSH in 6-8 weeks        Nervous and Auditory   Shoulder girdle syndrome - Primary   Relevant Orders   Sed Rate (ESR)   C-reactive protein   ANA w/Reflex if Positive   Rheumatoid Factor    Other Visit Diagnoses    Right shoulder pain        Relevant Orders    Sed Rate (ESR)    C-reactive protein    ANA w/Reflex if Positive    Rheumatoid Factor    Spine pain, cervical        Relevant Orders    Sed Rate (ESR)    DG Cervical Spine Complete    Arthritis involving multiple sites        Relevant Orders    Sed Rate (ESR)    C-reactive protein    ANA w/Reflex if Positive    Rheumatoid Factor        Follow up plan: Return in about 5 weeks (around 08/10/2014) for August 16th or just after.  Orders Placed This Encounter  Procedures  . DG Cervical Spine Complete    Standing Status: Future     Number of Occurrences:      Standing Expiration Date: 09/05/2015    Order Specific Question:  Reason for Exam (SYMPTOM  OR  DIAGNOSIS REQUIRED)    Answer:  neck pain with right arm radiculopathy    Order Specific Question:  Preferred imaging location?    Answer:  [facility]  . Sed Rate (ESR)  . C-reactive protein  . ANA w/Reflex if Positive  . Rheumatoid Factor   An After Visit Summary was printed and given to the patient.

## 2014-07-06 NOTE — Assessment & Plan Note (Signed)
She has not started the new dose yet, so she will pick that up today; recheck TSH in 6-8 weeks

## 2014-07-07 LAB — SEDIMENTATION RATE: Sed Rate: 53 mm/hr — ABNORMAL HIGH (ref 0–40)

## 2014-07-07 LAB — RHEUMATOID FACTOR: Rhuematoid fact SerPl-aCnc: 16.7 IU/mL — ABNORMAL HIGH (ref 0.0–13.9)

## 2014-07-07 LAB — C-REACTIVE PROTEIN: CRP: 20.8 mg/L — ABNORMAL HIGH (ref 0.0–4.9)

## 2014-07-07 LAB — ANA W/REFLEX IF POSITIVE: Anti Nuclear Antibody(ANA): NEGATIVE

## 2014-07-09 ENCOUNTER — Telehealth: Payer: Self-pay | Admitting: Family Medicine

## 2014-07-09 DIAGNOSIS — M353 Polymyalgia rheumatica: Secondary | ICD-10-CM

## 2014-07-09 DIAGNOSIS — R768 Other specified abnormal immunological findings in serum: Secondary | ICD-10-CM

## 2014-07-09 MED ORDER — PREDNISONE 10 MG PO TABS
10.0000 mg | ORAL_TABLET | Freq: Two times a day (BID) | ORAL | Status: DC
Start: 2014-07-09 — End: 2014-09-12

## 2014-07-09 NOTE — Telephone Encounter (Signed)
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; prednisone will increase sugars as we discussed, so really watch/limit sweets, white bread, monitor sugars I'll put in referral to rheumatologist Rx includes note to NOT stop prednisone abruptly; needs to be tapered ------------------ Epic crashed, so I called pharmacy to confirm they received prednisone Rx; they did I asked pharmacist if patient is filling both bystolic and carvedilolol, and she said yes; they are coming from two other doctors, one [facility] clinic cardiologist; I advised Raquel Sarna (pharmacist) to talk with patient about this, as she should not be on both, and direct her to contact prescriber(s) to find out which ONE she should be taking I left another message for patient explaining she is getting two of the same kind of medicine from other doctors, and she should call her cardiologist (whoever is ON-CALL today) to find out which ONE she should be taking; I do not believe she should be taking both, but will direct her to talk to her cardiologist about this

## 2014-07-31 DIAGNOSIS — F4310 Post-traumatic stress disorder, unspecified: Secondary | ICD-10-CM | POA: Diagnosis not present

## 2014-07-31 DIAGNOSIS — F411 Generalized anxiety disorder: Secondary | ICD-10-CM | POA: Diagnosis not present

## 2014-07-31 DIAGNOSIS — F331 Major depressive disorder, recurrent, moderate: Secondary | ICD-10-CM | POA: Diagnosis not present

## 2014-08-01 ENCOUNTER — Other Ambulatory Visit: Payer: Self-pay | Admitting: Family Medicine

## 2014-08-04 ENCOUNTER — Other Ambulatory Visit: Payer: Self-pay | Admitting: Family Medicine

## 2014-08-04 NOTE — Telephone Encounter (Signed)
Routing to provider  

## 2014-08-04 NOTE — Telephone Encounter (Signed)
I prescribed 30+1 refill on June 22nd She should not be out She will be due for a TSH recheck before she runs out of this prescription Please resolve I did not hit any "x" on the prescription since I appeared to erase a prescription on the last encounter for another patient I tried to handle She does not need a refill

## 2014-08-07 ENCOUNTER — Other Ambulatory Visit: Payer: Self-pay | Admitting: Family Medicine

## 2014-08-07 DIAGNOSIS — L603 Nail dystrophy: Secondary | ICD-10-CM | POA: Diagnosis not present

## 2014-08-07 DIAGNOSIS — M25571 Pain in right ankle and joints of right foot: Secondary | ICD-10-CM | POA: Diagnosis not present

## 2014-08-07 DIAGNOSIS — E1151 Type 2 diabetes mellitus with diabetic peripheral angiopathy without gangrene: Secondary | ICD-10-CM | POA: Diagnosis not present

## 2014-08-07 DIAGNOSIS — L84 Corns and callosities: Secondary | ICD-10-CM | POA: Diagnosis not present

## 2014-08-07 DIAGNOSIS — I739 Peripheral vascular disease, unspecified: Secondary | ICD-10-CM | POA: Diagnosis not present

## 2014-08-07 NOTE — Telephone Encounter (Signed)
Previous rx was sent to her old pharmacy. Called her new pharmacy and advised them that she should have a refill remaining at her other pharmacy. I asked that they transfer her rx for it.

## 2014-08-07 NOTE — Telephone Encounter (Signed)
Pharmacy notified that she has refills remaining.

## 2014-08-09 ENCOUNTER — Other Ambulatory Visit: Payer: Self-pay | Admitting: Family Medicine

## 2014-08-09 ENCOUNTER — Telehealth: Payer: Self-pay

## 2014-08-09 NOTE — Telephone Encounter (Signed)
Routing to provider  

## 2014-08-09 NOTE — Telephone Encounter (Signed)
Pt's daughter wants to make sure the pharmacy we have on file for the pt is [facility] [address] in [place]. Thanks.

## 2014-08-09 NOTE — Telephone Encounter (Signed)
Pharmacy notified that rx was sent to her previous pharmacy back on 06/28/14 and asked that they have the rx and any refills transferred over.

## 2014-08-09 NOTE — Telephone Encounter (Signed)
Correct pharmacy is listed. 

## 2014-08-17 DIAGNOSIS — M150 Primary generalized (osteo)arthritis: Secondary | ICD-10-CM | POA: Diagnosis not present

## 2014-08-17 DIAGNOSIS — M25511 Pain in right shoulder: Secondary | ICD-10-CM | POA: Diagnosis not present

## 2014-08-17 DIAGNOSIS — M25512 Pain in left shoulder: Secondary | ICD-10-CM | POA: Diagnosis not present

## 2014-08-17 DIAGNOSIS — M545 Low back pain: Secondary | ICD-10-CM | POA: Diagnosis not present

## 2014-08-17 DIAGNOSIS — M542 Cervicalgia: Secondary | ICD-10-CM | POA: Diagnosis not present

## 2014-08-17 DIAGNOSIS — R700 Elevated erythrocyte sedimentation rate: Secondary | ICD-10-CM | POA: Diagnosis not present

## 2014-08-21 ENCOUNTER — Ambulatory Visit: Payer: Medicare Other | Admitting: Family Medicine

## 2014-08-23 NOTE — Telephone Encounter (Signed)
Disregard

## 2014-08-24 ENCOUNTER — Ambulatory Visit: Payer: Medicare Other | Admitting: Family Medicine

## 2014-08-31 ENCOUNTER — Ambulatory Visit (INDEPENDENT_AMBULATORY_CARE_PROVIDER_SITE_OTHER): Payer: Medicare Other | Admitting: Family Medicine

## 2014-08-31 ENCOUNTER — Encounter: Payer: Self-pay | Admitting: Family Medicine

## 2014-08-31 VITALS — BP 105/62 | HR 72 | Temp 97.9°F | Ht 58.5 in | Wt 202.0 lb

## 2014-08-31 DIAGNOSIS — I10 Essential (primary) hypertension: Secondary | ICD-10-CM | POA: Diagnosis not present

## 2014-08-31 DIAGNOSIS — R700 Elevated erythrocyte sedimentation rate: Secondary | ICD-10-CM | POA: Diagnosis not present

## 2014-08-31 DIAGNOSIS — E1140 Type 2 diabetes mellitus with diabetic neuropathy, unspecified: Secondary | ICD-10-CM | POA: Diagnosis not present

## 2014-08-31 DIAGNOSIS — R140 Abdominal distension (gaseous): Secondary | ICD-10-CM | POA: Diagnosis not present

## 2014-08-31 DIAGNOSIS — E890 Postprocedural hypothyroidism: Secondary | ICD-10-CM

## 2014-08-31 DIAGNOSIS — F329 Major depressive disorder, single episode, unspecified: Secondary | ICD-10-CM

## 2014-08-31 DIAGNOSIS — M4806 Spinal stenosis, lumbar region: Secondary | ICD-10-CM | POA: Diagnosis not present

## 2014-08-31 DIAGNOSIS — J449 Chronic obstructive pulmonary disease, unspecified: Secondary | ICD-10-CM

## 2014-08-31 DIAGNOSIS — M2041 Other hammer toe(s) (acquired), right foot: Secondary | ICD-10-CM | POA: Diagnosis not present

## 2014-08-31 DIAGNOSIS — R110 Nausea: Secondary | ICD-10-CM

## 2014-08-31 DIAGNOSIS — L84 Corns and callosities: Secondary | ICD-10-CM | POA: Diagnosis not present

## 2014-08-31 DIAGNOSIS — M2040 Other hammer toe(s) (acquired), unspecified foot: Secondary | ICD-10-CM | POA: Insufficient documentation

## 2014-08-31 DIAGNOSIS — M25512 Pain in left shoulder: Secondary | ICD-10-CM | POA: Diagnosis not present

## 2014-08-31 DIAGNOSIS — F32A Depression, unspecified: Secondary | ICD-10-CM

## 2014-08-31 DIAGNOSIS — R002 Palpitations: Secondary | ICD-10-CM | POA: Diagnosis not present

## 2014-08-31 DIAGNOSIS — M150 Primary generalized (osteo)arthritis: Secondary | ICD-10-CM | POA: Diagnosis not present

## 2014-08-31 NOTE — Assessment & Plan Note (Signed)
Seeing Dr. Raul Del for this; encouraged her to call him

## 2014-08-31 NOTE — Progress Notes (Signed)
BP 105/62 mmHg  Pulse 72  Temp(Src) 97.9 F (36.6 C)  Ht 4' 10.5" (1.486 m)  Wt 202 lb (91.627 kg)  BMI 41.49 kg/m2  SpO2 95%   Subjective:    Patient ID: Bonnie Henry, female    DOB: 30-Jan-1940, 73 y.o.   MRN: [id number]  HPI: Bonnie Henry is a 74 y.o. female  Chief Complaint  Patient presents with  . Paperwork    form for Diabetic shoes   Diabetes mellitus type 2 Patient has had diabetes many years Checking sugars?  yes How often? 4x a day Range (low to high) over last two weeks:  116 and 339  Yesterday: 268 and 172 174, 167, 214, 268, 281, 138,  Feels diabetes is under fair control Does patient feel additional teaching/training would be helpful?  no Trying to limit white bread, white rice, white potatoes, sweets?  yes sometimes Trying to limit sweetened drinks like iced tea, soft drinks, sports drinks, fruit juices?  yes Exercise/activity level:  sedentary (essential no activity) Checking feet every day/night?  yes Last eye exam:  One year ago Diabetes-associated complications:  neuropathy Lab Results  Component Value Date   HGBA1C 8.1* 08/31/2014   She went to the arthritis doctor; she goes in today for the results of the labs and took xrays of the neck and spine; she is still in a lot of pain; goes to see Dr. Jefm Bryant at 1:45 pm today; she weaned herself off the prednisone, last dose was last week; half of a pill for about ten days and then stopped under the direction of Dr. Jefm Bryant; the prednisone increased the sugars some  Starting to feel sick at times; nauseated; just pain running through her from neck on down; she does not think the pain is related to the nausea; the nausea does not happen after she eats; she has been seen by GI for the nausea; they did EGD and colonoscopy; no vomiting; no blood in the stool, no fevers; just doesn't feel good all over; trouble feeling sleepy too; still losing hair  Thyroid due to be checked today; dose  adjusted in June; still losing hair, that hasn't slowed down  Her breathing is not as good as it should be; harder to breathe; short of breath; wheezing; not really coughing  Relevant past medical, surgical, family and social history reviewed and updated as indicated. Interim medical history since our last visit reviewed. Allergies and medications reviewed and updated.  Review of Systems Per HPI unless specifically indicated above     Objective:    BP 105/62 mmHg  Pulse 72  Temp(Src) 97.9 F (36.6 C)  Ht 4' 10.5" (1.486 m)  Wt 202 lb (91.627 kg)  BMI 41.49 kg/m2  SpO2 95%  Wt Readings from Last 3 Encounters:  08/31/14 202 lb (91.627 kg)  07/06/14 203 lb (92.08 kg)  06/26/14 206 lb (93.441 kg)    Physical Exam  Constitutional: She appears well-developed and well-nourished. No distress.  HENT:  Head: Normocephalic and atraumatic.  Eyes: EOM are normal. No scleral icterus.  Neck: No thyromegaly present.  Cardiovascular: Normal rate, regular rhythm and normal heart sounds.   No murmur heard. Pulmonary/Chest: Effort normal and breath sounds normal. No respiratory distress. She has no wheezes.  Abdominal: Soft. Bowel sounds are normal. She exhibits no distension.  Musculoskeletal: Normal range of motion. She exhibits no edema.  2nd and 3rd digits hammertoe formation  Neurological: She is alert. She exhibits normal muscle tone.  Skin:  Skin is warm and dry. She is not diaphoretic. No pallor.  Psychiatric: She has a normal mood and affect. Her behavior is normal. Judgment and thought content normal.   Diabetic Foot Form - Detailed   Diabetic Foot Exam - detailed  Diabetic Foot exam was performed with the following findings:  Yes 08/31/2014  9:06 AM  Visual Foot Exam completed.:  Yes  Is there a history of foot ulcer?:  No  Can the patient see the bottom of their feet?:  No  Are the shoes appropriate in style and fit?:  Yes  Is there swelling or and abnormal foot shape?:  Yes   Are the toenails long?:  No  Are the toenails thick?:  Yes  Do you have pain in calf while walking?:  Yes  Is there a claw toe deformity?:  No  Is there elevated skin temparature?:  No  Is there limited skin dorsiflexion?:  No  Is there foot or ankle muscle weakness?:  No  Are the toenails ingrown?:  No  Normal Range of Motion:  Yes    Pulse Foot Exam completed.:  Yes  Right Dorsalis Pedis:  Present Left Dorsalis Pedis:  Present  Sensory Foot Exam Completed.:  Yes  Swelling:  No  Semmes-Weinstein Monofilament Test  R Site 1-Great Toe:  Neg L Site 1-Great Toe:  Pos (Comment: diminished compared to proximal leg)  R Site 4:  Neg L Site 4:  Pos (Comment: diminished compared to proximal leg)    Comments:  Callus on the plantar surface of the right ball of foot        Assessment & Plan:   Problem List Items Addressed This Visit      Respiratory   COPD, moderate    Seeing Dr. Raul Del for this; encouraged her to call him        Endocrine   Adult hypothyroidism    Check TSH today and adjust thyroid medicine dose if indicated      Diabetes mellitus with diabetic neuropathy - Primary    Rx given for shoes today; see formed; sugars up because of prednisone      Relevant Orders   Hgb A1c w/o eAG (Completed)     Musculoskeletal and Integument   Hammertoe    Noted, diabetic shoe order filled out      Foot callus    Noted, diabetic shoe order filled out        Other   Clinical depression    Chronic major depressive disorder; encouraged patient to contact her psychiatrist; she should not be on two SSRIs and that doctor should be aware of what meds she takes and adjust or discontinue      Nausea    She has already seen GI, had EGD, colonoscopy; will get pelvic US since ovaries intact      Relevant Orders   US Pelvis Complete   Comprehensive metabolic panel (Completed)   CBC with Differential/Platelet (Completed)   Abdominal bloating    She has already seen GI, had  EGD, colonoscopy; will get pelvic US since ovaries intact      Relevant Orders   US Pelvis Complete   Comprehensive metabolic panel (Completed)   CBC with Differential/Platelet (Completed)      Follow up plan: Return in about 2 weeks (around 09/14/2014) for for nausea, after US done.

## 2014-08-31 NOTE — Assessment & Plan Note (Signed)
Rx given for shoes today; see formed; sugars up because of prednisone

## 2014-08-31 NOTE — Assessment & Plan Note (Addendum)
Check TSH today and adjust thyroid medicine dose if indicated

## 2014-08-31 NOTE — Patient Instructions (Addendum)
We will send off the prescription for your diabetic shoes Keep up the good work with weight loss, try to lose another five pounds before next visit by cutting back on portions and drinking plenty of water We'll let you know what the labs show If symptoms persist, let me know Follow-up after the ultrasound You should NOT be taking both the escitalopram and sertraline; they do the exact same thing and you should not take both medicines Please do contact the psychiatrist and we'll ask that doctor to manage your depression and mood medicines completely

## 2014-09-01 ENCOUNTER — Other Ambulatory Visit: Payer: Self-pay | Admitting: Rheumatology

## 2014-09-01 DIAGNOSIS — R29898 Other symptoms and signs involving the musculoskeletal system: Secondary | ICD-10-CM

## 2014-09-01 DIAGNOSIS — M48061 Spinal stenosis, lumbar region without neurogenic claudication: Secondary | ICD-10-CM

## 2014-09-01 LAB — CBC WITH DIFFERENTIAL/PLATELET
Basophils Absolute: 0 10*3/uL (ref 0.0–0.2)
Basos: 0 %
EOS (ABSOLUTE): 0.1 10*3/uL (ref 0.0–0.4)
Eos: 2 %
Hematocrit: 30.8 % — ABNORMAL LOW (ref 34.0–46.6)
Hemoglobin: 10.2 g/dL — ABNORMAL LOW (ref 11.1–15.9)
IMMATURE GRANS (ABS): 0 10*3/uL (ref 0.0–0.1)
IMMATURE GRANULOCYTES: 0 %
Lymphocytes Absolute: 1.8 10*3/uL (ref 0.7–3.1)
Lymphs: 23 %
MCH: 25.3 pg — ABNORMAL LOW (ref 26.6–33.0)
MCHC: 33.1 g/dL (ref 31.5–35.7)
MCV: 76 fL — ABNORMAL LOW (ref 79–97)
Monocytes Absolute: 0.8 10*3/uL (ref 0.1–0.9)
Monocytes: 10 %
Neutrophils Absolute: 5.1 10*3/uL (ref 1.4–7.0)
Neutrophils: 65 %
Platelets: 213 10*3/uL (ref 150–379)
RBC: 4.03 x10E6/uL (ref 3.77–5.28)
RDW: 17.9 % — ABNORMAL HIGH (ref 12.3–15.4)
WBC: 8 10*3/uL (ref 3.4–10.8)

## 2014-09-01 LAB — COMPREHENSIVE METABOLIC PANEL
A/G RATIO: 1.5 (ref 1.1–2.5)
ALT: 18 IU/L (ref 0–32)
AST: 26 IU/L (ref 0–40)
Albumin: 4 g/dL (ref 3.5–4.8)
Alkaline Phosphatase: 105 IU/L (ref 39–117)
BUN/Creatinine Ratio: 9 — ABNORMAL LOW (ref 11–26)
BUN: 11 mg/dL (ref 8–27)
Bilirubin Total: 0.9 mg/dL (ref 0.0–1.2)
CHLORIDE: 96 mmol/L — AB (ref 97–108)
CO2: 27 mmol/L (ref 18–29)
Calcium: 8.9 mg/dL (ref 8.7–10.3)
Creatinine, Ser: 1.22 mg/dL — ABNORMAL HIGH (ref 0.57–1.00)
GFR calc Af Amer: 51 mL/min/{1.73_m2} — ABNORMAL LOW (ref >59)
GFR calc non Af Amer: 44 mL/min/{1.73_m2} — ABNORMAL LOW (ref >59)
Globulin, Total: 2.6 g/dL (ref 1.5–4.5)
Glucose: 119 mg/dL — ABNORMAL HIGH (ref 65–99)
POTASSIUM: 4.1 mmol/L (ref 3.5–5.2)
Sodium: 142 mmol/L (ref 134–144)
Total Protein: 6.6 g/dL (ref 6.0–8.5)

## 2014-09-01 LAB — HGB A1C W/O EAG: Hgb A1c MFr Bld: 8.1 % — ABNORMAL HIGH (ref 4.8–5.6)

## 2014-09-01 LAB — TSH: TSH: 4.97 u[IU]/mL — AB (ref 0.450–4.500)

## 2014-09-02 NOTE — Assessment & Plan Note (Signed)
Noted, diabetic shoe order filled out

## 2014-09-02 NOTE — Assessment & Plan Note (Signed)
She has already seen GI, had EGD, colonoscopy; will get pelvic US since ovaries intact

## 2014-09-02 NOTE — Assessment & Plan Note (Signed)
Chronic major depressive disorder; encouraged patient to contact her psychiatrist; she should not be on two SSRIs and that doctor should be aware of what meds she takes and adjust or discontinue

## 2014-09-04 DIAGNOSIS — I6529 Occlusion and stenosis of unspecified carotid artery: Secondary | ICD-10-CM | POA: Diagnosis not present

## 2014-09-04 DIAGNOSIS — R609 Edema, unspecified: Secondary | ICD-10-CM | POA: Diagnosis not present

## 2014-09-04 DIAGNOSIS — I6523 Occlusion and stenosis of bilateral carotid arteries: Secondary | ICD-10-CM | POA: Diagnosis not present

## 2014-09-04 DIAGNOSIS — I10 Essential (primary) hypertension: Secondary | ICD-10-CM | POA: Diagnosis not present

## 2014-09-04 DIAGNOSIS — J45909 Unspecified asthma, uncomplicated: Secondary | ICD-10-CM | POA: Diagnosis not present

## 2014-09-04 DIAGNOSIS — M1990 Unspecified osteoarthritis, unspecified site: Secondary | ICD-10-CM | POA: Diagnosis not present

## 2014-09-04 DIAGNOSIS — E785 Hyperlipidemia, unspecified: Secondary | ICD-10-CM | POA: Diagnosis not present

## 2014-09-04 DIAGNOSIS — E669 Obesity, unspecified: Secondary | ICD-10-CM | POA: Diagnosis not present

## 2014-09-04 DIAGNOSIS — E119 Type 2 diabetes mellitus without complications: Secondary | ICD-10-CM | POA: Diagnosis not present

## 2014-09-05 ENCOUNTER — Telehealth: Payer: Self-pay | Admitting: Family Medicine

## 2014-09-05 NOTE — Telephone Encounter (Signed)
A1C is up; previously seen by endocrinologist GFR down a bit; previously seen by Dr. Holley Raring (nephrologist) Microcytic anemia; referred to GI for this last year; was supposed to have EGD and colonoscopy  I called earlier today, home number was busy

## 2014-09-05 NOTE — Telephone Encounter (Signed)
Home number was busy

## 2014-09-07 ENCOUNTER — Other Ambulatory Visit: Payer: Self-pay | Admitting: Family Medicine

## 2014-09-08 ENCOUNTER — Other Ambulatory Visit: Payer: Self-pay

## 2014-09-08 MED ORDER — LEVOTHYROXINE SODIUM 112 MCG PO TABS: 112.0000 ug | ORAL_TABLET | ORAL | Status: AC

## 2014-09-08 NOTE — Telephone Encounter (Signed)
Routing to provider  

## 2014-09-08 NOTE — Telephone Encounter (Signed)
Left message to call.

## 2014-09-08 NOTE — Telephone Encounter (Signed)
1. Patient is seeing a psychiatrist who should be managing her antidepressants 2.  She should NOT be taking sertraline AND escitalopram; she needs to clarify with her psychiatrist which ONE to take and refill the appropriate one with that provider

## 2014-09-08 NOTE — Telephone Encounter (Signed)
I left detailed msg Thyroid dose needs adjusting; I have already sent two prescriptions to pharmacy; alternate 100 and 112 strengths A1C is up, need to watch diet more closely Anemic; may be losing blood from GI tract; I referred her to GI last year for this same issue; she was supposed to have an EGD and colonoscopy; I need to know if she followed up on that, and if not, she needs to call and reschedule her appt with them We'll try to catch up again by the end of the day

## 2014-09-12 MED ORDER — INSULIN GLARGINE 100 UNIT/ML ~~LOC~~ SOLN: 40.0000 [IU] | Freq: Every day | SUBCUTANEOUS | Status: DC

## 2014-09-12 NOTE — Telephone Encounter (Signed)
I spoke with patient She is not seeing endo She has seen GI, already had the EGD and colonoscopy; she goes up and down with her iron; increase iron intake a little Increase insulin by three units and another three units if not under 180 to 200 range She is off of the prednisone now completely after taper

## 2014-09-14 ENCOUNTER — Ambulatory Visit: Payer: Medicare Other

## 2014-09-18 ENCOUNTER — Ambulatory Visit
Admission: RE | Admit: 2014-09-18 | Discharge: 2014-09-18 | Disposition: A | Discharge status: Home / Self Care | Payer: Medicare Other | Source: Ambulatory Visit | Attending: Rheumatology | Admitting: Rheumatology

## 2014-09-18 DIAGNOSIS — M48061 Spinal stenosis, lumbar region without neurogenic claudication: Secondary | ICD-10-CM

## 2014-09-18 DIAGNOSIS — M1288 Other specific arthropathies, not elsewhere classified, other specified site: Secondary | ICD-10-CM | POA: Insufficient documentation

## 2014-09-18 DIAGNOSIS — M4806 Spinal stenosis, lumbar region: Secondary | ICD-10-CM | POA: Diagnosis present

## 2014-09-18 DIAGNOSIS — M5126 Other intervertebral disc displacement, lumbar region: Secondary | ICD-10-CM | POA: Insufficient documentation

## 2014-09-18 DIAGNOSIS — R29898 Other symptoms and signs involving the musculoskeletal system: Secondary | ICD-10-CM

## 2014-09-20 ENCOUNTER — Other Ambulatory Visit: Payer: Self-pay | Admitting: Family Medicine

## 2014-09-20 ENCOUNTER — Ambulatory Visit: Payer: Medicare Other

## 2014-09-28 ENCOUNTER — Ambulatory Visit (INDEPENDENT_AMBULATORY_CARE_PROVIDER_SITE_OTHER): Payer: Medicare Other | Admitting: Family Medicine

## 2014-09-28 ENCOUNTER — Other Ambulatory Visit: Payer: Self-pay | Admitting: Family Medicine

## 2014-09-28 ENCOUNTER — Encounter: Payer: Self-pay | Admitting: Family Medicine

## 2014-09-28 ENCOUNTER — Other Ambulatory Visit: Payer: Self-pay | Admitting: *Deleted

## 2014-09-28 VITALS — BP 138/69 | HR 64 | Temp 97.9°F | Wt 195.0 lb

## 2014-09-28 DIAGNOSIS — H0520 Unspecified exophthalmos: Secondary | ICD-10-CM | POA: Insufficient documentation

## 2014-09-28 DIAGNOSIS — R51 Headache: Secondary | ICD-10-CM | POA: Diagnosis not present

## 2014-09-28 DIAGNOSIS — E1140 Type 2 diabetes mellitus with diabetic neuropathy, unspecified: Secondary | ICD-10-CM

## 2014-09-28 DIAGNOSIS — E890 Postprocedural hypothyroidism: Secondary | ICD-10-CM

## 2014-09-28 DIAGNOSIS — H538 Other visual disturbances: Secondary | ICD-10-CM | POA: Diagnosis not present

## 2014-09-28 DIAGNOSIS — R519 Headache, unspecified: Secondary | ICD-10-CM

## 2014-09-28 LAB — C-REACTIVE PROTEIN: CRP: 9.5 mg/L — ABNORMAL HIGH (ref 0.0–4.9)

## 2014-09-28 LAB — SEDIMENTATION RATE: Sed Rate: 86 mm/hr — ABNORMAL HIGH (ref 0–40)

## 2014-09-28 MED ORDER — INSULIN GLARGINE 100 UNIT/ML ~~LOC~~ SOLN: 30.0000 [IU] | Freq: Every day | SUBCUTANEOUS | Status: DC

## 2014-09-28 MED ORDER — PROMETHAZINE HCL 12.5 MG PO TABS: 12.5000 mg | ORAL_TABLET | Freq: Four times a day (QID) | ORAL | Status: DC | PRN

## 2014-09-28 MED ORDER — GABAPENTIN 100 MG PO CAPS: ORAL_CAPSULE | ORAL | Status: DC

## 2014-09-28 MED ORDER — PREDNISONE 20 MG PO TABS: 60.0000 mg | ORAL_TABLET | Freq: Every day | ORAL | Status: DC

## 2014-09-28 MED ORDER — OXYCODONE-ACETAMINOPHEN 5-325 MG PO TABS: 1.0000 | ORAL_TABLET | Freq: Four times a day (QID) | ORAL | Status: DC | PRN

## 2014-09-28 NOTE — Patient Instructions (Addendum)
Decrease long-acting insulin from forty units a day to thirty units a day We'll get stat labs today and refer you to an eye specialist Start gabapentin, be careful to not fall We'll contact you at [phone] with the lab results Return on Monday for follow-up Do see an endocrinologist about your thyroid and your diabetes

## 2014-09-28 NOTE — Assessment & Plan Note (Signed)
Order MRI orbits with and without

## 2014-09-28 NOTE — Assessment & Plan Note (Signed)
Refer back to endo

## 2014-09-28 NOTE — Progress Notes (Signed)
BP 138/69 mmHg  Pulse 64  Temp(Src) 97.9 F (36.6 C)  Wt 195 lb (88.451 kg)  SpO2 96%   Subjective:    Patient ID: Bonnie Henry, female    DOB: February 10, 1940, 74 y.o.   MRN: [id number]  HPI: Bonnie Henry is a 74 y.o. female  Chief Complaint  Patient presents with  . OTHER    "I've got stabbing pains going up and down my body". It has been worse the past week.   Marland Kitchen Head Pain    stabbing pain going across her head. Not like a headache. When it happens, daughter states that mom grabs her head when it happens. This has been going on for a couple of weeks.    She has been getting bad pains in the head; starts right on the right temple; either goes across forehead or back along the scalp to the right  Eyes are getting worse; trouble reading unless she has her glasses on; one far-sighted and one near-sighted; last visit to eye doctor was two years ago; cataracts removed 4 years ago  No fevers  Pain in the neck, really bad; she saw rheumatologist; the other day in tears; they are sending her to a pain clinic; she is really suffering; the rheumatologist is not giving her anything for pain; she can't take hydrocodone; tramadol helped some but not helping any more; she has used percocet but it caused her to feel bad; head laying on rocks; DDD in the back; tried Lyrica, not taking anything right now  Her appetite has decreased, her weight is coming down; blood sugars are coming down; lowest blood sugar 49 in the last week; more frequently  Dizzy when head is down and coming back up, dizzy when bending forward  Relevant past medical, surgical, family and social history reviewed and updated as indicated. Interim medical history since our last visit reviewed. Allergies and medications reviewed and updated.  Review of Systems Per HPI unless specifically indicated above     Objective:    BP 138/69 mmHg  Pulse 64  Temp(Src) 97.9 F (36.6 C)  Wt 195 lb (88.451 kg)  SpO2 96%   Wt Readings from Last 3 Encounters:  09/29/14 195 lb (88.451 kg)  09/28/14 195 lb (88.451 kg)  08/31/14 202 lb (91.627 kg)    Physical Exam  Constitutional: She appears well-developed and well-nourished. No distress.  HENT:  Head: Normocephalic and atraumatic.  Right Ear: Hearing normal.  Left Ear: Hearing normal.  Mouth/Throat: Oropharynx is clear and moist. Mucous membranes are not pale and dry (slightly tachy).  Tender to palpation across the right temporal region  Eyes: EOM are normal. Right conjunctiva is not injected. Left conjunctiva is not injected. No scleral icterus. Pupils are equal.  Asymmetry noted, right eye protrudes more than left; tender to palpation over the upper lid  Neck: No thyromegaly present.  Cardiovascular: Normal rate and normal heart sounds.   No murmur heard. Pulmonary/Chest: Effort normal. No respiratory distress. She has no wheezes.  Abdominal: Soft. Bowel sounds are normal. She exhibits no distension.  Musculoskeletal: Normal range of motion. She exhibits no edema.  Neurological: She is alert. She exhibits normal muscle tone.  Skin: Skin is warm and dry. She is not diaphoretic. No pallor.  Psychiatric: She has a normal mood and affect. Her behavior is normal. Judgment and thought content normal.   Results for orders placed or performed in visit on 09/28/14  Sed Rate (ESR)  Result Value Ref Range  Sed Rate 86 (H) 0 - 40 mm/hr  C-reactive protein  Result Value Ref Range   CRP 9.5 (H) 0.0 - 4.9 mg/L      Assessment & Plan:   Problem List Items Addressed This Visit      Endocrine   Adult hypothyroidism    Refer to endo      Diabetes mellitus with diabetic neuropathy    Refer back to endo      Relevant Medications   insulin glargine (LANTUS) 100 UNIT/ML injection     Other   Vision blurred    Refer to ophthalmologist; MRI orbits;       Relevant Orders   Ambulatory referral to Ophthalmology   MR Orbits W/Ccm   Sed Rate (ESR)  (Completed)   C-reactive protein (Completed)   Acquired exophthalmos    Refer to ophthalmologist and get MRI orbits      Relevant Orders   Ambulatory referral to Ophthalmology   MR Orbits W/Ccm   Sed Rate (ESR) (Completed)   C-reactive protein (Completed)   Ambulatory referral to Endocrinology   Unilateral headache - Primary    Concerning for possible temporal arteritis; will have her get stat sed rate and see vascular surgeon for biopsy if positive (elevated); return Monday if normal for further work-up, differential      Relevant Orders   Ambulatory referral to Ophthalmology   MR Orbits W/Ccm   Sed Rate (ESR) (Completed)   C-reactive protein (Completed)      Follow up plan: No Follow-up on file.  An after-visit summary was printed and given to the patient at [facility].  Please see the patient instructions which may contain other information and recommendations beyond what is mentioned above in the assessment and plan.  Meds ordered this encounter  Medications  . insulin glargine (LANTUS) 100 UNIT/ML injection    Sig: Inject 0.3 mLs (30 Units total) into the skin daily.    Dispense:  1 vial    Refill:  3    New instructions  . gabapentin (NEURONTIN) 100 MG capsule    Sig: One at bedtime x 3 days, then one twice a day x 3 days, then three times a day    Dispense:  82 capsule    Refill:  0  . oxyCODONE-acetaminophen (PERCOCET/ROXICET) 5-325 MG per tablet    Sig: Take 1 tablet by mouth every 6 (six) hours as needed for severe pain.    Dispense:  30 tablet    Refill:  0  . promethazine (PHENERGAN) 12.5 MG tablet    Sig: Take 1 tablet (12.5 mg total) by mouth every 6 (six) hours as needed for nausea or vomiting.    Dispense:  30 tablet    Refill:  0  . predniSONE (DELTASONE) 20 MG tablet    Sig: Take 3 tablets (60 mg total) by mouth daily with breakfast. Do NOT stop abruptly; taper per doctor's instructions    Dispense:  90 tablet    Refill:  0   (prednisone sent in  after stat labs resulted)

## 2014-09-28 NOTE — Assessment & Plan Note (Signed)
Refer to endo 

## 2014-09-29 ENCOUNTER — Ambulatory Visit (HOSPITAL_COMMUNITY): Payer: Medicare Other | Admitting: Critical Care Medicine

## 2014-09-29 ENCOUNTER — Ambulatory Visit (HOSPITAL_COMMUNITY)
Admission: RE | Admit: 2014-09-29 | Discharge: 2014-09-29 | Disposition: A | Discharge status: Home / Self Care | Payer: Medicare Other | Source: Ambulatory Visit | Attending: Vascular Surgery | Admitting: Vascular Surgery

## 2014-09-29 ENCOUNTER — Encounter (HOSPITAL_COMMUNITY)
Admission: RE | Disposition: A | Discharge status: Home / Self Care | Payer: Self-pay | Source: Ambulatory Visit | Attending: Vascular Surgery

## 2014-09-29 ENCOUNTER — Encounter (HOSPITAL_COMMUNITY): Payer: Self-pay | Admitting: *Deleted

## 2014-09-29 DIAGNOSIS — E890 Postprocedural hypothyroidism: Secondary | ICD-10-CM | POA: Diagnosis not present

## 2014-09-29 DIAGNOSIS — J449 Chronic obstructive pulmonary disease, unspecified: Secondary | ICD-10-CM | POA: Diagnosis not present

## 2014-09-29 DIAGNOSIS — G8929 Other chronic pain: Secondary | ICD-10-CM | POA: Diagnosis not present

## 2014-09-29 DIAGNOSIS — G4730 Sleep apnea, unspecified: Secondary | ICD-10-CM | POA: Insufficient documentation

## 2014-09-29 DIAGNOSIS — K219 Gastro-esophageal reflux disease without esophagitis: Secondary | ICD-10-CM | POA: Insufficient documentation

## 2014-09-29 DIAGNOSIS — I10 Essential (primary) hypertension: Secondary | ICD-10-CM | POA: Diagnosis not present

## 2014-09-29 DIAGNOSIS — F419 Anxiety disorder, unspecified: Secondary | ICD-10-CM | POA: Insufficient documentation

## 2014-09-29 DIAGNOSIS — E119 Type 2 diabetes mellitus without complications: Secondary | ICD-10-CM | POA: Insufficient documentation

## 2014-09-29 DIAGNOSIS — Z87891 Personal history of nicotine dependence: Secondary | ICD-10-CM | POA: Diagnosis not present

## 2014-09-29 DIAGNOSIS — I2510 Atherosclerotic heart disease of native coronary artery without angina pectoris: Secondary | ICD-10-CM | POA: Insufficient documentation

## 2014-09-29 DIAGNOSIS — J45909 Unspecified asthma, uncomplicated: Secondary | ICD-10-CM | POA: Diagnosis not present

## 2014-09-29 DIAGNOSIS — R700 Elevated erythrocyte sedimentation rate: Secondary | ICD-10-CM | POA: Diagnosis not present

## 2014-09-29 DIAGNOSIS — E780 Pure hypercholesterolemia: Secondary | ICD-10-CM | POA: Insufficient documentation

## 2014-09-29 DIAGNOSIS — Z888 Allergy status to other drugs, medicaments and biological substances status: Secondary | ICD-10-CM | POA: Insufficient documentation

## 2014-09-29 DIAGNOSIS — I252 Old myocardial infarction: Secondary | ICD-10-CM | POA: Diagnosis not present

## 2014-09-29 DIAGNOSIS — Z6838 Body mass index (BMI) 38.0-38.9, adult: Secondary | ICD-10-CM | POA: Diagnosis not present

## 2014-09-29 DIAGNOSIS — I739 Peripheral vascular disease, unspecified: Secondary | ICD-10-CM | POA: Diagnosis not present

## 2014-09-29 DIAGNOSIS — R51 Headache: Secondary | ICD-10-CM | POA: Diagnosis not present

## 2014-09-29 DIAGNOSIS — M549 Dorsalgia, unspecified: Secondary | ICD-10-CM | POA: Insufficient documentation

## 2014-09-29 DIAGNOSIS — Z9012 Acquired absence of left breast and nipple: Secondary | ICD-10-CM | POA: Insufficient documentation

## 2014-09-29 DIAGNOSIS — H538 Other visual disturbances: Secondary | ICD-10-CM | POA: Diagnosis not present

## 2014-09-29 DIAGNOSIS — Z885 Allergy status to narcotic agent status: Secondary | ICD-10-CM | POA: Diagnosis not present

## 2014-09-29 DIAGNOSIS — F329 Major depressive disorder, single episode, unspecified: Secondary | ICD-10-CM | POA: Diagnosis not present

## 2014-09-29 DIAGNOSIS — D649 Anemia, unspecified: Secondary | ICD-10-CM | POA: Diagnosis not present

## 2014-09-29 DIAGNOSIS — Z853 Personal history of malignant neoplasm of breast: Secondary | ICD-10-CM | POA: Insufficient documentation

## 2014-09-29 HISTORY — DX: Headache, unspecified: R51.9

## 2014-09-29 HISTORY — DX: Headache: R51

## 2014-09-29 HISTORY — PX: ARTERY BIOPSY: SHX891

## 2014-09-29 HISTORY — DX: Personal history of other diseases of the digestive system: Z87.19

## 2014-09-29 LAB — GLUCOSE, CAPILLARY
Glucose-Capillary: 125 mg/dL — ABNORMAL HIGH (ref 65–99)
Glucose-Capillary: 125 mg/dL — ABNORMAL HIGH (ref 65–99)
Glucose-Capillary: 127 mg/dL — ABNORMAL HIGH (ref 65–99)

## 2014-09-29 LAB — SURGICAL PCR SCREEN
MRSA, PCR: NEGATIVE
Staphylococcus aureus: NEGATIVE

## 2014-09-29 LAB — POCT I-STAT 4, (NA,K, GLUC, HGB,HCT)
GLUCOSE: 135 mg/dL — AB (ref 65–99)
HEMATOCRIT: 30 % — AB (ref 36.0–46.0)
HEMOGLOBIN: 10.2 g/dL — AB (ref 12.0–15.0)
POTASSIUM: 4.3 mmol/L (ref 3.5–5.1)
SODIUM: 142 mmol/L (ref 135–145)

## 2014-09-29 SURGERY — BIOPSY TEMPORAL ARTERY
Anesthesia: Monitor Anesthesia Care | Site: Face | Laterality: Right

## 2014-09-29 MED ORDER — DEXTROSE 5 % IV SOLN
1.5000 g | INTRAVENOUS | Status: AC
  Administered 2014-09-29: 1.5 g via INTRAVENOUS
  Filled 2014-09-29: qty 1.5

## 2014-09-29 MED ORDER — MUPIROCIN 2 % EX OINT
1.0000 "application " | TOPICAL_OINTMENT | Freq: Once | CUTANEOUS | Status: AC
  Administered 2014-09-29: 1 via TOPICAL

## 2014-09-29 MED ORDER — SODIUM CHLORIDE 0.9 % IV SOLN: INTRAVENOUS | Status: DC

## 2014-09-29 MED ORDER — MIDAZOLAM HCL 5 MG/5ML IJ SOLN
INTRAMUSCULAR | Status: DC | PRN
  Administered 2014-09-29: 2 mg via INTRAVENOUS

## 2014-09-29 MED ORDER — 0.9 % SODIUM CHLORIDE (POUR BTL) OPTIME
TOPICAL | Status: DC | PRN
  Administered 2014-09-29: 1000 mL

## 2014-09-29 MED ORDER — MUPIROCIN 2 % EX OINT
TOPICAL_OINTMENT | CUTANEOUS | Status: AC
  Administered 2014-09-29: 1 via TOPICAL
  Filled 2014-09-29: qty 22

## 2014-09-29 MED ORDER — PROPOFOL 10 MG/ML IV BOLUS
INTRAVENOUS | Status: DC | PRN
  Administered 2014-09-29 (×3): 10 mg via INTRAVENOUS

## 2014-09-29 MED ORDER — CALCIUM CHLORIDE 10 % IV SOLN
INTRAVENOUS | Status: AC
  Filled 2014-09-29: qty 10

## 2014-09-29 MED ORDER — MIDAZOLAM HCL 2 MG/2ML IJ SOLN
INTRAMUSCULAR | Status: AC
  Filled 2014-09-29: qty 2

## 2014-09-29 MED ORDER — SODIUM CHLORIDE 0.9 % IJ SOLN
INTRAMUSCULAR | Status: AC
  Filled 2014-09-29: qty 10

## 2014-09-29 MED ORDER — FENTANYL CITRATE (PF) 100 MCG/2ML IJ SOLN
INTRAMUSCULAR | Status: DC | PRN
  Administered 2014-09-29 (×3): 25 ug via INTRAVENOUS

## 2014-09-29 MED ORDER — PROPOFOL 10 MG/ML IV BOLUS
INTRAVENOUS | Status: AC
  Filled 2014-09-29: qty 20

## 2014-09-29 MED ORDER — LACTATED RINGERS IV SOLN
INTRAVENOUS | Status: DC
  Administered 2014-09-29: 09:00:00 via INTRAVENOUS

## 2014-09-29 MED ORDER — EPHEDRINE SULFATE 50 MG/ML IJ SOLN
INTRAMUSCULAR | Status: AC
  Filled 2014-09-29: qty 1

## 2014-09-29 MED ORDER — ONDANSETRON HCL 4 MG/2ML IJ SOLN
INTRAMUSCULAR | Status: DC | PRN
  Administered 2014-09-29: 4 mg via INTRAVENOUS

## 2014-09-29 MED ORDER — FENTANYL CITRATE (PF) 250 MCG/5ML IJ SOLN
INTRAMUSCULAR | Status: AC
  Filled 2014-09-29: qty 5

## 2014-09-29 MED ORDER — LIDOCAINE HCL (PF) 1 % IJ SOLN
INTRAMUSCULAR | Status: DC | PRN
  Administered 2014-09-29: 30 mL

## 2014-09-29 MED ORDER — LIDOCAINE-EPINEPHRINE (PF) 1 %-1:200000 IJ SOLN
INTRAMUSCULAR | Status: AC
  Filled 2014-09-29: qty 30

## 2014-09-29 SURGICAL SUPPLY — 31 items
CANISTER SUCTION 2500CC (MISCELLANEOUS) ×3 IMPLANT
CONT SPEC 4OZ CLIKSEAL STRL BL (MISCELLANEOUS) ×3 IMPLANT
COTTONBALL LRG STERILE PKG (GAUZE/BANDAGES/DRESSINGS) ×3 IMPLANT
DECANTER SPIKE VIAL GLASS SM (MISCELLANEOUS) ×3 IMPLANT
DRAPE OPHTHALMIC 77X100 STRL (CUSTOM PROCEDURE TRAY) ×3 IMPLANT
ELECT NEEDLE TIP 2.8 STRL (NEEDLE) ×3 IMPLANT
ELECT REM PT RETURN 9FT ADLT (ELECTROSURGICAL) ×3
ELECTRODE REM PT RTRN 9FT ADLT (ELECTROSURGICAL) ×1 IMPLANT
GAUZE SPONGE 2X2 8PLY STRL LF (GAUZE/BANDAGES/DRESSINGS) ×1 IMPLANT
GEL ULTRASOUND 20GR AQUASONIC (MISCELLANEOUS) ×3 IMPLANT
GLOVE BIO SURGEON STRL SZ7.5 (GLOVE) ×3 IMPLANT
GLOVE BIOGEL PI IND STRL 8 (GLOVE) ×1 IMPLANT
GLOVE BIOGEL PI INDICATOR 8 (GLOVE) ×2
GOWN STRL REUS W/ TWL LRG LVL3 (GOWN DISPOSABLE) ×3 IMPLANT
GOWN STRL REUS W/TWL LRG LVL3 (GOWN DISPOSABLE) ×6
KIT BASIN OR (CUSTOM PROCEDURE TRAY) ×3 IMPLANT
KIT ROOM TURNOVER OR (KITS) ×3 IMPLANT
LIQUID BAND (GAUZE/BANDAGES/DRESSINGS) ×3 IMPLANT
NEEDLE HYPO 25GX1X1/2 BEV (NEEDLE) ×3 IMPLANT
NS IRRIG 1000ML POUR BTL (IV SOLUTION) ×3 IMPLANT
PACK GENERAL/GYN (CUSTOM PROCEDURE TRAY) ×3 IMPLANT
PAD ARMBOARD 7.5X6 YLW CONV (MISCELLANEOUS) ×6 IMPLANT
SPONGE GAUZE 2X2 STER 10/PKG (GAUZE/BANDAGES/DRESSINGS) ×2
SUT PROLENE 6 0 BV (SUTURE) ×3 IMPLANT
SUT SILK 3 0 (SUTURE) ×2
SUT SILK 3-0 18XBRD TIE 12 (SUTURE) ×1 IMPLANT
SUT VIC AB 3-0 SH 27 (SUTURE) ×2
SUT VIC AB 3-0 SH 27X BRD (SUTURE) ×1 IMPLANT
SUT VICRYL 4-0 PS2 18IN ABS (SUTURE) ×3 IMPLANT
SYR CONTROL 10ML LL (SYRINGE) ×3 IMPLANT
WATER STERILE IRR 1000ML POUR (IV SOLUTION) ×3 IMPLANT

## 2014-09-29 NOTE — H&P (Signed)
[facility]  Patient name: Bonnie Henry MRN: [id number] DOB: 1940-10-26 Sex: female  REASON FOR CONSULT: Possible temporal arteritis  HPI: Bonnie Henry is a 74 y.o. female who has been having headaches for proximally a month. Her symptoms started in the right side she had some pain on the right side of her head and persistent dull headaches on the top of her head. She had an elevated sedimentation rate as part of her workup and I was asked to perform a right temporal artery biopsy.  Past Medical History  Diagnosis Date  . Hypertension   . GERD (gastroesophageal reflux disease)   . CAD (coronary artery disease)   . Hypercholesteremia   . COPD (chronic obstructive pulmonary disease)   . Morbid obesity   . Post-surgical hypothyroidism   . Depression, major   . Neuropathy   . Asthma   . Sleep apnea     "2015 wore mask; lost weight; told didn't need mask anymore" (02/23/2014)  . Type II diabetes mellitus   . Anxiety   . Anemia   . History of blood transfusion     "when I had my mastectomy"  . Arthritis     "joints ache all over"   . Chronic back pain   . Breast cancer, left breast     S/P mastectomy  . Myocardial infarction   . Shortness of breath dyspnea   . Heart murmur   . Headache   . History of hiatal hernia     Family History  Problem Relation Age of Onset  . Arthritis Mother   . Cancer Mother   . Diabetes Mother   . Heart disease Mother   . Hyperlipidemia Mother   . Hypertension Mother   . Alcohol abuse Father   . Alcohol abuse Brother   . Arthritis Brother   . Asthma Brother   . HIV Brother   . Cancer Brother   . Diabetes Brother   . Hyperlipidemia Brother   . Hypertension Brother   . Lung disease Brother   . Arthritis Maternal Grandmother   . Brain cancer Father     SOCIAL HISTORY: Social History  Substance Use Topics  . Smoking status: Former Smoker -- 1.50 packs/day for 44 years    Types:  Cigarettes    Quit date: 01/06/1994  . Smokeless tobacco: Never Used     Comment: "quit smoking cigarettes in ~ 2000"  . Alcohol Use: No     Comment: 02/23/2014 "no alcohol since 1990's"    Allergies  Allergen Reactions  . Enalapril Other (See Comments)    Angioedema  . Codeine Nausea And Vomiting  . Morphine And Related Nausea And Vomiting    Current Facility-Administered Medications  Medication Dose Route Frequency Provider Last Rate Last Dose  . 0.9 %  sodium chloride infusion   Intravenous Continuous Angelia Mould, MD      . cefUROXime (ZINACEF) 1.5 g in dextrose 5 % 50 mL IVPB  1.5 g Intravenous 30 min Pre-Op Angelia Mould, MD      . lactated ringers infusion   Intravenous Continuous Roberts Gaudy, MD 10 mL/hr at 09/29/14 0831      REVIEW OF SYSTEMS: Valu.Nieves ] denotes positive finding; [  ] denotes negative finding CARDIOVASCULAR:  [ ]  chest pain   [ ]  chest pressure   [ ]  palpitations   [ ]  orthopnea   [ ]  dyspnea on exertion   [ ]   claudication   [ ]  rest pain   [ ]  DVT   [ ]  phlebitis PULMONARY:   [ ]  productive cough   [ ]  asthma   [ ]  wheezing NEUROLOGIC:   [ ]  weakness  [ ]  paresthesias  [ ]  aphasia  [ ]  amaurosis  [ ]  dizziness HEMATOLOGIC:   [ ]  bleeding problems   [ ]  clotting disorders MUSCULOSKELETAL:  [ ]  joint pain   [ ]  joint swelling [ ]  leg swelling GASTROINTESTINAL: [ ]   blood in stool  [ ]   hematemesis GENITOURINARY:  [ ]   dysuria  [ ]   hematuria PSYCHIATRIC:  [ ]  history of major depression INTEGUMENTARY:  [ ]  rashes  [ ]  ulcers CONSTITUTIONAL:  [ ]  fever   [ ]  chills  PHYSICAL EXAM: Filed Vitals:   09/29/14 0725  BP: 166/59  Pulse: 69  Temp: 97.8 F (36.6 C)  TempSrc: Oral  Resp: 20  Height: 5' (1.524 m)  Weight: 195 lb (88.451 kg)  SpO2: 95%   Body mass index is 38.08 kg/(m^2). GENERAL: The patient is a well-nourished female, in no acute distress. The vital signs are documented above. CARDIAC: There is a regular rate and rhythm.    VASCULAR: Palpable radial pulses PULMONARY: There is good air exchange bilaterally without wheezing or rales. ABDOMEN: Soft and non-tender with normal pitched bowel sounds.  MUSCULOSKELETAL: There are no major deformities. NEUROLOGIC: No focal weakness or paresthesias are detected. SKIN: There are no ulcers or rashes noted. PSYCHIATRIC: The patient has a normal affect.  DATA:  Lab Results  Component Value Date   WBC 8.0 08/31/2014   HGB 10.2* 09/29/2014   HCT 30.0* 09/29/2014   MCV 82.4 05/21/2014   PLT 214 05/21/2014   Lab Results  Component Value Date   NA 142 09/29/2014   K 4.3 09/29/2014   CL 96* 08/31/2014   CO2 27 08/31/2014   Lab Results  Component Value Date   CREATININE 1.22* 08/31/2014   Lab Results  Component Value Date   INR 0.99 02/24/2014   Lab Results  Component Value Date   HGBA1C 8.1* 08/31/2014   CBG (last 3)   Recent Labs  09/29/14 0722 09/29/14 0920  GLUCAP 125* 125*    MEDICAL ISSUES: POSSIBLE TEMPORAL ARTERITIS: I will proceed with right temporal artery biopsy. I have discussed the procedure potential complications and she is agreeable to proceed.  Deitra Mayo [facility]: [phone]

## 2014-09-29 NOTE — Anesthesia Procedure Notes (Signed)
Procedure Name: MAC Date/Time: 09/29/2014 11:22 AM Performed by: Merrilyn Puma B Pre-anesthesia Checklist: Patient identified, Timeout performed, Emergency Drugs available, Suction available and Patient being monitored Patient Re-evaluated:Patient Re-evaluated prior to inductionOxygen Delivery Method: Nasal cannula Preoxygenation: Pre-oxygenation with 100% oxygen Intubation Type: IV induction Ventilation: Mask ventilation without difficulty Dental Injury: Teeth and Oropharynx as per pre-operative assessment

## 2014-09-29 NOTE — Progress Notes (Signed)
Dr. Linna Caprice contacted, told of Patient low bp.  Ordered to monitor, if BP remains above 90 systolic, patient may be discharged.

## 2014-09-29 NOTE — Transfer of Care (Signed)
Immediate Anesthesia Transfer of Care Note  Patient: Bonnie Henry  Procedure(s) Performed: Procedure(s): BIOPSY TEMPORAL ARTERY (Right)  Patient Location: PACU  Anesthesia Type:MAC  Level of Consciousness: awake, oriented and patient cooperative  Airway & Oxygen Therapy: Patient Spontanous Breathing  Post-op Assessment: Report given to RN, Post -op Vital signs reviewed and stable and Patient moving all extremities  Post vital signs: Reviewed and stable  Last Vitals:  Filed Vitals:   09/29/14 0725  BP: 166/59  Pulse: 69  Temp: 36.6 C  Resp: 20    Complications: No apparent anesthesia complications

## 2014-09-29 NOTE — Op Note (Signed)
    NAME: Bonnie Henry  MRN: [id number] DOB: 1940-06-25    DATE OF OPERATION: 09/29/2014  PREOP DIAGNOSIS: Rule out temporal arteritis  POSTOP DIAGNOSIS: same  PROCEDURE: right temporal artery biopsy  SURGEON: Judeth Cornfield. Scot Dock, MD, FACS  ASSIST: none  ANESTHESIA: local with sedation   EBL: minimal  INDICATIONS: Bonnie Henry is a 74 y.o. female who presented with headaches and was found to have an elevated sedimentation rate. We were asked to perform a right temporal artery biopsy.  FINDINGS: 3 cm segment of temporal artery sent to pathology.  TECHNIQUE: The patient was taken to the operating room and sedated by anesthesia. The right temporal area was prepped and draped in usual sterile fashion. After the skin was anesthetized with 1% lidocaine, an incision was made over the temporal artery and the dissection carried down to the artery which was dissected free and controlled with a silk tie. Approximately 3 cm segment was dissected free and clipped at both ends. The specimen was removed and sent to pathology. Hemostasis was obtained in the wound. The wound was closed with interrupted 30 Vicryls. The skin was closed with 40 subcutaneous Vicryl stitch.Liquiband was applied. The patient tolerated the procedure well and transferred to the recovery room in stable condition. All needle and sponge counts were correct.  Deitra Mayo, MD, FACS [facility]  DATE OF DICTATION:   09/29/2014

## 2014-09-29 NOTE — Anesthesia Preprocedure Evaluation (Signed)
Anesthesia Evaluation  Patient identified by MRN, date of birth, ID band Patient awake    Reviewed: Allergy & Precautions, NPO status , Patient's Chart, lab work & pertinent test results  Airway Mallampati: III  TM Distance: >3 FB Neck ROM: Full    Dental  (+) Edentulous Upper, Edentulous Lower   Pulmonary shortness of breath, asthma , sleep apnea , COPD, former smoker,    breath sounds clear to auscultation       Cardiovascular hypertension, Pt. on medications + CAD, + Past MI and + Peripheral Vascular Disease   Rhythm:Regular Rate:Normal     Neuro/Psych  Headaches, Anxiety Depression    GI/Hepatic hiatal hernia, GERD  ,  Endo/Other  diabetes, Type 2Hypothyroidism Morbid obesity  Renal/GU      Musculoskeletal  (+) Arthritis ,   Abdominal   Peds  Hematology  (+) anemia ,   Anesthesia Other Findings   Reproductive/Obstetrics                             Anesthesia Physical Anesthesia Plan  ASA: III  Anesthesia Plan: MAC   Post-op Pain Management:    Induction: Intravenous  Airway Management Planned: Nasal Cannula  Additional Equipment:   Intra-op Plan:   Post-operative Plan:   Informed Consent: I have reviewed the patients History and Physical, chart, labs and discussed the procedure including the risks, benefits and alternatives for the proposed anesthesia with the patient or authorized representative who has indicated his/her understanding and acceptance.   Dental advisory given  Plan Discussed with: Anesthesiologist and Surgeon  Anesthesia Plan Comments:         Anesthesia Quick Evaluation

## 2014-09-29 NOTE — Anesthesia Postprocedure Evaluation (Signed)
  Anesthesia Post-op Note  Patient: Bonnie Henry  Procedure(s) Performed: Procedure(s): BIOPSY TEMPORAL ARTERY (Right)  Patient Location: PACU  Anesthesia Type: MAC  Level of Consciousness: awake, alert  and oriented  Airway and Oxygen Therapy: Patient Spontanous Breathing  Post-op Pain: none  Post-op Assessment: Post-op Vital signs reviewed, Patient's Cardiovascular Status Stable, Respiratory Function Stable, Patent Airway and Pain level controlled              Post-op Vital Signs: stable  Last Vitals:  Filed Vitals:   09/29/14 1305  BP: 115/51  Pulse: 58  Temp:   Resp: 15    Complications: No apparent anesthesia complications

## 2014-10-02 ENCOUNTER — Encounter (HOSPITAL_COMMUNITY): Payer: Self-pay | Admitting: Vascular Surgery

## 2014-10-02 DIAGNOSIS — R519 Headache, unspecified: Secondary | ICD-10-CM | POA: Insufficient documentation

## 2014-10-02 DIAGNOSIS — R51 Headache: Principal | ICD-10-CM

## 2014-10-02 DIAGNOSIS — F331 Major depressive disorder, recurrent, moderate: Secondary | ICD-10-CM | POA: Diagnosis not present

## 2014-10-02 NOTE — Assessment & Plan Note (Signed)
Refer to ophthalmologist and get MRI orbits

## 2014-10-02 NOTE — Assessment & Plan Note (Signed)
Refer to ophthalmologist; MRI orbits;

## 2014-10-02 NOTE — Assessment & Plan Note (Signed)
Concerning for possible temporal arteritis; will have her get stat sed rate and see vascular surgeon for biopsy if positive (elevated); return Monday if normal for further work-up, differential

## 2014-10-10 ENCOUNTER — Telehealth: Payer: Self-pay | Admitting: Family Medicine

## 2014-10-10 NOTE — Telephone Encounter (Signed)
Pt called requesting results from her biopsy. Please call her ASAP. Thanks.

## 2014-10-11 NOTE — Telephone Encounter (Signed)
The results are under labs->surgical pathology. It looks negative, but i wanted to check with you first.

## 2014-10-12 NOTE — Telephone Encounter (Signed)
Left message to call.

## 2014-10-12 NOTE — Telephone Encounter (Signed)
Please let patient know that her biopsy was NOT temporal arteritis; great news, obviously It does not explain the very high sed rate she had and the problems she is having with her eye and pain all over I still want her to see rheumatologist, get the MRI of her orbit / eye, and see the ophthalmologist Back the prednisone down to 50 mg daily x 2 days, then 40 mg daily x 2 days, then 30 mg daily and hold there until she gets more instructions from rheum (she should be seeing him in the next few days anyway, I believe) Do NOT stop the prednisone abruptly Appt back to see me in 2 weeks once she has seen rheum, eye doctor, and had MRI of eye

## 2014-10-13 NOTE — Telephone Encounter (Signed)
Patient notified

## 2014-10-17 ENCOUNTER — Telehealth: Payer: Self-pay | Admitting: Family Medicine

## 2014-10-17 DIAGNOSIS — R1310 Dysphagia, unspecified: Secondary | ICD-10-CM

## 2014-10-17 NOTE — Telephone Encounter (Signed)
Pt is choking on her food and feels she needs a gi referral.

## 2014-10-18 DIAGNOSIS — R1310 Dysphagia, unspecified: Secondary | ICD-10-CM | POA: Insufficient documentation

## 2014-10-18 NOTE — Telephone Encounter (Signed)
Patient notified

## 2014-10-18 NOTE — Telephone Encounter (Signed)
Routing to provider  

## 2014-10-18 NOTE — Telephone Encounter (Signed)
Referral entered to GI Will also get speech therapy eval right away for recommendations Let her know our plan, and that in the meantime, avoid straws, chew food thoroughly, don't rush when eating, avoid large pieces of hard bread and meat; opt for softer foods, easier to chew

## 2014-10-19 ENCOUNTER — Telehealth: Payer: Self-pay

## 2014-10-19 NOTE — Telephone Encounter (Signed)
I spoke with speech therapy and they stated that since she is choking on food, she would need to see GI first and then if GI felt it was necessary, then she could be referred to speech therapy.

## 2014-10-20 ENCOUNTER — Other Ambulatory Visit: Payer: Self-pay | Admitting: Family Medicine

## 2014-10-20 NOTE — Telephone Encounter (Signed)
Routing to provider  

## 2014-10-23 ENCOUNTER — Emergency Department (HOSPITAL_COMMUNITY): Payer: Medicare Other

## 2014-10-23 ENCOUNTER — Encounter (HOSPITAL_COMMUNITY): Payer: Self-pay | Admitting: Emergency Medicine

## 2014-10-23 ENCOUNTER — Emergency Department (HOSPITAL_COMMUNITY)
Admission: EM | Admit: 2014-10-23 | Discharge: 2014-10-23 | Disposition: A | Discharge status: Home / Self Care | Payer: Medicare Other | Attending: Emergency Medicine | Admitting: Emergency Medicine

## 2014-10-23 DIAGNOSIS — K219 Gastro-esophageal reflux disease without esophagitis: Secondary | ICD-10-CM | POA: Insufficient documentation

## 2014-10-23 DIAGNOSIS — R112 Nausea with vomiting, unspecified: Secondary | ICD-10-CM | POA: Diagnosis not present

## 2014-10-23 DIAGNOSIS — Z79899 Other long term (current) drug therapy: Secondary | ICD-10-CM | POA: Insufficient documentation

## 2014-10-23 DIAGNOSIS — J441 Chronic obstructive pulmonary disease with (acute) exacerbation: Secondary | ICD-10-CM | POA: Diagnosis not present

## 2014-10-23 DIAGNOSIS — Z7951 Long term (current) use of inhaled steroids: Secondary | ICD-10-CM | POA: Insufficient documentation

## 2014-10-23 DIAGNOSIS — R519 Headache, unspecified: Secondary | ICD-10-CM

## 2014-10-23 DIAGNOSIS — S0990XA Unspecified injury of head, initial encounter: Secondary | ICD-10-CM | POA: Diagnosis not present

## 2014-10-23 DIAGNOSIS — R339 Retention of urine, unspecified: Secondary | ICD-10-CM | POA: Insufficient documentation

## 2014-10-23 DIAGNOSIS — Z87891 Personal history of nicotine dependence: Secondary | ICD-10-CM | POA: Diagnosis not present

## 2014-10-23 DIAGNOSIS — E119 Type 2 diabetes mellitus without complications: Secondary | ICD-10-CM | POA: Diagnosis not present

## 2014-10-23 DIAGNOSIS — J449 Chronic obstructive pulmonary disease, unspecified: Secondary | ICD-10-CM | POA: Diagnosis not present

## 2014-10-23 DIAGNOSIS — R0602 Shortness of breath: Secondary | ICD-10-CM | POA: Diagnosis present

## 2014-10-23 DIAGNOSIS — R51 Headache: Secondary | ICD-10-CM | POA: Insufficient documentation

## 2014-10-23 DIAGNOSIS — R069 Unspecified abnormalities of breathing: Secondary | ICD-10-CM | POA: Diagnosis not present

## 2014-10-23 DIAGNOSIS — I10 Essential (primary) hypertension: Secondary | ICD-10-CM | POA: Insufficient documentation

## 2014-10-23 DIAGNOSIS — I4891 Unspecified atrial fibrillation: Secondary | ICD-10-CM | POA: Insufficient documentation

## 2014-10-23 HISTORY — DX: Unspecified atrial fibrillation: I48.91

## 2014-10-23 HISTORY — DX: Type 2 diabetes mellitus without complications: E11.9

## 2014-10-23 LAB — URINALYSIS, ROUTINE W REFLEX MICROSCOPIC
BILIRUBIN URINE: NEGATIVE
Glucose, UA: NEGATIVE mg/dL
Hgb urine dipstick: NEGATIVE
KETONES UR: NEGATIVE mg/dL
Leukocytes, UA: NEGATIVE
NITRITE: NEGATIVE
PROTEIN: NEGATIVE mg/dL
Specific Gravity, Urine: 1.007 (ref 1.005–1.030)
UROBILINOGEN UA: 0.2 mg/dL (ref 0.0–1.0)
pH: 6 (ref 5.0–8.0)

## 2014-10-23 LAB — BASIC METABOLIC PANEL
ANION GAP: 12 (ref 5–15)
BUN: 11 mg/dL (ref 6–20)
CHLORIDE: 98 mmol/L — AB (ref 101–111)
CO2: 27 mmol/L (ref 22–32)
Calcium: 8.6 mg/dL — ABNORMAL LOW (ref 8.9–10.3)
Creatinine, Ser: 1.16 mg/dL — ABNORMAL HIGH (ref 0.44–1.00)
GFR calc Af Amer: 53 mL/min — ABNORMAL LOW (ref >60)
GFR, EST NON AFRICAN AMERICAN: 46 mL/min — AB (ref >60)
GLUCOSE: 130 mg/dL — AB (ref 65–99)
POTASSIUM: 3.4 mmol/L — AB (ref 3.5–5.1)
SODIUM: 137 mmol/L (ref 135–145)

## 2014-10-23 LAB — BRAIN NATRIURETIC PEPTIDE: B NATRIURETIC PEPTIDE 5: 103 pg/mL — AB (ref 0.0–100.0)

## 2014-10-23 LAB — CBC
HEMATOCRIT: 33.6 % — AB (ref 36.0–46.0)
HEMOGLOBIN: 10.6 g/dL — AB (ref 12.0–15.0)
MCH: 24.9 pg — ABNORMAL LOW (ref 26.0–34.0)
MCHC: 31.5 g/dL (ref 30.0–36.0)
MCV: 78.9 fL (ref 78.0–100.0)
Platelets: 135 10*3/uL — ABNORMAL LOW (ref 150–400)
RBC: 4.26 MIL/uL (ref 3.87–5.11)
RDW: 19.4 % — ABNORMAL HIGH (ref 11.5–15.5)
WBC: 8.8 10*3/uL (ref 4.0–10.5)

## 2014-10-23 LAB — I-STAT TROPONIN, ED: Troponin i, poc: 0.02 ng/mL (ref 0.00–0.08)

## 2014-10-23 MED ORDER — PREDNISONE 10 MG (21) PO TBPK: ORAL_TABLET | ORAL | Status: DC

## 2014-10-23 MED ORDER — ALBUTEROL (5 MG/ML) CONTINUOUS INHALATION SOLN
10.0000 mg/h | INHALATION_SOLUTION | Freq: Once | RESPIRATORY_TRACT | Status: DC
  Filled 2014-10-23: qty 20

## 2014-10-23 MED ORDER — MORPHINE SULFATE (PF) 4 MG/ML IV SOLN
4.0000 mg | Freq: Once | INTRAVENOUS | Status: AC
  Administered 2014-10-23: 4 mg via INTRAVENOUS
  Filled 2014-10-23: qty 1

## 2014-10-23 MED ORDER — ALBUTEROL SULFATE (2.5 MG/3ML) 0.083% IN NEBU
5.0000 mg | INHALATION_SOLUTION | RESPIRATORY_TRACT | Status: DC | PRN
  Administered 2014-10-23: 5 mg via RESPIRATORY_TRACT

## 2014-10-23 NOTE — ED Notes (Signed)
Pt ambulated 100 feet in hallway on pulse ox. Pt's hr stayed between 80-95bpm during ambulation. Pt's o2 saturation did not drop below 94% during ambulation. Pt reports feeling "tipsy" towards end of ambulation and had a slow and slightly unsteady gait. Pt is now wheezing.  Janett Billow RN and Tomi Bamberger MD informed.

## 2014-10-23 NOTE — ED Notes (Signed)
Standard drainage bag swapped out for a Leg bag. Pt given instruction pamphlet that came with leg bag. PT informed on use of leg bag. Tubing secured with thigh strap. Bag labeled with time, date, location and initials.

## 2014-10-23 NOTE — ED Notes (Signed)
Pt returned from scans. Monitored by pulse ox, bp cuff, and 5-lead. 

## 2014-10-23 NOTE — Discharge Instructions (Signed)
Acute Urinary Retention, Female Acute urinary retention is the temporary inability to urinate. This is an uncommon problem in women. It can be caused by:  Infection.  A side effect of a medicine.  A problem in a nearby organ that presses or squeezes on the bladder or the urethra (the tube that drains the bladder).  Psychological problems.   Surgery on your bladder, urethra, or pelvic organs that causes obstruction to the outflow of urine from your bladder. HOME CARE INSTRUCTIONS  If you are sent home with a Foley catheter and a drainage system, you will need to discuss the best course of action with your health care provider. While the catheter is in, maintain a good intake of fluids. Keep the drainage bag emptied and lower than your catheter. This is so that contaminated urine will not flow back into your bladder, which could lead to a urinary tract infection. There are two main types of drainage bags. One is a large bag that usually is used at night. It has a good capacity that will allow you to sleep through the night without having to empty it. The second type is called a leg bag. It has a smaller capacity so it needs to be emptied more frequently. However, the main advantage is that it can be attached by a leg strap and goes underneath your clothing, allowing you the freedom to move about or leave your home. Only take over-the-counter or prescription medicines for pain, discomfort, or fever as directed by your health care provider.  SEEK MEDICAL CARE IF:  You develop a low-grade fever.  You experience spasms or leakage of urine with the spasms. SEEK IMMEDIATE MEDICAL CARE IF:   You develop chills or fever.  Your catheter stops draining urine.  Your catheter falls out.  You start to develop increased bleeding that does not respond to rest and increased fluid intake. MAKE SURE YOU:  Understand these instructions.  Will watch your condition.  Will get help right away if you are  not doing well or get worse.   This information is not intended to replace advice given to you by your health care provider. Make sure you discuss any questions you have with your health care provider.   Document Released: 12/22/2005 Document Revised: 05/09/2014 Document Reviewed: 06/03/2012 Elsevier Interactive Patient Education 2016 Else[employer] Chronic Obstructive Pulmonary Disease Chronic obstructive pulmonary disease (COPD) is a common lung condition in which airflow from the lungs is limited. COPD is a general term that can be used to describe many different lung problems that limit airflow, including both chronic bronchitis and emphysema. If you have COPD, your lung function will probably never return to normal, but there are measures you can take to improve lung function and make yourself feel better. CAUSES   Smoking (common).  Exposure to secondhand smoke.  Genetic problems.  Chronic inflammatory lung diseases or recurrent infections. SYMPTOMS  Shortness of breath, especially with physical activity.  Deep, persistent (chronic) cough with a large amount of thick mucus.  Wheezing.  Rapid breaths (tachypnea).  Gray or bluish discoloration (cyanosis) of the skin, especially in your fingers, toes, or lips.  Fatigue.  Weight loss.  Frequent infections or episodes when breathing symptoms become much worse (exacerbations).  Chest tightness. DIAGNOSIS Your health care provider will take a medical history and perform a physical examination to diagnose COPD. Additional tests for COPD may include:  Lung (pulmonary) function tests.  Chest X-ray.  CT scan.  Blood tests. TREATMENT  Treatment for COPD may include:  Inhaler and nebulizer medicines. These help manage the symptoms of COPD and make your breathing more comfortable.  Supplemental oxygen. Supplemental oxygen is only helpful if you have a low oxygen level in your blood.  Exercise and physical activity. These  are beneficial for nearly all people with COPD.  Lung surgery or transplant.  Nutrition therapy to gain weight, if you are underweight.  Pulmonary rehabilitation. This may involve working with a team of health care providers and specialists, such as respiratory, occupational, and physical therapists. HOME CARE INSTRUCTIONS  Take all medicines (inhaled or pills) as directed by your health care provider.  Avoid over-the-counter medicines or cough syrups that dry up your airway (such as antihistamines) and slow down the elimination of secretions unless instructed otherwise by your health care provider.  If you are a smoker, the most important thing that you can do is stop smoking. Continuing to smoke will cause further lung damage and breathing trouble. Ask your health care provider for help with quitting smoking. He or she can direct you to community resources or hospitals that provide support.  Avoid exposure to irritants such as smoke, chemicals, and fumes that aggravate your breathing.  Use oxygen therapy and pulmonary rehabilitation if directed by your health care provider. If you require home oxygen therapy, ask your health care provider whether you should purchase a pulse oximeter to measure your oxygen level at home.  Avoid contact with individuals who have a contagious illness.  Avoid extreme temperature and humidity changes.  Eat healthy foods. Eating smaller, more frequent meals and resting before meals may help you maintain your strength.  Stay active, but balance activity with periods of rest. Exercise and physical activity will help you maintain your ability to do things you want to do.  Preventing infection and hospitalization is very important when you have COPD. Make sure to receive all the vaccines your health care provider recommends, especially the pneumococcal and influenza vaccines. Ask your health care provider whether you need a pneumonia vaccine.  Learn and use  relaxation techniques to manage stress.  Learn and use controlled breathing techniques as directed by your health care provider. Controlled breathing techniques include:  Pursed lip breathing. Start by breathing in (inhaling) through your nose for 1 second. Then, purse your lips as if you were going to whistle and breathe out (exhale) through the pursed lips for 2 seconds.  Diaphragmatic breathing. Start by putting one hand on your abdomen just above your waist. Inhale slowly through your nose. The hand on your abdomen should move out. Then purse your lips and exhale slowly. You should be able to feel the hand on your abdomen moving in as you exhale.  Learn and use controlled coughing to clear mucus from your lungs. Controlled coughing is a series of short, progressive coughs. The steps of controlled coughing are: 1. Lean your head slightly forward. 2. Breathe in deeply using diaphragmatic breathing. 3. Try to hold your breath for 3 seconds. 4. Keep your mouth slightly open while coughing twice. 5. Spit any mucus out into a tissue. 6. Rest and repeat the steps once or twice as needed. SEEK MEDICAL CARE IF:  You are coughing up more mucus than usual.  There is a change in the color or thickness of your mucus.  Your breathing is more labored than usual.  Your breathing is faster than usual. SEEK IMMEDIATE MEDICAL CARE IF:  You have shortness of breath while you are resting.  You have shortness of breath that prevents you from:  Being able to talk.  Performing your usual physical activities.  You have chest pain lasting longer than 5 minutes.  Your skin color is more cyanotic than usual.  You measure low oxygen saturations for longer than 5 minutes with a pulse oximeter. MAKE SURE YOU:  Understand these instructions.  Will watch your condition.  Will get help right away if you are not doing well or get worse.   This information is not intended to replace advice given to  you by your health care provider. Make sure you discuss any questions you have with your health care provider.   Document Released: 10/02/2004 Document Revised: 01/13/2014 Document Reviewed: 08/19/2012 Elsevier Interactive Patient Education [employer].

## 2014-10-23 NOTE — ED Provider Notes (Signed)
CSN: [id number]     Arrival date & time 10/23/14  0709 History   First MD Initiated Contact with Patient 10/23/14 [phone]     Chief Complaint  Patient presents with  . Shortness of Breath   HPI Pt has had trouble with sharp pains in her head off and on for the last month.  It has progressively been increasing in intensity.  It started about a month and a hlaf ago.  It started as a stabbing sharp pain all over her head.  It has been increasing in duration and intensity.  She saw her doctor and had a biopsy over her temporal artery.  Scheduled for an MRI.  This weekend she had some nauea and vomiting.  She thinks she had a fever.  SOme coughing.  CHronically feels short of breath and that has been getting worse.  Today she could barely walk because of feeling more short of breath. EMS arriged noted wheezing and gave a neb.  Feeling better now. Past Medical History  Diagnosis Date  . COPD (chronic obstructive pulmonary disease) ([place])   . Diabetes mellitus without complication ([place])   . Atrial fibrillation ([place])   . GERD (gastroesophageal reflux disease)   . Hypertension    History reviewed. No pertinent past surgical history. History reviewed. No pertinent family history. Social History  Substance Use Topics  . Smoking status: Former Research scientist (life sciences)  . Smokeless tobacco: None  . Alcohol Use: No   OB History    No data available     Review of Systems  All other systems reviewed and are negative.     Allergies  Vasotec  Home Medications   Prior to Admission medications   Medication Sig Start Date End Date Taking? Authorizing Provider  amLODipine (NORVASC) 5 MG tablet Take 5 mg by mouth daily. 10/16/14  Yes Historical Provider, MD  atorvastatin (LIPITOR) 40 MG tablet Take 40 mg by mouth daily. 09/30/14  Yes Historical Provider, MD  bumetanide (BUMEX) 1 MG tablet Take 1 mg by mouth daily. 08/07/14  Yes Historical Provider, MD  busPIRone (BUSPAR) 15 MG tablet Take 1 tablet by mouth 2 (two)  times daily. 10/02/14  Yes Historical Provider, MD  carvedilol (COREG) 12.5 MG tablet Take 1 tablet by mouth 2 (two) times daily. 10/08/14  Yes Historical Provider, MD  escitalopram (LEXAPRO) 10 MG tablet Take 1 tablet by mouth daily. 08/01/14  Yes Historical Provider, MD  fluticasone (FLONASE) 50 MCG/ACT nasal spray Place 1 spray into both nostrils 2 (two) times daily. 10/16/14  Yes Historical Provider, MD  gabapentin (NEURONTIN) 100 MG capsule Take 100 mg by mouth 3 (three) times daily. 09/30/14  Yes Historical Provider, MD  HUMALOG 100 UNIT/ML injection Inject 15-25 Units into the skin 3 (three) times daily after meals. Sliding scale 10/21/14  Yes Historical Provider, MD  LANTUS 100 UNIT/ML injection Inject 38 Units into the skin daily. 10/21/14  Yes Historical Provider, MD  levothyroxine (SYNTHROID, LEVOTHROID) 100 MCG tablet Take 100 mcg by mouth every other day. Alternate with 169mcg 10/08/14  Yes Historical Provider, MD  levothyroxine (SYNTHROID, LEVOTHROID) 112 MCG tablet Take 112 mcg by mouth every other day. Alternate with 172mcg 10/08/14  Yes Historical Provider, MD  montelukast (SINGULAIR) 10 MG tablet Take 10 mg by mouth daily. 10/08/14  Yes Historical Provider, MD  omeprazole (PRILOSEC) 20 MG capsule Take 20 mg by mouth 2 (two) times daily. 10/20/14  Yes Historical Provider, MD  QUEtiapine (SEROQUEL) 25 MG tablet Take 25 mg by  mouth at bedtime. 10/02/14  Yes Historical Provider, MD  rOPINIRole (REQUIP) 1 MG tablet Take 1 mg by mouth 3 (three) times daily. 10/20/14  Yes Historical Provider, MD  sertraline (ZOLOFT) 100 MG tablet Take 150 mg by mouth at bedtime. 10/02/14  Yes Historical Provider, MD  SPIRIVA HANDIHALER 18 MCG inhalation capsule Take 1 puff by mouth daily. 10/16/14  Yes Historical Provider, MD  spironolactone (ALDACTONE) 25 MG tablet Take 25 mg by mouth daily. 08/27/14  Yes Historical Provider, MD  SYMBICORT 160-4.5 MCG/ACT inhaler Take 2 puffs by mouth 2 (two) times daily. 10/16/14   Yes Historical Provider, MD  predniSONE (STERAPRED UNI-PAK 21 TAB) 10 MG (21) TBPK tablet Take 6 tabs by mouth daily  for 2 days, then 5 tabs for 2 days, then 4 tabs for 2 days, then 3 tabs for 2 days, 2 tabs for 2 days, then 1 tab by mouth daily for 2 days 10/23/14   Dorie Rank, MD   BP 128/65 mmHg  Pulse 64  Temp(Src) 97.4 F (36.3 C) (Oral)  Resp 14  SpO2 92% Physical Exam  Constitutional: No distress.  HENT:  Head: Normocephalic and atraumatic.  Right Ear: External ear normal.  Left Ear: External ear normal.  Eyes: Conjunctivae are normal. Right eye exhibits no discharge. Left eye exhibits no discharge. No scleral icterus.  Neck: Neck supple. No tracheal deviation present.  Cardiovascular: Normal rate, regular rhythm and intact distal pulses.   Pulmonary/Chest: Effort normal. No stridor. No respiratory distress. She has wheezes (few wheezes at end expiration). She has no rales.  Speaking without difficulty  Abdominal: Soft. Bowel sounds are normal. She exhibits no distension. There is no tenderness. There is no rebound and no guarding.  Musculoskeletal: She exhibits no edema or tenderness.  Neurological: She is alert. She has normal strength. No cranial nerve deficit (no facial droop, extraocular movements intact, no slurred speech) or sensory deficit. She exhibits normal muscle tone. She displays no seizure activity. Coordination normal.  Skin: Skin is warm and dry. No rash noted.  Psychiatric: She has a normal mood and affect.  Nursing note and vitals reviewed.   ED Course  Procedures (including critical care time) Labs Review Labs Reviewed  BASIC METABOLIC PANEL - Abnormal; Notable for the following:    Potassium 3.4 (*)    Chloride 98 (*)    Glucose, Bld 130 (*)    Creatinine, Ser 1.16 (*)    Calcium 8.6 (*)    GFR calc non Af Amer 46 (*)    GFR calc Af Amer 53 (*)    All other components within normal limits  CBC - Abnormal; Notable for the following:    Hemoglobin  10.6 (*)    HCT 33.6 (*)    MCH 24.9 (*)    RDW 19.4 (*)    Platelets 135 (*)    All other components within normal limits  BRAIN NATRIURETIC PEPTIDE - Abnormal; Notable for the following:    B Natriuretic Peptide 103.0 (*)    All other components within normal limits  URINALYSIS, ROUTINE W REFLEX MICROSCOPIC (NOT AT [facility])  Randolm Idol, ED    Imaging Review Dg Chest 2 View  10/23/2014  CLINICAL DATA:  Persistent headache and vomiting, history of COPD and atrial fibrillation EXAM: CHEST  2 VIEW COMPARISON:  None. FINDINGS: Heart size is upper normal. Vascular pattern is normal. There is no focal consolidation and there are no pleural effusions. There is mild diffuse interstitial prominence. Evidence of  right rotator cuff repair. Degenerative change throughout the thoracic spine with aortic calcification. IMPRESSION: Mild diffuse interstitial opacification bilaterally. This likely reflects chronic interstitial lung disease. This does not appear consistent with pulmonary edema and diffuse bilateral atypical pneumonia is a consideration but considered less likely. Electronically Signed   By: Skipper Cliche M.D.   On: 10/23/2014 08:57   Ct Head Wo Contrast  10/23/2014  CLINICAL DATA:  Pt c/o " stabbing pains" all over her head, which are non-stop in frequency; pt denies any injury, dizziness, or body weakness; pt states that she has had this problem " for a long time" EXAM: CT HEAD WITHOUT CONTRAST TECHNIQUE: Contiguous axial images were obtained from the base of the skull through the vertex without intravenous contrast. COMPARISON:  None. FINDINGS: The calvarium is intact. Visualized portions of the paranasal sinuses are clear. Several mastoid air cells are opacified. There is no hemorrhage or extra-axial fluid. There is no hydrocephalus. There is no evidence of vascular territory infarct or mass. There is moderate diffuse atrophy. There is significant diffuse low attenuation in the white  matter. IMPRESSION: Moderate to severe age-related involutional change with no acute findings intracranially. Several left mastoid air cells are opacified. Electronically Signed   By: Skipper Cliche M.D.   On: 10/23/2014 08:49   I have personally reviewed and evaluated these images and lab results as part of my medical decision-making.   EKG Interpretation   Date/Time:  Monday October 23 2014 07:17:31 EDT Ventricular Rate:  64 PR Interval:  163 QRS Duration: 83 QT Interval:  439 QTC Calculation: 453 R Axis:   -35 Text Interpretation:  Sinus rhythm Left axis deviation Abnormal R-wave  progression, early transition Borderline repolarization abnormality  Baseline wander in lead(s) V4 poor quality in previous ECG Confirmed by  Korbin Notaro  MD-J, Chayah Mckee ([id number]) on 10/23/2014 7:21:14 AM      MDM   Final diagnoses:  Chronic obstructive pulmonary disease, unspecified COPD type (HCC)  Nonintractable headache, unspecified chronicity pattern, unspecified headache type  Urinary retention    Pt 's labs are unremarkable.  No acute findings on CT.   Doubt stroke or SAH.  CXR are chronic.  No PNA.  Pt was able to walk around the ED after treatment.  No hypoxia.  She does feel a little short of breath but she walks but this is chronic for her.  Pt does not feel that she needs to be admitted.  Will dc home on steroid course  Pt had 700 cc urine when catheterized.  Will dc home with leg bag.  Follow up with urology.    Dorie Rank, MD 10/23/14 [phone]

## 2014-10-23 NOTE — ED Notes (Signed)
Patient transported to CT 

## 2014-10-23 NOTE — ED Notes (Signed)
Pt placed in gown and in bed. Pt monitored by pulse ox, bp cuff, and 12-lead. Tomi Bamberger MD at bedside.

## 2014-10-23 NOTE — ED Notes (Signed)
Pt ambulating to rest room.

## 2014-10-23 NOTE — ED Notes (Addendum)
Per EMS: pt with increasing SOB; using home nebs with little relief; pt speaking complete sentences at present; 20g R AC; pt denies cough but sts fever; 10mg  albuterol and .5 atrovent given in route 125mg  solu medrol given also

## 2014-10-23 NOTE — ED Notes (Signed)
Pt unable to void with bedpan; pt given bedside toilet; pt sts voided some but requesting to be cathed as pt sts unable to empty her bladder

## 2014-10-24 ENCOUNTER — Other Ambulatory Visit: Payer: Self-pay | Admitting: Family Medicine

## 2014-10-24 DIAGNOSIS — R0902 Hypoxemia: Secondary | ICD-10-CM | POA: Diagnosis not present

## 2014-10-24 DIAGNOSIS — R0609 Other forms of dyspnea: Secondary | ICD-10-CM | POA: Diagnosis not present

## 2014-10-24 DIAGNOSIS — J432 Centrilobular emphysema: Secondary | ICD-10-CM | POA: Diagnosis not present

## 2014-10-24 DIAGNOSIS — E6609 Other obesity due to excess calories: Secondary | ICD-10-CM | POA: Diagnosis not present

## 2014-10-24 NOTE — Telephone Encounter (Signed)
Routing to provider  

## 2014-10-26 ENCOUNTER — Telehealth: Payer: Self-pay | Admitting: Family Medicine

## 2014-10-26 ENCOUNTER — Ambulatory Visit (INDEPENDENT_AMBULATORY_CARE_PROVIDER_SITE_OTHER): Payer: Medicare Other | Admitting: Family Medicine

## 2014-10-26 ENCOUNTER — Encounter: Payer: Self-pay | Admitting: Family Medicine

## 2014-10-26 VITALS — BP 121/72 | HR 88 | Temp 98.5°F | Ht 59.5 in | Wt 195.6 lb

## 2014-10-26 DIAGNOSIS — E038 Other specified hypothyroidism: Secondary | ICD-10-CM | POA: Diagnosis not present

## 2014-10-26 DIAGNOSIS — H0520 Unspecified exophthalmos: Secondary | ICD-10-CM | POA: Diagnosis not present

## 2014-10-26 DIAGNOSIS — J849 Interstitial pulmonary disease, unspecified: Secondary | ICD-10-CM | POA: Diagnosis not present

## 2014-10-26 DIAGNOSIS — R140 Abdominal distension (gaseous): Secondary | ICD-10-CM

## 2014-10-26 DIAGNOSIS — J432 Centrilobular emphysema: Secondary | ICD-10-CM

## 2014-10-26 DIAGNOSIS — G319 Degenerative disease of nervous system, unspecified: Secondary | ICD-10-CM | POA: Diagnosis not present

## 2014-10-26 DIAGNOSIS — Z23 Encounter for immunization: Secondary | ICD-10-CM | POA: Diagnosis not present

## 2014-10-26 DIAGNOSIS — E876 Hypokalemia: Secondary | ICD-10-CM

## 2014-10-26 DIAGNOSIS — R51 Headache: Secondary | ICD-10-CM | POA: Diagnosis not present

## 2014-10-26 DIAGNOSIS — R0902 Hypoxemia: Secondary | ICD-10-CM | POA: Diagnosis not present

## 2014-10-26 DIAGNOSIS — R519 Headache, unspecified: Secondary | ICD-10-CM

## 2014-10-26 DIAGNOSIS — R1310 Dysphagia, unspecified: Secondary | ICD-10-CM

## 2014-10-26 MED ORDER — KETOROLAC TROMETHAMINE 60 MG/2ML IM SOLN
60.0000 mg | Freq: Once | INTRAMUSCULAR | Status: AC
  Administered 2014-10-26: 30 mg via INTRAMUSCULAR

## 2014-10-26 MED ORDER — KETOROLAC TROMETHAMINE 60 MG/2ML IM SOLN: 30.0000 mg | Freq: Once | INTRAMUSCULAR | Status: AC

## 2014-10-26 MED ORDER — KETOROLAC TROMETHAMINE 30 MG/ML IJ SOLN: 30.0000 mg | Freq: Once | INTRAMUSCULAR | Status: DC

## 2014-10-26 MED ORDER — KETOROLAC TROMETHAMINE 30 MG/ML IJ SOLN: 30.0000 mg | Freq: Once | INTRAMUSCULAR | Status: AC

## 2014-10-26 NOTE — Assessment & Plan Note (Signed)
BiPAP stopped by Dr. Raul Del, last sleep study was okay per daughter

## 2014-10-26 NOTE — Assessment & Plan Note (Signed)
toradol given today, 30 mg IM x 1; offered referral to neuro; she still has ophthalmology appt and MRI pending; reviewed the CT scan with her

## 2014-10-26 NOTE — Patient Instructions (Addendum)
Please ask your eye doctor to send me copies of the notes Please do see the endocrinologist October 25th at 2:15 pm We'll get the pelvic ultrasound tomorrow Please do see the urologist I've ordered a referral to a gastroenterologist in [place] Please talk with your lung doctor about the abnormal chest xray I'm willing to have you see a neurologist about your headaches and atrophy of the brain tissue, so just call me after the eye appointment

## 2014-10-26 NOTE — Telephone Encounter (Signed)
[place] to get medical records for most recent referral appt, office staff stated that pt called to cancel appt

## 2014-10-26 NOTE — Assessment & Plan Note (Signed)
Re-order pelvic US with transvaginal US

## 2014-10-26 NOTE — Progress Notes (Signed)
BP 121/72 mmHg  Pulse 88  Temp(Src) 98.5 F (36.9 C)  Ht 4' 11.5" (1.511 m)  Wt 195 lb 9.6 oz (88.724 kg)  BMI 38.86 kg/m2  SpO2 95%   Subjective:    Patient ID: Bonnie Henry, female    DOB: 1940/10/27, 74 y.o.   MRN: [id number]  HPI: Bonnie Henry is a 74 y.o. female  Chief Complaint  Patient presents with  . Headache    pt states she gets atleast one headache per day, if not more. Patient states she sees spots often, with ot without the headache.  . OTHER    pt states she wants to infrom Dr. Sanda Klein about upcoming appointments and things with her health   She has been having headaches; she went to the ER on Oct 17th in [facility]; she says it feels close to a migraine  Head CT done in the ER:    Study Result     CLINICAL DATA: Pt c/o " stabbing pains" all over her head, which are non-stop in frequency; pt denies any injury, dizziness, or body weakness; pt states that she has had this problem " for a long time"  EXAM: CT HEAD WITHOUT CONTRAST  TECHNIQUE: Contiguous axial images were obtained from the base of the skull through the vertex without intravenous contrast.  COMPARISON: None.  FINDINGS: The calvarium is intact. Visualized portions of the paranasal sinuses are clear. Several mastoid air cells are opacified.  There is no hemorrhage or extra-axial fluid. There is no hydrocephalus. There is no evidence of vascular territory infarct or mass.  There is moderate diffuse atrophy. There is significant diffuse low attenuation in the white matter.  IMPRESSION: Moderate to severe age-related involutional change with no acute findings intracranially.  Several left mastoid air cells are opacified.   Electronically Signed  By: Skipper Cliche M.D.  On: 10/23/2014 08:49   They put her on oxygen she says, but they told her there was nothing wrong with her xray; they arranged for her to get oxygen; they saw her pulmonologist in  Tuesday; they had her up and moving up, sats go below 90% when she is up moving around; he has ordered portable light-weight; she does feel short of breath with any movement; sitting down is okay  CXR report copied and pasted: CLINICAL DATA: Persistent headache and vomiting, history of COPD and atrial fibrillation  EXAM: CHEST 2 VIEW  COMPARISON: None.  FINDINGS: Heart size is upper normal. Vascular pattern is normal. There is no focal consolidation and there are no pleural effusions. There is mild diffuse interstitial prominence. Evidence of right rotator cuff repair. Degenerative change throughout the thoracic spine with aortic calcification.  IMPRESSION: Mild diffuse interstitial opacification bilaterally. This likely reflects chronic interstitial lung disease. This does not appear consistent with pulmonary edema and diffuse bilateral atypical pneumonia is a consideration but considered less likely.   Electronically Signed  By: Skipper Cliche M.D.  On: 10/23/2014 08:57   She is going to see Dr. Allen Norris, sees hm at the end of next month; he is having a real problem swallowing; she feels like there is a little pocket in her throat; has had esophagus stretched twice in the past; it's been years since the last one  She goes to see her psychiatrist on Monday; she also has an appt with the pain clinic, and then gets her MRI done on Monday too  She had an eye appointment in September but that was cancelled because transportation issues  for hte daughter; they had appt Sept 27th; they are going to see another eye doctor in [place]  They have been to see Dr. Jefm Bryant, and he weaned her off of the prednisone; she has not been seen by him though since then and he referred her to the pain clinic  Diabetes; type 2; going to see endocrinologist on October 25th; sugars went up on the prednisone; 7 day average 289; 14 day average 240  She is going to a urologist; not going to  the bathroom correctly, so they put a catheter in and told her to see a urologist; that appt is next Thursday in [place]; that was placed on October 17th  Thyroid medicine is still alternating; poor energy level  Still having abdominal bloating; she has not had pelvic US  Relevant past medical, surgical, family and social history reviewed and updated as indicated. Interim medical history since our last visit reviewed. Allergies and medications reviewed and updated.  Review of Systems  Per HPI unless specifically indicated above     Objective:    BP 121/72 mmHg  Pulse 88  Temp(Src) 98.5 F (36.9 C)  Ht 4' 11.5" (1.511 m)  Wt 195 lb 9.6 oz (88.724 kg)  BMI 38.86 kg/m2  SpO2 95%  Wt Readings from Last 3 Encounters:  10/30/14 195 lb (88.451 kg)  10/26/14 195 lb 9.6 oz (88.724 kg)  09/29/14 195 lb (88.451 kg)    Physical Exam  Constitutional: She appears well-developed. No distress.  Eyes: Right eye exhibits no discharge. Left eye exhibits no discharge. Right conjunctiva is not injected. Left conjunctiva is not injected.  Asymmetry with proptosis of the right eye relative to the left  Cardiovascular: Normal rate and regular rhythm.   Pulmonary/Chest: Effort normal and breath sounds normal. No respiratory distress. She has no decreased breath sounds.  Abdominal: Soft. Bowel sounds are normal. There is tenderness in the left lower quadrant. There is no rigidity and no guarding.  Skin: No ecchymosis noted.  Psychiatric:  Good eye contact with examiner today    Results for orders placed or performed during the hospital encounter of 09/29/14  Surgical pcr screen  Result Value Ref Range   MRSA, PCR NEGATIVE NEGATIVE   Staphylococcus aureus NEGATIVE NEGATIVE  Glucose, capillary  Result Value Ref Range   Glucose-Capillary 125 (H) 65 - 99 mg/dL  Glucose, capillary  Result Value Ref Range   Glucose-Capillary 125 (H) 65 - 99 mg/dL  Glucose, capillary  Result Value Ref Range    Glucose-Capillary 127 (H) 65 - 99 mg/dL   Comment 1 Notify RN   I-STAT 4, (NA,K, GLUC, HGB,HCT)  Result Value Ref Range   Sodium 142 135 - 145 mmol/L   Potassium 4.3 3.5 - 5.1 mmol/L   Glucose, Bld 135 (H) 65 - 99 mg/dL   HCT 30.0 (L) 36.0 - 46.0 %   Hemoglobin 10.2 (L) 12.0 - 15.0 g/dL      Assessment & Plan:   Problem List Items Addressed This Visit      Respiratory   Chronic interstitial lung disease ([place])    Seeing pulmonologist      Centrilobular emphysema ([place])    Noted on scan, chart / problem list updated; seeing pulmonologist        Digestive   Dysphagia    Caution given to puree food, cut into tiny bits, chew thoroughly; REFER to [place] specialist urgently; hx of having to have esophageal dilatation, concern for stricture  Relevant Orders   Ambulatory referral to Gastroenterology     Endocrine   Adult hypothyroidism    Patient has had fluctuation thyroid tests, medicine dose changes, will be seeing endocrinologist again soon who can hopefully stabilize her regimen        Nervous and Auditory   Diffuse cerebral atrophy - Primary    Suggested neurology appt for headaches and diffuse cerebral atrophy; they want to wait until after eye appt        Other   Acquired exophthalmos    Orbital MRI pending and patient to see eye doctor      Unilateral headache    toradol given today, 30 mg IM x 1; offered referral to neuro; she still has ophthalmology appt and MRI pending; reviewed the CT scan with her      Relevant Medications   ketorolac (TORADOL) injection 60 mg (Completed)   Oxygen desaturation   RESOLVED: Abdominal bloating    Re-order pelvic US with transvaginal US      Relevant Orders   US Transvaginal Non-OB (Completed)   US Pelvis Complete (Completed)    Other Visit Diagnoses    Immunization due        Relevant Orders    Flu Vaccine QUAD 36+ mos PF IM (Fluarix & Fluzone Quad PF) (Completed)    Hypokalemia           Follow up  plan: Return in about 3 weeks (around 11/16/2014) for multiple issues.  An after-visit summary was printed and given to the patient at [facility].  Please see the patient instructions which may contain other information and recommendations beyond what is mentioned above in the assessment and plan.  Orders Placed This Encounter  Procedures  . US Transvaginal Non-OB  . US Pelvis Complete  . Flu Vaccine QUAD 36+ mos PF IM (Fluarix & Fluzone Quad PF)  . Ambulatory referral to Gastroenterology   Face-to-face time with patient was more than 40 minutes, >50% time spent counseling and coordination of care

## 2014-10-26 NOTE — Assessment & Plan Note (Signed)
Caution given to puree food, cut into tiny bits, chew thoroughly; REFER to [facility] specialist urgently; hx of having to have esophageal dilatation, concern for stricture

## 2014-10-26 NOTE — Telephone Encounter (Signed)
Pt and daughter asking about pt getting her diabetic shoes, states [employer] needs more information before they can give her the shoes.  They would like you to call [employer] and give them the info so she can get her shoes.

## 2014-10-27 ENCOUNTER — Ambulatory Visit
Admission: RE | Admit: 2014-10-27 | Discharge: 2014-10-27 | Disposition: A | Discharge status: Home / Self Care | Payer: Medicare Other | Source: Ambulatory Visit | Attending: Family Medicine | Admitting: Family Medicine

## 2014-10-27 ENCOUNTER — Telehealth: Payer: Self-pay | Admitting: Family Medicine

## 2014-10-27 DIAGNOSIS — R140 Abdominal distension (gaseous): Secondary | ICD-10-CM | POA: Diagnosis not present

## 2014-10-27 NOTE — Telephone Encounter (Signed)
I found information in PP from [place] [facility] [place], attempted to call patient to see if that was the correct place. Left message to call.

## 2014-10-27 NOTE — Telephone Encounter (Signed)
Called patient to ask for more information and [employer]'s phone number. She did not have their number and said she'd have her daughter call me back with their number this afternoon.

## 2014-10-30 ENCOUNTER — Telehealth: Payer: Self-pay | Admitting: Family Medicine

## 2014-10-30 ENCOUNTER — Ambulatory Visit (HOSPITAL_COMMUNITY)
Admission: RE | Admit: 2014-10-30 | Discharge: 2014-10-30 | Disposition: A | Discharge status: Home / Self Care | Payer: Medicare Other | Source: Ambulatory Visit | Attending: Family Medicine | Admitting: Family Medicine

## 2014-10-30 DIAGNOSIS — F411 Generalized anxiety disorder: Secondary | ICD-10-CM | POA: Diagnosis not present

## 2014-10-30 DIAGNOSIS — M4806 Spinal stenosis, lumbar region: Secondary | ICD-10-CM | POA: Diagnosis not present

## 2014-10-30 DIAGNOSIS — F4310 Post-traumatic stress disorder, unspecified: Secondary | ICD-10-CM | POA: Diagnosis not present

## 2014-10-30 DIAGNOSIS — H0520 Unspecified exophthalmos: Secondary | ICD-10-CM | POA: Diagnosis present

## 2014-10-30 DIAGNOSIS — R1310 Dysphagia, unspecified: Secondary | ICD-10-CM

## 2014-10-30 DIAGNOSIS — M5416 Radiculopathy, lumbar region: Secondary | ICD-10-CM | POA: Diagnosis not present

## 2014-10-30 DIAGNOSIS — F331 Major depressive disorder, recurrent, moderate: Secondary | ICD-10-CM | POA: Diagnosis not present

## 2014-10-30 LAB — POCT I-STAT CREATININE: Creatinine, Ser: 1.1 mg/dL — ABNORMAL HIGH (ref 0.44–1.00)

## 2014-10-30 MED ORDER — GADOBENATE DIMEGLUMINE 529 MG/ML IV SOLN
20.0000 mL | Freq: Once | INTRAVENOUS | Status: AC | PRN
  Administered 2014-10-30: 18 mL via INTRAVENOUS

## 2014-10-30 NOTE — Telephone Encounter (Signed)
I faxed forms back to [place] [facility] [place] that were done previously, but Dr. Sanda Klein had denied them due to no documentation from them regarding her supposed "foot deformity" or pre-ulcerative callous.

## 2014-10-30 NOTE — Telephone Encounter (Signed)
IMPRESSION: Stable MRI appearance of the orbits with no acute finding or orbital mass. Abundance of intraorbital fat, which can be seen as a sequelae of thyroid related ophthalmopathy. ------------------------- I will call patient with results tomorrow

## 2014-10-31 DIAGNOSIS — G319 Degenerative disease of nervous system, unspecified: Secondary | ICD-10-CM | POA: Insufficient documentation

## 2014-10-31 NOTE — Assessment & Plan Note (Signed)
Suggested neurology appt for headaches and diffuse cerebral atrophy; they want to wait until after eye appt

## 2014-10-31 NOTE — Assessment & Plan Note (Signed)
Orbital MRI pending and patient to see eye doctor

## 2014-10-31 NOTE — Assessment & Plan Note (Signed)
Patient has had fluctuation thyroid tests, medicine dose changes, will be seeing endocrinologist again soon who can hopefully stabilize her regimen

## 2014-10-31 NOTE — Assessment & Plan Note (Signed)
Noted on scan, chart / problem list updated; seeing pulmonologist

## 2014-10-31 NOTE — Assessment & Plan Note (Signed)
Refer to specialist, either GI or gen surgery, for consideration of EGD and possible dilatation

## 2014-10-31 NOTE — Telephone Encounter (Signed)
I talked with daughter about MRI results; intraorbital fat, no mass; to see eye doctor and thyroid doctor I would like patient to see an ophthalmologist; endo appt is already scheduled for Nov 3rd She needs to see an ophthalmologist, not an optometrist; Kellie Shropshire can help make sure she is getting to the right one if daughter needs her help; daughter said they would go to [facility] but I'm not sure that would be an M.D. Daughter also said that she was told her mother could not see GI until December; I told daughter we'll try to get her in to see general surgery for EGD and possible dilatation if GI can't see her (in [place]) ----------------------- Tiffany Foxx:  Please f/u on general surgery referral for dysphagia, [place] preferably Also, see if daughter needs help with the eye appt for thyroid-related ophthalmopathy

## 2014-10-31 NOTE — Assessment & Plan Note (Signed)
Seeing pulmonologist 

## 2014-11-01 NOTE — Telephone Encounter (Signed)
Pt recently had appt with [facility] on 10/03/14, cancelled appt. So it may be easier to get her back into there, not certain. Seems as though they prefer appts in [place]. Also as I had mentioned before in conversation I was able to change the priority of her referral to GI and resubmitted her referral to GI at [facility] in [place] and spoke with Rachel Bo about this and its urgency for you. Her chart is supposed to be in review and Rachel Bo stated that he would touch base with me within the next 48 hours with and update within the referral itself. So when I found out an update I will definitely let you know.

## 2014-11-06 NOTE — Telephone Encounter (Signed)
Signing off ?

## 2014-11-07 ENCOUNTER — Other Ambulatory Visit: Payer: Self-pay | Admitting: Physical Medicine and Rehabilitation

## 2014-11-07 DIAGNOSIS — M545 Low back pain: Principal | ICD-10-CM

## 2014-11-07 DIAGNOSIS — G8929 Other chronic pain: Secondary | ICD-10-CM

## 2014-11-09 ENCOUNTER — Other Ambulatory Visit: Payer: Self-pay | Admitting: Specialist

## 2014-11-09 DIAGNOSIS — E039 Hypothyroidism, unspecified: Secondary | ICD-10-CM | POA: Diagnosis not present

## 2014-11-09 DIAGNOSIS — R1319 Other dysphagia: Secondary | ICD-10-CM | POA: Diagnosis not present

## 2014-11-09 DIAGNOSIS — E6609 Other obesity due to excess calories: Secondary | ICD-10-CM | POA: Diagnosis not present

## 2014-11-09 DIAGNOSIS — R1310 Dysphagia, unspecified: Secondary | ICD-10-CM

## 2014-11-09 DIAGNOSIS — E1140 Type 2 diabetes mellitus with diabetic neuropathy, unspecified: Secondary | ICD-10-CM | POA: Diagnosis not present

## 2014-11-09 DIAGNOSIS — J449 Chronic obstructive pulmonary disease, unspecified: Secondary | ICD-10-CM | POA: Diagnosis not present

## 2014-11-09 DIAGNOSIS — E1165 Type 2 diabetes mellitus with hyperglycemia: Secondary | ICD-10-CM | POA: Diagnosis not present

## 2014-11-09 DIAGNOSIS — J309 Allergic rhinitis, unspecified: Secondary | ICD-10-CM | POA: Diagnosis not present

## 2014-11-13 ENCOUNTER — Ambulatory Visit
Admission: RE | Admit: 2014-11-13 | Discharge: 2014-11-13 | Disposition: A | Discharge status: Home / Self Care | Payer: Medicare Other | Source: Ambulatory Visit | Attending: Physical Medicine and Rehabilitation | Admitting: Physical Medicine and Rehabilitation

## 2014-11-13 ENCOUNTER — Other Ambulatory Visit: Payer: Self-pay | Admitting: Physical Medicine and Rehabilitation

## 2014-11-13 DIAGNOSIS — M545 Low back pain, unspecified: Secondary | ICD-10-CM

## 2014-11-13 DIAGNOSIS — M5126 Other intervertebral disc displacement, lumbar region: Secondary | ICD-10-CM | POA: Diagnosis not present

## 2014-11-13 DIAGNOSIS — G8929 Other chronic pain: Secondary | ICD-10-CM

## 2014-11-13 DIAGNOSIS — R338 Other retention of urine: Secondary | ICD-10-CM | POA: Diagnosis not present

## 2014-11-13 MED ORDER — METHYLPREDNISOLONE ACETATE 40 MG/ML INJ SUSP (RADIOLOG
120.0000 mg | Freq: Once | INTRAMUSCULAR | Status: AC
  Administered 2014-11-13: 120 mg via EPIDURAL

## 2014-11-13 MED ORDER — IOHEXOL 180 MG/ML  SOLN
1.0000 mL | Freq: Once | INTRAMUSCULAR | Status: DC | PRN
  Administered 2014-11-13: 1 mL via EPIDURAL

## 2014-11-13 NOTE — Discharge Instructions (Signed)

## 2014-11-14 ENCOUNTER — Ambulatory Visit
Admission: RE | Admit: 2014-11-14 | Discharge: 2014-11-14 | Disposition: A | Discharge status: Home / Self Care | Payer: Medicare Other | Source: Ambulatory Visit | Attending: Specialist | Admitting: Specialist

## 2014-11-14 DIAGNOSIS — R1310 Dysphagia, unspecified: Secondary | ICD-10-CM | POA: Diagnosis not present

## 2014-11-14 NOTE — Therapy (Signed)
[place], [place], [address] Phone: [phone]   Fax:     Modified Barium Swallow  Patient Details  Name: Bonnie Henry MRN: [id number] Date of Birth: 02-21-1940 No Data Recorded  Encounter Date: 11/14/2014   Subjective: Patient behavior: (alertness, ability to follow instructions, etc.): pt alert, verbally conversive. Followed instructions appropriately. Pt gave detailed information to her swallowing difficulty identifying the Esophageal area.  Chief complaint: dysphagia. Pt described Esophageal dysmotility and had c/o reflux and regurgitation frequently. Pt stated she has had 2 esophageal dilitations in the past, one ~5 yrs. ago. She currently describes that the food(such as sandwiches) "gets stuck here" pointing to her mid-distal chest area. She explained that she has to "make it come back up". She described that other foods such as soups did not cause discomfort. Pt also stated she has a Hiatal Hernia per MD in the past and wondered if that could be affecting the Esophagus. Pt stated she eats a regular diet at home; she denied any h/o CVA or neurological diseases. She endorsed taking a PPI 2x daily but did not remember the name of it. Pt does wear dentures.   Objective:  Radiological Procedure: A videoflouroscopic evaluation of oral-preparatory, reflex initiation, and pharyngeal phases of the swallow was performed; as well as a screening of the upper esophageal phase.  I. POSTURE: upright II. VIEW: lateral III. COMPENSATORY STRATEGIES: time b/t boluses IV. BOLUSES ADMINISTERED:  Thin Liquid: 7 trials  Nectar-thick Liquid: 1 trial  Honey-thick Liquid: NT  Puree: 3 trials  Mechanical Soft: 2 trials V. RESULTS OF EVALUATION: A. ORAL PREPARATORY PHASE: (The lips, tongue, and velum are observed for strength and coordination)       **Overall Severity Rating: WFL. Pt exhibited adequate oral phase  control and mastication of boluses; manipulation and A-P transfer were timely; oral clearing appropriate.   B. SWALLOW INITIATION/REFLEX: (The reflex is normal if "triggered" by the time the bolus reached the base of the tongue)  **Overall Severity Rating: WFL-Mild. Pt exhibited a mildly delayed pharyngeal swallow initiation w/ trials of liquids(moreso thin liquids) c/b thin liquids spilling to/filling the pyriform sinuses b/f the pharyngeal swallow was triggered. W/ trials of puree/solids, the pharyngeal swallow was triggered at the valleculae. No Aspiration was noted during trials. Suspect this delay in pharyngeal swallow initiation could be related to decreased pharyngeal sensation sec. to pt's description of "so many times of vomiting and reflux".    C. PHARYNGEAL PHASE: (Pharyngeal function is normal if the bolus shows rapid, smooth, and continuous transit through the pharynx and there is no pharyngeal residue after the swallow)  **Overall Severity Rating: [facility]. Pt demo. Appropriate pharyngeal clearing of bolus material during the swallow indicating adequate pharyngeal pressure and laryngeal excursion.   D. LARYNGEAL PENETRATION: (Material entering into the laryngeal inlet/vestibule but not aspirated): occurred x1 as "flash penetration" which appeared to clear w/ completion of the swallow. E. ASPIRATION: None. F. ESOPHAGEAL PHASE: (Screening of the upper esophagus): min. Slower clearing of bolus residue in approximately the mid-Esophagus but time was given b/t trials to aid this.   ASSESSMENT: Pt appeared to present w/ adequate oropharyngeal phase swallow function w/ bolus trials given. Pt exhibited mild delay in pharyngeal swallow initiation w/ thin liquid trials but was able to appropriately manage single sips of thin liquids via cup following general aspiration precautions w/ no Aspiration noted to occur. Suspect the decreased pharyngeal sensation impacting the timing of the pharyngeal swallow  (  trigger) could be related to the pt's repeated episodes of regurgitation and reflux. No oral or other pharyngeal phase deficits noted during the swallowing. Pt appears at reduced risk for aspiration w/ foods/liquids following general aspiration precautions. Pt appears at min. Increased risk for aspiration of any regurgitated food/liquid material during the episodes of vomiting she described occurs frequently at home. Rec. F/u w/ GI for further consult.   PLAN/RECOMMENDATIONS:  A. Diet: mech soft/regular; thin liquids (Avoid those foods that increase Esophageal discomfort and promote regurgitation)  B. Swallowing Precautions: general aspiration precautions; moisten foods; time b/t bites and sips and during meals  C. Recommended consultation to: GI for further assessment  D. Therapy recommendations: none  E. Results and recommendations were: discussed w/ pt and Dtr; all agreed w/ above.      End of Session - 11/14/14 1428    Visit Number 1   Number of Visits 1   Date for SLP Re-Evaluation 11/14/14   SLP Start Time 1300   SLP Stop Time  1400   SLP Time Calculation (min) 60 min   Activity Tolerance Patient tolerated treatment well      Past Medical History  Diagnosis Date  . Hypertension   . GERD (gastroesophageal reflux disease)   . CAD (coronary artery disease)   . Hypercholesteremia   . COPD (chronic obstructive pulmonary disease) ([place])   . Morbid obesity ([place])   . Post-surgical hypothyroidism   . Depression, major ([place])   . Neuropathy ([place])   . Asthma   . Sleep apnea     "2015 wore mask; lost weight; told didn't need mask anymore" (02/23/2014)  . Type II diabetes mellitus ([place])   . Anxiety   . Anemia   . History of blood transfusion     "when I had my mastectomy"  . Arthritis     "joints ache all over"   . Chronic back pain   . Breast cancer, left breast ([facility])     S/P mastectomy  . Myocardial infarction ([place])   . Shortness of breath dyspnea   . Heart murmur   .  Headache   . History of hiatal hernia     Past Surgical History  Procedure Laterality Date  . Bartholin cyst marsupialization    . Trigger finger release Left   . Cataract extraction w/ intraocular lens  implant, bilateral Bilateral ~ 2011  . Total knee arthroplasty Left 1990's  . Total hip arthroplasty Bilateral 1990's  . Thyroidectomy, partial  1950's  . Shoulder surgery Right     "cleaned it out; no scope"  . Toe surgery Right     "cut piece of bone out so toe didn't dig into my foot"  . Carotid endarterectomy Left 2009  . Hammer toe surgery Left     2nd and 3rd digits  . Tonsillectomy  1950's  . Joint replacement    . Breast biopsy Left   . Mastectomy Left 1990's  . Tubal ligation    . Vaginal hysterectomy      "partial"  . Coronary angioplasty with stent placement  1990's X 2    "2; 1"  . Cardiac catheterization  2000's  . Left heart catheterization with coronary angiogram N/A 02/24/2014    Procedure: LEFT HEART CATHETERIZATION WITH CORONARY ANGIOGRAM;  Surgeon: Leonie Man, MD;  Location: [facility] CATH LAB;  Service: Cardiovascular;  Laterality: N/A;  . Artery biopsy Right 09/29/2014    Procedure: BIOPSY TEMPORAL ARTERY;  Surgeon: Angelia Mould, MD;  Location: [facility] OR;  Service: Vascular;  Laterality: Right;    There were no vitals filed for this visit.  Visit Diagnosis: Dysphagia - Plan: DG OP Swallowing Func-Medicare/Speech Path, DG OP Swallowing Func-Medicare/Speech Path                               G-Codes - November 23, 2014 1429    Functional Assessment Tool Used clinical judgement   Functional Limitations Swallowing   Swallow Current Status (V2023) At least 1 percent but less than 20 percent impaired, limited or restricted   Swallow Goal Status (X4356) At least 1 percent but less than 20 percent impaired, limited or restricted   Swallow Discharge Status [id number]) At least 1 percent but less than 20 percent impaired, limited or restricted           Problem List Patient Active Problem List   Diagnosis Date Noted  . 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 Chronic interstitial lung disease ([place]) 10/26/2014  . Centrilobular emphysema ([place]) 10/26/2014  . Oxygen desaturation 10/26/2014  . Dysphagia 10/18/2014  . Unilateral headache 10/02/2014  . Vision blurred 09/28/2014  . Acquired exophthalmos 09/28/2014  . Diabetes mellitus with diabetic neuropathy ([place]) 08/31/2014  . Hammertoe 08/31/2014  . Foot callus 08/31/2014  . 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 Anemia 05/21/2014  . Chest pain 05/20/2014  . Chronic pain 03/02/2014  . Clinical depression 03/02/2014  . Acid reflux 03/02/2014  . Glaucoma 03/02/2014  . BP (high blood pressure) 03/02/2014  . Adult hypothyroidism 03/02/2014  . Adiposity 03/02/2014  . Inflamed nasal mucosa 03/02/2014  . Apnea, sleep 03/02/2014  . PAT (paroxysmal atrial tachycardia) ([place]) 02/24/2014  . Unstable angina ([place]) 02/23/2014  . Type 2 diabetes with nephropathy ([place]) 02/23/2014  . HTN (hypertension) 02/23/2014  . CAD S/P remote PCI  02/23/2014  . High cholesterol 02/23/2014  . COPD (chronic obstructive pulmonary disease) ([place]) 02/23/2014  . PVD- h/o LCEA 02/23/2014  . Obesity (BMI 30-39.9) 02/23/2014  . Aortic stenosis murmur 02/23/2014  . COPD, moderate ([place]) 07/27/2013       Orinda Kenner, [place], CCC-SLP  [place],Katherine 2014-11-23, 2:30 PM  [facility] [place], [place], [address] Phone: [phone]   Fax:     Name: Bonnie Henry MRN: [id number] Date of Birth: 01/23/1940

## 2014-11-20 ENCOUNTER — Other Ambulatory Visit: Payer: Self-pay | Admitting: Family Medicine

## 2014-11-20 ENCOUNTER — Telehealth: Payer: Self-pay | Admitting: Family Medicine

## 2014-11-20 NOTE — Telephone Encounter (Signed)
Routing to provider  

## 2014-11-20 NOTE — Telephone Encounter (Signed)
I saw the results of the swallow study Please contact patient; make sure she knows all the recommendations that speech pathologist wants her to do Make sure she has f/u with GI as recommended in the report Thank you

## 2014-11-22 ENCOUNTER — Encounter: Payer: Self-pay | Admitting: Family Medicine

## 2014-11-22 ENCOUNTER — Ambulatory Visit (INDEPENDENT_AMBULATORY_CARE_PROVIDER_SITE_OTHER): Payer: Medicare Other | Admitting: Family Medicine

## 2014-11-22 VITALS — BP 135/61 | HR 60 | Temp 97.8°F | Wt 192.0 lb

## 2014-11-22 DIAGNOSIS — Z1231 Encounter for screening mammogram for malignant neoplasm of breast: Secondary | ICD-10-CM

## 2014-11-22 DIAGNOSIS — M25552 Pain in left hip: Secondary | ICD-10-CM | POA: Insufficient documentation

## 2014-11-22 MED ORDER — LACTULOSE 10 GM/15ML PO SOLN: 10.0000 g | Freq: Every day | ORAL | Status: DC | PRN

## 2014-11-22 MED ORDER — HYDROCODONE-ACETAMINOPHEN 5-325 MG PO TABS: 1.0000 | ORAL_TABLET | Freq: Four times a day (QID) | ORAL | Status: DC | PRN

## 2014-11-22 NOTE — Progress Notes (Signed)
BP 135/61 mmHg  Pulse 60  Temp(Src) 97.8 F (36.6 C)  Wt 192 lb (87.091 kg)  SpO2 98%   Subjective:    Patient ID: Bonnie Henry, female    DOB: [birth year], 74 y.o.   MRN: [id number]  HPI: Bonnie Henry is a 74 y.o. female  Chief Complaint  Patient presents with  . Follow-up    from last month appt.  . Hip Pain    left hip pain x 2 months   She does not have an orthopaedist; the left hip hurts; going on for two months; no injury; she has had a hip replacement on the left side; Dr. Christen Bame in [place], maybe 8 years ago or so; once she stands, it pulls her over to get relief; once standing up, feels better to flex and then sit down; no rubs; no heat or ice; no recent PT; no recent xrays right hip is completely fine; no redness and no rash; no crunching or locking or popping; have had weakness in the legs for a while, probably from spine issue; I asked about pain medicine, what can she take; wheelchair; legs are weak; she refuses physical therapy  She quit taking her bumetinide  Relevant past medical, surgical, family and social history reviewed and updated as indicated. Interim medical history since our last visit reviewed. Allergies and medications reviewed and updated.  Review of Systems Per HPI unless specifically indicated above     Objective:    BP 135/61 mmHg  Pulse 60  Temp(Src) 97.8 F (36.6 C)  Wt 192 lb (87.091 kg)  SpO2 98%  Wt Readings from Last 3 Encounters:  11/22/14 192 lb (87.091 kg)  10/30/14 195 lb (88.451 kg)  10/26/14 195 lb 9.6 oz (88.724 kg)    Physical Exam  Constitutional: She appears well-developed and well-nourished.  Elderly, appears chronically ill; deconditioned, older than stated age; nontoxic  Cardiovascular: Normal rate and regular rhythm.   Pulmonary/Chest: Effort normal and breath sounds normal.  Abdominal: She exhibits no distension.  Musculoskeletal:       Left hip: She exhibits tenderness.  Psychiatric: She has a  normal mood and affect.   Results for orders placed or performed during the hospital encounter of 10/30/14  I-STAT creatinine  Result Value Ref Range   Creatinine, Ser 1.10 (H) 0.44 - 1.00 mg/dL      Assessment & Plan:   Problem List Items Addressed This Visit      Other   Left hip pain - Primary    Status post left total hip replacement around 2008; will get xrays and refer to orthopaedist; narcotic Rxd for pain control; do NOT mix with any other pain medicine; turmeric suggested as natural anti-inflammatory; topicals may help; suggested physical therapy; she is welcome to call if she changes her mind and opts to start PT; with narcotic, use lactulose and fiber and hydration to help lessen chance of constipation      Relevant Orders   DG HIP UNILAT W OR W/O PELVIS 2-3 VIEWS LEFT (Completed)   Encounter for screening mammogram for breast cancer   Relevant Orders   MM SCREENING BREAST TOMO BILATERAL      Follow up plan: Return in about 1 month (around 12/22/2014) for fasting labs and visit.  An after-visit summary was printed and given to the patient at [facility].  Please see the patient instructions which may contain other information and recommendations beyond what is mentioned above in the assessment and plan.  Meds  ordered this encounter  Medications  . HYDROcodone-acetaminophen (NORCO/VICODIN) 5-325 MG tablet    Sig: Take 1 tablet by mouth every 6 (six) hours as needed for moderate pain.    Dispense:  30 tablet    Refill:  0  . lactulose (CHRONULAC) 10 GM/15ML solution    Sig: Take 15 mLs (10 g total) by mouth daily as needed for mild constipation.    Dispense:  946 mL    Refill:  3

## 2014-11-22 NOTE — Patient Instructions (Addendum)
Please get xrays of the left hip Try turmeric as a natural anti-inflammatory (for pain and arthritis). It comes in capsules where you buy aspirin and fish oil, but also as a spice where you buy pepper and garlic powder. Use the Tylenol (acetaminophen) every 6 to 8 hours for baseline pain control, but make sure you don't exceed 3,000 mg a day Use the hydrocodone if needed We'll have you see orthopaedics next If you change your mind about working with physical therapy, let me know Practice good fall precautions Start the lactulose and use to help prevent constipation Get plenty of hydration and fiber

## 2014-11-22 NOTE — Telephone Encounter (Signed)
Patient notified

## 2014-11-23 ENCOUNTER — Ambulatory Visit
Admission: RE | Admit: 2014-11-23 | Discharge: 2014-11-23 | Disposition: A | Discharge status: Home / Self Care | Payer: Medicare Other | Source: Ambulatory Visit | Attending: Family Medicine | Admitting: Family Medicine

## 2014-11-23 DIAGNOSIS — M25552 Pain in left hip: Secondary | ICD-10-CM | POA: Diagnosis present

## 2014-11-24 ENCOUNTER — Encounter: Payer: Self-pay | Admitting: Family Medicine

## 2014-11-27 DIAGNOSIS — Z1231 Encounter for screening mammogram for malignant neoplasm of breast: Secondary | ICD-10-CM | POA: Insufficient documentation

## 2014-11-27 NOTE — Assessment & Plan Note (Addendum)
Status post left total hip replacement around 2008; will get xrays and refer to orthopaedist; narcotic Rxd for pain control; do NOT mix with any other pain medicine; turmeric suggested as natural anti-inflammatory; topicals may help; suggested physical therapy; she is welcome to call if she changes her mind and opts to start PT; with narcotic, use lactulose and fiber and hydration to help lessen chance of constipation

## 2014-11-29 ENCOUNTER — Encounter: Payer: Self-pay | Admitting: Family Medicine

## 2014-12-02 ENCOUNTER — Other Ambulatory Visit: Payer: Self-pay | Admitting: Family Medicine

## 2014-12-04 NOTE — Telephone Encounter (Signed)
Dr Lada patient-routing to provider. 

## 2014-12-05 ENCOUNTER — Ambulatory Visit: Payer: Self-pay | Admitting: Gastroenterology

## 2014-12-05 ENCOUNTER — Encounter: Payer: Self-pay | Admitting: Gastroenterology

## 2014-12-05 ENCOUNTER — Ambulatory Visit (INDEPENDENT_AMBULATORY_CARE_PROVIDER_SITE_OTHER): Payer: Medicare Other | Admitting: Gastroenterology

## 2014-12-05 VITALS — BP 134/73 | HR 60 | Temp 98.0°F | Ht 59.5 in | Wt 193.4 lb

## 2014-12-05 DIAGNOSIS — R1310 Dysphagia, unspecified: Secondary | ICD-10-CM | POA: Diagnosis not present

## 2014-12-05 NOTE — Progress Notes (Signed)
Primary Care Physician: Enid Derry, MD  Primary Gastroenterologist:  Dr. Lucilla Lame  Chief Complaint  Patient presents with  . Dysphagia    HPI: Bonnie Henry is a 74 y.o. female here for dysphagia. The patient was seen back in the beginning of last year for the same symptoms but she reports it is different now. The patient states that she is having trouble swallowing with food getting stuck where she has to vomit food back up. There is no report of any black stools or bloody stools. She also denies any unexplained weight loss.  Current Outpatient Prescriptions  Medication Sig Dispense Refill  . acetaminophen (TYLENOL) 500 MG tablet Take 1,000 mg by mouth every 6 (six) hours as needed (pain).    Marland Kitchen acetylcysteine (MUCOMYST) 20 % nebulizer solution Inhale into the lungs.    Marland Kitchen albuterol (PROAIR HFA) 108 (90 BASE) MCG/ACT inhaler Inhale 2 puffs into the lungs every 4 (four) hours as needed for wheezing or shortness of breath.     Marland Kitchen albuterol (PROVENTIL) (2.5 MG/3ML) 0.083% nebulizer solution Take 2.5 mg by nebulization every 4 (four) hours as needed for wheezing or shortness of breath.    Marland Kitchen amLODipine (NORVASC) 5 MG tablet Take 5 mg by mouth daily.     Marland Kitchen amLODipine (NORVASC) 5 MG tablet Take 5 mg by mouth daily.    Marland Kitchen aspirin EC 81 MG tablet Take 81 mg by mouth daily.    Marland Kitchen atorvastatin (LIPITOR) 40 MG tablet Take 1 tablet (40 mg total) by mouth at bedtime. 30 tablet 5  . atorvastatin (LIPITOR) 40 MG tablet Take 40 mg by mouth daily.    . bumetanide (BUMEX) 1 MG tablet Take 1 mg by mouth daily.    . busPIRone (BUSPAR) 15 MG tablet Take 15 mg by mouth 2 (two) times daily.     . busPIRone (BUSPAR) 15 MG tablet Take 1 tablet by mouth 2 (two) times daily.    . carvedilol (COREG) 12.5 MG tablet Take 12.5 mg by mouth 2 (two) times daily with a meal.     . carvedilol (COREG) 12.5 MG tablet Take 1 tablet by mouth 2 (two) times daily.    Marland Kitchen escitalopram (LEXAPRO) 10 MG tablet Take 1 tablet  by mouth daily.    . fluticasone (FLONASE) 50 MCG/ACT nasal spray Place 1 spray into both nostrils 2 (two) times daily.    Marland Kitchen gabapentin (NEURONTIN) 100 MG capsule Take 100 mg by mouth 3 (three) times daily.    Marland Kitchen HUMALOG 100 UNIT/ML injection Inject 12 Units into the skin 3 (three) times daily with meals. PER SLIDING SCALE    . HUMALOG 100 UNIT/ML injection Inject 15-25 Units into the skin 3 (three) times daily after meals. Sliding scale    . HYDROcodone-acetaminophen (NORCO/VICODIN) 5-325 MG tablet Take 1 tablet by mouth every 6 (six) hours as needed for moderate pain. 30 tablet 0  . Hypromellose (ARTIFICIAL TEARS OP) Place 1 drop into both eyes 2 (two) times daily as needed (dry eyes).    . insulin glargine (LANTUS) 100 UNIT/ML injection Inject 0.3 mLs (30 Units total) into the skin daily. (Patient taking differently: Inject 38 Units into the skin daily. ) 1 vial 3  . lactulose (CHRONULAC) 10 GM/15ML solution Take 15 mLs (10 g total) by mouth daily as needed for mild constipation. 946 mL 3  . LANTUS 100 UNIT/ML injection Inject 38 Units into the skin daily.    Marland Kitchen levothyroxine (SYNTHROID, LEVOTHROID) 100 MCG tablet  Take 1 tablet (100 mcg total) by mouth every other day. (alternate with the 112 mcg strength) 15 tablet 2  . levothyroxine (SYNTHROID, LEVOTHROID) 100 MCG tablet Take 100 mcg by mouth every other day. Alternate with 137mcg    . levothyroxine (SYNTHROID, LEVOTHROID) 112 MCG tablet Take 1 tablet (112 mcg total) by mouth every other day. (alternate with the 100 mcg strength) 15 tablet 2  . levothyroxine (SYNTHROID, LEVOTHROID) 112 MCG tablet Take 112 mcg by mouth every other day. Alternate with 126mcg    . montelukast (SINGULAIR) 10 MG tablet Take 10 mg by mouth at bedtime.    . montelukast (SINGULAIR) 10 MG tablet Take 10 mg by mouth daily.    . nitroGLYCERIN (NITROSTAT) 0.4 MG SL tablet Place 0.4 mg under the tongue every 5 (five) minutes as needed for chest pain.    Marland Kitchen omeprazole  (PRILOSEC) 20 MG capsule TAKE ONE CAPSULE BY MOUTH TWICE DAILY AS NEEDED (USE MAY INCREASE RISK OF PNEUMONIA,  OSTEOPOROSIS, AND ANEMIA). 60 capsule 0  . omeprazole (PRILOSEC) 20 MG capsule Take 20 mg by mouth 2 (two) times daily.    . promethazine (PHENERGAN) 12.5 MG tablet Take 1 tablet (12.5 mg total) by mouth every 6 (six) hours as needed for nausea or vomiting. 30 tablet 0  . QUEtiapine (SEROQUEL) 25 MG tablet Take 25 mg by mouth at bedtime.     Marland Kitchen QUEtiapine (SEROQUEL) 25 MG tablet Take 25 mg by mouth at bedtime.    Marland Kitchen rOPINIRole (REQUIP) 1 MG tablet Take 1 mg by mouth 3 (three) times daily.    Marland Kitchen rOPINIRole (REQUIP) 1 MG tablet Take 1 mg by mouth 3 (three) times daily.    . sertraline (ZOLOFT) 100 MG tablet Take 150 mg by mouth daily.     . sertraline (ZOLOFT) 100 MG tablet Take 150 mg by mouth at bedtime.    Marland Kitchen SPIRIVA HANDIHALER 18 MCG inhalation capsule Take 1 puff by mouth daily.    Marland Kitchen spironolactone (ALDACTONE) 25 MG tablet Take 25 mg by mouth daily.    . SYMBICORT 160-4.5 MCG/ACT inhaler INHALE TWO PUFFS BY MOUTH TWICE DAILY . RINSE MOUTH AFTER EACH USE 1 Inhaler 11  . SYMBICORT 160-4.5 MCG/ACT inhaler Take 2 puffs by mouth 2 (two) times daily.    Marland Kitchen tiotropium (SPIRIVA HANDIHALER) 18 MCG inhalation capsule Place 18 mcg into inhaler and inhale daily.    Marland Kitchen gabapentin (NEURONTIN) 100 MG capsule Take 1 capsule (100 mg total) by mouth 3 (three) times daily. 90 capsule 3  . predniSONE (STERAPRED UNI-PAK 21 TAB) 10 MG (21) TBPK tablet Take 6 tabs by mouth daily  for 2 days, then 5 tabs for 2 days, then 4 tabs for 2 days, then 3 tabs for 2 days, 2 tabs for 2 days, then 1 tab by mouth daily for 2 days (Patient not taking: Reported on 12/05/2014) 42 tablet 0  . senna (SENOKOT) 8.6 MG TABS tablet Take 2 tablets by mouth at bedtime.      No current facility-administered medications for this visit.    Allergies as of 12/05/2014 - Review Complete 12/05/2014  Allergen Reaction Noted  . Enalapril  Other (See Comments) 03/17/2013  . Vasotec [enalapril]  10/23/2014  . Codeine Nausea And Vomiting 02/22/2014  . Morphine and related Nausea And Vomiting 02/22/2014    ROS:  General: Negative for anorexia, weight loss, fever, chills, fatigue, weakness. ENT: Negative for hoarseness, difficulty swallowing , nasal congestion. CV: Negative for chest pain, angina, palpitations,  dyspnea on exertion, peripheral edema.  Respiratory: Negative for dyspnea at rest, dyspnea on exertion, cough, sputum, wheezing.  GI: See history of present illness. GU:  Negative for dysuria, hematuria, urinary incontinence, urinary frequency, nocturnal urination.  Endo: Negative for unusual weight change.    Physical Examination:   BP 134/73 mmHg  Pulse 60  Temp(Src) 98 F (36.7 C) (Oral)  Ht 4' 11.5" (1.511 m)  Wt 193 lb 6.4 oz (87.726 kg)  BMI 38.42 kg/m2  General: Well-nourished, well-developed in no acute distress.  Eyes: No icterus. Conjunctivae pink. Mouth: Oropharyngeal mucosa moist and pink , no lesions erythema or exudate. Lungs: Bilateral rhonchi Heart: Regular rate and rhythm, no murmurs rubs or gallops.  Abdomen: Bowel sounds are normal, nontender, nondistended, no hepatosplenomegaly or masses, no abdominal bruits or hernia , no rebound or guarding.   Extremities: No lower extremity edema. No clubbing or deformities. Neuro: Alert and oriented x 3.  Grossly intact. Skin: Warm and dry, no jaundice.   Psych: Alert and cooperative, normal mood and affect.  Labs:    Imaging Studies: Dg Op Swallowing Func-medicare/speech Path  11/14/2014  CLINICAL DATA:  Increasing difficulty swallowing over the past month especially with breads and meats, history of previous esophageal dilation. EXAM: MODIFIED BARIUM SWALLOW TECHNIQUE: Different consistencies of barium were administered orally to the patient by the Speech Pathologist. Imaging of the pharynx was performed in the lateral projection. FLUOROSCOPY TIME:   Fluoroscopy Time:  2 minutes, 30 seconds. Number of Acquired Images:  13 video loops COMPARISON:  None in PACs FINDINGS: Thin liquid- within normal limits Nectar thick liquid- within normal limits Pure- within normal limits Pure with cracker- within normal limits IMPRESSION: Normal modified barium swallow examination. Survey views of the lower esophagus revealed no significant retention of the barium preparations. Please refer to the Speech Pathologists report for complete details and recommendations. Electronically Signed   By: David  Martinique M.D.   On: 11/14/2014 13:46   Dg Epidural/nerve Root  11/13/2014  CLINICAL DATA:  Chronic low back pain. Anterolisthesis with uncovering of a broad-based disc protrusion at L4-5 and L5-S1. Bilateral low back and radicular pain. FLUOROSCOPY TIME:  0.799 Gy*cm2 PROCEDURE: SELECTIVE NERVE ROOT AND TRANSFORAMINAL EPIDURAL STEROID INJECTION: A transforaminal approach was performed on the right at the L5-S1 foramen. The overlying skin was cleansed and anesthetized. A 22 gauge spinal needle was advanced into the foramen from a lateral oblique approach. Injection of 2cc of Omnipaque 180 outlined the nerve root and extended into the epidural space. No vascular or intrathecal spread is evident. I then injected 60 mg of Depo-Medrol and 0.75 ml of 1% lidocaine. The patient tolerated the procedure without evidence for complication. A transforaminal approach was performed on the left at the L5-S1 foramen. The overlying skin was cleansed and anesthetized. A 22 gauge spinal needle was advanced into the foramen from a lateral oblique approach. Injection of 2cc of Omnipaque 180 outlined the nerve root and extended into the epidural space. No vascular or intrathecal spread is evident. I then injected 60 mg of Depo-Medrol and 0.75 ml of 1% lidocaine. The patient tolerated the procedure without evidence for complication. The patient was observed for 20 minutes prior to discharge in stable  neurologic condition. IMPRESSION: Technically successful selective nerve root block and transforaminal epidural steroid injection bilaterally at the L5-S1 foramina. Electronically Signed   By: San Morelle M.D.   On: 11/13/2014 11:37   Dg Epidural/nerve Root  11/13/2014  CLINICAL DATA:  Chronic low back  pain. Anterolisthesis with uncovering of a broad-based disc protrusion at L4-5 and L5-S1. Bilateral low back and radicular pain. FLUOROSCOPY TIME:  0.799 Gy*cm2 PROCEDURE: SELECTIVE NERVE ROOT AND TRANSFORAMINAL EPIDURAL STEROID INJECTION: A transforaminal approach was performed on the right at the L5-S1 foramen. The overlying skin was cleansed and anesthetized. A 22 gauge spinal needle was advanced into the foramen from a lateral oblique approach. Injection of 2cc of Omnipaque 180 outlined the nerve root and extended into the epidural space. No vascular or intrathecal spread is evident. I then injected 60 mg of Depo-Medrol and 0.75 ml of 1% lidocaine. The patient tolerated the procedure without evidence for complication. A transforaminal approach was performed on the left at the L5-S1 foramen. The overlying skin was cleansed and anesthetized. A 22 gauge spinal needle was advanced into the foramen from a lateral oblique approach. Injection of 2cc of Omnipaque 180 outlined the nerve root and extended into the epidural space. No vascular or intrathecal spread is evident. I then injected 60 mg of Depo-Medrol and 0.75 ml of 1% lidocaine. The patient tolerated the procedure without evidence for complication. The patient was observed for 20 minutes prior to discharge in stable neurologic condition. IMPRESSION: Technically successful selective nerve root block and transforaminal epidural steroid injection bilaterally at the L5-S1 foramina. Electronically Signed   By: San Morelle M.D.   On: 11/13/2014 11:37   Dg Hip Unilat W Or W/o Pelvis 2-3 Views Left  11/23/2014  CLINICAL DATA:  74 year old female  with left hip pain for 2 months. History of bilateral total hip arthroplasties 8 years ago. EXAM: DG HIP (WITH OR WITHOUT PELVIS) 2-3V LEFT COMPARISON:  09/01/2013 FINDINGS: Bilateral total hip arthroplasties identified without definite complicating features. 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 There is no evidence of acute fracture, subluxation or dislocation. No suspicious focal bony lesions are identified. Degenerative changes within the lower lumbar spine again noted. IMPRESSION: No evidence of acute abnormality. Bilateral total hip arthroplasties without complicating features. Electronically Signed   By: Margarette Canada M.D.   On: 11/23/2014 15:08    Assessment and Plan:   Bonnie Henry is a 74 y.o. y/o female who has dysphagia. The patient had an upper endoscopy last year for dysphagia but she states the dysphagia is different at this time that was last time it has only been worse over the last month. The patient will be set up for repeat EGD. The patient has been explained the plan and agrees with it. If the EGD is negative she may want to consider manometry of the esophagus to see if there is a motility problem. I have discussed risks & benefits which include, but are not limited to, bleeding, infection, perforation & drug reaction.  The patient agrees with this plan & written consent will be obtained.      Note: This dictation was prepared with Dragon dictation along with smaller phrase technology. Any transcriptional errors that result from this process are unintentional.

## 2014-12-07 ENCOUNTER — Other Ambulatory Visit: Payer: Self-pay | Admitting: Family Medicine

## 2014-12-08 DIAGNOSIS — J309 Allergic rhinitis, unspecified: Secondary | ICD-10-CM | POA: Diagnosis not present

## 2014-12-08 DIAGNOSIS — R0609 Other forms of dyspnea: Secondary | ICD-10-CM | POA: Diagnosis not present

## 2014-12-08 DIAGNOSIS — R0902 Hypoxemia: Secondary | ICD-10-CM | POA: Diagnosis not present

## 2014-12-08 DIAGNOSIS — J432 Centrilobular emphysema: Secondary | ICD-10-CM | POA: Diagnosis not present

## 2014-12-11 ENCOUNTER — Encounter: Payer: Self-pay | Admitting: *Deleted

## 2014-12-12 ENCOUNTER — Ambulatory Visit
Admission: RE | Admit: 2014-12-12 | Discharge: 2014-12-12 | Disposition: A | Discharge status: Home / Self Care | Payer: Medicare Other | Source: Ambulatory Visit | Attending: Gastroenterology | Admitting: Gastroenterology

## 2014-12-12 ENCOUNTER — Encounter
Admission: RE | Disposition: A | Discharge status: Home / Self Care | Payer: Self-pay | Source: Ambulatory Visit | Attending: Gastroenterology

## 2014-12-12 ENCOUNTER — Encounter: Payer: Self-pay | Admitting: *Deleted

## 2014-12-12 ENCOUNTER — Ambulatory Visit: Payer: Medicare Other | Admitting: Anesthesiology

## 2014-12-12 DIAGNOSIS — I10 Essential (primary) hypertension: Secondary | ICD-10-CM | POA: Diagnosis not present

## 2014-12-12 DIAGNOSIS — Z794 Long term (current) use of insulin: Secondary | ICD-10-CM | POA: Insufficient documentation

## 2014-12-12 DIAGNOSIS — E119 Type 2 diabetes mellitus without complications: Secondary | ICD-10-CM | POA: Insufficient documentation

## 2014-12-12 DIAGNOSIS — Z7982 Long term (current) use of aspirin: Secondary | ICD-10-CM | POA: Insufficient documentation

## 2014-12-12 DIAGNOSIS — I2510 Atherosclerotic heart disease of native coronary artery without angina pectoris: Secondary | ICD-10-CM | POA: Insufficient documentation

## 2014-12-12 DIAGNOSIS — Z87891 Personal history of nicotine dependence: Secondary | ICD-10-CM | POA: Diagnosis not present

## 2014-12-12 DIAGNOSIS — G4730 Sleep apnea, unspecified: Secondary | ICD-10-CM | POA: Insufficient documentation

## 2014-12-12 DIAGNOSIS — M549 Dorsalgia, unspecified: Secondary | ICD-10-CM | POA: Insufficient documentation

## 2014-12-12 DIAGNOSIS — K319 Disease of stomach and duodenum, unspecified: Secondary | ICD-10-CM | POA: Insufficient documentation

## 2014-12-12 DIAGNOSIS — I252 Old myocardial infarction: Secondary | ICD-10-CM | POA: Diagnosis not present

## 2014-12-12 DIAGNOSIS — Z79899 Other long term (current) drug therapy: Secondary | ICD-10-CM | POA: Diagnosis not present

## 2014-12-12 DIAGNOSIS — J449 Chronic obstructive pulmonary disease, unspecified: Secondary | ICD-10-CM | POA: Diagnosis not present

## 2014-12-12 DIAGNOSIS — K2950 Unspecified chronic gastritis without bleeding: Secondary | ICD-10-CM | POA: Diagnosis not present

## 2014-12-12 DIAGNOSIS — F419 Anxiety disorder, unspecified: Secondary | ICD-10-CM | POA: Insufficient documentation

## 2014-12-12 DIAGNOSIS — I4891 Unspecified atrial fibrillation: Secondary | ICD-10-CM | POA: Insufficient documentation

## 2014-12-12 DIAGNOSIS — Z7951 Long term (current) use of inhaled steroids: Secondary | ICD-10-CM | POA: Insufficient documentation

## 2014-12-12 DIAGNOSIS — R1310 Dysphagia, unspecified: Secondary | ICD-10-CM | POA: Insufficient documentation

## 2014-12-12 DIAGNOSIS — E7800 Pure hypercholesterolemia, unspecified: Secondary | ICD-10-CM | POA: Diagnosis not present

## 2014-12-12 DIAGNOSIS — Z853 Personal history of malignant neoplasm of breast: Secondary | ICD-10-CM | POA: Insufficient documentation

## 2014-12-12 DIAGNOSIS — J45909 Unspecified asthma, uncomplicated: Secondary | ICD-10-CM | POA: Diagnosis not present

## 2014-12-12 DIAGNOSIS — K219 Gastro-esophageal reflux disease without esophagitis: Secondary | ICD-10-CM | POA: Diagnosis not present

## 2014-12-12 DIAGNOSIS — K2970 Gastritis, unspecified, without bleeding: Secondary | ICD-10-CM | POA: Insufficient documentation

## 2014-12-12 HISTORY — PX: ESOPHAGOGASTRODUODENOSCOPY (EGD) WITH PROPOFOL: SHX5813

## 2014-12-12 LAB — GLUCOSE, CAPILLARY: GLUCOSE-CAPILLARY: 129 mg/dL — AB (ref 65–99)

## 2014-12-12 SURGERY — ESOPHAGOGASTRODUODENOSCOPY (EGD) WITH PROPOFOL
Anesthesia: General

## 2014-12-12 MED ORDER — LIDOCAINE HCL (CARDIAC) 20 MG/ML IV SOLN
INTRAVENOUS | Status: DC | PRN
Start: 2014-12-12 — End: 2014-12-12
  Administered 2014-12-12: 60 mg via INTRAVENOUS

## 2014-12-12 MED ORDER — PROPOFOL 10 MG/ML IV BOLUS
INTRAVENOUS | Status: DC | PRN
  Administered 2014-12-12: 40 mg via INTRAVENOUS
  Administered 2014-12-12: 30 mg via INTRAVENOUS
  Administered 2014-12-12: 20 mg via INTRAVENOUS

## 2014-12-12 MED ORDER — SODIUM CHLORIDE 0.9 % IV SOLN
INTRAVENOUS | Status: DC
  Administered 2014-12-12 (×2): via INTRAVENOUS

## 2014-12-12 MED ORDER — MIDAZOLAM HCL 2 MG/2ML IJ SOLN
INTRAMUSCULAR | Status: DC | PRN
  Administered 2014-12-12: 1 mg via INTRAVENOUS

## 2014-12-12 NOTE — H&P (Signed)
[facility]  [address], [place], [place] [address] Phone: [phone] Fax : [phone]  Primary Care Physician:  Enid Derry, MD Primary Gastroenterologist:  Dr. Allen Norris  Pre-Procedure History & Physical: HPI:  Bonnie Henry is a 74 y.o. female is here for an endoscopy.   Past Medical History  Diagnosis Date  . Diabetes mellitus without complication ([place])   . Atrial fibrillation ([place])   . Hypertension   . GERD (gastroesophageal reflux disease)   . CAD (coronary artery disease)   . Hypercholesteremia   . COPD (chronic obstructive pulmonary disease) ([place])   . Morbid obesity ([place])   . Post-surgical hypothyroidism   . Depression, major ([place])   . Neuropathy ([place])   . Asthma   . Sleep apnea     "2015 wore mask; lost weight; told didn't need mask anymore" (02/23/2014)  . Type II diabetes mellitus ([place])   . Anxiety   . Anemia   . History of blood transfusion     "when I had my mastectomy"  . Arthritis     "joints ache all over"   . Chronic back pain   . Breast cancer, left breast ([facility])     S/P mastectomy  . Myocardial infarction ([place])   . Shortness of breath dyspnea   . Heart murmur   . Headache   . History of hiatal hernia     Past Surgical History  Procedure Laterality Date  . Bartholin cyst marsupialization    . Trigger finger release Left   . Cataract extraction w/ intraocular lens  implant, bilateral Bilateral ~ 2011  . Total knee arthroplasty Left 1990's  . Total hip arthroplasty Bilateral 1990's  . Thyroidectomy, partial  1950's  . Shoulder surgery Right     "cleaned it out; no scope"  . Toe surgery Right     "cut piece of bone out so toe didn't dig into my foot"  . Carotid endarterectomy Left 2009  . Hammer toe surgery Left     2nd and 3rd digits  . Tonsillectomy  1950's  . Joint replacement    . Breast biopsy Left   . Mastectomy Left 1990's  . Tubal ligation    . Vaginal hysterectomy      "partial"  . Coronary  angioplasty with stent placement  1990's X 2    "2; 1"  . Cardiac catheterization  2000's  . Left heart catheterization with coronary angiogram N/A 02/24/2014    Procedure: LEFT HEART CATHETERIZATION WITH CORONARY ANGIOGRAM;  Surgeon: Leonie Man, MD;  Location: [facility] CATH LAB;  Service: Cardiovascular;  Laterality: N/A;  . Artery biopsy Right 09/29/2014    Procedure: BIOPSY TEMPORAL ARTERY;  Surgeon: Angelia Mould, MD;  Location: [place];  Service: Vascular;  Laterality: Right;    Prior to Admission medications   Medication Sig Start Date End Date Taking? Authorizing Provider  acetaminophen (TYLENOL) 500 MG tablet Take 1,000 mg by mouth every 6 (six) hours as needed (pain).    Historical Provider, MD  acetylcysteine (MUCOMYST) 20 % nebulizer solution Inhale into the lungs. 11/09/14 11/09/15  Historical Provider, MD  albuterol (PROAIR HFA) 108 (90 BASE) MCG/ACT inhaler Inhale 2 puffs into the lungs every 4 (four) hours as needed for wheezing or shortness of breath.     Historical Provider, MD  albuterol (PROVENTIL) (2.5 MG/3ML) 0.083% nebulizer solution Take 2.5 mg by nebulization every 4 (four) hours as needed for wheezing or shortness of breath.    Historical  Provider, MD  amLODipine (NORVASC) 5 MG tablet Take 5 mg by mouth daily.  05/14/14   Historical Provider, MD  amLODipine (NORVASC) 5 MG tablet Take 5 mg by mouth daily. 10/16/14   Historical Provider, MD  aspirin EC 81 MG tablet Take 81 mg by mouth daily.    Historical Provider, MD  atorvastatin (LIPITOR) 40 MG tablet Take 1 tablet (40 mg total) by mouth at bedtime. 06/28/14   Arnetha Courser, MD  atorvastatin (LIPITOR) 40 MG tablet Take 40 mg by mouth daily. 09/30/14   Historical Provider, MD  bumetanide (BUMEX) 1 MG tablet Take 1 mg by mouth daily. 08/07/14   Historical Provider, MD  busPIRone (BUSPAR) 15 MG tablet Take 15 mg by mouth 2 (two) times daily.  05/09/14   Historical Provider, MD  busPIRone (BUSPAR) 15 MG tablet Take 1 tablet by  mouth 2 (two) times daily. 10/02/14   Historical Provider, MD  carvedilol (COREG) 12.5 MG tablet Take 12.5 mg by mouth 2 (two) times daily with a meal.  01/24/14   Historical Provider, MD  carvedilol (COREG) 12.5 MG tablet Take 1 tablet by mouth 2 (two) times daily. 10/08/14   Historical Provider, MD  escitalopram (LEXAPRO) 10 MG tablet Take 1 tablet by mouth daily. 08/01/14   Historical Provider, MD  fluticasone (FLONASE) 50 MCG/ACT nasal spray Place 1 spray into both nostrils 2 (two) times daily. 10/16/14   Historical Provider, MD  gabapentin (NEURONTIN) 100 MG capsule Take 1 capsule (100 mg total) by mouth 3 (three) times daily. 10/24/14   Arnetha Courser, MD  gabapentin (NEURONTIN) 100 MG capsule Take 100 mg by mouth 3 (three) times daily. 09/30/14   Historical Provider, MD  HUMALOG 100 UNIT/ML injection Inject 12 Units into the skin 3 (three) times daily with meals. PER SLIDING SCALE 06/13/14   Historical Provider, MD  HUMALOG 100 UNIT/ML injection Inject 15-25 Units into the skin 3 (three) times daily after meals. Sliding scale 10/21/14   Historical Provider, MD  HYDROcodone-acetaminophen (NORCO/VICODIN) 5-325 MG tablet Take 1 tablet by mouth every 6 (six) hours as needed for moderate pain. 11/22/14   Arnetha Courser, MD  Hypromellose (ARTIFICIAL TEARS OP) Place 1 drop into both eyes 2 (two) times daily as needed (dry eyes).    Historical Provider, MD  insulin glargine (LANTUS) 100 UNIT/ML injection Inject 0.3 mLs (30 Units total) into the skin daily. Patient taking differently: Inject 38 Units into the skin daily.  09/28/14   Arnetha Courser, MD  lactulose (CHRONULAC) 10 GM/15ML solution Take 15 mLs (10 g total) by mouth daily as needed for mild constipation. 11/22/14   Arnetha Courser, MD  LANTUS 100 UNIT/ML injection Inject 38 Units into the skin daily. 10/21/14   Historical Provider, MD  levothyroxine (SYNTHROID, LEVOTHROID) 100 MCG tablet Take 1 tablet (100 mcg total) by mouth every other day.  (alternate with the 112 mcg strength) 09/08/14   Arnetha Courser, MD  levothyroxine (SYNTHROID, LEVOTHROID) 100 MCG tablet Take 100 mcg by mouth every other day. Alternate with 167mcg 10/08/14   Historical Provider, MD  levothyroxine (SYNTHROID, LEVOTHROID) 112 MCG tablet Take 1 tablet (112 mcg total) by mouth every other day. (alternate with the 100 mcg strength) 09/08/14   Arnetha Courser, MD  levothyroxine (SYNTHROID, LEVOTHROID) 112 MCG tablet Take 112 mcg by mouth every other day. Alternate with 155mcg 10/08/14   Historical Provider, MD  montelukast (SINGULAIR) 10 MG tablet Take 1 tablet (10 mg total) by  mouth daily. 12/07/14   Arnetha Courser, MD  nitroGLYCERIN (NITROSTAT) 0.4 MG SL tablet Place 0.4 mg under the tongue every 5 (five) minutes as needed for chest pain.    Historical Provider, MD  omeprazole (PRILOSEC) 20 MG capsule TAKE ONE CAPSULE BY MOUTH TWICE DAILY AS NEEDED (USE MAY INCREASE RISK OF PNEUMONIA,  OSTEOPOROSIS, AND ANEMIA). 11/20/14   Arnetha Courser, MD  omeprazole (PRILOSEC) 20 MG capsule Take 20 mg by mouth 2 (two) times daily. 10/20/14   Historical Provider, MD  predniSONE (STERAPRED UNI-PAK 21 TAB) 10 MG (21) TBPK tablet Take 6 tabs by mouth daily  for 2 days, then 5 tabs for 2 days, then 4 tabs for 2 days, then 3 tabs for 2 days, 2 tabs for 2 days, then 1 tab by mouth daily for 2 days Patient not taking: Reported on 12/05/2014 10/23/14   Dorie Rank, MD  promethazine (PHENERGAN) 12.5 MG tablet Take 1 tablet (12.5 mg total) by mouth every 6 (six) hours as needed for nausea or vomiting. 09/28/14   Arnetha Courser, MD  QUEtiapine (SEROQUEL) 25 MG tablet Take 25 mg by mouth at bedtime.  05/01/14   Historical Provider, MD  QUEtiapine (SEROQUEL) 25 MG tablet Take 25 mg by mouth at bedtime. 10/02/14   Historical Provider, MD  rOPINIRole (REQUIP) 1 MG tablet Take 1 tablet (1 mg total) by mouth 3 (three) times daily. 12/07/14   Arnetha Courser, MD  senna (SENOKOT) 8.6 MG TABS tablet Take 2 tablets by  mouth at bedtime.     Historical Provider, MD  sertraline (ZOLOFT) 100 MG tablet Take 150 mg by mouth daily.  06/18/14   Historical Provider, MD  sertraline (ZOLOFT) 100 MG tablet Take 150 mg by mouth at bedtime. 10/02/14   Historical Provider, MD  SPIRIVA HANDIHALER 18 MCG inhalation capsule Take 1 puff by mouth daily. 10/16/14   Historical Provider, MD  spironolactone (ALDACTONE) 25 MG tablet Take 25 mg by mouth daily. 08/27/14   Historical Provider, MD  SYMBICORT 160-4.5 MCG/ACT inhaler INHALE TWO PUFFS BY MOUTH TWICE DAILY . RINSE MOUTH AFTER EACH USE 09/20/14   Arnetha Courser, MD  SYMBICORT 160-4.5 MCG/ACT inhaler Take 2 puffs by mouth 2 (two) times daily. 10/16/14   Historical Provider, MD  tiotropium (SPIRIVA HANDIHALER) 18 MCG inhalation capsule Place 18 mcg into inhaler and inhale daily.    Historical Provider, MD    Allergies as of 12/05/2014 - Review Complete 12/05/2014  Allergen Reaction Noted  . Enalapril Other (See Comments) 03/17/2013  . Vasotec [enalapril]  10/23/2014  . Codeine Nausea And Vomiting 02/22/2014  . Morphine and related Nausea And Vomiting 02/22/2014    Family History  Problem Relation Age of Onset  . Arthritis Mother   . Cancer Mother   . Diabetes Mother   . Heart disease Mother   . Hyperlipidemia Mother   . Hypertension Mother   . Alcohol abuse Father   . Alcohol abuse Brother   . Arthritis Brother   . Asthma Brother   . HIV Brother   . Cancer Brother   . Diabetes Brother   . Hyperlipidemia Brother   . Hypertension Brother   . Lung disease Brother   . Arthritis Maternal Grandmother   . Brain cancer Father     Social History   Social History  . Marital Status: Widowed    Spouse Name: N/A  . Number of Children: N/A  . Years of Education: N/A   Occupational  History  . Not on file.   Social History Main Topics  . Smoking status: Former Smoker -- 1.50 packs/day for 44 years    Types: Cigarettes    Quit date: 01/06/1994  . Smokeless tobacco:  Never Used     Comment: "quit smoking cigarettes in ~ 2000"  . Alcohol Use: No     Comment: 02/23/2014 "no alcohol since 1990's"  . Drug Use: No  . Sexual Activity: No   Other Topics Concern  . Not on file   Social History Narrative   ** Merged History Encounter **        Review of Systems: See HPI, otherwise negative ROS  Physical Exam: BP 142/73 mmHg  Pulse 66  Temp(Src) 98.3 F (36.8 C) (Tympanic)  Resp 22  SpO2 98% General:   Alert,  pleasant and cooperative in NAD Head:  Normocephalic and atraumatic. Neck:  Supple; no masses or thyromegaly. Lungs:  Clear throughout to auscultation.    Heart:  Regular rate and rhythm. Abdomen:  Soft, nontender and nondistended. Normal bowel sounds, without guarding, and without rebound.   Neurologic:  Alert and  oriented x4;  grossly normal neurologically.  Impression/Plan: Bonnie Henry is here for an endoscopy to be performed for dysphagia  Risks, benefits, limitations, and alternatives regarding  endoscopy have been reviewed with the patient.  Questions have been answered.  All parties agreeable.   Ollen Bowl, MD  12/12/2014, 10:34 AM

## 2014-12-12 NOTE — Anesthesia Postprocedure Evaluation (Signed)
Anesthesia Post Note  Patient: Bonnie Henry  Procedure(s) Performed: Procedure(s) (LRB): ESOPHAGOGASTRODUODENOSCOPY (EGD) WITH PROPOFOL (N/A)  Patient location during evaluation: PACU Anesthesia Type: General Level of consciousness: awake and alert Pain management: pain level controlled Vital Signs Assessment: post-procedure vital signs reviewed and stable Respiratory status: spontaneous breathing, nonlabored ventilation, respiratory function stable and patient connected to nasal cannula oxygen Cardiovascular status: blood pressure returned to baseline and stable Postop Assessment: no signs of nausea or vomiting Anesthetic complications: no    Last Vitals:  Filed Vitals:   12/12/14 1120 12/12/14 1130  BP: 114/55 158/64  Pulse:  66  Temp:    Resp: 12 14    Last Pain: There were no vitals filed for this visit.               Molli Barrows

## 2014-12-12 NOTE — Op Note (Signed)
[facility] Patient Name: Bonnie Henry Procedure Date: 12/12/2014 10:39 AM MRN: [id number] Account #: [id number] Date of Birth: [birth year] Admit Type: Outpatient Age: 74 Room: [facility] ENDO ROOM 4 Gender: Female Note Status: Finalized Procedure:         Upper GI endoscopy Indications:       Dysphagia Providers:         Lucilla Lame, MD Referring MD:      Arnetha Courser (Referring MD) Medicines:         Propofol per Anesthesia Complications:     No immediate complications. Procedure:         Pre-Anesthesia Assessment:                    - Prior to the procedure, a History and Physical was                     performed, and patient medications and allergies were                     reviewed. The patient's tolerance of previous anesthesia                     was also reviewed. The risks and benefits of the procedure                     and the sedation options and risks were discussed with the                     patient. All questions were answered, and informed consent                     was obtained. Prior Anticoagulants: The patient has taken                     no previous anticoagulant or antiplatelet agents. ASA                     Grade Assessment: II - A patient with mild systemic                     disease. After reviewing the risks and benefits, the                     patient was deemed in satisfactory condition to undergo                     the procedure.                    After obtaining informed consent, the endoscope was passed                     under direct vision. Throughout the procedure, the                     patient's blood pressure, pulse, and oxygen saturations                     were monitored continuously. The Endoscope was introduced                     through the mouth, and advanced to the second part of  duodenum. The upper GI endoscopy was accomplished without                     difficulty.  The patient tolerated the procedure well. Findings:      The examined esophagus was normal. A TTS dilator was passed through the       scope. Dilation with a 15-16.5-18 mm balloon (to a maximum balloon size       of 18 mm) dilator was performed. The dilation site was examined       following endoscope reinsertion and showed no change.      Localized moderate inflammation characterized by erythema was found in       the gastric antrum. Biopsies were taken with a cold forceps for       histology.      The examined duodenum was normal. Impression:        - Normal esophagus. Dilated.                    - Gastritis. Biopsied.                    - Normal examined duodenum. Recommendation:    - Await pathology results. Procedure Code(s): --- Professional ---                    [phone], Esophagogastroduodenoscopy, flexible, transoral;                     with transendoscopic balloon dilation of esophagus (less                     than 30 mm diameter)                    43239, Esophagogastroduodenoscopy, flexible, transoral;                     with biopsy, single or multiple Diagnosis Code(s): --- Professional ---                    R13.10, Dysphagia, unspecified                    K29.70, Gastritis, unspecified, without bleeding CPT copyright 2014 American Medical Association. All rights reserved. The codes documented in this report are preliminary and upon coder review may  be revised to meet current compliance requirements. Lucilla Lame, MD 12/12/2014 10:58:21 AM This report has been signed electronically. Number of Addenda: 0 Note Initiated On: 12/12/2014 10:39 AM      [facility]

## 2014-12-12 NOTE — Transfer of Care (Signed)
Immediate Anesthesia Transfer of Care Note  Patient: Bonnie Henry  Procedure(s) Performed: Procedure(s): ESOPHAGOGASTRODUODENOSCOPY (EGD) WITH PROPOFOL (N/A)  Patient Location: Endoscopy Unit  Anesthesia Type:General  Level of Consciousness: awake, alert , oriented and patient cooperative  Airway & Oxygen Therapy: Patient Spontanous Breathing and Patient connected to nasal cannula oxygen  Post-op Assessment: Report given to RN, Post -op Vital signs reviewed and stable and Patient moving all extremities X 4  Post vital signs: Reviewed and stable  Last Vitals:  Filed Vitals:   12/12/14 0943  BP: 142/73  Pulse: 66  Temp: 36.8 C  Resp: 22    Complications: No apparent anesthesia complications

## 2014-12-12 NOTE — Anesthesia Preprocedure Evaluation (Signed)
Anesthesia Evaluation  Patient identified by MRN, date of birth, ID band Patient awake    Reviewed: Allergy & Precautions, H&P , NPO status , Patient's Chart, lab work & pertinent test results, reviewed documented beta blocker date and time   Airway Mallampati: II  TM Distance: >3 FB Neck ROM: full    Dental no notable dental hx.    Pulmonary neg pulmonary ROS, shortness of breath, asthma , sleep apnea , COPD, former smoker,    Pulmonary exam normal breath sounds clear to auscultation       Cardiovascular Exercise Tolerance: Good hypertension, + angina + CAD, + Past MI and + Peripheral Vascular Disease  negative cardio ROS  Atrial Fibrillation + Valvular Problems/Murmurs  Rhythm:regular Rate:Normal     Neuro/Psych  Headaches, PSYCHIATRIC DISORDERS Anxiety Depression  Neuromuscular disease negative neurological ROS  negative psych ROS   GI/Hepatic negative GI ROS, Neg liver ROS, hiatal hernia, GERD  ,  Endo/Other  negative endocrine ROSdiabetesHypothyroidism   Renal/GU Renal diseasenegative Renal ROS  negative genitourinary   Musculoskeletal   Abdominal   Peds  Hematology negative hematology ROS (+) anemia ,   Anesthesia Other Findings   Reproductive/Obstetrics negative OB ROS                             Anesthesia Physical Anesthesia Plan  ASA: IV  Anesthesia Plan: General   Post-op Pain Management:    Induction:   Airway Management Planned:   Additional Equipment:   Intra-op Plan:   Post-operative Plan:   Informed Consent: I have reviewed the patients History and Physical, chart, labs and discussed the procedure including the risks, benefits and alternatives for the proposed anesthesia with the patient or authorized representative who has indicated his/her understanding and acceptance.   Dental Advisory Given  Plan Discussed with: CRNA  Anesthesia Plan Comments:          Anesthesia Quick Evaluation

## 2014-12-13 ENCOUNTER — Encounter: Payer: Self-pay | Admitting: Gastroenterology

## 2014-12-13 LAB — SURGICAL PATHOLOGY

## 2014-12-14 ENCOUNTER — Encounter: Payer: Self-pay | Admitting: Gastroenterology

## 2014-12-21 ENCOUNTER — Other Ambulatory Visit: Payer: Self-pay | Admitting: Family Medicine

## 2014-12-21 NOTE — Telephone Encounter (Signed)
rx approved

## 2014-12-27 DIAGNOSIS — E119 Type 2 diabetes mellitus without complications: Secondary | ICD-10-CM | POA: Diagnosis not present

## 2015-01-03 DIAGNOSIS — E1165 Type 2 diabetes mellitus with hyperglycemia: Secondary | ICD-10-CM | POA: Diagnosis not present

## 2015-01-03 DIAGNOSIS — E039 Hypothyroidism, unspecified: Secondary | ICD-10-CM | POA: Diagnosis not present

## 2015-01-03 DIAGNOSIS — E1140 Type 2 diabetes mellitus with diabetic neuropathy, unspecified: Secondary | ICD-10-CM | POA: Diagnosis not present

## 2015-01-20 ENCOUNTER — Other Ambulatory Visit: Payer: Self-pay | Admitting: Family Medicine

## 2015-01-22 ENCOUNTER — Ambulatory Visit (INDEPENDENT_AMBULATORY_CARE_PROVIDER_SITE_OTHER): Payer: Medicare Other | Admitting: Family Medicine

## 2015-01-22 ENCOUNTER — Encounter: Payer: Self-pay | Admitting: Family Medicine

## 2015-01-22 VITALS — BP 116/68 | HR 60 | Temp 97.2°F | Wt 194.0 lb

## 2015-01-22 DIAGNOSIS — R296 Repeated falls: Secondary | ICD-10-CM | POA: Diagnosis not present

## 2015-01-22 DIAGNOSIS — G545 Neuralgic amyotrophy: Secondary | ICD-10-CM | POA: Diagnosis not present

## 2015-01-22 DIAGNOSIS — G8929 Other chronic pain: Secondary | ICD-10-CM

## 2015-01-22 DIAGNOSIS — F32A Depression, unspecified: Secondary | ICD-10-CM

## 2015-01-22 DIAGNOSIS — F329 Major depressive disorder, single episode, unspecified: Secondary | ICD-10-CM | POA: Diagnosis not present

## 2015-01-22 MED ORDER — BUSPIRONE HCL 15 MG PO TABS: 15.0000 mg | ORAL_TABLET | Freq: Two times a day (BID) | ORAL | Status: DC

## 2015-01-22 MED ORDER — SERTRALINE HCL 100 MG PO TABS: 150.0000 mg | ORAL_TABLET | Freq: Every day | ORAL | Status: DC

## 2015-01-22 MED ORDER — QUETIAPINE FUMARATE 25 MG PO TABS: 12.5000 mg | ORAL_TABLET | Freq: Every day | ORAL | Status: DC

## 2015-01-22 MED ORDER — HYDROCODONE-ACETAMINOPHEN 5-325 MG PO TABS: 1.0000 | ORAL_TABLET | Freq: Four times a day (QID) | ORAL | Status: DC | PRN

## 2015-01-22 NOTE — Progress Notes (Signed)
BP 116/68 mmHg  Pulse 60  Temp(Src) 97.2 F (36.2 C)  Wt 194 lb (87.998 kg)  SpO2 99%   Subjective:    Patient ID: Bonnie Henry, female    DOB: September 19, 1940, 75 y.o.   MRN: [id number]  HPI: Bonnie Henry is a 75 y.o. female  Chief Complaint  Patient presents with  . Depression    She is no longer going to the doctor that prescribed her depression meds.  . Immunizations    She is interested in the shingles vaccine  Patient is here with her daughter She is having a lot of pain in her neck and shoulders; having severe headaches; feelings She saw the rheumatologist about her shoulder girdle pain; I had started her on prednisone, and he stopped it; he referred her to the pain clinic, and she had a shot in her back; she was supposed to go back and re-evaluate the pain in her neck and shoulders; feels pain along the left side shoulder and it goes up the neck and into the head Her pillow puts pressure on the neck She sees Dr. Sharlet Salina for pain; they did not talk about pain medicine at all; back is hurting now; pain right now 10 out of 10; the last pain medicine worked the best; it did not make her feel loopy or goofy or drunk, a little tired; I personally spoke with Caryl Pina at pain clinic and she says no problem for me to prescribe pain medicine This pain is about the worst it's been for a while  Depression screen [facility] 2/9 01/22/2015  Decreased Interest 3  Down, Depressed, Hopeless 0  PHQ - 2 Score 3  Altered sleeping 3  Tired, decreased energy 1  Change in appetite 0  Feeling bad or failure about yourself  0  Trouble concentrating 0  Moving slowly or fidgety/restless 0  Suicidal thoughts 0  PHQ-9 Score 7  Difficult doing work/chores Somewhat difficult   She is pleased with the current doses of three medicines: sertraline 150 mg daily, buspirone 15 mg BID, seroquel 25 mg half of a pill at bedtime; these work well; I discussed low but possible risk of heart attack and  stroke  Relevant past medical, surgical, family and social history reviewed and updated as indicated. Interim medical history since our last visit reviewed. Allergies and medications reviewed and updated.  Review of Systems Per HPI unless specifically indicated above     Objective:    BP 116/68 mmHg  Pulse 60  Temp(Src) 97.2 F (36.2 C)  Wt 194 lb (87.998 kg)  SpO2 99%  Wt Readings from Last 3 Encounters:  01/22/15 194 lb (87.998 kg)  12/05/14 193 lb 6.4 oz (87.726 kg)  11/22/14 192 lb (87.091 kg)    Physical Exam  Constitutional: She appears well-developed and well-nourished.  Elderly, appears chronically ill; deconditioned, older than stated age; nontoxic; seated in wheelchair; gait not assessed  Cardiovascular: Normal rate and regular rhythm.   Pulmonary/Chest: Effort normal and breath sounds normal.  Abdominal: She exhibits no distension.  Musculoskeletal:       Left hip: She exhibits tenderness.  Tender to palpation over left trapezius, shoulder; no deformity  Neurological:  Gait not assessed; seated in wheelchair  Psychiatric: She has a normal mood and affect. Her affect is not blunt. She does not exhibit a depressed mood.  Good eye contact with examiner   Results for orders placed or performed during the hospital encounter of 12/12/14  Glucose, capillary  Result Value Ref Range   Glucose-Capillary 129 (H) 65 - 99 mg/dL  Surgical pathology  Result Value Ref Range   SURGICAL PATHOLOGY      Surgical Pathology CASE: [id number] PATIENT: Eris Malta Surgical Pathology Report     SPECIMEN SUBMITTED: A. Stomach, antrum, cbxs  CLINICAL HISTORY: None provided  PRE-OPERATIVE DIAGNOSIS: Dysphagia R13.10  POST-OPERATIVE DIAGNOSIS: Gastritis     DIAGNOSIS: A. STOMACH, ANTRUM; COLD BIOPSY: - MODERATE REACTIVE GASTROPATHY. - NEGATIVE FOR ACTIVE INFLAMMATION, INTESTINAL METAPLASIA, DYSPLASIA, AND MALIGNANCY. - NEGATIVE FOR HELICOBACTER PYLORI IN  HEMATOXYLIN AND EOSIN SECTIONS.   GROSS DESCRIPTION: A. Labeled: antrum C biopsy Tissue fragment(s): 2 Size: 0.3 cm Description: pink  Entirely submitted in one cassette(s).  Final Diagnosis performed by Bryan Lemma, MD.  Electronically signed 12/13/2014 1:40:58PM    The electronic signature indicates that the named Attending Pathologist has evaluated the specimen  Technical component performed at [facility], [address], [place], [place] [address] Lab: [phone] Dir: Darrick Penna. Evette Doffing, MD   Professional component performed at [facility], [facility], [place], [place], [place] [address] Lab: [phone] Dir: Dellia Nims. Rubinas, MD        Assessment & Plan:   Problem List Items Addressed This Visit      Nervous and Auditory   Shoulder girdle syndrome    I sent her to rheumatologist; they discontinued the prednisone; she continues to have significant pain in this area; she will follow-up with pain clinic and I'll prescribe medicine to try to help ameliorate pain in the meantime      Relevant Medications   busPIRone (BUSPAR) 15 MG tablet   QUEtiapine (SEROQUEL) 25 MG tablet   sertraline (ZOLOFT) 100 MG tablet     Other   Chronic pain    Patient was sent from here to rheumatologist, and then sent from there to pain clinic; they are not giving her any narcotics she says; I called pain clinic personally to see if okay if I treat her pain with narcotics and staff there said no problem, would not cause any issues for them; I wanted to make sure we wouldn't burn a bridge with the pain clinic; patient's quality of life is suffering because of her pain; start back on hydrocodone; one or two a day and then we can gradually increase if needed      Relevant Medications   sertraline (ZOLOFT) 100 MG tablet   HYDROcodone-acetaminophen (NORCO/VICODIN) 5-325 MG tablet   Clinical depression    Talked with patient and her daughter about the risk of heart attack,  stroke with the atypical antipsychotic; patient does not want to go back to psychiatrist at this time and reports she is feeling better; I will refill medicines which are currently working for patient      Relevant Medications   busPIRone (BUSPAR) 15 MG tablet   sertraline (ZOLOFT) 100 MG tablet   Frequent falls - Primary    Refer to [facility] in [place]; stressed importance of preventing a fall which could result in hip fracture, other injury      Relevant Orders   Ambulatory referral to Physical Therapy       Follow up plan: Return in about 4 weeks (around 02/19/2015).  Meds ordered this encounter  Medications  . busPIRone (BUSPAR) 15 MG tablet    Sig: Take 1 tablet (15 mg total) by mouth 2 (two) times daily.    Dispense:  60 tablet    Refill:  6  . QUEtiapine (SEROQUEL)  25 MG tablet    Sig: Take 0.5 tablets (12.5 mg total) by mouth at bedtime.    Dispense:  15 tablet    Refill:  6  . sertraline (ZOLOFT) 100 MG tablet    Sig: Take 1.5 tablets (150 mg total) by mouth daily.    Dispense:  45 tablet    Refill:  6  . HYDROcodone-acetaminophen (NORCO/VICODIN) 5-325 MG tablet    Sig: Take 1 tablet by mouth every 6 (six) hours as needed for moderate pain.    Dispense:  45 tablet    Refill:  0   An after-visit summary was printed and given to the patient at [place].  Please see the patient instructions which may contain other information and recommendations beyond what is mentioned above in the assessment and plan.  Face-to-face time with patient was more than 25 minutes, >50% time spent counseling and coordination of care

## 2015-01-22 NOTE — Patient Instructions (Addendum)
Your next appt with Dr. Sharlet Salina is January 24th at 9:30 am Use the pain medicine if needed, once or twice a day to start with and we can increase if needed Try turmeric as a natural anti-inflammatory (for pain and arthritis). It comes in capsules where you buy aspirin and fish oil, but also as a spice where you buy pepper and garlic powder.

## 2015-01-23 DIAGNOSIS — L603 Nail dystrophy: Secondary | ICD-10-CM | POA: Diagnosis not present

## 2015-01-23 DIAGNOSIS — E1051 Type 1 diabetes mellitus with diabetic peripheral angiopathy without gangrene: Secondary | ICD-10-CM | POA: Diagnosis not present

## 2015-01-23 DIAGNOSIS — I739 Peripheral vascular disease, unspecified: Secondary | ICD-10-CM | POA: Diagnosis not present

## 2015-01-23 NOTE — Assessment & Plan Note (Signed)
Refer to [facility] in [place]; stressed importance of preventing a fall which could result in hip fracture, other injury

## 2015-01-23 NOTE — Assessment & Plan Note (Signed)
I sent her to rheumatologist; they discontinued the prednisone; she continues to have significant pain in this area; she will follow-up with pain clinic and I'll prescribe medicine to try to help ameliorate pain in the meantime

## 2015-01-23 NOTE — Assessment & Plan Note (Signed)
Patient was sent from here to rheumatologist, and then sent from there to pain clinic; they are not giving her any narcotics she says; I called pain clinic personally to see if okay if I treat her pain with narcotics and staff there said no problem, would not cause any issues for them; I wanted to make sure we wouldn't burn a bridge with the pain clinic; patient's quality of life is suffering because of her pain; start back on hydrocodone; one or two a day and then we can gradually increase if needed

## 2015-01-23 NOTE — Assessment & Plan Note (Signed)
Talked with patient and her daughter about the risk of heart attack, stroke with the atypical antipsychotic; patient does not want to go back to psychiatrist at this time and reports she is feeling better; I will refill medicines which are currently working for patient

## 2015-01-26 ENCOUNTER — Other Ambulatory Visit: Payer: Self-pay | Admitting: Family Medicine

## 2015-01-26 NOTE — Telephone Encounter (Signed)
Patient sees endocrinologist, Dr. Graceann Congress; Rx declined, sent back to pharm

## 2015-01-30 ENCOUNTER — Other Ambulatory Visit: Payer: Self-pay | Admitting: Physical Medicine and Rehabilitation

## 2015-01-30 DIAGNOSIS — M5416 Radiculopathy, lumbar region: Secondary | ICD-10-CM | POA: Diagnosis not present

## 2015-01-30 DIAGNOSIS — M5136 Other intervertebral disc degeneration, lumbar region: Secondary | ICD-10-CM | POA: Diagnosis not present

## 2015-01-30 DIAGNOSIS — M5030 Other cervical disc degeneration, unspecified cervical region: Secondary | ICD-10-CM | POA: Diagnosis not present

## 2015-01-30 DIAGNOSIS — M5412 Radiculopathy, cervical region: Secondary | ICD-10-CM | POA: Diagnosis not present

## 2015-01-30 DIAGNOSIS — M4806 Spinal stenosis, lumbar region: Secondary | ICD-10-CM | POA: Diagnosis not present

## 2015-02-05 ENCOUNTER — Other Ambulatory Visit: Payer: Self-pay | Admitting: Family Medicine

## 2015-02-06 NOTE — Telephone Encounter (Signed)
ALT was 18 in August 2016; Rx approved

## 2015-02-08 ENCOUNTER — Ambulatory Visit (INDEPENDENT_AMBULATORY_CARE_PROVIDER_SITE_OTHER): Payer: Medicare Other | Admitting: Family Medicine

## 2015-02-08 ENCOUNTER — Ambulatory Visit
Admission: RE | Admit: 2015-02-08 | Discharge: 2015-02-08 | Disposition: A | Discharge status: Home / Self Care | Payer: Medicare Other | Source: Ambulatory Visit | Attending: Family Medicine | Admitting: Family Medicine

## 2015-02-08 ENCOUNTER — Encounter: Payer: Self-pay | Admitting: Family Medicine

## 2015-02-08 VITALS — BP 117/68 | HR 57 | Temp 97.1°F | Wt 195.0 lb

## 2015-02-08 DIAGNOSIS — M25551 Pain in right hip: Secondary | ICD-10-CM | POA: Diagnosis not present

## 2015-02-08 DIAGNOSIS — M79651 Pain in right thigh: Secondary | ICD-10-CM | POA: Diagnosis not present

## 2015-02-08 DIAGNOSIS — W19XXXA Unspecified fall, initial encounter: Secondary | ICD-10-CM

## 2015-02-08 DIAGNOSIS — M79659 Pain in unspecified thigh: Secondary | ICD-10-CM | POA: Insufficient documentation

## 2015-02-08 DIAGNOSIS — M25561 Pain in right knee: Secondary | ICD-10-CM

## 2015-02-08 DIAGNOSIS — S41101A Unspecified open wound of right upper arm, initial encounter: Secondary | ICD-10-CM

## 2015-02-08 DIAGNOSIS — M25562 Pain in left knee: Secondary | ICD-10-CM | POA: Insufficient documentation

## 2015-02-08 DIAGNOSIS — M7989 Other specified soft tissue disorders: Secondary | ICD-10-CM | POA: Diagnosis not present

## 2015-02-08 DIAGNOSIS — S41111A Laceration without foreign body of right upper arm, initial encounter: Secondary | ICD-10-CM

## 2015-02-08 MED ORDER — NAPROXEN 375 MG PO TABS: 375.0000 mg | ORAL_TABLET | Freq: Two times a day (BID) | ORAL | Status: DC

## 2015-02-08 NOTE — Progress Notes (Signed)
  BP 117/68 mmHg  Pulse 57  Temp(Src) 97.1 F (36.2 C)  Wt 195 lb (88.451 kg)  SpO2 100%   Subjective:    Patient ID: Bonnie Henry, female    DOB: 12-05-1940, 75 y.o.   MRN: [id number]  HPI: Bonnie Henry is a 75 y.o. female  Chief Complaint  Patient presents with  . Swollen Leg    right leg; fell a week ago, not sure if it was from the fall or just swelling.   . Immunizations    She needs an rx for the shingles vaccine to take to her pharmacy   She has a swollen painful leg (right) Going on for long than a week She fell 4-5 times over the last month, tore skin on her right arm Patient is here with her daughter  Relevant past medical, surgical, family and social history reviewed and updated as indicated. Interim medical history since our last visit reviewed. Allergies and medications reviewed and updated.  Review of Systems Per HPI unless specifically indicated above     Objective:    BP 117/68 mmHg  Pulse 57  Temp(Src) 97.1 F (36.2 C)  Wt 195 lb (88.451 kg)  SpO2 100%  Wt Readings from Last 3 Encounters:  02/20/15 191 lb 9.6 oz (86.909 kg)  02/08/15 195 lb (88.451 kg)  01/22/15 194 lb (87.998 kg)    Physical Exam  Constitutional: She appears well-developed and well-nourished.  Obese female  Swelling over the medial right knee Bruising all along the lateral right leg Scab over the left knee Tender over the right greater trochanter Foot exam, some edema dorsum of right distal foot Wounds on right arm dressed and bandages removed Flap wound about 8 cm in longest dimension on right arm     Assessment & Plan:   Problem List Items Addressed This Visit      Musculoskeletal and Integument   Skin tear of right upper arm without complication    Cleaned and dressed        Other   Fall - Primary   Relevant Orders   Ambulatory referral to Physical Therapy   DG HIP UNILAT WITH PELVIS 2-3 VIEWS RIGHT (Completed)   DG FEMUR, MIN 2 VIEWS RIGHT  (Completed)   DG Knee Complete 4 Views Right (Completed)   Knee pain, bilateral   Relevant Orders   DG Knee Complete 4 Views Left (Completed)   DG Knee Complete 4 Views Right (Completed)   Acute right hip pain   Relevant Orders   DG HIP UNILAT WITH PELVIS 2-3 VIEWS RIGHT (Completed)   Acute thigh pain   Relevant Orders   DG FEMUR, MIN 2 VIEWS RIGHT (Completed)      Follow up plan: Return in about 2 weeks (around 02/22/2015) for follow-up, already scheduled.  [phone] cell phone, daughter  Orders Placed This Encounter  Procedures  . DG Knee Complete 4 Views Left  . DG HIP UNILAT WITH PELVIS 2-3 VIEWS RIGHT  . DG FEMUR, MIN 2 VIEWS RIGHT  . DG Knee Complete 4 Views Right  . Ambulatory referral to Physical Therapy

## 2015-02-08 NOTE — Patient Instructions (Addendum)
Go from here to [facility] refer you to the physical therapist in [place] Keep wounds clean and covered Watch for signs of infection Elevate those legs Avoid salt Start naproxen twice a day; take aspirin one hour prior to one of these pills Okay to use tylenol per package directions No other NSAIDs Keep next appt

## 2015-02-20 ENCOUNTER — Encounter: Payer: Self-pay | Admitting: Family Medicine

## 2015-02-20 ENCOUNTER — Other Ambulatory Visit: Payer: Self-pay | Admitting: Physical Medicine and Rehabilitation

## 2015-02-20 ENCOUNTER — Ambulatory Visit (INDEPENDENT_AMBULATORY_CARE_PROVIDER_SITE_OTHER): Payer: Medicare Other | Admitting: Family Medicine

## 2015-02-20 ENCOUNTER — Other Ambulatory Visit: Payer: Self-pay | Admitting: Family Medicine

## 2015-02-20 ENCOUNTER — Telehealth: Payer: Self-pay | Admitting: Family Medicine

## 2015-02-20 ENCOUNTER — Ambulatory Visit
Admission: RE | Admit: 2015-02-20 | Discharge: 2015-02-20 | Disposition: A | Discharge status: Home / Self Care | Payer: Medicare Other | Source: Ambulatory Visit | Attending: Physical Medicine and Rehabilitation | Admitting: Physical Medicine and Rehabilitation

## 2015-02-20 VITALS — BP 123/58 | HR 67 | Temp 98.1°F | Wt 191.6 lb

## 2015-02-20 DIAGNOSIS — G8929 Other chronic pain: Secondary | ICD-10-CM

## 2015-02-20 DIAGNOSIS — Z5181 Encounter for therapeutic drug level monitoring: Secondary | ICD-10-CM | POA: Diagnosis not present

## 2015-02-20 DIAGNOSIS — M50323 Other cervical disc degeneration at C6-C7 level: Secondary | ICD-10-CM | POA: Insufficient documentation

## 2015-02-20 DIAGNOSIS — E669 Obesity, unspecified: Secondary | ICD-10-CM

## 2015-02-20 DIAGNOSIS — J432 Centrilobular emphysema: Secondary | ICD-10-CM

## 2015-02-20 DIAGNOSIS — D6489 Other specified anemias: Secondary | ICD-10-CM

## 2015-02-20 DIAGNOSIS — M50322 Other cervical disc degeneration at C5-C6 level: Secondary | ICD-10-CM | POA: Insufficient documentation

## 2015-02-20 DIAGNOSIS — E1121 Type 2 diabetes mellitus with diabetic nephropathy: Secondary | ICD-10-CM

## 2015-02-20 DIAGNOSIS — S41101A Unspecified open wound of right upper arm, initial encounter: Secondary | ICD-10-CM

## 2015-02-20 DIAGNOSIS — M50321 Other cervical disc degeneration at C4-C5 level: Secondary | ICD-10-CM | POA: Insufficient documentation

## 2015-02-20 DIAGNOSIS — E038 Other specified hypothyroidism: Secondary | ICD-10-CM | POA: Diagnosis not present

## 2015-02-20 DIAGNOSIS — K5909 Other constipation: Secondary | ICD-10-CM

## 2015-02-20 DIAGNOSIS — R1012 Left upper quadrant pain: Secondary | ICD-10-CM | POA: Diagnosis not present

## 2015-02-20 DIAGNOSIS — R109 Unspecified abdominal pain: Secondary | ICD-10-CM

## 2015-02-20 DIAGNOSIS — S41111A Laceration without foreign body of right upper arm, initial encounter: Secondary | ICD-10-CM

## 2015-02-20 DIAGNOSIS — M25551 Pain in right hip: Secondary | ICD-10-CM | POA: Diagnosis not present

## 2015-02-20 DIAGNOSIS — M545 Low back pain, unspecified: Secondary | ICD-10-CM

## 2015-02-20 DIAGNOSIS — M5412 Radiculopathy, cervical region: Secondary | ICD-10-CM

## 2015-02-20 DIAGNOSIS — E7800 Pure hypercholesterolemia, unspecified: Secondary | ICD-10-CM

## 2015-02-20 DIAGNOSIS — M4722 Other spondylosis with radiculopathy, cervical region: Secondary | ICD-10-CM | POA: Diagnosis not present

## 2015-02-20 DIAGNOSIS — M47812 Spondylosis without myelopathy or radiculopathy, cervical region: Secondary | ICD-10-CM | POA: Diagnosis not present

## 2015-02-20 MED ORDER — LACTULOSE 10 GM/15ML PO SOLN: 10.0000 g | Freq: Two times a day (BID) | ORAL | Status: DC | PRN

## 2015-02-20 MED ORDER — DOXYCYCLINE HYCLATE 100 MG PO TABS: 100.0000 mg | ORAL_TABLET | Freq: Two times a day (BID) | ORAL | Status: DC

## 2015-02-20 NOTE — Telephone Encounter (Signed)
I called, left msg that patient appears to have a bladder infection; start antibiotics; reviewed GFR from October; I don't have more recent one, so I'm going to err on side of making sure no renal damage with my ABX; culture pending Explained medicine going to pharmacy in [place]

## 2015-02-20 NOTE — Assessment & Plan Note (Signed)
Check lipids today 

## 2015-02-20 NOTE — Assessment & Plan Note (Signed)
Managed by Dr. Fleming 

## 2015-02-20 NOTE — Assessment & Plan Note (Signed)
Check CBC 

## 2015-02-20 NOTE — Patient Instructions (Addendum)
  Try taking vitamin C 500 or 1000 mg daily for a month to see if that helps your skin Increase the lactulose from once a day to twice a day Please let me know if you would like an MRI of the right knee or if you would like to see an orthopaedist Let's get labs and urine today If the left flank/back pain gets worse or doesn't go away in a week or two, then let me know Stop the naproxen

## 2015-02-20 NOTE — Progress Notes (Signed)
BP 123/58 mmHg  Pulse 67  Temp(Src) 98.1 F (36.7 C)  Ht   Wt 191 lb 9.6 oz (86.909 kg)  SpO2 99%   Subjective:    Patient ID: Bonnie Henry, female    DOB: Oct 31, 1940, 75 y.o.   MRN: [id number]  HPI: Bonnie Henry is a 75 y.o. female  Chief Complaint  Patient presents with  . Follow-up  . Constipation    From pain medication  . Knee Pain  . Leg Swelling   She has easy bruising and skin tears  She has significant constipation; quit taking pain pills because of it; getting enough fiber; drinks water all day long; taking [place]; taking one tablespoon once a day; she is on diuretic, not really helping she says with leg swelling  Last TSH here was elevated 5 months ago; now followed by endo  Ref Range 23mo ago  40mo ago  53mo ago      TSH 0.450 - 4.500 uIU/mL 4.970 (H) 0.208 (L) 0.171 (L)R   Resulting Agency  [facility]        Outside labs from endo: TSH Nov 09, 2014: 1.985, free T4 1.13 Taking 100 mcg Monday through Thursday, 112 mcg on Friday, Sat, Sunday Endo is following her diabetes as well  Still has pain in the right leg; right knee; not red or hot; pain in the front; offered MRI or specialist but she declined  She is having MRI of the neck today, that is with the back doctor; they did her lower back last time; having pain along the lower left rib cage; definitely with movement  High cholesterol; did come fasting  She is having pain in the left lower side, going up to the ribcage; a few weeks at least; no dysuria; no blood in the stool; worse with movement, getting in and out of bed  Relevant past medical, surgical, family and social history reviewed and updated as indicated. Interim medical history since our last visit reviewed. Allergies and medications reviewed and updated.  Review of Systems  Per HPI unless specifically indicated above     Objective:    BP 123/58 mmHg  Pulse 67  Temp(Src) 98.1 F (36.7 C)  Ht   Wt  191 lb 9.6 oz (86.909 kg)  SpO2 99%  Wt Readings from Last 3 Encounters:  02/20/15 191 lb 9.6 oz (86.909 kg)  02/08/15 195 lb (88.451 kg)  01/22/15 194 lb (87.998 kg)   body mass index is 38.07 kg/(m^2).  Physical Exam  Constitutional: She appears well-developed and well-nourished.  Elderly, appears chronically ill; deconditioned, older than stated age; nontoxic; seated in wheelchair; gait not assessed  Cardiovascular: Normal rate and regular rhythm.   Pulmonary/Chest: Effort normal and breath sounds normal.  Abdominal: Soft. She exhibits no distension.  obese  Neurological:  Gait not assessed; seated in wheelchair  Skin:  Skin tear; few ecchymoses  Psychiatric: She has a normal mood and affect. Her affect is not blunt. She does not exhibit a depressed mood.  Good eye contact with examiner      Assessment & Plan:   Problem List Items Addressed This Visit      Respiratory   Centrilobular emphysema ([place])    Managed by Dr. 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)    Managed by endo      Adult hypothyroidism    Managed by endo        Musculoskeletal and Integument   Skin tear of right upper arm without complication    Easily torn skin with injury; recommended vitamin C; she has been offered referral to PT for falls        Other   High cholesterol - Primary (Chronic)    Check lipids today      Relevant Orders   Lipid Panel w/o Chol/HDL Ratio (Completed)   Obesity (BMI 30-39.9) (Chronic)    Patient has struggled with obesity for years; I have no doubt that her obesity plays a role in her many medical problems      Anemia    Check CBC      Relevant Orders   CBC with Differential/Platelet (Completed)   Acute right hip pain     Offered referral, MRI but she declines      Encounter for medication monitoring   Relevant Orders   Comprehensive metabolic panel (Completed)   Acute left flank pain   Relevant Orders   UA/M w/rflx Culture, Routine (Completed)      Follow up plan: Return in about 2 months (around 04/20/2015) for thirty minute follow-up.  Orders Placed This Encounter  Procedures  . Microscopic Examination  . CBC with Differential/Platelet  . Lipid Panel w/o Chol/HDL Ratio  . Comprehensive metabolic panel  . UA/M w/rflx Culture, Routine  . Urine Culture, Routine   Meds ordered this encounter  Medications  . lactulose (CHRONULAC) 10 GM/15ML solution    Sig: Take 15 mLs (10 g total) by mouth 2 (two) times daily as needed for mild constipation.    Dispense:  1892 mL    Refill:  5

## 2015-02-21 ENCOUNTER — Telehealth: Payer: Self-pay | Admitting: Family Medicine

## 2015-02-21 LAB — COMPREHENSIVE METABOLIC PANEL
ALBUMIN: 4.5 g/dL (ref 3.5–4.8)
ALK PHOS: 108 IU/L (ref 39–117)
ALT: 15 IU/L (ref 0–32)
AST: 24 IU/L (ref 0–40)
Albumin/Globulin Ratio: 1.6 (ref 1.1–2.5)
BILIRUBIN TOTAL: 0.7 mg/dL (ref 0.0–1.2)
BUN / CREAT RATIO: 12 (ref 11–26)
BUN: 14 mg/dL (ref 8–27)
CHLORIDE: 96 mmol/L (ref 96–106)
CO2: 29 mmol/L (ref 18–29)
CREATININE: 1.15 mg/dL — AB (ref 0.57–1.00)
Calcium: 9.4 mg/dL (ref 8.7–10.3)
GFR calc Af Amer: 55 mL/min/{1.73_m2} — ABNORMAL LOW (ref >59)
GFR calc non Af Amer: 48 mL/min/{1.73_m2} — ABNORMAL LOW (ref >59)
GLOBULIN, TOTAL: 2.8 g/dL (ref 1.5–4.5)
Glucose: 144 mg/dL — ABNORMAL HIGH (ref 65–99)
POTASSIUM: 4.8 mmol/L (ref 3.5–5.2)
SODIUM: 141 mmol/L (ref 134–144)
Total Protein: 7.3 g/dL (ref 6.0–8.5)

## 2015-02-21 LAB — CBC WITH DIFFERENTIAL/PLATELET
Basophils Absolute: 0 10*3/uL (ref 0.0–0.2)
Basos: 0 %
EOS (ABSOLUTE): 0.3 10*3/uL (ref 0.0–0.4)
Eos: 4 %
Hematocrit: 30.7 % — ABNORMAL LOW (ref 34.0–46.6)
Hemoglobin: 9.4 g/dL — ABNORMAL LOW (ref 11.1–15.9)
IMMATURE GRANULOCYTES: 0 %
Immature Grans (Abs): 0 10*3/uL (ref 0.0–0.1)
LYMPHS: 25 %
Lymphocytes Absolute: 2.1 10*3/uL (ref 0.7–3.1)
MCH: 24.4 pg — ABNORMAL LOW (ref 26.6–33.0)
MCHC: 30.6 g/dL — ABNORMAL LOW (ref 31.5–35.7)
MCV: 80 fL (ref 79–97)
MONOCYTES: 10 %
Monocytes Absolute: 0.8 10*3/uL (ref 0.1–0.9)
NEUTROS PCT: 61 %
Neutrophils Absolute: 5 10*3/uL (ref 1.4–7.0)
PLATELETS: 256 10*3/uL (ref 150–379)
RBC: 3.85 x10E6/uL (ref 3.77–5.28)
RDW: 17.6 % — AB (ref 12.3–15.4)
WBC: 8.2 10*3/uL (ref 3.4–10.8)

## 2015-02-21 LAB — LIPID PANEL W/O CHOL/HDL RATIO
CHOLESTEROL TOTAL: 173 mg/dL (ref 100–199)
HDL: 63 mg/dL (ref >39)
LDL Calculated: 73 mg/dL (ref 0–99)
TRIGLYCERIDES: 184 mg/dL — AB (ref 0–149)
VLDL Cholesterol Cal: 37 mg/dL (ref 5–40)

## 2015-02-21 NOTE — Telephone Encounter (Signed)
Dr. Allen Norris, Part[facility] Nearing. Please see patient's CBC. For her persistent microcytic anemia, do you feel repeat colonoscopy or capsule endoscopy worthwhile as next step, or even just stool cards, or would you suggest no further GI work-up and have me do iron studies and send to hematologist? Thank you, Rip Harbour

## 2015-02-21 NOTE — Telephone Encounter (Signed)
Patient is more anemic than before I explained Hbg down; RBCs look small and pale They recently did the EGD and did stretch procedure; did not do colonoscopy this time around, daughter thinks she just had one about a year ago No visible blood in the stool Patient has had issues with anemia for many years We discussed iron pills; patient won't take because of constipation; we'll see if GI doctor recommends further work-up (capsule endoscopy, e.g.) Discussed briefly that sometimes patients need iron IV if not absorbing We'll see if GI wants capsule endoscopy next, or if I do additional iron studies or both Reviewed other labs with daughter; LDL 29, kidneys stable; liver enzymes fine Daughter picked up antibiotic for bladder infection yesterday

## 2015-02-22 LAB — MICROSCOPIC EXAMINATION: Epithelial Cells (non renal): >10 /hpf — AB (ref 0–10)

## 2015-02-22 LAB — UA/M W/RFLX CULTURE, ROUTINE
BILIRUBIN UA: NEGATIVE
Glucose, UA: NEGATIVE
KETONES UA: NEGATIVE
NITRITE UA: NEGATIVE
Specific Gravity, UA: 1.025 (ref 1.005–1.030)
UUROB: 0.2 mg/dL (ref 0.2–1.0)
pH, UA: 5.5 (ref 5.0–7.5)

## 2015-02-22 LAB — URINE CULTURE, REFLEX

## 2015-02-22 NOTE — Telephone Encounter (Signed)
The patient's MCV has been consistently normal on the recent blood draws over the last 4 months. I will ask my nurse to order iron studies to see if this is truly a microcytic anemia. Stepehn Eckard

## 2015-02-23 ENCOUNTER — Telehealth: Payer: Self-pay

## 2015-02-23 ENCOUNTER — Encounter (HOSPITAL_COMMUNITY): Payer: Self-pay | Admitting: Emergency Medicine

## 2015-02-23 ENCOUNTER — Emergency Department (INDEPENDENT_AMBULATORY_CARE_PROVIDER_SITE_OTHER)
Admission: EM | Admit: 2015-02-23 | Discharge: 2015-02-23 | Disposition: A | Discharge status: Home / Self Care | Payer: Medicare Other | Source: Home / Self Care

## 2015-02-23 DIAGNOSIS — M4806 Spinal stenosis, lumbar region: Secondary | ICD-10-CM | POA: Diagnosis not present

## 2015-02-23 DIAGNOSIS — M5136 Other intervertebral disc degeneration, lumbar region: Secondary | ICD-10-CM | POA: Diagnosis not present

## 2015-02-23 DIAGNOSIS — D649 Anemia, unspecified: Secondary | ICD-10-CM

## 2015-02-23 DIAGNOSIS — Z794 Long term (current) use of insulin: Secondary | ICD-10-CM | POA: Diagnosis not present

## 2015-02-23 DIAGNOSIS — M5416 Radiculopathy, lumbar region: Secondary | ICD-10-CM | POA: Diagnosis not present

## 2015-02-23 DIAGNOSIS — E119 Type 2 diabetes mellitus without complications: Secondary | ICD-10-CM | POA: Diagnosis not present

## 2015-02-23 DIAGNOSIS — R002 Palpitations: Secondary | ICD-10-CM | POA: Diagnosis not present

## 2015-02-23 DIAGNOSIS — M5030 Other cervical disc degeneration, unspecified cervical region: Secondary | ICD-10-CM | POA: Diagnosis not present

## 2015-02-23 DIAGNOSIS — G4733 Obstructive sleep apnea (adult) (pediatric): Secondary | ICD-10-CM | POA: Diagnosis not present

## 2015-02-23 DIAGNOSIS — M5412 Radiculopathy, cervical region: Secondary | ICD-10-CM | POA: Insufficient documentation

## 2015-02-23 DIAGNOSIS — S41102A Unspecified open wound of left upper arm, initial encounter: Secondary | ICD-10-CM

## 2015-02-23 DIAGNOSIS — Z23 Encounter for immunization: Secondary | ICD-10-CM

## 2015-02-23 DIAGNOSIS — S41112A Laceration without foreign body of left upper arm, initial encounter: Secondary | ICD-10-CM

## 2015-02-23 DIAGNOSIS — I10 Essential (primary) hypertension: Secondary | ICD-10-CM | POA: Diagnosis not present

## 2015-02-23 DIAGNOSIS — J449 Chronic obstructive pulmonary disease, unspecified: Secondary | ICD-10-CM | POA: Diagnosis not present

## 2015-02-23 DIAGNOSIS — M48062 Spinal stenosis, lumbar region with neurogenic claudication: Secondary | ICD-10-CM | POA: Insufficient documentation

## 2015-02-23 MED ORDER — TETANUS-DIPHTH-ACELL PERTUSSIS 5-2.5-18.5 LF-MCG/0.5 IM SUSP
INTRAMUSCULAR | Status: AC
  Filled 2015-02-23: qty 0.5

## 2015-02-23 MED ORDER — TETANUS-DIPHTH-ACELL PERTUSSIS 5-2.5-18.5 LF-MCG/0.5 IM SUSP
0.5000 mL | Freq: Once | INTRAMUSCULAR | Status: AC
  Administered 2015-02-23: 0.5 mL via INTRAMUSCULAR

## 2015-02-23 NOTE — Discharge Instructions (Signed)
Keep dry , do not remove bandage, return on sun for recheck.

## 2015-02-23 NOTE — Telephone Encounter (Signed)
-----   Message from Lucilla Lame, MD sent at 02/22/2015  4:54 PM EST ----- Can you please get iron studies on this patient including iron, tibc, ferritin and iron saturation. Thanks

## 2015-02-23 NOTE — ED Notes (Signed)
Patient fell against furniture in home and has skin tears to left arm.  No loc.  Incident occurred today.

## 2015-02-23 NOTE — ED Provider Notes (Addendum)
CSN: [id number]     Arrival date & time 02/23/15  1750 History   None    Chief Complaint  Patient presents with  . Fall   (Consider location/radiation/quality/duration/timing/severity/associated sxs/prior Treatment) Patient is a 75 y.o. female presenting with arm injury. The history is provided by the patient.  Arm Injury Location:  Arm Time since incident:  2 hours Injury: yes   Mechanism of injury: fall   Mechanism of injury comment:  Fell aainst cabinet in house with skin tear to arm. Fall:    Impact surface:  Furniture   Entrapped after fall: no   Arm location:  L forearm Pain details:    Severity:  Mild   Progression:  Unchanged Chronicity:  New Dislocation: no   Tetanus status:  Out of date Associated symptoms: no decreased range of motion     Past Medical History  Diagnosis Date  . Diabetes mellitus without complication ([place])   . Atrial fibrillation ([place])   . Hypertension   . GERD (gastroesophageal reflux disease)   . CAD (coronary artery disease)   . Hypercholesteremia   . COPD (chronic obstructive pulmonary disease) ([place])   . Morbid obesity ([place])   . Post-surgical hypothyroidism   . Depression, major ([place])   . Neuropathy ([place])   . Asthma   . Sleep apnea     "2015 wore mask; lost weight; told didn't need mask anymore" (02/23/2014)  . Type II diabetes mellitus ([place])   . Anxiety   . Anemia   . History of blood transfusion     "when I had my mastectomy"  . Arthritis     "joints ache all over"   . Chronic back pain   . Breast cancer, left breast ([facility])     S/P mastectomy  . Myocardial infarction ([place])   . Shortness of breath dyspnea   . Heart murmur   . Headache   . History of hiatal hernia    Past Surgical History  Procedure Laterality Date  . Bartholin cyst marsupialization    . Trigger finger release Left   . Cataract extraction w/ intraocular lens  implant, bilateral Bilateral ~ 2011  . Total knee arthroplasty Left 1990's  . Total hip  arthroplasty Bilateral 1990's  . Thyroidectomy, partial  1950's  . Shoulder surgery Right     "cleaned it out; no scope"  . Toe surgery Right     "cut piece of bone out so toe didn't dig into my foot"  . Carotid endarterectomy Left 2009  . Hammer toe surgery Left     2nd and 3rd digits  . Tonsillectomy  1950's  . Joint replacement    . Breast biopsy Left   . Mastectomy Left 1990's  . Tubal ligation    . Vaginal hysterectomy      "partial"  . Coronary angioplasty with stent placement  1990's X 2    "2; 1"  . Cardiac catheterization  2000's  . Left heart catheterization with coronary angiogram N/A 02/24/2014    Procedure: LEFT HEART CATHETERIZATION WITH CORONARY ANGIOGRAM;  Surgeon: Leonie Man, MD;  Location: [facility] CATH LAB;  Service: Cardiovascular;  Laterality: N/A;  . Artery biopsy Right 09/29/2014    Procedure: BIOPSY TEMPORAL ARTERY;  Surgeon: Angelia Mould, MD;  Location: [facility] OR;  Service: Vascular;  Laterality: Right;  . Esophagogastroduodenoscopy (egd) with propofol N/A 12/12/2014    Procedure: ESOPHAGOGASTRODUODENOSCOPY (EGD) WITH PROPOFOL;  Surgeon: Lucilla Lame, MD;  Location: [facility] ENDOSCOPY;  Service: Endoscopy;  Laterality: N/A;   Family History  Problem Relation Age of Onset  . Arthritis Mother   . Cancer Mother   . Diabetes Mother   . Heart disease Mother   . Hyperlipidemia Mother   . Hypertension Mother   . Alcohol abuse Father   . Alcohol abuse Brother   . Arthritis Brother   . Asthma Brother   . HIV Brother   . Cancer Brother   . Diabetes Brother   . Hyperlipidemia Brother   . Hypertension Brother   . Lung disease Brother   . Arthritis Maternal Grandmother   . Brain cancer Father    Social History  Substance Use Topics  . Smoking status: Former Smoker -- 1.50 packs/day for 44 years    Types: Cigarettes    Quit date: 01/06/1994  . Smokeless tobacco: Never Used     Comment: "quit smoking cigarettes in ~ 2000"  . Alcohol Use: No     Comment:  02/23/2014 "no alcohol since 1990's"   OB History    Gravida Para Term Preterm AB TAB SAB Ectopic Multiple Living   0 0 0 0 0 0 0 0       Review of Systems  Constitutional: Negative.   Musculoskeletal: Negative.   Skin: Positive for wound.  All other systems reviewed and are negative.   Allergies  Enalapril; Vasotec; Codeine; and Morphine and related  Home Medications   Prior to Admission medications   Medication Sig Start Date End Date Taking? Authorizing Provider  acetaminophen (TYLENOL) 500 MG tablet Take 1,000 mg by mouth every 6 (six) hours as needed (pain).    Historical Provider, MD  acetylcysteine (MUCOMYST) 20 % nebulizer solution Inhale into the lungs. 11/09/14 11/09/15  Historical Provider, MD  albuterol (PROAIR HFA) 108 (90 BASE) MCG/ACT inhaler Inhale 2 puffs into the lungs every 4 (four) hours as needed for wheezing or shortness of breath.     Historical Provider, MD  albuterol (PROVENTIL) (2.5 MG/3ML) 0.083% nebulizer solution Take 2.5 mg by nebulization every 4 (four) hours as needed for wheezing or shortness of breath.    Historical Provider, MD  amLODipine (NORVASC) 5 MG tablet Take 5 mg by mouth daily.  05/14/14   Historical Provider, MD  aspirin EC 81 MG tablet Take 81 mg by mouth daily.    Historical Provider, MD  atorvastatin (LIPITOR) 40 MG tablet TAKE ONE TABLET BY MOUTH AT BEDTIME 02/06/15   Arnetha Courser, MD  bumetanide (BUMEX) 1 MG tablet Take 1 mg by mouth daily. 08/07/14   Historical Provider, MD  busPIRone (BUSPAR) 15 MG tablet Take 1 tablet (15 mg total) by mouth 2 (two) times daily. 01/22/15   Arnetha Courser, MD  carvedilol (COREG) 12.5 MG tablet Take 12.5 mg by mouth 2 (two) times daily with a meal.  01/24/14   Historical Provider, MD  doxycycline (VIBRA-TABS) 100 MG tablet Take 1 tablet (100 mg total) by mouth 2 (two) times daily. 02/20/15   Arnetha Courser, MD  fluticasone (FLONASE) 50 MCG/ACT nasal spray Place 1 spray into both nostrils 2 (two) times daily.  10/16/14   Historical Provider, MD  gabapentin (NEURONTIN) 100 MG capsule Take 1 capsule (100 mg total) by mouth 3 (three) times daily. 10/24/14   Arnetha Courser, MD  HUMALOG 100 UNIT/ML injection Inject 12 Units into the skin 3 (three) times daily with meals. PER SLIDING SCALE 06/13/14   Historical Provider, MD  HYDROcodone-acetaminophen (NORCO/VICODIN) 5-325 MG tablet Take 1 tablet  by mouth every 6 (six) hours as needed for moderate pain. 01/22/15   Arnetha Courser, MD  Hypromellose (ARTIFICIAL TEARS OP) Place 1 drop into both eyes 2 (two) times daily as needed (dry eyes).    Historical Provider, MD  insulin glargine (LANTUS) 100 UNIT/ML injection Inject 0.3 mLs (30 Units total) into the skin daily. Patient taking differently: Inject 38 Units into the skin daily.  09/28/14   Arnetha Courser, MD  lactulose (CHRONULAC) 10 GM/15ML solution Take 15 mLs (10 g total) by mouth 2 (two) times daily as needed for mild constipation. 02/20/15   Arnetha Courser, MD  levothyroxine (SYNTHROID, LEVOTHROID) 100 MCG tablet Take 1 tablet (100 mcg total) by mouth every other day. (alternate with the 112 mcg strength) 09/08/14   Arnetha Courser, MD  levothyroxine (SYNTHROID, LEVOTHROID) 112 MCG tablet Take 1 tablet (112 mcg total) by mouth every other day. (alternate with the 100 mcg strength) 09/08/14   Arnetha Courser, MD  montelukast (SINGULAIR) 10 MG tablet Take 1 tablet (10 mg total) by mouth daily. 12/07/14   Arnetha Courser, MD  nitroGLYCERIN (NITROSTAT) 0.4 MG SL tablet Place 0.4 mg under the tongue every 5 (five) minutes as needed for chest pain.    Historical Provider, MD  omeprazole (PRILOSEC) 20 MG capsule TAKE ONE CAPSULE BY MOUTH TWICE DAILY AS NEEDED. (USE MAY INCREASE RISK OF PNEUMONIA, OSTEOPOROSIS, AND ANEMIA). 02/21/15   Arnetha Courser, MD  promethazine (PHENERGAN) 12.5 MG tablet Take 1 tablet (12.5 mg total) by mouth every 6 (six) hours as needed for nausea or vomiting. 09/28/14   Arnetha Courser, MD  QUEtiapine  (SEROQUEL) 25 MG tablet Take 0.5 tablets (12.5 mg total) by mouth at bedtime. 01/22/15   Arnetha Courser, MD  rOPINIRole (REQUIP) 1 MG tablet Take 1 tablet (1 mg total) by mouth 3 (three) times daily. 12/07/14   Arnetha Courser, MD  senna (SENOKOT) 8.6 MG TABS tablet Take 2 tablets by mouth at bedtime.     Historical Provider, MD  sertraline (ZOLOFT) 100 MG tablet Take 1.5 tablets (150 mg total) by mouth daily. 01/22/15   Arnetha Courser, MD  spironolactone (ALDACTONE) 25 MG tablet Take 25 mg by mouth daily. 08/27/14   Historical Provider, MD  SYMBICORT 160-4.5 MCG/ACT inhaler INHALE TWO PUFFS BY MOUTH TWICE DAILY . RINSE MOUTH AFTER EACH USE 09/20/14   Arnetha Courser, MD  tiotropium (SPIRIVA HANDIHALER) 18 MCG inhalation capsule Place 18 mcg into inhaler and inhale daily.    Historical Provider, MD   Meds Ordered and Administered this Visit   Medications  Tdap (BOOSTRIX) injection 0.5 mL (0.5 mLs Intramuscular Given 02/23/15 1917)    BP 183/61 mmHg  Pulse 72  Temp(Src) 99.1 F (37.3 C) (Oral)  Resp 16  SpO2 94% No data found.   Physical Exam  Constitutional: She is oriented to person, place, and time. She appears well-developed and well-nourished. No distress.  Musculoskeletal: Normal range of motion. She exhibits no tenderness.  Neurological: She is alert and oriented to person, place, and time.  Skin: Skin is warm and dry.  4x6cm superficial epithelial skin tear to left lat elbow, no sub-q exposure, distal nvt intact.bleeding controlled.  Nursing note and vitals reviewed.   ED Course  Debridement Date/Time: 02/23/2015 7:00 PM Performed by: Billy Fischer Authorized by: Ihor Gully D Consent: Verbal consent obtained. Consent given by: patient Preparation: Patient was prepped and draped in the usual sterile fashion. Local  anesthesia used: no Patient sedated: no Patient tolerance: Patient tolerated the procedure well with no immediate complications Comments: Sure clense washed, torn  skin debrided, xeroform , dsd applied.   (including critical care time)  Labs Review Labs Reviewed - No data to display  Imaging Review No results found.   Visual Acuity Review  Right Eye Distance:   Left Eye Distance:   Bilateral Distance:    Right Eye Near:   Left Eye Near:    Bilateral Near:         MDM   1. Skin tear of left upper arm without complication, initial encounter    Meds ordered this encounter  Medications  . Tdap (BOOSTRIX) injection 0.5 mL    Sig:       Billy Fischer, MD 02/23/15 [place], MD 02/23/15 2026

## 2015-02-23 NOTE — Telephone Encounter (Signed)
LVM for pt to return my call to get iron studies done.

## 2015-02-25 DIAGNOSIS — S41111A Laceration without foreign body of right upper arm, initial encounter: Secondary | ICD-10-CM | POA: Insufficient documentation

## 2015-02-25 NOTE — Assessment & Plan Note (Signed)
Cleaned and dressed

## 2015-02-26 ENCOUNTER — Ambulatory Visit: Payer: Medicare Other | Admitting: Physical Therapy

## 2015-02-27 ENCOUNTER — Other Ambulatory Visit: Payer: Self-pay | Admitting: Family Medicine

## 2015-02-27 NOTE — Telephone Encounter (Signed)
Please let patient / daughter know that I'll be glad to increase the night-time dose of gabapentin a little to help with her discomfort

## 2015-02-27 NOTE — Telephone Encounter (Signed)
Patient's daughter notified.

## 2015-02-28 ENCOUNTER — Other Ambulatory Visit: Payer: Self-pay

## 2015-03-01 ENCOUNTER — Ambulatory Visit
Admission: RE | Admit: 2015-03-01 | Discharge: 2015-03-01 | Disposition: A | Discharge status: Home / Self Care | Payer: Medicare Other | Source: Ambulatory Visit | Attending: Physical Medicine and Rehabilitation | Admitting: Physical Medicine and Rehabilitation

## 2015-03-01 DIAGNOSIS — M47817 Spondylosis without myelopathy or radiculopathy, lumbosacral region: Secondary | ICD-10-CM | POA: Diagnosis not present

## 2015-03-01 DIAGNOSIS — G8929 Other chronic pain: Secondary | ICD-10-CM

## 2015-03-01 DIAGNOSIS — M545 Low back pain: Principal | ICD-10-CM

## 2015-03-01 MED ORDER — IOHEXOL 180 MG/ML  SOLN
1.0000 mL | Freq: Once | INTRAMUSCULAR | Status: AC | PRN
  Administered 2015-03-01: 1 mL via EPIDURAL

## 2015-03-01 MED ORDER — METHYLPREDNISOLONE ACETATE 40 MG/ML INJ SUSP (RADIOLOG
120.0000 mg | Freq: Once | INTRAMUSCULAR | Status: AC
  Administered 2015-03-01: 120 mg via EPIDURAL

## 2015-03-01 NOTE — Discharge Instructions (Signed)

## 2015-03-04 ENCOUNTER — Encounter: Payer: Self-pay | Admitting: Family Medicine

## 2015-03-04 DIAGNOSIS — K5900 Constipation, unspecified: Secondary | ICD-10-CM | POA: Insufficient documentation

## 2015-03-04 HISTORY — DX: Constipation, unspecified: K59.00

## 2015-03-04 NOTE — Assessment & Plan Note (Signed)
Managed by endo

## 2015-03-04 NOTE — Assessment & Plan Note (Signed)
Easily torn skin with injury; recommended vitamin C; she has been offered referral to PT for falls

## 2015-03-04 NOTE — Assessment & Plan Note (Signed)
Offered referral, MRI but she declines

## 2015-03-04 NOTE — Assessment & Plan Note (Signed)
Patient has struggled with obesity for years; I have no doubt that her obesity plays a role in her many medical problems

## 2015-03-04 NOTE — Assessment & Plan Note (Signed)
Does not seem related to thyroid, as last TSH was normal range; likely combination of inactivity, not enough dietary fiber, and pain medicine; will add lactulose

## 2015-03-05 ENCOUNTER — Other Ambulatory Visit: Payer: Self-pay | Admitting: Family Medicine

## 2015-03-07 ENCOUNTER — Other Ambulatory Visit: Payer: Self-pay | Admitting: Vascular Surgery

## 2015-03-07 DIAGNOSIS — I6529 Occlusion and stenosis of unspecified carotid artery: Secondary | ICD-10-CM | POA: Diagnosis not present

## 2015-03-07 DIAGNOSIS — I10 Essential (primary) hypertension: Secondary | ICD-10-CM | POA: Diagnosis not present

## 2015-03-07 DIAGNOSIS — E119 Type 2 diabetes mellitus without complications: Secondary | ICD-10-CM | POA: Diagnosis not present

## 2015-03-07 DIAGNOSIS — I6523 Occlusion and stenosis of bilateral carotid arteries: Secondary | ICD-10-CM

## 2015-03-07 DIAGNOSIS — R609 Edema, unspecified: Secondary | ICD-10-CM | POA: Diagnosis not present

## 2015-03-07 DIAGNOSIS — M1990 Unspecified osteoarthritis, unspecified site: Secondary | ICD-10-CM | POA: Diagnosis not present

## 2015-03-07 DIAGNOSIS — E669 Obesity, unspecified: Secondary | ICD-10-CM | POA: Diagnosis not present

## 2015-03-07 DIAGNOSIS — J45909 Unspecified asthma, uncomplicated: Secondary | ICD-10-CM | POA: Diagnosis not present

## 2015-03-07 DIAGNOSIS — E785 Hyperlipidemia, unspecified: Secondary | ICD-10-CM | POA: Diagnosis not present

## 2015-03-09 ENCOUNTER — Encounter: Payer: Self-pay | Admitting: Physical Therapy

## 2015-03-09 ENCOUNTER — Ambulatory Visit: Payer: Medicare Other | Attending: Family Medicine | Admitting: Physical Therapy

## 2015-03-09 DIAGNOSIS — M542 Cervicalgia: Secondary | ICD-10-CM

## 2015-03-09 DIAGNOSIS — R5381 Other malaise: Secondary | ICD-10-CM | POA: Diagnosis not present

## 2015-03-09 DIAGNOSIS — R269 Unspecified abnormalities of gait and mobility: Secondary | ICD-10-CM

## 2015-03-09 DIAGNOSIS — R296 Repeated falls: Secondary | ICD-10-CM

## 2015-03-09 NOTE — Therapy (Signed)
[address] [place], [place], [address] Phone: [phone]   Fax:  [phone]  Physical Therapy Evaluation  Patient Details  Name: Bonnie Henry MRN: [id number] Date of Birth: Sep 11, 1940 Referring Provider: Sanda Klein  Encounter Date: 03/09/2015      PT End of Session - 03/09/15 1137    Visit Number 1   Date for PT Re-Evaluation 05/09/15   Authorization Type Medicare   PT Start Time 1054   PT Stop Time 1150   PT Time Calculation (min) 56 min   Equipment Utilized During Treatment Oxygen   Activity Tolerance Patient limited by pain;Patient limited by fatigue   Behavior During Therapy [facility] for tasks assessed/performed      Past Medical History  Diagnosis Date  . Diabetes mellitus without complication ([place])   . Atrial fibrillation ([place])   . Hypertension   . GERD (gastroesophageal reflux disease)   . CAD (coronary artery disease)   . Hypercholesteremia   . COPD (chronic obstructive pulmonary disease) ([place])   . Morbid obesity ([place])   . Post-surgical hypothyroidism   . Depression, major ([place])   . Neuropathy ([place])   . Asthma   . Sleep apnea     "2015 wore mask; lost weight; told didn't need mask anymore" (02/23/2014)  . Type II diabetes mellitus ([place])   . Anxiety   . Anemia   . History of blood transfusion     "when I had my mastectomy"  . Arthritis     "joints ache all over"   . Chronic back pain   . Breast cancer, left breast ([facility])     S/P mastectomy  . Myocardial infarction ([place])   . Shortness of breath dyspnea   . Heart murmur   . Headache   . History of hiatal hernia   . Constipation 03/04/2015    Past Surgical History  Procedure Laterality Date  . Bartholin cyst marsupialization    . Trigger finger release Left   . Cataract extraction w/ intraocular lens  implant, bilateral Bilateral ~ 2011  . Total knee arthroplasty Left 1990's  . Total hip arthroplasty Bilateral 1990's  .  Thyroidectomy, partial  1950's  . Shoulder surgery Right     "cleaned it out; no scope"  . Toe surgery Right     "cut piece of bone out so toe didn't dig into my foot"  . Carotid endarterectomy Left 2009  . Hammer toe surgery Left     2nd and 3rd digits  . Tonsillectomy  1950's  . Joint replacement    . Breast biopsy Left   . Mastectomy Left 1990's  . Tubal ligation    . Vaginal hysterectomy      "partial"  . Coronary angioplasty with stent placement  1990's X 2    "2; 1"  . Cardiac catheterization  2000's  . Left heart catheterization with coronary angiogram N/A 02/24/2014    Procedure: LEFT HEART CATHETERIZATION WITH CORONARY ANGIOGRAM;  Surgeon: Leonie Man, MD;  Location: [facility] CATH LAB;  Service: Cardiovascular;  Laterality: N/A;  . Artery biopsy Right 09/29/2014    Procedure: BIOPSY TEMPORAL ARTERY;  Surgeon: Angelia Mould, MD;  Location: [facility] OR;  Service: Vascular;  Laterality: Right;  . Esophagogastroduodenoscopy (egd) with propofol N/A 12/12/2014    Procedure: ESOPHAGOGASTRODUODENOSCOPY (EGD) WITH PROPOFOL;  Surgeon: Lucilla Lame, MD;  Location: [facility] ENDOSCOPY;  Service: Endoscopy;  Laterality: N/A;    There were no vitals filed  for this visit.  Visit Diagnosis:  Debility - Plan: PT plan of care cert/re-cert  Falls frequently - Plan: PT plan of care cert/re-cert  Gait disturbance - Plan: PT plan of care cert/re-cert  Neck pain - Plan: PT plan of care cert/re-cert      Subjective Assessment - 03/09/15 1100    Subjective Patient reports that she has had 6 falls in the last 5 weeks.  She reports that she has had issues fro awhile but more falls recently.  Describes a fall 2 days ago where she hit her head.   Patient Stated Goals fall less   Currently in Pain? Yes   Pain Score 6    Pain Location Head   Pain Descriptors / Indicators Aching   Pain Type Acute pain   Pain Onset In the past 7 days   Pain Frequency Constant   Aggravating Factors  unsure   Pain  Relieving Factors unsure   Effect of Pain on Daily Activities hard to concentrate            [facility] PT Assessment - 03/09/15 0001    Assessment   Medical Diagnosis falls, debility   Referring Provider Lada   Onset Date/Surgical Date 02/02/15   Prior Therapy no   Precautions   Precautions Fall   Precaution Comments on 2L O2   Balance Screen   Has the patient fallen in the past 6 months Yes   How many times? 20+   Has the patient had a decrease in activity level because of a fear of falling?  Yes   Is the patient reluctant to leave their home because of a fear of falling?  Yes   Home Environment   Additional Comments 6 steps into the home, some cooking   Prior Function   Level of Independence Independent with household mobility with device   Vocation On disability   Leisure no exercise   Posture/Postural Control   Posture Comments fwd head, rounded shoulders, active accessory mms to help with breathing   AROM   Overall AROM Comments Cervical ROM WNL's for flexion, decreased 100% for extension, rotation decreased 50%, side bending decreased 75%, LE's WFL's   Strength   Overall Strength Comments hips, knees and ankles 4-/5, UE's 3+/5   Palpation   Palpation comment she is very tender in the cervical area and the upper traps, resistant to touch   Ambulation/Gait   Gait Comments uses a 4 wheeled rollator walkere, slow steps, some shuffling steps, feet turn in some   Standardized Balance Assessment   Standardized Balance Assessment Timed Up and Go Test   Berg Balance Test   Sit to Stand Able to stand  independently using hands   Standing Unsupported Able to stand 30 seconds unsupported   Sitting with Back Unsupported but Feet Supported on Floor or Stool Able to sit safely and securely 2 minutes   Stand to Sit Sits safely with minimal use of hands   Transfers Able to transfer safely, definite need of hands   Standing Unsupported with Eyes Closed Able to stand 10 seconds with  supervision   Standing Ubsupported with Feet Together Able to place feet together independently and stand for 1 minute with supervision   From Standing, Reach Forward with Outstretched Arm Can reach forward >12 cm safely (5")   From Standing Position, Pick up Object from Floor Unable to pick up shoe, but reaches 2-5 cm (1-2") from shoe and balances independently   From Standing Position,  Turn to Look Behind Over each Shoulder Needs supervision when turning   Turn 360 Degrees Needs close supervision or verbal cueing   Standing Unsupported, Alternately Place Feet on Step/Stool Able to complete 4 steps without aid or supervision   Standing Unsupported, One Foot in [place] help to step but can hold 15 seconds   Standing on One Leg Tries to lift leg/unable to hold 3 seconds but remains standing independently   Total Score 33   Timed Up and Go Test   Normal TUG (seconds) 29                             PT Short Term Goals - 03/10/15 1142    PT SHORT TERM GOAL #1   Title independent with initial HEP   Time 2   Period Weeks   Status New           PT Long Term Goals - 2015-03-10 1142    PT LONG TERM GOAL #1   Title decrease HA/neck pain by 50%   Time 8   Period Weeks   Status New   PT LONG TERM GOAL #2   Title decrease TUG time to 19 seconds   Time 8   Period Weeks   Status New   PT LONG TERM GOAL #3   Title increase Berg balance score to 46/56   Time 8   Period Weeks   Status New   PT LONG TERM GOAL #4   Title increase LE strength to 4/5   Time 8   Period Weeks   Status New   PT LONG TERM GOAL #5   Title have less than 2 falls over a 5 week period   Time 8   Period Weeks   Status New               Plan - 2015/03/10 1138    Clinical Impression Statement Patient with a long medical history, presents today due to 20+ falls in the past 6 months, 6 falls in the past 5 weeks.  She is on 2 L O2, she uses a rollator walker, tends to shuffle her  feet, she does have HA and neck pain after a fall last week, she is very tight and tender in the cervical spine and in the upper traps.  Her TUG was 28 seconds and her Berg balance test score was 33/56.  She does some cooking at home, when she shops she uses an Web designer.   Pt will benefit from skilled therapeutic intervention in order to improve on the following deficits Abnormal gait;Cardiopulmonary status limiting activity;Decreased activity tolerance;Decreased balance;Decreased mobility;Decreased range of motion;Decreased strength;Difficulty walking;Increased muscle spasms   Rehab Potential Good   PT Frequency 2x / week   PT Treatment/Interventions Moist Heat;Electrical Stimulation;Functional mobility training;Therapeutic activities;Therapeutic exercise;Patient/family education;Balance training;Gait training;Stair training   PT Next Visit Plan She lives in [place], I gave her and her daughter options for care here, [place] and at our Neuro site, the neuro site is closer to them and they are to call and schedule appoint with them.   Consulted and Agree with Plan of Care Patient          G-Codes - 2015-03-10 1152    Functional Assessment Tool Used Foto 70% limitation   Functional Limitation Mobility: Walking and moving around   Mobility: Walking and Moving Around Current Status [id number]) At least 60 percent but less than  80 percent impaired, limited or restricted   Mobility: Walking and Moving Around Goal Status [phone]) At least 40 percent but less than 60 percent impaired, limited or restricted       Problem List Patient Active Problem List   Diagnosis Date Noted  . Constipation 03/04/2015  . Skin tear of right upper arm without complication [id number]  . Encounter for medication monitoring 02/20/2015  . Acute left flank pain 02/20/2015  . Fall 02/08/2015  . Knee pain, bilateral 02/08/2015  . Acute right hip pain 02/08/2015  . Frequent falls 01/22/2015  . Gastritis   .  Encounter for screening mammogram for breast cancer 11/27/2014  . Left hip pain 11/22/2014  . 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 Chronic interstitial lung disease ([place]) 10/26/2014  . Centrilobular emphysema ([place]) 10/26/2014  . Oxygen desaturation 10/26/2014  . Dysphagia 10/18/2014  . Unilateral headache 10/02/2014  . Vision blurred 09/28/2014  . Acquired exophthalmos 09/28/2014  . Diabetes mellitus with diabetic neuropathy ([place]) 08/31/2014  . Hammertoe 08/31/2014  . Foot callus 08/31/2014  . 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 Anemia 05/21/2014  . Chest pain 05/20/2014  . Chronic pain 03/02/2014  . Clinical depression 03/02/2014  . Acid reflux 03/02/2014  . Glaucoma 03/02/2014  . Adult hypothyroidism 03/02/2014  . Adiposity 03/02/2014  . Inflamed nasal mucosa 03/02/2014  . Apnea, sleep 03/02/2014  . PAT (paroxysmal atrial tachycardia) ([place]) 02/24/2014  . Unstable angina ([place]) 02/23/2014  . Type 2 diabetes with nephropathy ([place]) 02/23/2014  . HTN (hypertension) 02/23/2014  . CAD S/P remote PCI  02/23/2014  . High cholesterol 02/23/2014  . PVD- h/o LCEA 02/23/2014  . Obesity (BMI 30-39.9) 02/23/2014  . Aortic stenosis murmur 02/23/2014  . COPD, moderate ([place]) 07/27/2013    Sumner Boast., PT 03/09/2015, 12:00 PM  [address] [place], [place], [address] Phone: [phone]   Fax:  [phone]  Name: Bonnie Henry MRN: [id number] Date of Birth: [birth year]

## 2015-03-09 NOTE — Patient Instructions (Signed)
Single Leg - Eyes Open  Holding support, lift right leg while maintaining balance over other leg. Progress to removing hands from support surface for longer periods of time. Hold____ seconds. Repeat ____ times per session. Do ____ sessions per day. Single Leg (Compliant Surface) - Eyes Open  Stand on compliant surface: ________ holding support. Lift right leg while maintaining balance over other leg. Progress to removing hands from support surface for longer periods of time. Hold____ seconds. Repeat ____ times per session. Do ____ sessions per day.  Feet Heel-Toe "Tandem", Arm Motion  Eyes Open  With eyes open, right foot directly in front of the other, move arms up and down: to front. Repeat ____ times per session. Do ____ sessions per day. Feet Heel-Toe "Tandem" (Compliant Surface) Arm Motion - Eyes Open  With eyes open, standing on compliant surface: ________, right foot directly in front of the other, move arms up and down: to front. Repeat ____ times per session. Do ____ per day.  Feet Heel-Toe "Tandem"   Arms outstretched, walk a straight line bringing one foot directly in front of the other. Repeat for ____ minutes per session. _ per day. Side-Stepping   Walk to left side with eyes open. Take even steps, leading with same foot. Repeat in other direction. Repeat for _ minutes per session.Do __ per day.

## 2015-03-14 ENCOUNTER — Ambulatory Visit: Payer: Medicare Other | Admitting: Physical Therapy

## 2015-03-14 DIAGNOSIS — R5381 Other malaise: Secondary | ICD-10-CM | POA: Diagnosis not present

## 2015-03-14 DIAGNOSIS — R296 Repeated falls: Secondary | ICD-10-CM

## 2015-03-14 DIAGNOSIS — M542 Cervicalgia: Secondary | ICD-10-CM | POA: Diagnosis not present

## 2015-03-14 DIAGNOSIS — R269 Unspecified abnormalities of gait and mobility: Secondary | ICD-10-CM

## 2015-03-14 NOTE — Therapy (Signed)
[facility] [address] [place], [place], [address] Phone: [phone]   Fax:  [phone]  Physical Therapy Treatment  Patient Details  Name: Bonnie Henry MRN: [id number] Date of Birth: 06/01/1940 Referring Provider: Sanda Klein  Encounter Date: 03/14/2015      PT End of Session - 03/14/15 1210    Visit Number 2   Date for PT Re-Evaluation 05/09/15   Authorization Type Medicare   PT Start Time 1100   PT Stop Time 1138   PT Time Calculation (min) 38 min   Equipment Utilized During Treatment Oxygen;Gait belt   Activity Tolerance Patient limited by pain;Patient limited by fatigue   Behavior During Therapy [facility] for tasks assessed/performed      Past Medical History  Diagnosis Date  . Diabetes mellitus without complication ([place])   . Atrial fibrillation ([place])   . Hypertension   . GERD (gastroesophageal reflux disease)   . CAD (coronary artery disease)   . Hypercholesteremia   . COPD (chronic obstructive pulmonary disease) ([place])   . Morbid obesity ([place])   . Post-surgical hypothyroidism   . Depression, major ([place])   . Neuropathy ([place])   . Asthma   . Sleep apnea     "2015 wore mask; lost weight; told didn't need mask anymore" (02/23/2014)  . Type II diabetes mellitus ([place])   . Anxiety   . Anemia   . History of blood transfusion     "when I had my mastectomy"  . Arthritis     "joints ache all over"   . Chronic back pain   . Breast cancer, left breast (HCC)     S/P mastectomy  . Myocardial infarction ([place])   . Shortness of breath dyspnea   . Heart murmur   . Headache   . History of hiatal hernia   . Constipation 03/04/2015    Past Surgical History  Procedure Laterality Date  . Bartholin cyst marsupialization    . Trigger finger release Left   . Cataract extraction w/ intraocular lens  implant, bilateral Bilateral ~ 2011  . Total knee arthroplasty Left 1990's  . Total hip arthroplasty Bilateral 1990's   . Thyroidectomy, partial  1950's  . Shoulder surgery Right     "cleaned it out; no scope"  . Toe surgery Right     "cut piece of bone out so toe didn't dig into my foot"  . Carotid endarterectomy Left 2009  . Hammer toe surgery Left     2nd and 3rd digits  . Tonsillectomy  1950's  . Joint replacement    . Breast biopsy Left   . Mastectomy Left 1990's  . Tubal ligation    . Vaginal hysterectomy      "partial"  . Coronary angioplasty with stent placement  1990's X 2    "2; 1"  . Cardiac catheterization  2000's  . Left heart catheterization with coronary angiogram N/A 02/24/2014    Procedure: LEFT HEART CATHETERIZATION WITH CORONARY ANGIOGRAM;  Surgeon: Leonie Man, MD;  Location: [facility] CATH LAB;  Service: Cardiovascular;  Laterality: N/A;  . Artery biopsy Right 09/29/2014    Procedure: BIOPSY TEMPORAL ARTERY;  Surgeon: Angelia Mould, MD;  Location: [facility] OR;  Service: Vascular;  Laterality: Right;  . Esophagogastroduodenoscopy (egd) with propofol N/A 12/12/2014    Procedure: ESOPHAGOGASTRODUODENOSCOPY (EGD) WITH PROPOFOL;  Surgeon: Lucilla Lame, MD;  Location: [facility] ENDOSCOPY;  Service: Endoscopy;  Laterality: N/A;    There were no vitals filed for this  visit.  Visit Diagnosis:  Debility  Falls frequently  Gait disturbance  Neck pain      Subjective Assessment - 03/14/15 1103    Subjective no falls since last visit; [facility] here today from [facility].  hasn't done exercises; has misplaced papers.  back is bothering pt today.  having headaches since last fall ("I need to follow up with my doctor.")   Patient Stated Goals fall less   Currently in Pain? Yes   Pain Score 5    Pain Location Back   Pain Orientation Lower   Pain Descriptors / Indicators Sore   Aggravating Factors  walking; forward flexed posture   Pain Relieving Factors rest; sitting                              Balance Exercises - 03/14/15 1107    OTAGO PROGRAM   Head Movements  Sitting;5 reps   Neck Movements Sitting;5 reps   Back Extension Standing;5 reps   Trunk Movements Standing;5 reps   Ankle Movements Sitting;10 reps   Knee Extensor 10 reps   Knee Flexor 10 reps   Hip ABductor 10 reps   Ankle Plantorflexors 20 reps, support   Ankle Dorsiflexors 20 reps, support   Knee Bends 10 reps, support   Backwards Walking Support   Sideways Walking Assistive device   Tandem Stance 10 seconds, support   Tandem Walk Support             PT Short Term Goals - 03/09/15 1142    PT SHORT TERM GOAL #1   Title independent with initial HEP   Time 2   Period Weeks   Status New           PT Long Term Goals - 03/09/15 1142    PT LONG TERM GOAL #1   Title decrease HA/neck pain by 50%   Time 8   Period Weeks   Status New   PT LONG TERM GOAL #2   Title decrease TUG time to 19 seconds   Time 8   Period Weeks   Status New   PT LONG TERM GOAL #3   Title increase Berg balance score to 46/56   Time 8   Period Weeks   Status New   PT LONG TERM GOAL #4   Title increase LE strength to 4/5   Time 8   Period Weeks   Status New   PT LONG TERM GOAL #5   Title have less than 2 falls over a 5 week period   Time 8   Period Weeks   Status New               Plan - 03/14/15 1210    Clinical Impression Statement Initiated OTAGO HEP for pt to perform at home.  Pt needed multiple seated rest breaks due to fatigue as she doesn't exercise.  O2 sats > 90% during session.   PT Next Visit Plan Complete OTAGO; continue balance, gait and mobility   Consulted and Agree with Plan of Care Patient        Problem List Patient Active Problem List   Diagnosis Date Noted  . Constipation 03/04/2015  . Skin tear of right upper arm without complication [id number]  . Encounter for medication monitoring 02/20/2015  . Acute left flank pain 02/20/2015  . Fall 02/08/2015  . Knee pain, bilateral 02/08/2015  . Acute right hip pain 02/08/2015  .  Frequent falls  01/22/2015  . Gastritis   . Encounter for screening mammogram for breast cancer 11/27/2014  . Left hip pain 11/22/2014  . 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 Chronic interstitial lung disease ([place]) 10/26/2014  . Centrilobular emphysema ([place]) 10/26/2014  . Oxygen desaturation 10/26/2014  . Dysphagia 10/18/2014  . Unilateral headache 10/02/2014  . Vision blurred 09/28/2014  . Acquired exophthalmos 09/28/2014  . Diabetes mellitus with diabetic neuropathy ([place]) 08/31/2014  . Hammertoe 08/31/2014  . Foot callus 08/31/2014  . 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 Anemia 05/21/2014  . Chest pain 05/20/2014  . Chronic pain 03/02/2014  . Clinical depression 03/02/2014  . Acid reflux 03/02/2014  . Glaucoma 03/02/2014  . Adult hypothyroidism 03/02/2014  . Adiposity 03/02/2014  . Inflamed nasal mucosa 03/02/2014  . Apnea, sleep 03/02/2014  . PAT (paroxysmal atrial tachycardia) ([place]) 02/24/2014  . Unstable angina ([place]) 02/23/2014  . Type 2 diabetes with nephropathy ([place]) 02/23/2014  . HTN (hypertension) 02/23/2014  . CAD S/P remote PCI  02/23/2014  . High cholesterol 02/23/2014  . PVD- h/o LCEA 02/23/2014  . Obesity (BMI 30-39.9) 02/23/2014  . Aortic stenosis murmur 02/23/2014  . COPD, moderate ([place]) 07/27/2013   Laureen Abrahams, PT, DPT 03/14/2015 12:13 PM  [facility] [address] [place], [place], [address] Phone: [phone]   Fax:  [phone]  Name: Bonnee Estela MRN: [id number] Date of Birth: Mar 09, 1940

## 2015-03-16 ENCOUNTER — Ambulatory Visit
Admission: RE | Admit: 2015-03-16 | Discharge: 2015-03-16 | Disposition: A | Discharge status: Home / Self Care | Payer: Medicare Other | Source: Ambulatory Visit | Attending: Vascular Surgery | Admitting: Vascular Surgery

## 2015-03-16 DIAGNOSIS — I6502 Occlusion and stenosis of left vertebral artery: Secondary | ICD-10-CM | POA: Diagnosis not present

## 2015-03-16 DIAGNOSIS — I6521 Occlusion and stenosis of right carotid artery: Secondary | ICD-10-CM | POA: Diagnosis not present

## 2015-03-16 DIAGNOSIS — I6503 Occlusion and stenosis of bilateral vertebral arteries: Secondary | ICD-10-CM | POA: Diagnosis not present

## 2015-03-16 DIAGNOSIS — I6523 Occlusion and stenosis of bilateral carotid arteries: Secondary | ICD-10-CM | POA: Diagnosis not present

## 2015-03-16 MED ORDER — IOHEXOL 350 MG/ML SOLN
75.0000 mL | Freq: Once | INTRAVENOUS | Status: AC | PRN
  Administered 2015-03-16: 75 mL via INTRAVENOUS

## 2015-03-20 ENCOUNTER — Ambulatory Visit: Payer: Medicare Other | Admitting: Physical Therapy

## 2015-03-20 DIAGNOSIS — M542 Cervicalgia: Secondary | ICD-10-CM | POA: Diagnosis not present

## 2015-03-20 DIAGNOSIS — R269 Unspecified abnormalities of gait and mobility: Secondary | ICD-10-CM | POA: Diagnosis not present

## 2015-03-20 DIAGNOSIS — R296 Repeated falls: Secondary | ICD-10-CM | POA: Diagnosis not present

## 2015-03-20 DIAGNOSIS — R5381 Other malaise: Secondary | ICD-10-CM

## 2015-03-20 NOTE — Therapy (Signed)
[facility] [address] [place], [place], [address] Phone: [phone]   Fax:  [phone]  Physical Therapy Treatment  Patient Details  Name: Bonnie Henry MRN: [id number] Date of Birth: [birth year] Referring Provider: Sanda Klein  Encounter Date: 03/20/2015      PT End of Session - 03/20/15 1051    Visit Number 3   Date for PT Re-Evaluation 05/09/15   Authorization Type Medicare   PT Start Time 0805   PT Stop Time [id number]   PT Time Calculation (min) 42 min   Equipment Utilized During Treatment Oxygen;Gait belt   Activity Tolerance Patient limited by pain;Patient limited by fatigue   Behavior During Therapy [facility] for tasks assessed/performed      Past Medical History  Diagnosis Date  . Diabetes mellitus without complication ([place])   . Atrial fibrillation ([place])   . Hypertension   . GERD (gastroesophageal reflux disease)   . CAD (coronary artery disease)   . Hypercholesteremia   . COPD (chronic obstructive pulmonary disease) ([place])   . Morbid obesity ([place])   . Post-surgical hypothyroidism   . Depression, major ([place])   . Neuropathy ([place])   . Asthma   . Sleep apnea     "2015 wore mask; lost weight; told didn't need mask anymore" (02/23/2014)  . Type II diabetes mellitus ([place])   . Anxiety   . Anemia   . History of blood transfusion     "when I had my mastectomy"  . Arthritis     "joints ache all over"   . Chronic back pain   . Breast cancer, left breast (HCC)     S/P mastectomy  . Myocardial infarction ([place])   . Shortness of breath dyspnea   . Heart murmur   . Headache   . History of hiatal hernia   . Constipation 03/04/2015    Past Surgical History  Procedure Laterality Date  . Bartholin cyst marsupialization    . Trigger finger release Left   . Cataract extraction w/ intraocular lens  implant, bilateral Bilateral ~ 2011  . Total knee arthroplasty Left 1990's  . Total hip arthroplasty Bilateral  1990's  . Thyroidectomy, partial  1950's  . Shoulder surgery Right     "cleaned it out; no scope"  . Toe surgery Right     "cut piece of bone out so toe didn't dig into my foot"  . Carotid endarterectomy Left 2009  . Hammer toe surgery Left     2nd and 3rd digits  . Tonsillectomy  1950's  . Joint replacement    . Breast biopsy Left   . Mastectomy Left 1990's  . Tubal ligation    . Vaginal hysterectomy      "partial"  . Coronary angioplasty with stent placement  1990's X 2    "2; 1"  . Cardiac catheterization  2000's  . Left heart catheterization with coronary angiogram N/A 02/24/2014    Procedure: LEFT HEART CATHETERIZATION WITH CORONARY ANGIOGRAM;  Surgeon: Leonie Man, MD;  Location: [facility] CATH LAB;  Service: Cardiovascular;  Laterality: N/A;  . Artery biopsy Right 09/29/2014    Procedure: BIOPSY TEMPORAL ARTERY;  Surgeon: Angelia Mould, MD;  Location: [facility] OR;  Service: Vascular;  Laterality: Right;  . Esophagogastroduodenoscopy (egd) with propofol N/A 12/12/2014    Procedure: ESOPHAGOGASTRODUODENOSCOPY (EGD) WITH PROPOFOL;  Surgeon: Lucilla Lame, MD;  Location: [facility] ENDOSCOPY;  Service: Endoscopy;  Laterality: N/A;    There were no vitals filed for this  visit.  Visit Diagnosis:  Debility      Subjective Assessment - 03/20/15 0808    Currently in Pain? Yes   Pain Score 8   when walking;"not a lot" at rest   Pain Location Knee   Pain Orientation Right   Pain Descriptors / Indicators Sharp;Stabbing;Aching   Pain Type Chronic pain   Pain Onset More than a month ago   Pain Frequency Occasional   Aggravating Factors  walking, exercise   Pain Relieving Factors rest   Multiple Pain Sites Yes   Pain Score 10  comes and goes   Pain Location Back   Pain Descriptors / Indicators Aching   Pain Type Chronic pain   Pain Onset More than a month ago   Pain Frequency Occasional   Aggravating Factors  up a lot   Pain Relieving Factors depends on the day                               Balance Exercises - 03/20/15 0820    OTAGO PROGRAM   Head Movements Sitting;5 reps   Neck Movements Sitting;5 reps   Back Extension Standing;5 reps   Trunk Movements Standing;5 reps   Ankle Movements Sitting;10 reps   Knee Extensor 5 reps   Knee Flexor 5 reps   Hip ABductor 5 reps   Ankle Plantorflexors 20 reps, support   Ankle Dorsiflexors 20 reps, support   Knee Bends 10 reps, support   Backwards Walking Support   Sideways Walking Assistive device   Tandem Stance 10 seconds, support   Tandem Walk Support   One Leg Stand 10 seconds, support  3 seconds at a time   Sit to Stand 10 reps, bilateral support   Overall OTAGO Comments provided booklet to patient and instructed to perform before next visit but does not have to do all exercises at once;Instructed to hold support with all exercises and not perform if she does not feel safe           PT Education - 03/20/15 1051    Education provided Yes   Education Details OTAGO   Person(s) Educated Patient   Methods Explanation;Demonstration;Verbal cues;Handout   Comprehension Verbalized understanding          PT Short Term Goals - 03/09/15 1142    PT SHORT TERM GOAL #1   Title independent with initial HEP   Time 2   Period Weeks   Status New           PT Long Term Goals - 03/09/15 1142    PT LONG TERM GOAL #1   Title decrease HA/neck pain by 50%   Time 8   Period Weeks   Status New   PT LONG TERM GOAL #2   Title decrease TUG time to 19 seconds   Time 8   Period Weeks   Status New   PT LONG TERM GOAL #3   Title increase Berg balance score to 46/56   Time 8   Period Weeks   Status New   PT LONG TERM GOAL #4   Title increase LE strength to 4/5   Time 8   Period Weeks   Status New   PT LONG TERM GOAL #5   Title have less than 2 falls over a 5 week period   Time 8   Period Weeks   Status New  Plan - 03/20/15 1052    Clinical  Impression Statement Pt continues to need seated rest breaks during session.  Completed OTAGO and provided fo home use.  Continue PT per POC.   Pt will benefit from skilled therapeutic intervention in order to improve on the following deficits Abnormal gait;Cardiopulmonary status limiting activity;Decreased activity tolerance;Decreased balance;Decreased mobility;Decreased range of motion;Decreased strength;Difficulty walking;Increased muscle spasms   PT Frequency 2x / week   PT Treatment/Interventions Moist Heat;Electrical Stimulation;Functional mobility training;Therapeutic activities;Therapeutic exercise;Patient/family education;Balance training;Gait training;Stair training   PT Next Visit Plan continue balance, gait and mobility   Consulted and Agree with Plan of Care Patient        Problem List Patient Active Problem List   Diagnosis Date Noted  . Constipation 03/04/2015  . Skin tear of right upper arm without complication [id number]  . Encounter for medication monitoring 02/20/2015  . Acute left flank pain 02/20/2015  . Fall 02/08/2015  . Knee pain, bilateral 02/08/2015  . Acute right hip pain 02/08/2015  . Frequent falls 01/22/2015  . Gastritis   . Encounter for screening mammogram for breast cancer 11/27/2014  . Left hip pain 11/22/2014  . 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 Chronic interstitial lung disease ([place]) 10/26/2014  . Centrilobular emphysema ([place]) 10/26/2014  . Oxygen desaturation 10/26/2014  . Dysphagia 10/18/2014  . Unilateral headache 10/02/2014  . Vision blurred 09/28/2014  . Acquired exophthalmos 09/28/2014  . Diabetes mellitus with diabetic neuropathy ([place]) 08/31/2014  . Hammertoe 08/31/2014  . Foot callus 08/31/2014  . 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 Anemia 05/21/2014  . Chest pain 05/20/2014  . Chronic pain 03/02/2014  . Clinical depression 03/02/2014  . Acid reflux 03/02/2014  . Glaucoma 03/02/2014  . Adult hypothyroidism 03/02/2014  .  Adiposity 03/02/2014  . Inflamed nasal mucosa 03/02/2014  . Apnea, sleep 03/02/2014  . PAT (paroxysmal atrial tachycardia) ([place]) 02/24/2014  . Unstable angina ([place]) 02/23/2014  . Type 2 diabetes with nephropathy ([place]) 02/23/2014  . HTN (hypertension) 02/23/2014  . CAD S/P remote PCI  02/23/2014  . High cholesterol 02/23/2014  . PVD- h/o LCEA 02/23/2014  . Obesity (BMI 30-39.9) 02/23/2014  . Aortic stenosis murmur 02/23/2014  . COPD, moderate ([place]) 07/27/2013    Narda Bonds 03/20/2015, 10:55 AM  [facility] [address] [place], [place], [address] Phone: [phone]   Fax:  [phone]  Name: Bonnie Henry MRN: [id number] Date of Birth: May 17, 1940   Narda Bonds, [place] 03/20/2015 10:55 AM Phone: [phone] Fax: [phone]

## 2015-03-20 NOTE — Patient Instructions (Signed)
Provided OTAGO booklet for HEP as performed this treatment

## 2015-03-22 ENCOUNTER — Ambulatory Visit: Payer: Medicare Other | Admitting: Physical Therapy

## 2015-03-22 DIAGNOSIS — R269 Unspecified abnormalities of gait and mobility: Secondary | ICD-10-CM | POA: Diagnosis not present

## 2015-03-22 DIAGNOSIS — R609 Edema, unspecified: Secondary | ICD-10-CM | POA: Diagnosis not present

## 2015-03-22 DIAGNOSIS — I6523 Occlusion and stenosis of bilateral carotid arteries: Secondary | ICD-10-CM | POA: Diagnosis not present

## 2015-03-22 DIAGNOSIS — E119 Type 2 diabetes mellitus without complications: Secondary | ICD-10-CM | POA: Diagnosis not present

## 2015-03-22 DIAGNOSIS — I6529 Occlusion and stenosis of unspecified carotid artery: Secondary | ICD-10-CM | POA: Diagnosis not present

## 2015-03-22 DIAGNOSIS — M542 Cervicalgia: Secondary | ICD-10-CM | POA: Diagnosis not present

## 2015-03-22 DIAGNOSIS — E785 Hyperlipidemia, unspecified: Secondary | ICD-10-CM | POA: Diagnosis not present

## 2015-03-22 DIAGNOSIS — J45909 Unspecified asthma, uncomplicated: Secondary | ICD-10-CM | POA: Diagnosis not present

## 2015-03-22 DIAGNOSIS — R5381 Other malaise: Secondary | ICD-10-CM | POA: Diagnosis not present

## 2015-03-22 DIAGNOSIS — E669 Obesity, unspecified: Secondary | ICD-10-CM | POA: Diagnosis not present

## 2015-03-22 DIAGNOSIS — I10 Essential (primary) hypertension: Secondary | ICD-10-CM | POA: Diagnosis not present

## 2015-03-22 DIAGNOSIS — M1990 Unspecified osteoarthritis, unspecified site: Secondary | ICD-10-CM | POA: Diagnosis not present

## 2015-03-22 DIAGNOSIS — R296 Repeated falls: Secondary | ICD-10-CM | POA: Diagnosis not present

## 2015-03-22 NOTE — Therapy (Signed)
[facility] [address] [place], [place], [address] Phone: [phone]   Fax:  [phone]  Physical Therapy Treatment  Patient Details  Name: Bonnie Henry MRN: [id number] Date of Birth: [birth year] Referring Provider: Sanda Klein  Encounter Date: 03/22/2015      PT End of Session - 03/22/15 0952    Visit Number 4   Date for PT Re-Evaluation 05/09/15   Authorization Type Medicare   PT Start Time 0805   PT Stop Time [id number]   PT Time Calculation (min) 44 min   Equipment Utilized During Treatment Oxygen   Activity Tolerance Patient limited by pain;Patient limited by fatigue   Behavior During Therapy [facility] for tasks assessed/performed      Past Medical History  Diagnosis Date  . Diabetes mellitus without complication ([place])   . Atrial fibrillation ([place])   . Hypertension   . GERD (gastroesophageal reflux disease)   . CAD (coronary artery disease)   . Hypercholesteremia   . COPD (chronic obstructive pulmonary disease) ([place])   . Morbid obesity ([place])   . Post-surgical hypothyroidism   . Depression, major ([place])   . Neuropathy ([place])   . Asthma   . Sleep apnea     "2015 wore mask; lost weight; told didn't need mask anymore" (02/23/2014)  . Type II diabetes mellitus ([place])   . Anxiety   . Anemia   . History of blood transfusion     "when I had my mastectomy"  . Arthritis     "joints ache all over"   . Chronic back pain   . Breast cancer, left breast (HCC)     S/P mastectomy  . Myocardial infarction ([place])   . Shortness of breath dyspnea   . Heart murmur   . Headache   . History of hiatal hernia   . Constipation 03/04/2015    Past Surgical History  Procedure Laterality Date  . Bartholin cyst marsupialization    . Trigger finger release Left   . Cataract extraction w/ intraocular lens  implant, bilateral Bilateral ~ 2011  . Total knee arthroplasty Left 1990's  . Total hip arthroplasty Bilateral 1990's  .  Thyroidectomy, partial  1950's  . Shoulder surgery Right     "cleaned it out; no scope"  . Toe surgery Right     "cut piece of bone out so toe didn't dig into my foot"  . Carotid endarterectomy Left 2009  . Hammer toe surgery Left     2nd and 3rd digits  . Tonsillectomy  1950's  . Joint replacement    . Breast biopsy Left   . Mastectomy Left 1990's  . Tubal ligation    . Vaginal hysterectomy      "partial"  . Coronary angioplasty with stent placement  1990's X 2    "2; 1"  . Cardiac catheterization  2000's  . Left heart catheterization with coronary angiogram N/A 02/24/2014    Procedure: LEFT HEART CATHETERIZATION WITH CORONARY ANGIOGRAM;  Surgeon: Leonie Man, MD;  Location: [facility] CATH LAB;  Service: Cardiovascular;  Laterality: N/A;  . Artery biopsy Right 09/29/2014    Procedure: BIOPSY TEMPORAL ARTERY;  Surgeon: Angelia Mould, MD;  Location: [facility] OR;  Service: Vascular;  Laterality: Right;  . Esophagogastroduodenoscopy (egd) with propofol N/A 12/12/2014    Procedure: ESOPHAGOGASTRODUODENOSCOPY (EGD) WITH PROPOFOL;  Surgeon: Lucilla Lame, MD;  Location: [facility] ENDOSCOPY;  Service: Endoscopy;  Laterality: N/A;    There were no vitals filed for this visit.  Visit Diagnosis:  Debility      Subjective Assessment - 03/22/15 0807    Subjective Pt reports being sore after exercises from Tuesday.  Did not do HEP yesterday.   Patient Stated Goals fall less   Currently in Pain? Yes   Pain Score 4    Pain Location Knee   Pain Orientation Right   Pain Descriptors / Indicators Sharp;Stabbing;Aching   Pain Type Chronic pain   Pain Onset More than a month ago   Pain Frequency Occasional   Aggravating Factors  walking, exercise   Pain Relieving Factors rest   Multiple Pain Sites Yes   Pain Score 4   Pain Location Back   Pain Descriptors / Indicators Aching   Pain Type Chronic pain   Pain Onset More than a month ago   Pain Frequency Occasional   Aggravating Factors  up a lot    Pain Relieving Factors depends on the day                         [facility] Adult PT Treatment/Exercise - 03/22/15 0001    Lumbar Exercises: Stretches   Single Knee to Chest Stretch 2 reps;30 seconds   Pelvic Tilt Other (comment);10 seconds  10 reps   Lumbar Exercises: Supine   Bridge 10 reps;3 seconds   Other Supine Lumbar Exercises small red ball for hip flexion/extension AAROM to control ball x 10   Knee/Hip Exercises: Seated   Marching Both;2 sets;10 reps;Weights   Marching Weights 2 lbs.   Knee/Hip Exercises: Supine   Short Arc Quad Sets Both;2 sets;10 reps;Other (comment)  2# weight   Heel Slides Both;1 set;10 reps;Other (comment)  2# weight                  PT Short Term Goals - 03/09/15 1142    PT SHORT TERM GOAL #1   Title independent with initial HEP   Time 2   Period Weeks   Status New           PT Long Term Goals - 03/09/15 1142    PT LONG TERM GOAL #1   Title decrease HA/neck pain by 50%   Time 8   Period Weeks   Status New   PT LONG TERM GOAL #2   Title decrease TUG time to 19 seconds   Time 8   Period Weeks   Status New   PT LONG TERM GOAL #3   Title increase Berg balance score to 46/56   Time 8   Period Weeks   Status New   PT LONG TERM GOAL #4   Title increase LE strength to 4/5   Time 8   Period Weeks   Status New   PT LONG TERM GOAL #5   Title have less than 2 falls over a 5 week period   Time 8   Period Weeks   Status New               Plan - 03/22/15 [id number]    Clinical Impression Statement Pt continues to need rest breaks even with seated and supine exercises.  Limited by endurance and c/o pain with some activities.  Pt describes pain as muscle soreness.  Continue PT per POC.   Pt will benefit from skilled therapeutic intervention in order to improve on the following deficits Abnormal gait;Cardiopulmonary status limiting activity;Decreased activity tolerance;Decreased balance;Decreased mobility;Decreased  range of motion;Decreased strength;Difficulty walking;Increased muscle spasms   PT  Frequency 2x / week   PT Treatment/Interventions Moist Heat;Electrical Stimulation;Functional mobility training;Therapeutic activities;Therapeutic exercise;Patient/family education;Balance training;Gait training;Stair training   PT Next Visit Plan Try foot pedaler in clinic as pt has one at home but doesnt feel like she positions herself properly and unsure of her tolerance.  Modalities for neck?   Consulted and Agree with Plan of Care Patient        Problem List Patient Active Problem List   Diagnosis Date Noted  . Constipation 03/04/2015  . Skin tear of right upper arm without complication [id number]  . Encounter for medication monitoring 02/20/2015  . Acute left flank pain 02/20/2015  . Fall 02/08/2015  . Knee pain, bilateral 02/08/2015  . Acute right hip pain 02/08/2015  . Frequent falls 01/22/2015  . Gastritis   . Encounter for screening mammogram for breast cancer 11/27/2014  . Left hip pain 11/22/2014  . 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 Chronic interstitial lung disease ([place]) 10/26/2014  . Centrilobular emphysema ([place]) 10/26/2014  . Oxygen desaturation 10/26/2014  . Dysphagia 10/18/2014  . Unilateral headache 10/02/2014  . Vision blurred 09/28/2014  . Acquired exophthalmos 09/28/2014  . Diabetes mellitus with diabetic neuropathy ([place]) 08/31/2014  . Hammertoe 08/31/2014  . Foot callus 08/31/2014  . 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 Anemia 05/21/2014  . Chest pain 05/20/2014  . Chronic pain 03/02/2014  . Clinical depression 03/02/2014  . Acid reflux 03/02/2014  . Glaucoma 03/02/2014  . Adult hypothyroidism 03/02/2014  . Adiposity 03/02/2014  . Inflamed nasal mucosa 03/02/2014  . Apnea, sleep 03/02/2014  . PAT (paroxysmal atrial tachycardia) ([place]) 02/24/2014  . Unstable angina ([place]) 02/23/2014  . Type 2 diabetes with nephropathy ([place]) 02/23/2014  . HTN (hypertension)  02/23/2014  . CAD S/P remote PCI  02/23/2014  . High cholesterol 02/23/2014  . PVD- h/o LCEA 02/23/2014  . Obesity (BMI 30-39.9) 02/23/2014  . Aortic stenosis murmur 02/23/2014  . COPD, moderate ([place]) 07/27/2013    Narda Bonds 03/22/2015, 9:55 AM  [facility] [address] [place], [place], [address] Phone: [phone]   Fax:  [phone]  Name: Bonnie Henry MRN: [id number] Date of Birth: October 26, 1940    Narda Bonds, [place] 03/22/2015 9:55 AM Phone: [phone] Fax: [phone]

## 2015-03-23 ENCOUNTER — Other Ambulatory Visit: Payer: Self-pay | Admitting: Family Medicine

## 2015-03-27 ENCOUNTER — Other Ambulatory Visit: Payer: Self-pay | Admitting: Family Medicine

## 2015-03-27 ENCOUNTER — Ambulatory Visit: Payer: Medicare Other | Admitting: Physical Therapy

## 2015-03-27 DIAGNOSIS — M542 Cervicalgia: Secondary | ICD-10-CM

## 2015-03-27 DIAGNOSIS — R296 Repeated falls: Secondary | ICD-10-CM

## 2015-03-27 DIAGNOSIS — R5381 Other malaise: Secondary | ICD-10-CM | POA: Diagnosis not present

## 2015-03-27 DIAGNOSIS — R269 Unspecified abnormalities of gait and mobility: Secondary | ICD-10-CM | POA: Diagnosis not present

## 2015-03-27 NOTE — Telephone Encounter (Signed)
Your patient 

## 2015-03-27 NOTE — Telephone Encounter (Signed)
She sees endo for diabetes; should get insulin from endocrinologist

## 2015-03-27 NOTE — Therapy (Addendum)
[facility] [address] [place], [place], [address] Phone: [phone]   Fax:  [phone]  Physical Therapy Treatment  Patient Details  Name: Bonnie Henry MRN: [id number] Date of Birth: [birth year] Referring Provider: Sanda Klein  Encounter Date: 03/27/2015      PT End of Session - 03/27/15 1206    Visit Number 5   Authorization Type Medicare   PT Start Time 1100   PT Stop Time 1145   PT Time Calculation (min) 45 min   Equipment Utilized During Treatment Oxygen   Activity Tolerance Patient limited by pain;Patient limited by fatigue   Behavior During Therapy [facility] for tasks assessed/performed      Past Medical History  Diagnosis Date  . Diabetes mellitus without complication ([place])   . Atrial fibrillation ([place])   . Hypertension   . GERD (gastroesophageal reflux disease)   . CAD (coronary artery disease)   . Hypercholesteremia   . COPD (chronic obstructive pulmonary disease) ([place])   . Morbid obesity ([place])   . Post-surgical hypothyroidism   . Depression, major ([place])   . Neuropathy ([place])   . Asthma   . Sleep apnea     "2015 wore mask; lost weight; told didn't need mask anymore" (02/23/2014)  . Type II diabetes mellitus ([place])   . Anxiety   . Anemia   . History of blood transfusion     "when I had my mastectomy"  . Arthritis     "joints ache all over"   . Chronic back pain   . Breast cancer, left breast ([facility])     S/P mastectomy  . Myocardial infarction ([place])   . Shortness of breath dyspnea   . Heart murmur   . Headache   . History of hiatal hernia   . Constipation 03/04/2015    Past Surgical History  Procedure Laterality Date  . Bartholin cyst marsupialization    . Trigger finger release Left   . Cataract extraction w/ intraocular lens  implant, bilateral Bilateral ~ 2011  . Total knee arthroplasty Left 1990's  . Total hip arthroplasty Bilateral 1990's  . Thyroidectomy, partial  1950's  .  Shoulder surgery Right     "cleaned it out; no scope"  . Toe surgery Right     "cut piece of bone out so toe didn't dig into my foot"  . Carotid endarterectomy Left 2009  . Hammer toe surgery Left     2nd and 3rd digits  . Tonsillectomy  1950's  . Joint replacement    . Breast biopsy Left   . Mastectomy Left 1990's  . Tubal ligation    . Vaginal hysterectomy      "partial"  . Coronary angioplasty with stent placement  1990's X 2    "2; 1"  . Cardiac catheterization  2000's  . Left heart catheterization with coronary angiogram N/A 02/24/2014    Procedure: LEFT HEART CATHETERIZATION WITH CORONARY ANGIOGRAM;  Surgeon: Leonie Man, MD;  Location: [facility] CATH LAB;  Service: Cardiovascular;  Laterality: N/A;  . Artery biopsy Right 09/29/2014    Procedure: BIOPSY TEMPORAL ARTERY;  Surgeon: Angelia Mould, MD;  Location: [facility] OR;  Service: Vascular;  Laterality: Right;  . Esophagogastroduodenoscopy (egd) with propofol N/A 12/12/2014    Procedure: ESOPHAGOGASTRODUODENOSCOPY (EGD) WITH PROPOFOL;  Surgeon: Lucilla Lame, MD;  Location: [facility] ENDOSCOPY;  Service: Endoscopy;  Laterality: N/A;    There were no vitals filed for this visit.  Visit Diagnosis:  Debility  Falls  frequently  Gait disturbance  Neck pain      Subjective Assessment - 03/27/15 1104    Subjective doind well today; pain is a 10/10 when up walking without rollator   Currently in Pain? Yes   Pain Score 1    Pain Location Back  and neck   Pain Orientation Lower   Pain Descriptors / Indicators Aching   Pain Type Chronic pain   Pain Onset More than a month ago   Pain Frequency Occasional   Aggravating Factors  walking   Pain Relieving Factors rest                         [facility] Adult PT Treatment/Exercise - 03/27/15 1113    Exercises   Exercises Lumbar;Neck   Lumbar Exercises: Aerobic   Stationary Bike seated foot pedaler x 1:40 min; increased pain in back with exercise   Elliptical 7    Modalities   Modalities Electrical Stimulation;Moist Heat   Moist Heat Therapy   Number Minutes Moist Heat 15 Minutes   Moist Heat Location Lumbar Spine;Cervical   Electrical Stimulation   Electrical Stimulation Location neck; back   Electrical Stimulation Action premod/TENS   Electrical Stimulation Parameters to tolerance   Electrical Stimulation Goals Pain                PT Education - 03/27/15 1205    Education provided Yes   Education Details extensive discussion about PT and current progress; pt reports no change in pain or function since beginning PT; back to see MD on Friday.  at this time recommending d/c and home TENS unit   Person(s) Educated Patient   Methods Explanation   Comprehension Verbalized understanding          PT Short Term Goals - 03/09/15 1142    PT SHORT TERM GOAL #1   Title independent with initial HEP   Time 2   Period Weeks   Status New           PT Long Term Goals - 03/09/15 1142    PT LONG TERM GOAL #1   Title decrease HA/neck pain by 50%   Time 8   Period Weeks   Status New   PT LONG TERM GOAL #2   Title decrease TUG time to 19 seconds   Time 8   Period Weeks   Status New   PT LONG TERM GOAL #3   Title increase Berg balance score to 46/56   Time 8   Period Weeks   Status New   PT LONG TERM GOAL #4   Title increase LE strength to 4/5   Time 8   Period Weeks   Status New   PT LONG TERM GOAL #5   Title have less than 2 falls over a 5 week period   Time 8   Period Weeks   Status New               Plan - 03/27/15 1206    Clinical Impression Statement Session limited today and focused mainly on modalities for pain control.  Trialed TENS unit and pt reports relief in symptoms and pain with unit.  Significant discussion with pt about goals of PT and limited progress so far.  Pt feels PT has not been beneficial and requesting d/c at next visit.  Pt requesting TENS unit as this provided some relief.  Order given to  pt to have MD sign and  educated on how to purchase online.   PT Next Visit Plan check goals, plan for d/c   Consulted and Agree with Plan of Care Patient     Late Entry G-Code for 03/27/15 Functional Assessment Tool: Clinical Judgement Functional Limitation: Mobility: Walking and Moving Around Goal Status: CK Current Status: CL   Problem List Patient Active Problem List   Diagnosis Date Noted  . Constipation 03/04/2015  . Skin tear of right upper arm without complication 30/09/2328  . Encounter for medication monitoring 02/20/2015  . Acute left flank pain 02/20/2015  . Fall 02/08/2015  . Knee pain, bilateral 02/08/2015  . Acute right hip pain 02/08/2015  . Frequent falls 01/22/2015  . Gastritis   . Encounter for screening mammogram for breast cancer 11/27/2014  . Left hip pain 11/22/2014  . 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 Chronic interstitial lung disease ([place]) 10/26/2014  . Centrilobular emphysema ([place]) 10/26/2014  . Oxygen desaturation 10/26/2014  . Dysphagia 10/18/2014  . Unilateral headache 10/02/2014  . Vision blurred 09/28/2014  . Acquired exophthalmos 09/28/2014  . Diabetes mellitus with diabetic neuropathy ([place]) 08/31/2014  . Hammertoe 08/31/2014  . Foot callus 08/31/2014  . 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 Anemia 05/21/2014  . Chest pain 05/20/2014  . Chronic pain 03/02/2014  . Clinical depression 03/02/2014  . Acid reflux 03/02/2014  . Glaucoma 03/02/2014  . Adult hypothyroidism 03/02/2014  . Adiposity 03/02/2014  . Inflamed nasal mucosa 03/02/2014  . Apnea, sleep 03/02/2014  . PAT (paroxysmal atrial tachycardia) ([place]) 02/24/2014  . Unstable angina ([place]) 02/23/2014  . Type 2 diabetes with nephropathy ([place]) 02/23/2014  . HTN (hypertension) 02/23/2014  . CAD S/P remote PCI  02/23/2014  . High cholesterol 02/23/2014  . PVD- h/o LCEA 02/23/2014  . Obesity (BMI 30-39.9) 02/23/2014  . Aortic stenosis murmur 02/23/2014  . COPD, moderate ([place])  07/27/2013   Laureen Abrahams, PT, DPT 03/27/2015 12:09 PM  [facility] [address] [place], [place], [address] Phone: [phone]   Fax:  [phone]  Name: Jurnie Garritano MRN: [id number] Date of Birth: 05-29-1940      PHYSICAL THERAPY DISCHARGE SUMMARY  Visits from Start of Care: 5  Current functional level related to goals / functional outcomes: See above; no goals met.  Unable to assess goals as pt canceled appt scheduled.  Pt's daughter reports fall in bathroom at home after last PT session.  Daughter then canceled next appt and pt requested d/c at that time as plan was to d/c.  Pt with no change in function or symptoms since beginning PT and therefore requested d/c.  Recommended pt's daughter call MD to discuss fall and options.   Remaining deficits: See above; pt without change in function or symptoms since initial evaluation   Education / Equipment: HEP  Plan: Patient agrees to discharge.  Patient goals were not met. Patient is being discharged due to lack of progress.  ?????    Laureen Abrahams, PT, DPT 03/29/2015 9:40 AM  [facility] [address] [place], [place] [address]  [phone] (office) [phone] (fax)

## 2015-03-28 ENCOUNTER — Telehealth: Payer: Self-pay | Admitting: Family Medicine

## 2015-03-28 DIAGNOSIS — S8991XA Unspecified injury of right lower leg, initial encounter: Secondary | ICD-10-CM

## 2015-03-28 NOTE — Telephone Encounter (Signed)
Daughter called in and stated that the pt fell in the bathroom yesterday and fell up against the tub. She has a bruise on the R leg and she twisted her knee and would like to know if there is anything she could do since there are no available appts.

## 2015-03-28 NOTE — Telephone Encounter (Signed)
I'm sorry to hear that she fell after going to the bathroom  I called daughter Just tangled up pulling up pants, no LOC Sounded like something cracked in the right knee, hit against the tub, but she is weight-bearing Discussed options (urgent care, symptomatic care, xrays) She'd like xrays and plans to go tomorrow Right knee Discussed preventing DVT

## 2015-03-29 ENCOUNTER — Other Ambulatory Visit: Payer: Self-pay | Admitting: Family Medicine

## 2015-03-29 ENCOUNTER — Ambulatory Visit: Payer: Medicare Other | Admitting: Physical Therapy

## 2015-03-29 ENCOUNTER — Ambulatory Visit (HOSPITAL_COMMUNITY)
Admission: RE | Admit: 2015-03-29 | Discharge: 2015-03-29 | Disposition: A | Discharge status: Home / Self Care | Payer: Medicare Other | Source: Ambulatory Visit | Attending: Family Medicine | Admitting: Family Medicine

## 2015-03-29 DIAGNOSIS — M25561 Pain in right knee: Secondary | ICD-10-CM | POA: Diagnosis not present

## 2015-03-29 DIAGNOSIS — W19XXXA Unspecified fall, initial encounter: Secondary | ICD-10-CM | POA: Insufficient documentation

## 2015-03-29 DIAGNOSIS — S83011A Lateral subluxation of right patella, initial encounter: Secondary | ICD-10-CM | POA: Diagnosis not present

## 2015-03-29 DIAGNOSIS — M1711 Unilateral primary osteoarthritis, right knee: Secondary | ICD-10-CM | POA: Insufficient documentation

## 2015-03-29 DIAGNOSIS — S83003A Unspecified subluxation of unspecified patella, initial encounter: Secondary | ICD-10-CM | POA: Insufficient documentation

## 2015-03-29 DIAGNOSIS — S83001A Unspecified subluxation of right patella, initial encounter: Secondary | ICD-10-CM

## 2015-03-30 ENCOUNTER — Other Ambulatory Visit: Payer: Self-pay | Admitting: Family Medicine

## 2015-03-30 DIAGNOSIS — R413 Other amnesia: Secondary | ICD-10-CM | POA: Insufficient documentation

## 2015-03-30 DIAGNOSIS — G8929 Other chronic pain: Secondary | ICD-10-CM | POA: Insufficient documentation

## 2015-03-30 DIAGNOSIS — R519 Headache, unspecified: Secondary | ICD-10-CM | POA: Insufficient documentation

## 2015-03-30 DIAGNOSIS — R51 Headache: Secondary | ICD-10-CM

## 2015-03-30 MED ORDER — STANDARD TENS DEVI: 1.0000 [IU] | Freq: Every day | Status: DC

## 2015-03-30 NOTE — Assessment & Plan Note (Signed)
TENS unit for lower back

## 2015-03-30 NOTE — Progress Notes (Signed)
I spoke with daughter Explained knee xray; referral to [facility] Memory loss and headaches; refer to neuro PT had to stop; they suggested TENS unit; order printed and given to daughter

## 2015-04-03 ENCOUNTER — Ambulatory Visit: Payer: Self-pay | Admitting: Physical Therapy

## 2015-04-03 DIAGNOSIS — E1042 Type 1 diabetes mellitus with diabetic polyneuropathy: Secondary | ICD-10-CM | POA: Diagnosis not present

## 2015-04-05 ENCOUNTER — Ambulatory Visit: Payer: Self-pay | Admitting: Physical Therapy

## 2015-04-09 DIAGNOSIS — E119 Type 2 diabetes mellitus without complications: Secondary | ICD-10-CM | POA: Diagnosis not present

## 2015-04-09 DIAGNOSIS — R0789 Other chest pain: Secondary | ICD-10-CM | POA: Diagnosis not present

## 2015-04-09 DIAGNOSIS — Z794 Long term (current) use of insulin: Secondary | ICD-10-CM | POA: Diagnosis not present

## 2015-04-09 DIAGNOSIS — G4733 Obstructive sleep apnea (adult) (pediatric): Secondary | ICD-10-CM | POA: Diagnosis not present

## 2015-04-09 DIAGNOSIS — R0602 Shortness of breath: Secondary | ICD-10-CM | POA: Diagnosis not present

## 2015-04-09 DIAGNOSIS — I10 Essential (primary) hypertension: Secondary | ICD-10-CM | POA: Diagnosis not present

## 2015-04-09 DIAGNOSIS — R002 Palpitations: Secondary | ICD-10-CM | POA: Diagnosis not present

## 2015-04-10 ENCOUNTER — Ambulatory Visit: Payer: Self-pay | Admitting: Physical Therapy

## 2015-04-10 DIAGNOSIS — M542 Cervicalgia: Secondary | ICD-10-CM | POA: Diagnosis not present

## 2015-04-10 DIAGNOSIS — M25561 Pain in right knee: Secondary | ICD-10-CM | POA: Diagnosis not present

## 2015-04-11 ENCOUNTER — Other Ambulatory Visit: Payer: Self-pay | Admitting: Unknown Physician Specialty

## 2015-04-11 NOTE — Telephone Encounter (Signed)
Your patient.  Thanks 

## 2015-04-11 NOTE — Telephone Encounter (Signed)
Your patient 

## 2015-04-11 NOTE — Telephone Encounter (Signed)
Last sgpt reviewed; rx approved 

## 2015-04-12 ENCOUNTER — Ambulatory Visit: Payer: Self-pay | Admitting: Physical Therapy

## 2015-04-13 ENCOUNTER — Ambulatory Visit (INDEPENDENT_AMBULATORY_CARE_PROVIDER_SITE_OTHER): Payer: Medicare Other | Admitting: Neurology

## 2015-04-13 ENCOUNTER — Encounter: Payer: Self-pay | Admitting: Neurology

## 2015-04-13 ENCOUNTER — Other Ambulatory Visit: Payer: Self-pay

## 2015-04-13 VITALS — BP 130/70 | HR 64 | Ht 60.0 in | Wt 194.5 lb

## 2015-04-13 DIAGNOSIS — I6523 Occlusion and stenosis of bilateral carotid arteries: Secondary | ICD-10-CM | POA: Diagnosis not present

## 2015-04-13 DIAGNOSIS — Z79899 Other long term (current) drug therapy: Secondary | ICD-10-CM

## 2015-04-13 DIAGNOSIS — R2689 Other abnormalities of gait and mobility: Secondary | ICD-10-CM

## 2015-04-13 DIAGNOSIS — E0842 Diabetes mellitus due to underlying condition with diabetic polyneuropathy: Secondary | ICD-10-CM

## 2015-04-13 DIAGNOSIS — D649 Anemia, unspecified: Secondary | ICD-10-CM

## 2015-04-13 DIAGNOSIS — R4189 Other symptoms and signs involving cognitive functions and awareness: Secondary | ICD-10-CM | POA: Diagnosis not present

## 2015-04-13 DIAGNOSIS — I6521 Occlusion and stenosis of right carotid artery: Secondary | ICD-10-CM

## 2015-04-13 DIAGNOSIS — R51 Headache: Secondary | ICD-10-CM | POA: Diagnosis not present

## 2015-04-13 DIAGNOSIS — R519 Headache, unspecified: Secondary | ICD-10-CM

## 2015-04-13 MED ORDER — TIZANIDINE HCL 2 MG PO TABS: 2.0000 mg | ORAL_TABLET | Freq: Every day | ORAL | Status: DC

## 2015-04-13 NOTE — Telephone Encounter (Signed)
No return call from pt. Mailed lab order for pt to go and have repeat iron studies done.

## 2015-04-13 NOTE — Progress Notes (Signed)
[facility] Clinic Note - Initial Visit   Date: 04/13/2015  Bonnie Henry MRN: [id number] DOB: 1940/12/15   Dear Dr. Sanda Klein:  Thank you for your kind referral of Bonnie Henry for consultation of memory impairment and headaches. Although her history is well known to you, please allow [place] to reiterate it for the purpose of our medical record. The patient was accompanied to the clinic by daughter who also provides collateral information.     History of Present Illness: Bonnie Henry is a 75 y.o. right-handed Caucasian female with insulin-dependent diabetes, COPD on oxygen, left breast cancer s/p mastectomy, severe RICA stenosis, hyperlipidemia, and depression presenting for evaluation of memory impairment, headaches, and falls.   Starting around 4 years ago, she began having issues with imbalance, short-term memory problems, and falls. Symptoms severely worsened in February 2017 after sustaining a fall and hitting her head.  One fall occurred when walking into her bedroom and other fall occurred when she rolled out of her bed.  There was no loss of consciousness.  Since this time, daughter has noticed that she forgets things much easier than before.   She has to be prompted and reminded of appointments frequently. She misplaces objects frequency and repeats herself often. She bathes and dresses herself, but daughter helps if she is feeling unwell.  She does not cook or manage finances. She has fallen 6 times since February and feel imbalanced all the time.  She has a cane and walker for home and uses a wheelchair for long distances.  She has been using a cane since 2008.    She has generalized pain all over, including her back, hips, and knees.  She has numbness and burning sensation of the feet bilaterally.    Starting in January 2017, she developed shooting pain all over her head. Pain has become constant over the past week.  She has worsening  headaches with coughing.  Headache does not wake her up from sleeping.  He has tried tylenol and hydrocodone which does not help.  She denies any nausea or vomiting.    Out-side paper records, electronic medical record, and images have been reviewed where available and summarized as:  MRI cervical spine wo contrast 02/20/2015: 1. Cervical spondylosis and degenerative disc disease, causing mild impingement at the C4-5, C5-6, and C6-7 levels, as detailed above. 2. Suspected mild chronic ischemic microvascular white matter disease along the posterior pons, not changed from prior.  Lab Results  Component Value Date   HGBA1C 8.1* 08/31/2014   Lab Results  Component Value Date   TSH 4.970* 08/31/2014     Past Medical History  Diagnosis Date  . Diabetes mellitus without complication ([place])   . Atrial fibrillation ([place])   . Hypertension   . GERD (gastroesophageal reflux disease)   . CAD (coronary artery disease)   . Hypercholesteremia   . COPD (chronic obstructive pulmonary disease) ([place])   . Morbid obesity ([place])   . Post-surgical hypothyroidism   . Depression, major ([place])   . Neuropathy ([place])   . Asthma   . Sleep apnea     "2015 wore mask; lost weight; told didn't need mask anymore" (02/23/2014)  . Type II diabetes mellitus ([place])   . Anxiety   . Anemia   . History of blood transfusion     "when I had my mastectomy"  . Arthritis     "joints ache all over"   . Chronic back pain   . Breast  cancer, left breast ([place])     S/P mastectomy  . Myocardial infarction ([place])   . Shortness of breath dyspnea   . Heart murmur   . Headache   . History of hiatal hernia   . Constipation 03/04/2015    Past Surgical History  Procedure Laterality Date  . Bartholin cyst marsupialization    . Trigger finger release Left   . Cataract extraction w/ intraocular lens  implant, bilateral Bilateral ~ 2011  . Total knee arthroplasty Left 1990's  . Total hip arthroplasty Bilateral 1990's  .  Thyroidectomy, partial  1950's  . Shoulder surgery Right     "cleaned it out; no scope"  . Toe surgery Right     "cut piece of bone out so toe didn't dig into my foot"  . Carotid endarterectomy Left 2009  . Hammer toe surgery Left     2nd and 3rd digits  . Tonsillectomy  1950's  . Joint replacement    . Breast biopsy Left   . Mastectomy Left 1990's  . Tubal ligation    . Vaginal hysterectomy      "partial"  . Coronary angioplasty with stent placement  1990's X 2    "2; 1"  . Cardiac catheterization  2000's  . Left heart catheterization with coronary angiogram N/A 02/24/2014    Procedure: LEFT HEART CATHETERIZATION WITH CORONARY ANGIOGRAM;  Surgeon: Leonie Man, MD;  Location: [facility] CATH LAB;  Service: Cardiovascular;  Laterality: N/A;  . Artery biopsy Right 09/29/2014    Procedure: BIOPSY TEMPORAL ARTERY;  Surgeon: Angelia Mould, MD;  Location: [facility] OR;  Service: Vascular;  Laterality: Right;  . Esophagogastroduodenoscopy (egd) with propofol N/A 12/12/2014    Procedure: ESOPHAGOGASTRODUODENOSCOPY (EGD) WITH PROPOFOL;  Surgeon: Lucilla Lame, MD;  Location: [facility] ENDOSCOPY;  Service: Endoscopy;  Laterality: N/A;     Medications:  Outpatient Encounter Prescriptions as of 04/13/2015  Medication Sig Note  . acetaminophen (TYLENOL) 500 MG tablet Take 1,000 mg by mouth every 6 (six) hours as needed (pain).   Marland Kitchen acetylcysteine (MUCOMYST) 20 % nebulizer solution Inhale into the lungs. 11/13/2014: Received from: [facility]  . albuterol (PROAIR HFA) 108 (90 BASE) MCG/ACT inhaler Inhale 2 puffs into the lungs every 4 (four) hours as needed for wheezing or shortness of breath.    Marland Kitchen albuterol (PROVENTIL) (2.5 MG/3ML) 0.083% nebulizer solution Take 2.5 mg by nebulization every 4 (four) hours as needed for wheezing or shortness of breath.   Marland Kitchen aspirin EC 81 MG tablet Take 81 mg by mouth daily.   Marland Kitchen atorvastatin (LIPITOR) 40 MG tablet TAKE ONE TABLET BY MOUTH AT BEDTIME.   .  bumetanide (BUMEX) 1 MG tablet Take 1 mg by mouth daily. 10/23/2014: Received from: External Pharmacy Received Sig:   . busPIRone (BUSPAR) 15 MG tablet Take 1 tablet (15 mg total) by mouth 2 (two) times daily.   . carvedilol (COREG) 12.5 MG tablet Take 12.5 mg by mouth 2 (two) times daily with a meal.    . fluticasone (FLONASE) 50 MCG/ACT nasal spray Place 1 spray into both nostrils 2 (two) times daily. 10/23/2014: Received from: External Pharmacy Received Sig:   . gabapentin (NEURONTIN) 100 MG capsule One by mouth every morning, one every early afternoon, and two at bedtime   . HUMALOG 100 UNIT/ML injection Inject 12 Units into the skin 3 (three) times daily with meals. PER SLIDING SCALE   . HYDROcodone-acetaminophen (NORCO/VICODIN) 5-325 MG tablet Take 1 tablet by mouth every 6 (  six) hours as needed for moderate pain.   . Hypromellose (ARTIFICIAL TEARS OP) Place 1 drop into both eyes 2 (two) times daily as needed (dry eyes).   . insulin glargine (LANTUS) 100 UNIT/ML injection Inject 0.3 mLs (30 Units total) into the skin daily. (Patient taking differently: Inject 38 Units into the skin daily. ) 09/29/2014: MORNING  . lactulose (CHRONULAC) 10 GM/15ML solution Take 15 mLs (10 g total) by mouth 2 (two) times daily as needed for mild constipation.   Marland Kitchen levothyroxine (SYNTHROID, LEVOTHROID) 100 MCG tablet Take 1 tablet (100 mcg total) by mouth every other day. (alternate with the 112 mcg strength) 02/20/2015: Takes 110mcg Monday-Thursday  . levothyroxine (SYNTHROID, LEVOTHROID) 112 MCG tablet Take 1 tablet (112 mcg total) by mouth every other day. (alternate with the 100 mcg strength) 02/20/2015: Takes 112 mcgFriday-Sunday  . montelukast (SINGULAIR) 10 MG tablet Take 1 tablet (10 mg total) by mouth daily.   . Nerve Stimulator (STANDARD TENS) DEVI 1 Units by Does not apply route daily.   . nitroGLYCERIN (NITROSTAT) 0.4 MG SL tablet Place 0.4 mg under the tongue every 5 (five) minutes as needed for chest pain.  02/20/2015: Needs Refill   . omeprazole (PRILOSEC) 20 MG capsule TAKE ONE CAPSULE BY MOUTH TWICE DAILY AS NEEDED (USE  MAY  INCREASE  RISK OF PNEUMONIA, OSTEOPOROSIS, AND ANEMIA)   . promethazine (PHENERGAN) 12.5 MG tablet Take 1 tablet (12.5 mg total) by mouth every 6 (six) hours as needed for nausea or vomiting.   Marland Kitchen QUEtiapine (SEROQUEL) 25 MG tablet Take 0.5 tablets (12.5 mg total) by mouth at bedtime.   Marland Kitchen rOPINIRole (REQUIP) 1 MG tablet Take 1 tablet (1 mg total) by mouth 3 (three) times daily.   Marland Kitchen senna (SENOKOT) 8.6 MG TABS tablet Take 2 tablets by mouth at bedtime.    . sertraline (ZOLOFT) 100 MG tablet Take 1.5 tablets (150 mg total) by mouth daily.   Marland Kitchen spironolactone (ALDACTONE) 25 MG tablet Take 25 mg by mouth daily. 10/23/2014: Received from: External Pharmacy Received Sig:   . SYMBICORT 160-4.5 MCG/ACT inhaler INHALE TWO PUFFS BY MOUTH TWICE DAILY . RINSE MOUTH AFTER EACH USE   . tiotropium (SPIRIVA HANDIHALER) 18 MCG inhalation capsule Place 18 mcg into inhaler and inhale daily.   . [DISCONTINUED] amLODipine (NORVASC) 5 MG tablet Take 5 mg by mouth daily.    . [DISCONTINUED] doxycycline (VIBRA-TABS) 100 MG tablet Take 1 tablet (100 mg total) by mouth 2 (two) times daily.    No facility-administered encounter medications on file as of 04/13/2015.     Allergies:  Allergies  Allergen Reactions  . Vasotec [Enalapril] Swelling    Throat closes  . Enalapril Other (See Comments)    Angioedema  . Codeine Nausea And Vomiting  . Morphine And Related Nausea And Vomiting    Family History: Family History  Problem Relation Age of Onset  . Arthritis Mother   . Cancer Mother   . Diabetes Mother   . Heart disease Mother   . Hyperlipidemia Mother   . Hypertension Mother   . Alcohol abuse Father   . Alcohol abuse Brother   . Arthritis Brother   . Asthma Brother   . HIV Brother   . Cancer Brother   . Diabetes Brother   . Hyperlipidemia Brother   . Hypertension Brother   . Lung  disease Brother   . Arthritis Maternal Grandmother   . Brain cancer Father     Social History: Social History  Substance Use Topics  . Smoking status: Former Smoker -- 1.50 packs/day for 44 years    Types: Cigarettes    Quit date: 01/06/1994  . Smokeless tobacco: Never Used     Comment: "quit smoking cigarettes in ~ 2000"  . Alcohol Use: No     Comment: 02/23/2014 "no alcohol since 1990's"   Social History   Social History Narrative   Lives with daughter in a mobile home.  Has 4 children alive, 1 deceased.  Retired from [employer].  Education: high school.    Review of Systems:  CONSTITUTIONAL: No fevers, chills, night sweats, or weight loss.   EYES: No visual changes or eye pain ENT: No hearing changes.  No history of nose bleeds.   RESPIRATORY: No cough, wheezing +shortness of breath.   CARDIOVASCULAR: Negative for chest pain, and palpitations.   GI: Negative for abdominal discomfort, blood in stools or black stools.  No recent change in bowel habits.   GU:  No history of incontinence.   MUSCLOSKELETAL: +history of joint pain or swelling.  No myalgias.   SKIN: Negative for lesions, rash, and itching.   HEMATOLOGY/ONCOLOGY: Negative for prolonged bleeding, bruising easily, and swollen nodes.  No history of cancer.   ENDOCRINE: Negative for cold or heat intolerance, polydipsia or goiter.   PSYCH:  +depression or anxiety symptoms.   NEURO: As Above.   Vital Signs:  BP 130/70 mmHg  Pulse 64  Ht 5' (1.524 m)  Wt 194 lb 8 oz (88.225 kg)  BMI 37.99 kg/m2   General Medical Exam:   General:  Well appearing, comfortable on oxygen sitting in wheelchair.  Marland Kitchen  Neurological Exam: MENTAL STATUS including orientation to time, place, person, recent and remote memory, attention span and concentration, language, and fund of knowledge is fair.  Speech is not dysarthric.  Montreal Cognitive Assessment  04/13/2015  Visuospatial/ Executive (0/5) 2  Naming (0/3) 3  Attention: Read list of  digits (0/2) 1  Attention: Read list of letters (0/1) 1  Attention: Serial 7 subtraction starting at 100 (0/3) 0  Language: Repeat phrase (0/2) 1  Language : Fluency (0/1) 1  Abstraction (0/2) 0  Delayed Recall (0/5) 3  Orientation (0/6) 5  Total 17  Adjusted Score (based on education) 18    CRANIAL NERVES: II:  No visual field defects.  Unremarkable fundi.   III-IV-VI: Pupils equal round and reactive to light.  Normal conjugate, extra-ocular eye movements in all directions of gaze.  No nystagmus.  No ptosis.  Proptosis bilaterally.   V:  Normal facial sensation.     VII:  Normal facial symmetry and movements.   VIII:  Normal hearing and vestibular function.   IX-X:  Normal palatal movement.   XI:  Normal shoulder shrug and head rotation.   XII:  Normal tongue strength and range of motion, no deviation or fasciculation.  MOTOR:  Bilateral upper extremities with coarse arrhythmical tremors when arms out-stretched.  No pronator drift.  Tone is normal.    Right Upper Extremity:    Left Upper Extremity:    Deltoid  5/5   Deltoid  5/5   Biceps  5/5   Biceps  5/5   Triceps  5/5   Triceps  5/5   Wrist extensors  5/5   Wrist extensors  5/5   Wrist flexors  5/5   Wrist flexors  5/5   Finger extensors  5/5   Finger extensors  5/5   Finger flexors  5/5  Finger flexors  5/5   Dorsal interossei  5/5   Dorsal interossei  5/5   Abductor pollicis  5/5   Abductor pollicis  5/5   Tone (Ashworth scale)  0  Tone (Ashworth scale)  0   Right Lower Extremity:    Left Lower Extremity:    Hip flexors  5/5   Hip flexors  5/5   Hip extensors  5/5   Hip extensors  5/5   Knee flexors  5/5   Knee flexors  5/5   Knee extensors  5/5   Knee extensors  5/5   Dorsiflexors  5/5   Dorsiflexors  5/5   Plantarflexors  5/5   Plantarflexors  5/5   Toe extensors  5/5   Toe extensors  5/5   Toe flexors  5/5   Toe flexors  5/5   Tone (Ashworth scale)  0  Tone (Ashworth scale)  0   MSRs:  Right                                                                  Left brachioradialis 2+  brachioradialis 2+  biceps 2+  biceps 2+  triceps 2+  triceps 2+  patellar tr  patellar tr  ankle jerk 0  ankle jerk 0  Hoffman no  Hoffman no  plantar response down  plantar response down   SENSORY:  Reduced vibration at knees and absent distal to ankles bilaterally.  Pin prick and temperature also reduced in the lower legs.  COORDINATION/GAIT: Normal finger-to- nose-fingershin.  Intact rapid alternating movements bilaterally.  Gait not tested as she is in a wheelchair  IMPRESSION/PLAN: Cognitive impairment - etiology unclear, need to exclude intracranial process such as subdural hematoma.  She is on a number of medications which can contribute to cognitive dysfunction  - MRI brain wo contrast  - Formal neuropsychology testing Chronic daily headaches and medication overuse headaches  - Limit all rescue medication, including tylenol to twice per week   - Start tizanidine 2mg  at bedtime Multifactorial gait instability due to neuropathy, pain, and arthritis  - fall precautions discussed  - Continue home PT Diabetic neuropathy affecting the lower extremities  - She takes gabapentin 200mg  twice daily.  RLS for which she takes requip Severe right carotid stenosis undergoing surgical evaluation followed by cardiology and vascular surgery Polypharmacy  - She is on a number of medications for depression and mood disorder which can cause various side effects   Return to clinic in 3 months   The duration of this appointment visit was 60 minutes of face-to-face time with the patient.  Greater than 50% of this time was spent in counseling, explanation of diagnosis, planning of further management, and coordination of care.   Thank you for allowing me to participate in patient's care.  If I can answer any additional questions, I would be pleased to do so.    Sincerely,    Cici Rodriges K. Posey Pronto, DO

## 2015-04-13 NOTE — Patient Instructions (Addendum)
1.  MRI brain without contrast.  We will call you with the results  2.  Start tizanidine 2mg  at bedtime  3.  Limit tylenol to twice per week 4.  Return to clinic in 2-3 months

## 2015-04-20 ENCOUNTER — Ambulatory Visit: Payer: Self-pay | Admitting: Family Medicine

## 2015-04-25 ENCOUNTER — Ambulatory Visit
Admission: RE | Admit: 2015-04-25 | Discharge: 2015-04-25 | Disposition: A | Discharge status: Home / Self Care | Payer: Medicare Other | Source: Ambulatory Visit | Attending: Neurology | Admitting: Neurology

## 2015-04-25 DIAGNOSIS — R519 Headache, unspecified: Secondary | ICD-10-CM

## 2015-04-25 DIAGNOSIS — R51 Headache: Secondary | ICD-10-CM

## 2015-04-25 DIAGNOSIS — Z79899 Other long term (current) drug therapy: Secondary | ICD-10-CM

## 2015-04-25 DIAGNOSIS — R4189 Other symptoms and signs involving cognitive functions and awareness: Secondary | ICD-10-CM

## 2015-04-26 ENCOUNTER — Telehealth: Payer: Self-pay | Admitting: Neurology

## 2015-04-26 NOTE — Telephone Encounter (Signed)
Called and discussed results of MRI brain which showed moderate white matter changes and atrophy.  She will need to be scheduled for neuropsych evaluation.  Her stabbing headaches have improved, but she continues to have daily headaches.  Recommended to start magnesium oxide 400-600mg  daily as a preventative agent.  Bonnie K. Posey Pronto, DO

## 2015-04-27 NOTE — Telephone Encounter (Signed)
Referral placed.

## 2015-04-30 DIAGNOSIS — R0789 Other chest pain: Secondary | ICD-10-CM | POA: Diagnosis not present

## 2015-04-30 DIAGNOSIS — R0602 Shortness of breath: Secondary | ICD-10-CM | POA: Diagnosis not present

## 2015-05-04 ENCOUNTER — Other Ambulatory Visit: Payer: Self-pay | Admitting: Family Medicine

## 2015-05-04 ENCOUNTER — Encounter: Payer: Self-pay | Admitting: Family Medicine

## 2015-05-04 ENCOUNTER — Ambulatory Visit (INDEPENDENT_AMBULATORY_CARE_PROVIDER_SITE_OTHER): Payer: Medicare Other | Admitting: Family Medicine

## 2015-05-04 VITALS — BP 132/84 | HR 89 | Temp 99.2°F | Resp 16 | Ht 60.0 in | Wt 196.0 lb

## 2015-05-04 DIAGNOSIS — Z794 Long term (current) use of insulin: Secondary | ICD-10-CM

## 2015-05-04 DIAGNOSIS — R296 Repeated falls: Secondary | ICD-10-CM

## 2015-05-04 DIAGNOSIS — M545 Low back pain, unspecified: Secondary | ICD-10-CM

## 2015-05-04 DIAGNOSIS — R51 Headache: Secondary | ICD-10-CM | POA: Diagnosis not present

## 2015-05-04 DIAGNOSIS — R519 Headache, unspecified: Secondary | ICD-10-CM

## 2015-05-04 DIAGNOSIS — R1031 Right lower quadrant pain: Secondary | ICD-10-CM | POA: Diagnosis not present

## 2015-05-04 DIAGNOSIS — E1140 Type 2 diabetes mellitus with diabetic neuropathy, unspecified: Secondary | ICD-10-CM

## 2015-05-04 DIAGNOSIS — G319 Degenerative disease of nervous system, unspecified: Secondary | ICD-10-CM | POA: Diagnosis not present

## 2015-05-04 DIAGNOSIS — I6523 Occlusion and stenosis of bilateral carotid arteries: Secondary | ICD-10-CM

## 2015-05-04 DIAGNOSIS — E7800 Pure hypercholesterolemia, unspecified: Secondary | ICD-10-CM | POA: Diagnosis not present

## 2015-05-04 LAB — POCT URINALYSIS DIPSTICK
Bilirubin, UA: NEGATIVE
Glucose, UA: NEGATIVE
KETONES UA: NEGATIVE
Nitrite, UA: NEGATIVE
PH UA: 5.5
PROTEIN UA: NEGATIVE
SPEC GRAV UA: 1.02
UROBILINOGEN UA: 0.2

## 2015-05-04 MED ORDER — GABAPENTIN 300 MG PO CAPS: 300.0000 mg | ORAL_CAPSULE | Freq: Two times a day (BID) | ORAL | Status: DC

## 2015-05-04 MED ORDER — DOXYCYCLINE HYCLATE 100 MG PO TABS: 100.0000 mg | ORAL_TABLET | Freq: Two times a day (BID) | ORAL | Status: AC

## 2015-05-04 NOTE — Patient Instructions (Addendum)
Increase the gabapentin as follows: Friday and Saturday and Sunday you'll take 200 mg in the morning and 300 mg at bedtime Monday onward, you'll take 300 mg twice a day  Let's get a urine today and culture that  Do get the TENS unit and use the muscle relaxer Let's have you contact Dr. Sharlet Salina about your neck and back  Keep an eye on the scar tissue forming and avoid sun exposure on those places  Try to limit saturated fats in your diet (bologna, hot dogs, barbeque, cheeseburgers, hamburgers, steak, bacon, sausage, cheese, etc.) and get more fresh fruits, vegetables, and whole grains

## 2015-05-04 NOTE — Assessment & Plan Note (Signed)
I referred her to physical therapist, but they stopped therapy b/c it was not helping; reached max potential

## 2015-05-04 NOTE — Progress Notes (Signed)
BP 132/84 mmHg  Pulse 89  Temp(Src) 99.2 F (37.3 C) (Oral)  Resp 16  Ht 5' (1.524 m)  Wt 196 lb (88.905 kg)  BMI 38.28 kg/m2  SpO2 93%   Subjective:    Patient ID: Bonnie Henry, female    DOB: [birth year], 75 y.o.   MRN: [id number]  HPI: Bonnie Henry is a 74 y.o. female  Chief Complaint  Patient presents with  . Follow-up  . Migraine    worsening  . Abdominal Pain    left side    She says that her migraines aren't not going away; she is seeing Dr. Posey Pronto for headaches; had MRI done MR Brain Wo Contrast   Status: Final result       PACS Images     Show images for MR Brain Wo Contrast     Study Result     CLINICAL DATA: Fall hitting back of head 1-2 months ago. Persistent severe headaches. Cognitive impairment. New onset of headaches after age 19.  EXAM: MRI HEAD WITHOUT CONTRAST  TECHNIQUE: Multiplanar, multiecho pulse sequences of the brain and surrounding structures were obtained without intravenous contrast.  COMPARISON: CTA of the neck 03/15/2025.  FINDINGS: The diffusion-weighted images demonstrate no evidence for acute or subacute infarction. Moderate to severe diffuse white matter changes are present bilaterally. There is moderate generalized atrophy. White matter changes extend into the brainstem. The cerebellum is unremarkable. Ventricular dilation is proportional to the degree of atrophy. No significant extra-axial fluid collections are present.  The internal auditory canals are within normal limits bilaterally. Flow is present in the major intracranial arteries. Bilateral lens replacements are evident. Bilateral exophthalmos is noted. No mass lesion is present. The paranasal sinuses are clear. There is fluid in the left mastoid air cells. No obstructing nasopharyngeal lesion is evident.  The skullbase is within normal limits. Midline sagittal images are unremarkable.  IMPRESSION: 1. No acute or focal  abnormality to explain the patient's symptoms. 2. Moderate generalized atrophy and moderate to severe diffuse white matter disease. This likely reflects the sequela of chronic microvascular ischemia. 3. Stable exophthalmos.   Electronically Signed  By: [place] Morelle M.D.  On: 04/25/2015 19:36   Last A1c was "okay" at endo she reports (I found a 7.7 on 11/09/2014); not a smoker, not around 2nd hand smoke; BP is controlled; cardiologist stopped her CCB and upped her fluid pill; doing better with swelling; she thinks she wants to get the neck done; going to have carotid done; the one side was cleaned out, but the other side needs to be done; they will remove a section of carotid; they did stress test and cardiologist cleared her; they are going to see if the headaches improve after surgery; if not, then she'd like to re-evaluate with Dr. Sharlet Salina with the arthritis  She has side pain; left side a few months back; went away, got quiet, and now it's back; the whole area on the left side is very painful; makes her leg so weak you'll go down; last MRI of the back was done in Sept; last two injections have not helped; pain is in the left side of the back; no burning with urination; normal urine color; no blood in the stool or urine  Abnormal scarring over left arm, blistered thickeness, redder; not draining anything  Depression screen [facility] 2/9 05/04/2015 01/22/2015  Decreased Interest 0 3  Down, Depressed, Hopeless 0 0  PHQ - 2 Score 0 3  Altered sleeping - 3  Tired, decreased energy - 1  Change in appetite - 0  Feeling bad or failure about yourself  - 0  Trouble concentrating - 0  Moving slowly or fidgety/restless - 0  Suicidal thoughts - 0  PHQ-9 Score - 7  Difficult doing work/chores - Somewhat difficult   Relevant past medical, surgical, family and social history reviewed and updated as indicated. Interim medical history since our last visit reviewed. Allergies and medications  reviewed and updated.  Review of Systems Per HPI unless specifically indicated above     Objective:    BP 132/84 mmHg  Pulse 89  Temp(Src) 99.2 F (37.3 C) (Oral)  Resp 16  Ht 5' (1.524 m)  Wt 196 lb (88.905 kg)  BMI 38.28 kg/m2  SpO2 93%  Wt Readings from Last 3 Encounters:  05/16/15 205 lb 4 oz (93.1 kg)  05/08/15 196 lb (88.905 kg)  05/04/15 196 lb (88.905 kg)    Physical Exam  Constitutional: She appears well-developed and well-nourished.  Elderly, appears chronically ill; deconditioned, older than stated age; nontoxic; seated in wheelchair; gait not assessed  HENT:  Head: Normocephalic and atraumatic.  Eyes: EOM are normal. No scleral icterus.  Cardiovascular: Normal rate and regular rhythm.   Pulmonary/Chest: Effort normal and breath sounds normal.  Abdominal: Soft. She exhibits no distension.  obese  Neurological:  Gait not assessed; seated in wheelchair  Skin:  few ecchymoses on extensor surfaces of arms  Psychiatric: She has a normal mood and affect. Her mood appears not anxious. Her affect is not blunt. She does not exhibit a depressed mood.  Good eye contact with examiner      Assessment & Plan:   Problem List Items Addressed This Visit      Endocrine   Diabetes mellitus with diabetic neuropathy ([place])    Managed by endo        Nervous and Auditory   Diffuse cerebral atrophy    Noted on recent MRI; seen by neurologist        Other   Frequent falls    I referred her to physical therapist, but they stopped therapy b/c it was not helping; reached max potential      Frequent headaches    Managed by neurologist      Relevant Medications   gabapentin (NEURONTIN) 300 MG capsule   High cholesterol (Chronic)    Encouraged dist lower in saturated fats; see AVS      Low back pain - Primary   Relevant Orders   POCT urinalysis dipstick (Completed)    Other Visit Diagnoses    Right lower quadrant abdominal pain        suspect urinary tract  infection; see urine; start doxy; call if not imrpving    Relevant Orders    Urine Culture       Follow up plan: No Follow-up on file.  An after-visit summary was printed and given to the patient at [place].  Please see the patient instructions which may contain other information and recommendations beyond what is mentioned above in the assessment and plan.  Meds ordered this encounter  Medications  . gabapentin (NEURONTIN) 300 MG capsule    Sig: Take 1 capsule (300 mg total) by mouth 2 (two) times daily.    Dispense:  60 capsule    Refill:  3    Increasing dose  . doxycycline (VIBRA-TABS) 100 MG tablet    Sig: Take 1 tablet (100 mg total) by mouth 2 (two) times daily.  Dispense:  14 tablet    Refill:  0    Orders Placed This Encounter  Procedures  . Urine Culture  . POCT urinalysis dipstick

## 2015-05-07 ENCOUNTER — Other Ambulatory Visit: Payer: Self-pay | Admitting: Vascular Surgery

## 2015-05-07 LAB — URINE CULTURE: Organism ID, Bacteria: NO GROWTH

## 2015-05-08 ENCOUNTER — Emergency Department (HOSPITAL_COMMUNITY)
Admission: EM | Admit: 2015-05-08 | Discharge: 2015-05-08 | Disposition: A | Discharge status: Home / Self Care | Payer: Medicare Other | Attending: Emergency Medicine | Admitting: Emergency Medicine

## 2015-05-08 ENCOUNTER — Emergency Department (HOSPITAL_COMMUNITY): Payer: Medicare Other

## 2015-05-08 ENCOUNTER — Encounter
Admission: RE | Admit: 2015-05-08 | Discharge: 2015-05-08 | Disposition: A | Discharge status: Home / Self Care | Payer: Medicare Other | Source: Ambulatory Visit | Attending: Vascular Surgery | Admitting: Vascular Surgery

## 2015-05-08 ENCOUNTER — Encounter (HOSPITAL_COMMUNITY): Payer: Self-pay | Admitting: Emergency Medicine

## 2015-05-08 ENCOUNTER — Other Ambulatory Visit: Payer: Self-pay

## 2015-05-08 DIAGNOSIS — R8299 Other abnormal findings in urine: Secondary | ICD-10-CM | POA: Insufficient documentation

## 2015-05-08 DIAGNOSIS — I252 Old myocardial infarction: Secondary | ICD-10-CM | POA: Insufficient documentation

## 2015-05-08 DIAGNOSIS — Z79899 Other long term (current) drug therapy: Secondary | ICD-10-CM | POA: Insufficient documentation

## 2015-05-08 DIAGNOSIS — M1990 Unspecified osteoarthritis, unspecified site: Secondary | ICD-10-CM | POA: Diagnosis not present

## 2015-05-08 DIAGNOSIS — I10 Essential (primary) hypertension: Secondary | ICD-10-CM | POA: Insufficient documentation

## 2015-05-08 DIAGNOSIS — Z01812 Encounter for preprocedural laboratory examination: Secondary | ICD-10-CM | POA: Diagnosis not present

## 2015-05-08 DIAGNOSIS — Z862 Personal history of diseases of the blood and blood-forming organs and certain disorders involving the immune mechanism: Secondary | ICD-10-CM | POA: Insufficient documentation

## 2015-05-08 DIAGNOSIS — F419 Anxiety disorder, unspecified: Secondary | ICD-10-CM | POA: Diagnosis not present

## 2015-05-08 DIAGNOSIS — K5900 Constipation, unspecified: Secondary | ICD-10-CM | POA: Diagnosis not present

## 2015-05-08 DIAGNOSIS — E890 Postprocedural hypothyroidism: Secondary | ICD-10-CM | POA: Insufficient documentation

## 2015-05-08 DIAGNOSIS — I4891 Unspecified atrial fibrillation: Secondary | ICD-10-CM | POA: Insufficient documentation

## 2015-05-08 DIAGNOSIS — I509 Heart failure, unspecified: Secondary | ICD-10-CM | POA: Diagnosis not present

## 2015-05-08 DIAGNOSIS — Z87891 Personal history of nicotine dependence: Secondary | ICD-10-CM | POA: Insufficient documentation

## 2015-05-08 DIAGNOSIS — G629 Polyneuropathy, unspecified: Secondary | ICD-10-CM | POA: Insufficient documentation

## 2015-05-08 DIAGNOSIS — E7800 Pure hypercholesterolemia, unspecified: Secondary | ICD-10-CM | POA: Diagnosis not present

## 2015-05-08 DIAGNOSIS — I2510 Atherosclerotic heart disease of native coronary artery without angina pectoris: Secondary | ICD-10-CM | POA: Diagnosis not present

## 2015-05-08 DIAGNOSIS — F329 Major depressive disorder, single episode, unspecified: Secondary | ICD-10-CM | POA: Insufficient documentation

## 2015-05-08 DIAGNOSIS — Z853 Personal history of malignant neoplasm of breast: Secondary | ICD-10-CM | POA: Diagnosis not present

## 2015-05-08 DIAGNOSIS — G2581 Restless legs syndrome: Secondary | ICD-10-CM | POA: Diagnosis not present

## 2015-05-08 DIAGNOSIS — Z794 Long term (current) use of insulin: Secondary | ICD-10-CM | POA: Diagnosis not present

## 2015-05-08 DIAGNOSIS — Z7982 Long term (current) use of aspirin: Secondary | ICD-10-CM | POA: Diagnosis not present

## 2015-05-08 DIAGNOSIS — Z86718 Personal history of other venous thrombosis and embolism: Secondary | ICD-10-CM | POA: Insufficient documentation

## 2015-05-08 DIAGNOSIS — Z9861 Coronary angioplasty status: Secondary | ICD-10-CM | POA: Insufficient documentation

## 2015-05-08 DIAGNOSIS — R109 Unspecified abdominal pain: Secondary | ICD-10-CM | POA: Diagnosis not present

## 2015-05-08 DIAGNOSIS — E119 Type 2 diabetes mellitus without complications: Secondary | ICD-10-CM | POA: Diagnosis not present

## 2015-05-08 DIAGNOSIS — R5383 Other fatigue: Secondary | ICD-10-CM | POA: Diagnosis not present

## 2015-05-08 DIAGNOSIS — J449 Chronic obstructive pulmonary disease, unspecified: Secondary | ICD-10-CM | POA: Insufficient documentation

## 2015-05-08 DIAGNOSIS — K219 Gastro-esophageal reflux disease without esophagitis: Secondary | ICD-10-CM | POA: Diagnosis not present

## 2015-05-08 DIAGNOSIS — R011 Cardiac murmur, unspecified: Secondary | ICD-10-CM | POA: Insufficient documentation

## 2015-05-08 DIAGNOSIS — R300 Dysuria: Secondary | ICD-10-CM | POA: Insufficient documentation

## 2015-05-08 DIAGNOSIS — Z9889 Other specified postprocedural states: Secondary | ICD-10-CM | POA: Diagnosis not present

## 2015-05-08 DIAGNOSIS — Z8619 Personal history of other infectious and parasitic diseases: Secondary | ICD-10-CM | POA: Diagnosis not present

## 2015-05-08 DIAGNOSIS — G8929 Other chronic pain: Secondary | ICD-10-CM | POA: Diagnosis not present

## 2015-05-08 HISTORY — DX: Heart failure, unspecified: I50.9

## 2015-05-08 HISTORY — DX: Inflammatory liver disease, unspecified: K75.9

## 2015-05-08 HISTORY — DX: Restless legs syndrome: G25.81

## 2015-05-08 HISTORY — DX: Acute embolism and thrombosis of unspecified deep veins of unspecified lower extremity: I82.409

## 2015-05-08 HISTORY — DX: Peripheral vascular disease, unspecified: I73.9

## 2015-05-08 LAB — URINALYSIS, ROUTINE W REFLEX MICROSCOPIC
Bilirubin Urine: NEGATIVE
GLUCOSE, UA: NEGATIVE mg/dL
Hgb urine dipstick: NEGATIVE
Ketones, ur: NEGATIVE mg/dL
Nitrite: NEGATIVE
PH: 5.5 (ref 5.0–8.0)
PROTEIN: NEGATIVE mg/dL
Specific Gravity, Urine: 1.019 (ref 1.005–1.030)

## 2015-05-08 LAB — CBC WITH DIFFERENTIAL/PLATELET
BASOS ABS: 0.1 10*3/uL (ref 0–0.1)
Basophils Relative: 1 %
EOS PCT: 1 %
Eosinophils Absolute: 0.1 10*3/uL (ref 0–0.7)
HCT: 28.3 % — ABNORMAL LOW (ref 35.0–47.0)
Hemoglobin: 9 g/dL — ABNORMAL LOW (ref 12.0–16.0)
LYMPHS ABS: 1.2 10*3/uL (ref 1.0–3.6)
Lymphocytes Relative: 16 %
MCH: 23.9 pg — AB (ref 26.0–34.0)
MCHC: 31.9 g/dL — ABNORMAL LOW (ref 32.0–36.0)
MCV: 74.8 fL — ABNORMAL LOW (ref 80.0–100.0)
MONO ABS: 0.4 10*3/uL (ref 0.2–0.9)
Monocytes Relative: 5 %
Neutro Abs: 5.5 10*3/uL (ref 1.4–6.5)
Neutrophils Relative %: 77 %
PLATELETS: 189 10*3/uL (ref 150–440)
RBC: 3.78 MIL/uL — AB (ref 3.80–5.20)
RDW: 18.6 % — AB (ref 11.5–14.5)
WBC: 7.3 10*3/uL (ref 3.6–11.0)

## 2015-05-08 LAB — COMPREHENSIVE METABOLIC PANEL
ALT: 20 U/L (ref 14–54)
ANION GAP: 14 (ref 5–15)
AST: 26 U/L (ref 15–41)
Albumin: 4 g/dL (ref 3.5–5.0)
Alkaline Phosphatase: 101 U/L (ref 38–126)
BUN: 17 mg/dL (ref 6–20)
CHLORIDE: 95 mmol/L — AB (ref 101–111)
CO2: 29 mmol/L (ref 22–32)
Calcium: 9.2 mg/dL (ref 8.9–10.3)
Creatinine, Ser: 1.25 mg/dL — ABNORMAL HIGH (ref 0.44–1.00)
GFR, EST AFRICAN AMERICAN: 49 mL/min — AB (ref >60)
GFR, EST NON AFRICAN AMERICAN: 42 mL/min — AB (ref >60)
Glucose, Bld: 188 mg/dL — ABNORMAL HIGH (ref 65–99)
POTASSIUM: 3.4 mmol/L — AB (ref 3.5–5.1)
Sodium: 138 mmol/L (ref 135–145)
Total Bilirubin: 0.9 mg/dL (ref 0.3–1.2)
Total Protein: 7.5 g/dL (ref 6.5–8.1)

## 2015-05-08 LAB — CBC
HEMATOCRIT: 29.7 % — AB (ref 36.0–46.0)
Hemoglobin: 9 g/dL — ABNORMAL LOW (ref 12.0–15.0)
MCH: 23.7 pg — ABNORMAL LOW (ref 26.0–34.0)
MCHC: 30.3 g/dL (ref 30.0–36.0)
MCV: 78.2 fL (ref 78.0–100.0)
PLATELETS: 210 10*3/uL (ref 150–400)
RBC: 3.8 MIL/uL — AB (ref 3.87–5.11)
RDW: 17.3 % — ABNORMAL HIGH (ref 11.5–15.5)
WBC: 9.2 10*3/uL (ref 4.0–10.5)

## 2015-05-08 LAB — BASIC METABOLIC PANEL
Anion gap: 11 (ref 5–15)
BUN: 20 mg/dL (ref 6–20)
CO2: 33 mmol/L — ABNORMAL HIGH (ref 22–32)
CREATININE: 1.2 mg/dL — AB (ref 0.44–1.00)
Calcium: 9.1 mg/dL (ref 8.9–10.3)
Chloride: 97 mmol/L — ABNORMAL LOW (ref 101–111)
GFR calc Af Amer: 51 mL/min — ABNORMAL LOW (ref >60)
GFR, EST NON AFRICAN AMERICAN: 44 mL/min — AB (ref >60)
Glucose, Bld: 126 mg/dL — ABNORMAL HIGH (ref 65–99)
Potassium: 3.2 mmol/L — ABNORMAL LOW (ref 3.5–5.1)
SODIUM: 141 mmol/L (ref 135–145)

## 2015-05-08 LAB — URINE MICROSCOPIC-ADD ON: RBC / HPF: NONE SEEN RBC/hpf (ref 0–5)

## 2015-05-08 LAB — PROTIME-INR
INR: 1
PROTHROMBIN TIME: 13.4 s (ref 11.4–15.0)

## 2015-05-08 LAB — SURGICAL PCR SCREEN
MRSA, PCR: NEGATIVE
Staphylococcus aureus: POSITIVE — AB

## 2015-05-08 LAB — ABO/RH: ABO/RH(D): A POS

## 2015-05-08 LAB — APTT: APTT: 29 s (ref 24–36)

## 2015-05-08 LAB — LIPASE, BLOOD: LIPASE: 22 U/L (ref 11–51)

## 2015-05-08 MED ORDER — ACETAMINOPHEN 500 MG PO TABS: 1000.0000 mg | ORAL_TABLET | Freq: Four times a day (QID) | ORAL | Status: DC | PRN

## 2015-05-08 MED ORDER — CEPHALEXIN 250 MG PO CAPS
500.0000 mg | ORAL_CAPSULE | Freq: Once | ORAL | Status: DC
  Filled 2015-05-08: qty 2

## 2015-05-08 MED ORDER — FENTANYL CITRATE (PF) 100 MCG/2ML IJ SOLN
50.0000 ug | Freq: Once | INTRAMUSCULAR | Status: AC
  Administered 2015-05-08: 50 ug via INTRAVENOUS
  Filled 2015-05-08: qty 2

## 2015-05-08 MED ORDER — SODIUM CHLORIDE 0.9 % IV BOLUS (SEPSIS)
1000.0000 mL | Freq: Once | INTRAVENOUS | Status: AC
  Administered 2015-05-08: 1000 mL via INTRAVENOUS

## 2015-05-08 MED ORDER — ONDANSETRON HCL 4 MG/2ML IJ SOLN
4.0000 mg | Freq: Once | INTRAMUSCULAR | Status: AC
  Administered 2015-05-08: 4 mg via INTRAVENOUS
  Filled 2015-05-08: qty 2

## 2015-05-08 MED ORDER — CEPHALEXIN 500 MG PO CAPS: 500.0000 mg | ORAL_CAPSULE | Freq: Three times a day (TID) | ORAL | Status: DC

## 2015-05-08 NOTE — Pre-Procedure Instructions (Signed)
Met B results (low potassium) and Hem results faxed and called to Dr. Delana Meyer office.

## 2015-05-08 NOTE — ED Provider Notes (Signed)
Pt seen and evaluated. Discussed with Dr. Ellender Hose. Patient's CT is normal. Has normal findings or urine. No leukocytosis. No fever. Patient formerly suprapubic or left lower quadrant. She is nontender to examine. No peritoneal irritation. Anxious appropriate for discharge home. We'll continue and for UTI. We'll change from doxycycline, to Keflex. ER with any worsening or acute symptoms.  Tanna Furry, MD 05/08/15 2002

## 2015-05-08 NOTE — Pre-Procedure Instructions (Signed)
Anesthesia Note     Component Name Value Range  LV Ejection Fraction (%) 55   Aortic Valve Regurgitation Grade moderate   Aortic Valve Stenosis Grade moderate   Aortic Valve Max Velocity (m/s) 3.3 m/sec   Aortic Valve Stenosis Mean Gradient (mmHg) 24.5 mmHg   Mitral Valve Regurgitation Grade mild   Mitral Valve Stenosis Grade none   Tricuspid Valve Regurgitation Grade mild   Tricuspid Valve Regurgitation Max Velocity (m/s) 2.7 m/sec   Right Ventricle Systolic Pressure (mmHg) [id number] mmHg   LV End Diastolic Diameter (cm) 5.2 cm  LV End Systolic Diameter (cm) 3.7 cm  LV Septum Wall Thickness (cm) 1.3 cm  LV Posterior Wall Thickness (cm) 1.3 cm  Left Atrium Diameter (cm) 4.5 cm   Result Narrative                      CARDIOLOGY DEPARTMENT                        [facility]                           [id number]                   [facility]                       Acct #: [id number]         [address], [place], [place] [address]             Date: 04/30/2015 09:32 AM                                                                  Adult Female Age: 75 yrs                     ECHOCARDIOGRAM REPORT                        Outpatient                                                                  KC::KCWC          STUDY:CHEST WALL         TAPE:0000:00: 0:00:00          MD1:  FATH, KENNETH ALAN           ECHO:Yes   DOPPLER:Yes  FILE:0000-000-000              BP: 130/80 mmHg          COLOR:Yes  CONTRAST:No       MACHINE:Philips            Height: 60 in      RV  BIOPSY:No         3D:No    SOUND QLTY:Moderate           Weight: 197 lb         MEDIUM:None                                              BSA: 1.9 m2  ___________________________________________________________________________________________          HISTORY:DOE           REASON:Assess, LV function, Assess, Aortic Stenosis       INDICATION:R06.02 Shortness of  breath  ___________________________________________________________________________________________ ECHOCARDIOGRAPHIC MEASUREMENTS 2D DIMENSIONS AORTA             Values      Normal Range      MAIN PA          Values      Normal Range           Annulus:  nm*       [2.1 - 2.5]                PA Main:  nm*       [1.5 - 2.1]         Aorta Sin:  nm*       [2.7 - 3.3]       RIGHT VENTRICLE       ST Junction:  nm*       [2.3 - 2.9]                RV Base:  nm*       [ < 4.2]         Asc.Aorta:  nm*       [2.3 - 3.1]                 RV Mid:  nm*       [ < 3.5]  LEFT VENTRICLE                                        RV Length:  nm*       [ < 8.6]             LVIDd:  5.2 cm    [3.9 - 5.3]       INFERIOR VENA CAVA             LVIDs:  3.7 cm                              Max. IVC:  nm*       [ <= 2.1]                FS:  28.3 %    [> 25]                    Min. IVC:  nm*               SWT:  1.3 cm    [0.5 - 0.9]                   ------------------               PWT:  1.3 cm    [0.5 - 0.9]                   nm* - not measured  LEFT ATRIUM           LA Diam:  4.5 cm    [2.7 - 3.8]       LA A4C Area:  nm*       [ < 20]         LA Volume:  nm*       [22 - 52]  ___________________________________________________________________________________________ ECHOCARDIOGRAPHIC DESCRIPTIONS  AORTIC ROOT         Size:Normal   Dissection:INDETERM FOR DISSECTION  AORTIC VALVE     Leaflets:Tricuspid         Morphology:MILDLY THICKENED     Mobility:PARTIALLY MOBILE  LEFT VENTRICLE         Size:Normal              Anterior:Normal  Contraction:Normal               Lateral:Normal   Closest EF:>55% (Estimated)      Septal:Normal    LV Masses:No Masses             Apical:Normal          [id number] LVH            Inferior:Normal                                 Posterior:Normal Dias.FxClass:(Grade 2) relaxation abnormal, pseudonormal  MITRAL VALVE     Leaflets:Normal              Mobility:Fully mobile    Morphology:ANNULAR CALC  LEFT ATRIUM         Size:MILDLY ENLARGED    LA Masses:No masses    IA Septum:Normal IAS  MAIN PA         Size:Normal  PULMONIC VALVE   Morphology:Normal              Mobility:Fully mobile  RIGHT VENTRICLE    RV Masses:No Masses               Size:Normal    Free Wall:Normal           Contraction:Normal  TRICUSPID VALVE     Leaflets:Normal              Mobility:Fully mobile   Morphology:Normal  RIGHT ATRIUM         Size:MILDLY ENLARGED     RA Other:None      RA Mass:No masses  PERICARDIUM        Fluid:No effusion  INFERIOR VENACAVA         Size:Normal Normal respiratory collapse   _____________________________________________________________________ DOPPLER ECHO and OTHER SPECIAL PROCEDURES    Aortic:MODERATE AR                    MODERATE AS           334.0 cm/sec peak vel          44.6 mmHg peak grad           24.5 mmHg mean grad            0.97 cm^2 by DOPPLER     Mitral:MILD MR                        No  MS                                          2.6 cm^2 by DOPPLER           MV Inflow E Vel=127.0 cm/sec   MV Annulus E'Vel=4.8 cm/sec           E/E'Ratio=26.5  Tricuspid:MILD TR                        No TS           269.0 cm/sec peak TR vel       33.9 mmHg peak RV pressure  Pulmonary:TRIVIAL PR                     No PS     ___________________________________________________________________________________________ INTERPRETATION NORMAL LEFT VENTRICULAR SYSTOLIC FUNCTION   WITH MILD LVH NORMAL RIGHT VENTRICULAR SYSTOLIC FUNCTION MODERATE VALVULAR REGURGITATION (See above) MODERATE VALVULAR STENOSIS (See above) Morphology: MILDLY THICKENED Closest EF: >55% (Estimated) Morphology: ANNULAR CALC Size: MILDLY ENLARGED Aortic: MODERATE AR Tricuspid: MILD TR Mitral: MILD MR [id number] AS   ___________________________________________________________________________________________ Electronically signed by: MD Jordan Hawks on 05/02/2015  07:22 AM             Performed By: Johnathan Hausen, RDCS, RVT       Ordering Physician: Bartholome Bill ___________________________________________________________________________________________   Status Results Details

## 2015-05-08 NOTE — ED Notes (Signed)
Patient transported to X-ray 

## 2015-05-08 NOTE — Pre-Procedure Instructions (Signed)
Cardiac clearance received from Dr. Fath. 

## 2015-05-08 NOTE — ED Provider Notes (Signed)
CSN: [id number]     Arrival date & time 05/08/15  1324 History   First MD Initiated Contact with Patient 05/08/15 1635     Chief Complaint  Patient presents with  . Flank Pain  . Abdominal Pain     (Consider location/radiation/quality/duration/timing/severity/associated sxs/prior Treatment) HPI   75 year old female who presents with a four-day history of left flank pain. The patient states that over the last 4 days, she has had intermittent, sharp, reproducible left-sided flank pain. She was seen by her PCP several days ago and diagnosed with possible UTI. She was started on doxycycline for this. Since then, she has had persistent left-sided flank pain as well as mild suprapubic pain. She also states that she has had recent falls and feels that she made her flank in a fall. She states the pain is positional, sharp, and reproduced on palpation as well as twisting of her abdomen. She also endorses mild dysuria and malodor. Denies any fevers, nausea, vomiting. Denies any chest pain.  Past Medical History  Diagnosis Date  . Diabetes mellitus without complication ([place])   . Atrial fibrillation ([place])   . Hypertension   . GERD (gastroesophageal reflux disease)   . CAD (coronary artery disease)   . Hypercholesteremia   . COPD (chronic obstructive pulmonary disease) ([place])   . Morbid obesity ([place])   . Post-surgical hypothyroidism   . Depression, major ([place])   . Neuropathy ([place])   . Asthma   . Sleep apnea     "2015 wore mask; lost weight; told didn't need mask anymore" (02/23/2014)  . Type II diabetes mellitus ([place])   . Anxiety   . Anemia   . History of blood transfusion     "when I had my mastectomy"  . Chronic back pain   . Breast cancer, left breast ([facility])     S/P mastectomy  . Myocardial infarction ([place])   . Shortness of breath dyspnea   . Heart murmur   . Headache   . History of hiatal hernia   . Constipation 03/04/2015  . CHF (congestive heart failure) ([place])   . Peripheral  vascular disease ([place])   . Hepatitis     HEP "C"  . Arthritis     "joints ache all over" . osteoarthritis  . DVT (deep venous thrombosis) ([place])   . Restless leg syndrome    Past Surgical History  Procedure Laterality Date  . Bartholin cyst marsupialization    . Trigger finger release Left   . Cataract extraction w/ intraocular lens  implant, bilateral Bilateral ~ 2011  . Total knee arthroplasty Left 1990's  . Total hip arthroplasty Bilateral 1990's  . Thyroidectomy, partial  1950's  . Shoulder surgery Right     "cleaned it out; no scope"  . Toe surgery Right     "cut piece of bone out so toe didn't dig into my foot"  . Carotid endarterectomy Left 2009  . Hammer toe surgery Left     2nd and 3rd digits  . Tonsillectomy  1950's  . Mastectomy Left 1990's  . Tubal ligation    . Vaginal hysterectomy      "partial"  . Coronary angioplasty with stent placement  1990's X 2    "2; 1"  . Cardiac catheterization  2000's  . Left heart catheterization with coronary angiogram N/A 02/24/2014    Procedure: LEFT HEART CATHETERIZATION WITH CORONARY ANGIOGRAM;  Surgeon: Leonie Man, MD;  Location: [facility] CATH LAB;  Service: Cardiovascular;  Laterality: N/A;  .  Artery biopsy Right 09/29/2014    Procedure: BIOPSY TEMPORAL ARTERY;  Surgeon: Angelia Mould, MD;  Location: [facility] OR;  Service: Vascular;  Laterality: Right;  . Esophagogastroduodenoscopy (egd) with propofol N/A 12/12/2014    Procedure: ESOPHAGOGASTRODUODENOSCOPY (EGD) WITH PROPOFOL;  Surgeon: Lucilla Lame, MD;  Location: [facility] ENDOSCOPY;  Service: Endoscopy;  Laterality: N/A;  . Breast biopsy Left     Mastectomy  . Eye surgery Bilateral     Catract Extraction with IOL  . Joint replacement Bilateral     Total Hip Replacement  . Joint replacement Left     Total Knee Replacement   Family History  Problem Relation Age of Onset  . Arthritis Mother   . Cancer Mother   . Diabetes Mother   . Heart disease Mother   . Hyperlipidemia  Mother   . Hypertension Mother   . Alcohol abuse Father   . Alcohol abuse Brother   . Arthritis Brother   . Asthma Brother   . HIV Brother   . Cancer Brother   . Diabetes Brother   . Hyperlipidemia Brother   . Hypertension Brother   . Lung disease Brother   . Arthritis Maternal Grandmother   . Brain cancer Father    Social History  Substance Use Topics  . Smoking status: Former Smoker -- 1.50 packs/day for 44 years    Types: Cigarettes    Quit date: 01/06/1994  . Smokeless tobacco: Never Used     Comment: "quit smoking cigarettes in ~ 2000"  . Alcohol Use: No     Comment: 02/23/2014 "no alcohol since 1990's"   OB History    Gravida Para Term Preterm AB TAB SAB Ectopic Multiple Living   0 0 0 0 0 0 0 0       Review of Systems  Constitutional: Positive for fatigue. Negative for fever and chills.  HENT: Negative for congestion and rhinorrhea.   Eyes: Negative for visual disturbance.  Respiratory: Negative for cough, shortness of breath and wheezing.   Cardiovascular: Negative for chest pain and leg swelling.  Gastrointestinal: Negative for nausea, vomiting, abdominal pain and diarrhea.  Genitourinary: Positive for flank pain. Negative for dysuria.  Musculoskeletal: Negative for neck pain and neck stiffness.  Skin: Negative for rash.  Allergic/Immunologic: Negative for immunocompromised state.  Neurological: Negative for syncope, weakness and headaches.      Allergies  Vasotec; Enalapril; Codeine; and Morphine and related  Home Medications   Prior to Admission medications   Medication Sig Start Date End Date Taking? Authorizing Provider  acetylcysteine (MUCOMYST) 20 % nebulizer solution Take by nebulization as needed.  11/09/14 11/09/15 Yes Historical Provider, MD  albuterol (ACCUNEB) 0.63 MG/3ML nebulizer solution Take 1 ampule by nebulization every 6 (six) hours as needed for wheezing.   Yes Historical Provider, MD  albuterol (PROAIR HFA) 108 (90 BASE) MCG/ACT  inhaler Inhale 2 puffs into the lungs every 4 (four) hours as needed for wheezing or shortness of breath.    Yes Historical Provider, MD  aspirin EC 81 MG tablet Take 81 mg by mouth daily.   Yes Historical Provider, MD  atorvastatin (LIPITOR) 40 MG tablet TAKE ONE TABLET BY MOUTH AT BEDTIME. 04/11/15  Yes Arnetha Courser, MD  bumetanide (BUMEX) 1 MG tablet Take 1 mg by mouth daily. 08/07/14  Yes Historical Provider, MD  busPIRone (BUSPAR) 15 MG tablet Take 1 tablet (15 mg total) by mouth 2 (two) times daily. 01/22/15  Yes Arnetha Courser, MD  carvedilol (COREG)  12.5 MG tablet Take 12.5 mg by mouth 2 (two) times daily with a meal.  01/24/14  Yes Historical Provider, MD  doxycycline (VIBRA-TABS) 100 MG tablet Take 1 tablet (100 mg total) by mouth 2 (two) times daily. 05/04/15 05/11/15 Yes Arnetha Courser, MD  fluticasone (FLONASE) 50 MCG/ACT nasal spray Place 1 spray into both nostrils 2 (two) times daily as needed for allergies.  10/16/14  Yes Historical Provider, MD  gabapentin (NEURONTIN) 300 MG capsule Take 1 capsule (300 mg total) by mouth 2 (two) times daily. 05/04/15  Yes Arnetha Courser, MD  HUMALOG 100 UNIT/ML injection Inject 12 Units into the skin 3 (three) times daily with meals. PER SLIDING SCALE 06/13/14  Yes Historical Provider, MD  Hypromellose (ARTIFICIAL TEARS OP) Place 1 drop into both eyes 2 (two) times daily as needed (dry eyes).   Yes Historical Provider, MD  insulin glargine (LANTUS) 100 UNIT/ML injection Inject 0.3 mLs (30 Units total) into the skin daily. Patient taking differently: Inject 38 Units into the skin daily.  09/28/14  Yes Arnetha Courser, MD  levothyroxine (SYNTHROID, LEVOTHROID) 100 MCG tablet Take 1 tablet (100 mcg total) by mouth every other day. (alternate with the 112 mcg strength) 09/08/14  Yes Arnetha Courser, MD  levothyroxine (SYNTHROID, LEVOTHROID) 112 MCG tablet Take 1 tablet (112 mcg total) by mouth every other day. (alternate with the 100 mcg strength) 09/08/14  Yes Arnetha Courser, MD  montelukast (SINGULAIR) 10 MG tablet Take 1 tablet (10 mg total) by mouth daily. 12/07/14  Yes Arnetha Courser, MD  nitroGLYCERIN (NITROSTAT) 0.4 MG SL tablet Place 0.4 mg under the tongue every 5 (five) minutes as needed for chest pain.   Yes Historical Provider, MD  omeprazole (PRILOSEC) 20 MG capsule TAKE ONE CAPSULE BY MOUTH TWICE DAILY AS NEEDED (USE  MAY  INCREASE  RISK OF PNEUMONIA, OSTEOPOROSIS, AND ANEMIA) 03/23/15  Yes Arnetha Courser, MD  QUEtiapine (SEROQUEL) 25 MG tablet Take 0.5 tablets (12.5 mg total) by mouth at bedtime. 01/22/15  Yes Arnetha Courser, MD  rOPINIRole (REQUIP) 1 MG tablet Take 1 tablet (1 mg total) by mouth 3 (three) times daily. 12/07/14  Yes Arnetha Courser, MD  senna (SENOKOT) 8.6 MG TABS tablet Take 2 tablets by mouth at bedtime.    Yes Historical Provider, MD  sertraline (ZOLOFT) 100 MG tablet Take 1.5 tablets (150 mg total) by mouth daily. 01/22/15  Yes Arnetha Courser, MD  spironolactone (ALDACTONE) 25 MG tablet Take 25 mg by mouth daily. 08/27/14  Yes Historical Provider, MD  SYMBICORT 160-4.5 MCG/ACT inhaler INHALE TWO PUFFS BY MOUTH TWICE DAILY . RINSE MOUTH AFTER EACH USE 09/20/14  Yes Arnetha Courser, MD  tiotropium (SPIRIVA HANDIHALER) 18 MCG inhalation capsule Place 18 mcg into inhaler and inhale daily.   Yes Historical Provider, MD  tiZANidine (ZANAFLEX) 2 MG tablet Take 1 tablet (2 mg total) by mouth at bedtime. 04/13/15  Yes Donika Keith Rake, DO  acetaminophen (TYLENOL) 500 MG tablet Take 2 tablets (1,000 mg total) by mouth every 6 (six) hours as needed for moderate pain. 05/08/15   Duffy Bruce, MD  cephALEXin (KEFLEX) 500 MG capsule Take 1 capsule (500 mg total) by mouth 3 (three) times daily. 05/08/15   Duffy Bruce, MD  lactulose (CHRONULAC) 10 GM/15ML solution Take 15 mLs (10 g total) by mouth 2 (two) times daily as needed for mild constipation. 02/20/15   Arnetha Courser, MD  Nerve Stimulator (STANDARD TENS)  DEVI 1 Units by Does not apply route daily.  03/30/15   Arnetha Courser, MD   BP 155/70 mmHg  Pulse 62  Temp(Src) 98.3 F (36.8 C) (Oral)  Resp 12  SpO2 96% Physical Exam  Constitutional: She appears well-developed and well-nourished. No distress.  HENT:  Head: Normocephalic.  Mouth/Throat: No oropharyngeal exudate.  Eyes: Conjunctivae are normal. Pupils are equal, round, and reactive to light.  Neck: Normal range of motion. Neck supple.  Cardiovascular: Normal rate, regular rhythm, normal heart sounds and intact distal pulses.  Exam reveals no friction rub.   No murmur heard. Pulmonary/Chest: Breath sounds normal. No respiratory distress. She has no wheezes. She has no rales.  Abdominal: Soft. She exhibits no distension. There is no rebound and no guarding.    Musculoskeletal: She exhibits no edema.  Neurological: She is alert.  Skin: Skin is warm. No rash noted.  Nursing note and vitals reviewed.   ED Course  Procedures (including critical care time) Labs Review Labs Reviewed  COMPREHENSIVE METABOLIC PANEL - Abnormal; Notable for the following:    Potassium 3.4 (*)    Chloride 95 (*)    Glucose, Bld 188 (*)    Creatinine, Ser 1.25 (*)    GFR calc non Af Amer 42 (*)    GFR calc Af Amer 49 (*)    All other components within normal limits  CBC - Abnormal; Notable for the following:    RBC 3.80 (*)    Hemoglobin 9.0 (*)    HCT 29.7 (*)    MCH 23.7 (*)    RDW 17.3 (*)    All other components within normal limits  URINALYSIS, ROUTINE W REFLEX MICROSCOPIC (NOT AT [facility]) - Abnormal; Notable for the following:    Leukocytes, UA SMALL (*)    All other components within normal limits  URINE MICROSCOPIC-ADD ON - Abnormal; Notable for the following:    Squamous Epithelial / LPF 6-30 (*)    Bacteria, UA FEW (*)    Casts HYALINE CASTS (*)    All other components within normal limits  URINE CULTURE  LIPASE, BLOOD    Imaging Review Dg Chest 2 View  05/08/2015  CLINICAL DATA:  Recent diagnosis of urinary tract infection  now with left-sided flank pain. EXAM: CHEST  2 VIEW COMPARISON:  10/23/2014; 05/20/2014; chest CT - 05/21/2014 FINDINGS: Grossly unchanged enlarged cardiac silhouette and mediastinal contours with atherosclerotic plaque within the thoracic aorta right medial basilar heterogeneous opacities are unchanged and compatible with known prominent epicardial fat pad demonstrated on prior chest CT. No focal airspace opacities. No pleural effusion or pneumothorax. No evidence of edema. No acute osseus abnormalities. Stigmata of DISH within the thoracic spine. IMPRESSION: No acute cardiopulmonary disease. Electronically Signed   By: Sandi Mariscal M.D.   On: 05/08/2015 18:02   Ct Renal Stone Study  05/08/2015  CLINICAL DATA:  Left flank pain for 4 days. EXAM: CT ABDOMEN AND PELVIS WITHOUT CONTRAST TECHNIQUE: Multidetector CT imaging of the abdomen and pelvis was performed following the standard protocol without IV contrast. COMPARISON:  None. FINDINGS: Lower chest:  Lung bases are clear.  Normal heart size. Hepatobiliary: Normal liver.  Normal gallbladder. Pancreas: Normal. Spleen: Normal. Adrenals/Urinary Tract: Normal adrenal glands. No urolithiasis or obstructive uropathy. Limited evaluation at the UVJ and distal ureters secondary to beam hardening artifact resulting from bilateral total hip arthroplasties obscuring this region. Visualized bladder is unremarkable. Stomach/Bowel: No bowel wall thickening or dilatation. No pneumatosis, pneumoperitoneum or portal venous  gas. Scattered diverticular disease without diverticulitis. Small fat containing umbilical hernia. No free fluid. Vascular/Lymphatic: Normal caliber abdominal aorta with atherosclerosis. No lymphadenopathy. Reproductive: Prior hysterectomy.  No adnexal mass. Other: No fluid collection or hematoma. Musculoskeletal: No lytic or sclerotic osseous lesion. Bilateral total hip arthroplasties without failure or complication. Degenerative disc disease at L2-3 and L5-S1.  Bilateral facet arthropathy throughout the lumbar spine. Grade 1 anterolisthesis of L4 on L5 secondary to facet disease. IMPRESSION: 1. No urolithiasis or obstructive uropathy. Distal ureters and UVJ are obscured by beam hardening artifact resulting from bilateral total hip arthroplasties. 2. No acute abdominal or pelvic pathology. Electronically Signed   By: Kathreen Devoid   On: 05/08/2015 17:32   I have personally reviewed and evaluated these images and lab results as part of my medical decision-making.  MDM  75 year old female with past medical history of atrial fibrillation, diabetes, hyperlipidemia, history of recurrent UTIs, CHF, who presents with suprapubic pain radiating to her left flank. On arrival, VSS and WNL. Pt afebrile. Exam is as above. Etiology of flank pain unclear at this time - suspect ongoing UTI with bladder spasms, possibly early ascending infection though no fever, nausea, vomiting, or other sx of pyelo. Must also consider renal stone. No cough, sputum production, or sx to suggest basilar PNA or COPD. DDx also includes MSK flank pain as pt has h/o recent falls, pain is reproducible on exam, and is worse with flexion and rotation of abdomen. Will check broad screening labs, CT renal stone study as well as CXR. Pt denies any CP, palpitations, or signs of ACS.   CBC shows no leukocytosis. Anemia is at baseline. CMP with Cr 1.24, likely mild pre-renal azotemia, but no significant lyte abnormality. Pt has been given IVF. UA shows few bacteria, no overt pyuria however. Lipase normal. CXR clear. CT scan shows no urolithiasis, no uropathy, no other abdominal or pelvic pathology. No retroperitoneal pathology.   Discussed labs, findings with pt and daughter. VS remain stable, pt is overall very well-appearing and pain is resolved with analgesia. She is ambulatory without difficulty. Given normal labs, imaging, do not suspect acute emergent pathology. Given consideration of partially treated UTI  on doxycycline, will switch to Keflex and send UCx. Will d/c home with PCP f/u and return precautions.  Clinical Impression: 1. Flank pain, acute     Disposition: Discharge  Condition: Good  I have discussed the results, Dx and Tx plan with the pt(& family if present). He/she/they expressed understanding and agree(s) with the plan. Discharge instructions discussed at great length. Strict return precautions discussed and pt &/or family have verbalized understanding of the instructions. No further questions at time of discharge.    Discharge Medication List as of 05/08/2015  7:42 PM      Follow Up: Arnetha Courser, [address] [place] [address] [phone]   Follow-up in 3-5 days as needed   Pt seen in conjunction with Dr. Unice Bailey, MD 05/09/15 [place], MD 05/14/15 [phone]

## 2015-05-08 NOTE — Patient Instructions (Signed)
  Your procedure is scheduled on: May 16, 2015 (Wednesday) Report to Day Surgery.([facility]ICAL [facility])SECOND FLOOR To find out your arrival time please call [phone] between 1PM - 3PM on May 15, 2015 (Tuesday).  Remember: Instructions that are not followed completely may result in serious medical risk, up to and including death, or upon the discretion of your surgeon and anesthesiologist your surgery may need to be rescheduled.    __x__ 1. Do not eat food or drink liquids after midnight. No gum chewing or hard candies.     ____ 2. No Alcohol for 24 hours before or after surgery.   ____ 3. Bring all medications with you on the day of surgery if instructed.    ___x_ 4. Notify your doctor if there is any change in your medical condition     (cold, fever, infections).     Do not wear jewelry, make-up, hairpins, clips or nail polish.  Do not wear lotions, powders, or perfumes. You may wear deodorant.  Do not shave 48 hours prior to surgery. Men may shave face and neck.  Do not bring valuables to the hospital.    [facility] is not responsible for any belongings or valuables.               Contacts, dentures or bridgework may not be worn into surgery.  Leave your suitcase in the car. After surgery it may be brought to your room.  For patients admitted to the hospital, discharge time is determined by your                treatment team.   Patients discharged the day of surgery will not be allowed to drive home.   Please read over the following fact sheets that you were given:   MRSA Information and Surgical Site Infection Prevention   __x__ Take these medicines the morning of surgery with A SIP OF WATER:    1. Carvedilol  2. Omeprazole  3. Gabapentin  4.Montelukast  5.Buspirone  6.  ____ Fleet Enema (as directed)   __x__ Use CHG Soap as directed  __x__ Use inhalers on the day of surgery (Use Albuterol Nebulizer, Spiriva, and Symbicort inhaler the morning of surgery and bring  Albuterol, Symbicort and Spiriva inhalers to hospital the day of surgery  ____ Stop metformin 2 days prior to surgery    __x__ Take 1/2 of usual insulin dose the night before surgery and none on the morning of surgery. (NO INSULIN THE MORNING OF SURGERY)  ____ Stop Coumadin/Plavix/aspirin on (DO NO TAKE ASPIRIN THE MORNING OF SURGERY)  __x__ Stop Anti-inflammatories on (NO NSAIDS)   ____ Stop supplements until after surgery.    ____ Bring C-Pap to the hospital.

## 2015-05-08 NOTE — ED Notes (Signed)
Pt reports that she was diagnosed with a UTI on Friday and given antibiotics to treat it by her PCP. Pt reports now she is having pain in the suprapubic area radiating to her left flank. PT alert x4. NAD at this time.

## 2015-05-08 NOTE — ED Notes (Signed)
Patient transported to CT 

## 2015-05-09 NOTE — Pre-Procedure Instructions (Signed)
MRSA results called and faxed to Dr. Schnier office. 

## 2015-05-10 LAB — URINE CULTURE
Culture: NO GROWTH
SPECIAL REQUESTS: NORMAL

## 2015-05-11 ENCOUNTER — Other Ambulatory Visit: Payer: Self-pay | Admitting: Specialist

## 2015-05-11 DIAGNOSIS — J449 Chronic obstructive pulmonary disease, unspecified: Secondary | ICD-10-CM | POA: Diagnosis not present

## 2015-05-11 DIAGNOSIS — R911 Solitary pulmonary nodule: Secondary | ICD-10-CM | POA: Diagnosis not present

## 2015-05-11 DIAGNOSIS — K219 Gastro-esophageal reflux disease without esophagitis: Secondary | ICD-10-CM | POA: Diagnosis not present

## 2015-05-14 DIAGNOSIS — I10 Essential (primary) hypertension: Secondary | ICD-10-CM | POA: Diagnosis not present

## 2015-05-14 DIAGNOSIS — I6529 Occlusion and stenosis of unspecified carotid artery: Secondary | ICD-10-CM | POA: Diagnosis not present

## 2015-05-14 DIAGNOSIS — M1990 Unspecified osteoarthritis, unspecified site: Secondary | ICD-10-CM | POA: Diagnosis not present

## 2015-05-14 DIAGNOSIS — E785 Hyperlipidemia, unspecified: Secondary | ICD-10-CM | POA: Diagnosis not present

## 2015-05-14 DIAGNOSIS — R609 Edema, unspecified: Secondary | ICD-10-CM | POA: Diagnosis not present

## 2015-05-14 DIAGNOSIS — E119 Type 2 diabetes mellitus without complications: Secondary | ICD-10-CM | POA: Diagnosis not present

## 2015-05-14 DIAGNOSIS — I6523 Occlusion and stenosis of bilateral carotid arteries: Secondary | ICD-10-CM | POA: Diagnosis not present

## 2015-05-14 DIAGNOSIS — E669 Obesity, unspecified: Secondary | ICD-10-CM | POA: Diagnosis not present

## 2015-05-14 DIAGNOSIS — J45909 Unspecified asthma, uncomplicated: Secondary | ICD-10-CM | POA: Diagnosis not present

## 2015-05-16 ENCOUNTER — Inpatient Hospital Stay
Admission: RE | Admit: 2015-05-16 | Discharge: 2015-05-19 | DRG: 038 | Disposition: A | Discharge status: Home / Self Care | Payer: Medicare Other | Source: Ambulatory Visit | Attending: Vascular Surgery | Admitting: Vascular Surgery

## 2015-05-16 ENCOUNTER — Inpatient Hospital Stay: Payer: Medicare Other | Admitting: Certified Registered"

## 2015-05-16 ENCOUNTER — Encounter
Admission: RE | Disposition: A | Discharge status: Home / Self Care | Payer: Self-pay | Source: Ambulatory Visit | Attending: Vascular Surgery

## 2015-05-16 ENCOUNTER — Encounter: Payer: Self-pay | Admitting: *Deleted

## 2015-05-16 DIAGNOSIS — Z811 Family history of alcohol abuse and dependence: Secondary | ICD-10-CM | POA: Diagnosis not present

## 2015-05-16 DIAGNOSIS — Z825 Family history of asthma and other chronic lower respiratory diseases: Secondary | ICD-10-CM

## 2015-05-16 DIAGNOSIS — Z955 Presence of coronary angioplasty implant and graft: Secondary | ICD-10-CM | POA: Diagnosis not present

## 2015-05-16 DIAGNOSIS — Z888 Allergy status to other drugs, medicaments and biological substances status: Secondary | ICD-10-CM | POA: Diagnosis not present

## 2015-05-16 DIAGNOSIS — J45909 Unspecified asthma, uncomplicated: Secondary | ICD-10-CM | POA: Diagnosis present

## 2015-05-16 DIAGNOSIS — E890 Postprocedural hypothyroidism: Secondary | ICD-10-CM | POA: Diagnosis present

## 2015-05-16 DIAGNOSIS — Z452 Encounter for adjustment and management of vascular access device: Secondary | ICD-10-CM | POA: Diagnosis not present

## 2015-05-16 DIAGNOSIS — Z853 Personal history of malignant neoplasm of breast: Secondary | ICD-10-CM

## 2015-05-16 DIAGNOSIS — Z808 Family history of malignant neoplasm of other organs or systems: Secondary | ICD-10-CM

## 2015-05-16 DIAGNOSIS — Z794 Long term (current) use of insulin: Secondary | ICD-10-CM

## 2015-05-16 DIAGNOSIS — K219 Gastro-esophageal reflux disease without esophagitis: Secondary | ICD-10-CM | POA: Diagnosis present

## 2015-05-16 DIAGNOSIS — I252 Old myocardial infarction: Secondary | ICD-10-CM | POA: Diagnosis not present

## 2015-05-16 DIAGNOSIS — Z830 Family history of human immunodeficiency virus [HIV] disease: Secondary | ICD-10-CM | POA: Diagnosis not present

## 2015-05-16 DIAGNOSIS — E669 Obesity, unspecified: Secondary | ICD-10-CM | POA: Diagnosis not present

## 2015-05-16 DIAGNOSIS — Z8261 Family history of arthritis: Secondary | ICD-10-CM

## 2015-05-16 DIAGNOSIS — I6502 Occlusion and stenosis of left vertebral artery: Secondary | ICD-10-CM | POA: Diagnosis present

## 2015-05-16 DIAGNOSIS — Z8619 Personal history of other infectious and parasitic diseases: Secondary | ICD-10-CM

## 2015-05-16 DIAGNOSIS — E119 Type 2 diabetes mellitus without complications: Secondary | ICD-10-CM | POA: Diagnosis present

## 2015-05-16 DIAGNOSIS — R1312 Dysphagia, oropharyngeal phase: Secondary | ICD-10-CM | POA: Diagnosis not present

## 2015-05-16 DIAGNOSIS — G2581 Restless legs syndrome: Secondary | ICD-10-CM | POA: Diagnosis present

## 2015-05-16 DIAGNOSIS — Z833 Family history of diabetes mellitus: Secondary | ICD-10-CM

## 2015-05-16 DIAGNOSIS — J449 Chronic obstructive pulmonary disease, unspecified: Secondary | ICD-10-CM | POA: Diagnosis present

## 2015-05-16 DIAGNOSIS — Z86718 Personal history of other venous thrombosis and embolism: Secondary | ICD-10-CM | POA: Diagnosis not present

## 2015-05-16 DIAGNOSIS — J849 Interstitial pulmonary disease, unspecified: Secondary | ICD-10-CM

## 2015-05-16 DIAGNOSIS — D6489 Other specified anemias: Secondary | ICD-10-CM

## 2015-05-16 DIAGNOSIS — Z6841 Body Mass Index (BMI) 40.0 and over, adult: Secondary | ICD-10-CM

## 2015-05-16 DIAGNOSIS — G4730 Sleep apnea, unspecified: Secondary | ICD-10-CM | POA: Diagnosis present

## 2015-05-16 DIAGNOSIS — I509 Heart failure, unspecified: Secondary | ICD-10-CM | POA: Diagnosis present

## 2015-05-16 DIAGNOSIS — Z9841 Cataract extraction status, right eye: Secondary | ICD-10-CM | POA: Diagnosis not present

## 2015-05-16 DIAGNOSIS — I6521 Occlusion and stenosis of right carotid artery: Secondary | ICD-10-CM | POA: Diagnosis present

## 2015-05-16 DIAGNOSIS — Z9842 Cataract extraction status, left eye: Secondary | ICD-10-CM

## 2015-05-16 DIAGNOSIS — Z8249 Family history of ischemic heart disease and other diseases of the circulatory system: Secondary | ICD-10-CM

## 2015-05-16 DIAGNOSIS — Z7951 Long term (current) use of inhaled steroids: Secondary | ICD-10-CM

## 2015-05-16 DIAGNOSIS — R1310 Dysphagia, unspecified: Secondary | ICD-10-CM | POA: Diagnosis present

## 2015-05-16 DIAGNOSIS — S1093XD Contusion of unspecified part of neck, subsequent encounter: Secondary | ICD-10-CM

## 2015-05-16 DIAGNOSIS — Z7982 Long term (current) use of aspirin: Secondary | ICD-10-CM | POA: Diagnosis not present

## 2015-05-16 DIAGNOSIS — Z9981 Dependence on supplemental oxygen: Secondary | ICD-10-CM

## 2015-05-16 DIAGNOSIS — Z90710 Acquired absence of both cervix and uterus: Secondary | ICD-10-CM

## 2015-05-16 DIAGNOSIS — Z96643 Presence of artificial hip joint, bilateral: Secondary | ICD-10-CM | POA: Diagnosis present

## 2015-05-16 DIAGNOSIS — S1093XA Contusion of unspecified part of neck, initial encounter: Secondary | ICD-10-CM

## 2015-05-16 DIAGNOSIS — I110 Hypertensive heart disease with heart failure: Secondary | ICD-10-CM | POA: Diagnosis present

## 2015-05-16 DIAGNOSIS — Z79899 Other long term (current) drug therapy: Secondary | ICD-10-CM

## 2015-05-16 DIAGNOSIS — Z9012 Acquired absence of left breast and nipple: Secondary | ICD-10-CM

## 2015-05-16 DIAGNOSIS — Z961 Presence of intraocular lens: Secondary | ICD-10-CM | POA: Diagnosis present

## 2015-05-16 DIAGNOSIS — R221 Localized swelling, mass and lump, neck: Secondary | ICD-10-CM | POA: Diagnosis not present

## 2015-05-16 DIAGNOSIS — Z9851 Tubal ligation status: Secondary | ICD-10-CM | POA: Diagnosis not present

## 2015-05-16 DIAGNOSIS — E785 Hyperlipidemia, unspecified: Secondary | ICD-10-CM | POA: Diagnosis not present

## 2015-05-16 DIAGNOSIS — I2510 Atherosclerotic heart disease of native coronary artery without angina pectoris: Secondary | ICD-10-CM | POA: Diagnosis not present

## 2015-05-16 DIAGNOSIS — Z9889 Other specified postprocedural states: Secondary | ICD-10-CM

## 2015-05-16 DIAGNOSIS — I10 Essential (primary) hypertension: Secondary | ICD-10-CM | POA: Diagnosis not present

## 2015-05-16 DIAGNOSIS — R609 Edema, unspecified: Secondary | ICD-10-CM | POA: Diagnosis not present

## 2015-05-16 DIAGNOSIS — I6523 Occlusion and stenosis of bilateral carotid arteries: Secondary | ICD-10-CM | POA: Diagnosis not present

## 2015-05-16 DIAGNOSIS — D649 Anemia, unspecified: Secondary | ICD-10-CM | POA: Diagnosis not present

## 2015-05-16 DIAGNOSIS — M1990 Unspecified osteoarthritis, unspecified site: Secondary | ICD-10-CM | POA: Diagnosis present

## 2015-05-16 DIAGNOSIS — Z96652 Presence of left artificial knee joint: Secondary | ICD-10-CM | POA: Diagnosis present

## 2015-05-16 DIAGNOSIS — I739 Peripheral vascular disease, unspecified: Secondary | ICD-10-CM | POA: Diagnosis present

## 2015-05-16 HISTORY — PX: ENDARTERECTOMY: SHX5162

## 2015-05-16 LAB — POCT I-STAT 4, (NA,K, GLUC, HGB,HCT)
GLUCOSE: 171 mg/dL — AB (ref 65–99)
HCT: 30 % — ABNORMAL LOW (ref 36.0–46.0)
Hemoglobin: 10.2 g/dL — ABNORMAL LOW (ref 12.0–15.0)
POTASSIUM: 3.5 mmol/L (ref 3.5–5.1)
Sodium: 140 mmol/L (ref 135–145)

## 2015-05-16 LAB — GLUCOSE, CAPILLARY
GLUCOSE-CAPILLARY: 164 mg/dL — AB (ref 65–99)
Glucose-Capillary: 138 mg/dL — ABNORMAL HIGH (ref 65–99)

## 2015-05-16 SURGERY — ENDARTERECTOMY, CAROTID
Anesthesia: General | Laterality: Right | Wound class: Clean

## 2015-05-16 MED ORDER — LIDOCAINE HCL (PF) 1 % IJ SOLN
INTRAMUSCULAR | Status: AC
  Filled 2015-05-16: qty 30

## 2015-05-16 MED ORDER — NITROGLYCERIN 0.4 MG SL SUBL
0.4000 mg | SUBLINGUAL_TABLET | SUBLINGUAL | Status: DC | PRN
Start: 2015-05-16 — End: 2015-05-19

## 2015-05-16 MED ORDER — ALBUTEROL SULFATE (2.5 MG/3ML) 0.083% IN NEBU: 2.5000 mg | INHALATION_SOLUTION | RESPIRATORY_TRACT | Status: DC | PRN

## 2015-05-16 MED ORDER — STANDARD TENS DEVI: 1.0000 [IU] | Freq: Every day | Status: DC

## 2015-05-16 MED ORDER — ACETYLCYSTEINE 20 % IN SOLN
3.0000 mL | RESPIRATORY_TRACT | Status: DC | PRN
  Filled 2015-05-16: qty 4

## 2015-05-16 MED ORDER — ONDANSETRON HCL 4 MG/2ML IJ SOLN
INTRAMUSCULAR | Status: DC | PRN
  Administered 2015-05-16: 4 mg via INTRAVENOUS

## 2015-05-16 MED ORDER — ALBUTEROL SULFATE 0.63 MG/3ML IN NEBU: 1.0000 | INHALATION_SOLUTION | Freq: Four times a day (QID) | RESPIRATORY_TRACT | Status: DC | PRN

## 2015-05-16 MED ORDER — SODIUM CHLORIDE FLUSH 0.9 % IV SOLN
INTRAVENOUS | Status: AC
  Filled 2015-05-16: qty 20

## 2015-05-16 MED ORDER — PHENYLEPHRINE HCL 10 MG/ML IJ SOLN
INTRAMUSCULAR | Status: DC | PRN
Start: 2015-05-16 — End: 2015-05-16
  Administered 2015-05-16: 100 ug via INTRAVENOUS
  Administered 2015-05-16: 200 ug via INTRAVENOUS
  Administered 2015-05-16: 300 ug via INTRAVENOUS
  Administered 2015-05-16 (×6): 100 ug via INTRAVENOUS

## 2015-05-16 MED ORDER — PROPOFOL 10 MG/ML IV BOLUS
INTRAVENOUS | Status: DC | PRN
  Administered 2015-05-16: 80 mg via INTRAVENOUS
  Administered 2015-05-16: 120 mg via INTRAVENOUS

## 2015-05-16 MED ORDER — SPIRONOLACTONE 25 MG PO TABS
25.0000 mg | ORAL_TABLET | Freq: Every day | ORAL | Status: DC
  Administered 2015-05-18 – 2015-05-19 (×2): 25 mg via ORAL
  Filled 2015-05-16 (×4): qty 1

## 2015-05-16 MED ORDER — MOMETASONE FURO-FORMOTEROL FUM 200-5 MCG/ACT IN AERO
2.0000 | INHALATION_SPRAY | Freq: Two times a day (BID) | RESPIRATORY_TRACT | Status: DC
  Administered 2015-05-16 – 2015-05-19 (×6): 2 via RESPIRATORY_TRACT
  Filled 2015-05-16 (×2): qty 8.8

## 2015-05-16 MED ORDER — NITROGLYCERIN 0.2 MG/ML ON CALL CATH LAB
INTRAVENOUS | Status: DC | PRN
  Administered 2015-05-16 (×3): 40 ug via INTRAVENOUS

## 2015-05-16 MED ORDER — HEPARIN SODIUM (PORCINE) 5000 UNIT/ML IJ SOLN
INTRAMUSCULAR | Status: AC
  Filled 2015-05-16: qty 1

## 2015-05-16 MED ORDER — ALUM & MAG HYDROXIDE-SIMETH 200-200-20 MG/5ML PO SUSP: 15.0000 mL | ORAL | Status: DC | PRN

## 2015-05-16 MED ORDER — FENTANYL CITRATE (PF) 100 MCG/2ML IJ SOLN: 25.0000 ug | INTRAMUSCULAR | Status: DC | PRN

## 2015-05-16 MED ORDER — ONDANSETRON HCL 4 MG/2ML IJ SOLN: 4.0000 mg | Freq: Once | INTRAMUSCULAR | Status: DC | PRN

## 2015-05-16 MED ORDER — ACETAMINOPHEN 10 MG/ML IV SOLN
INTRAVENOUS | Status: AC
Start: 2015-05-16 — End: 2015-05-16
  Filled 2015-05-16: qty 100

## 2015-05-16 MED ORDER — CEFAZOLIN SODIUM-DEXTROSE 2-4 GM/100ML-% IV SOLN: 2.0000 g | INTRAVENOUS | Status: DC

## 2015-05-16 MED ORDER — TIOTROPIUM BROMIDE MONOHYDRATE 18 MCG IN CAPS
18.0000 ug | ORAL_CAPSULE | Freq: Every day | RESPIRATORY_TRACT | Status: DC
  Administered 2015-05-17 – 2015-05-19 (×3): 18 ug via RESPIRATORY_TRACT
  Filled 2015-05-16: qty 5

## 2015-05-16 MED ORDER — LIDOCAINE HCL (CARDIAC) 20 MG/ML IV SOLN
INTRAVENOUS | Status: DC | PRN
Start: 2015-05-16 — End: 2015-05-16
  Administered 2015-05-16: 100 mg via INTRAVENOUS

## 2015-05-16 MED ORDER — ACETAMINOPHEN 500 MG PO TABS: 1000.0000 mg | ORAL_TABLET | Freq: Four times a day (QID) | ORAL | Status: DC | PRN

## 2015-05-16 MED ORDER — PANTOPRAZOLE SODIUM 40 MG PO TBEC
40.0000 mg | DELAYED_RELEASE_TABLET | Freq: Every day | ORAL | Status: DC
  Administered 2015-05-18 – 2015-05-19 (×2): 40 mg via ORAL
  Filled 2015-05-16 (×4): qty 1

## 2015-05-16 MED ORDER — QUETIAPINE FUMARATE 25 MG PO TABS
12.5000 mg | ORAL_TABLET | Freq: Every day | ORAL | Status: DC
  Administered 2015-05-17 – 2015-05-18 (×2): 12.5 mg via ORAL
  Filled 2015-05-16 (×2): qty 1

## 2015-05-16 MED ORDER — LACTULOSE 10 GM/15ML PO SOLN: 10.0000 g | Freq: Two times a day (BID) | ORAL | Status: DC | PRN

## 2015-05-16 MED ORDER — MONTELUKAST SODIUM 10 MG PO TABS
10.0000 mg | ORAL_TABLET | Freq: Every day | ORAL | Status: DC
  Administered 2015-05-18 – 2015-05-19 (×2): 10 mg via ORAL
  Filled 2015-05-16 (×3): qty 1

## 2015-05-16 MED ORDER — HEPARIN SODIUM (PORCINE) 1000 UNIT/ML IJ SOLN
INTRAMUSCULAR | Status: DC | PRN
  Administered 2015-05-16: 7000 [IU] via INTRAVENOUS

## 2015-05-16 MED ORDER — ARTIFICIAL TEARS OP OINT: TOPICAL_OINTMENT | Freq: Two times a day (BID) | OPHTHALMIC | Status: DC | PRN

## 2015-05-16 MED ORDER — ASPIRIN EC 81 MG PO TBEC: 81.0000 mg | DELAYED_RELEASE_TABLET | Freq: Every day | ORAL | Status: DC

## 2015-05-16 MED ORDER — SENNA 8.6 MG PO TABS
2.0000 | ORAL_TABLET | Freq: Every day | ORAL | Status: DC
  Administered 2015-05-17 – 2015-05-18 (×2): 17.2 mg via ORAL
  Filled 2015-05-16 (×3): qty 2

## 2015-05-16 MED ORDER — ROCURONIUM BROMIDE 100 MG/10ML IV SOLN
INTRAVENOUS | Status: DC | PRN
  Administered 2015-05-16: 10 mg via INTRAVENOUS
  Administered 2015-05-16: 40 mg via INTRAVENOUS
  Administered 2015-05-16: 5 mg via INTRAVENOUS

## 2015-05-16 MED ORDER — HYDROMORPHONE HCL 1 MG/ML IJ SOLN
0.5000 mg | INTRAMUSCULAR | Status: DC | PRN
  Administered 2015-05-16 (×2): 0.5 mg via INTRAVENOUS
  Administered 2015-05-16: 1 mg via INTRAVENOUS
  Filled 2015-05-16 (×4): qty 1

## 2015-05-16 MED ORDER — POLYETHYLENE GLYCOL 3350 17 G PO PACK
17.0000 g | PACK | Freq: Every day | ORAL | Status: DC | PRN
Start: 2015-05-16 — End: 2015-05-19

## 2015-05-16 MED ORDER — FENTANYL CITRATE (PF) 100 MCG/2ML IJ SOLN
INTRAMUSCULAR | Status: DC | PRN
  Administered 2015-05-16: 50 ug via INTRAVENOUS
  Administered 2015-05-16: 150 ug via INTRAVENOUS
  Administered 2015-05-16: 50 ug via INTRAVENOUS

## 2015-05-16 MED ORDER — NITROGLYCERIN IN D5W 200-5 MCG/ML-% IV SOLN
INTRAVENOUS | Status: AC
  Filled 2015-05-16: qty 250

## 2015-05-16 MED ORDER — CHLORHEXIDINE GLUCONATE 0.12 % MT SOLN
15.0000 mL | Freq: Two times a day (BID) | OROMUCOSAL | Status: DC
  Administered 2015-05-16 – 2015-05-19 (×5): 15 mL via OROMUCOSAL
  Filled 2015-05-16 (×5): qty 15

## 2015-05-16 MED ORDER — ACETAMINOPHEN 325 MG RE SUPP
325.0000 mg | RECTAL | Status: DC | PRN
  Filled 2015-05-16: qty 2

## 2015-05-16 MED ORDER — ACETAMINOPHEN 325 MG PO TABS
325.0000 mg | ORAL_TABLET | ORAL | Status: DC | PRN
  Filled 2015-05-16: qty 2

## 2015-05-16 MED ORDER — BUMETANIDE 1 MG PO TABS
1.0000 mg | ORAL_TABLET | Freq: Every day | ORAL | Status: DC
  Administered 2015-05-18 – 2015-05-19 (×2): 1 mg via ORAL
  Filled 2015-05-16 (×3): qty 1

## 2015-05-16 MED ORDER — ALBUTEROL SULFATE HFA 108 (90 BASE) MCG/ACT IN AERS: 2.0000 | INHALATION_SPRAY | RESPIRATORY_TRACT | Status: DC | PRN

## 2015-05-16 MED ORDER — OXYCODONE HCL 5 MG PO TABS
5.0000 mg | ORAL_TABLET | ORAL | Status: DC | PRN
  Administered 2015-05-18 (×2): 5 mg via ORAL
  Filled 2015-05-16 (×2): qty 1

## 2015-05-16 MED ORDER — DOCUSATE SODIUM 100 MG PO CAPS
100.0000 mg | ORAL_CAPSULE | Freq: Every day | ORAL | Status: DC
Start: 2015-05-17 — End: 2015-05-19
  Administered 2015-05-19: 100 mg via ORAL
  Filled 2015-05-16: qty 1

## 2015-05-16 MED ORDER — ROPINIROLE HCL 1 MG PO TABS
1.0000 mg | ORAL_TABLET | Freq: Three times a day (TID) | ORAL | Status: DC
  Administered 2015-05-16 – 2015-05-19 (×7): 1 mg via ORAL
  Filled 2015-05-16 (×9): qty 1

## 2015-05-16 MED ORDER — GLYCOPYRROLATE 0.2 MG/ML IJ SOLN
INTRAMUSCULAR | Status: DC | PRN
  Administered 2015-05-16: 0.6 mg via INTRAVENOUS

## 2015-05-16 MED ORDER — CARVEDILOL 12.5 MG PO TABS
12.5000 mg | ORAL_TABLET | Freq: Two times a day (BID) | ORAL | Status: DC
  Administered 2015-05-16 – 2015-05-19 (×5): 12.5 mg via ORAL
  Filled 2015-05-16 (×9): qty 1

## 2015-05-16 MED ORDER — CEFAZOLIN SODIUM 1-5 GM-% IV SOLN
1.0000 g | Freq: Three times a day (TID) | INTRAVENOUS | Status: AC
  Administered 2015-05-16 – 2015-05-17 (×3): 1 g via INTRAVENOUS
  Filled 2015-05-16 (×3): qty 50

## 2015-05-16 MED ORDER — HYDRALAZINE HCL 20 MG/ML IJ SOLN: 5.0000 mg | INTRAMUSCULAR | Status: DC | PRN

## 2015-05-16 MED ORDER — CEFAZOLIN SODIUM-DEXTROSE 2-4 GM/100ML-% IV SOLN
INTRAVENOUS | Status: AC
  Administered 2015-05-16: 2 g via INTRAVENOUS
  Filled 2015-05-16: qty 100

## 2015-05-16 MED ORDER — SODIUM CHLORIDE 0.9 % IV SOLN
10000.0000 ug | INTRAVENOUS | Status: DC | PRN
  Administered 2015-05-16: 20 ug/min via INTRAVENOUS

## 2015-05-16 MED ORDER — FLUTICASONE PROPIONATE 50 MCG/ACT NA SUSP: 1.0000 | Freq: Two times a day (BID) | NASAL | Status: DC | PRN

## 2015-05-16 MED ORDER — POTASSIUM CHLORIDE IN NACL 20-0.9 MEQ/L-% IV SOLN
INTRAVENOUS | Status: DC
  Administered 2015-05-16 – 2015-05-19 (×4): via INTRAVENOUS
  Filled 2015-05-16 (×8): qty 1000

## 2015-05-16 MED ORDER — TIZANIDINE HCL 4 MG PO TABS
2.0000 mg | ORAL_TABLET | Freq: Every day | ORAL | Status: DC
  Administered 2015-05-17 – 2015-05-18 (×2): 2 mg via ORAL
  Filled 2015-05-16 (×4): qty 1

## 2015-05-16 MED ORDER — METOPROLOL TARTRATE 5 MG/5ML IV SOLN
5.0000 mg | Freq: Four times a day (QID) | INTRAVENOUS | Status: DC
  Administered 2015-05-16 – 2015-05-19 (×5): 5 mg via INTRAVENOUS
  Filled 2015-05-16 (×9): qty 5

## 2015-05-16 MED ORDER — NEOSTIGMINE METHYLSULFATE 10 MG/10ML IV SOLN
INTRAVENOUS | Status: DC | PRN
  Administered 2015-05-16: 4 mg via INTRAVENOUS

## 2015-05-16 MED ORDER — ESMOLOL HCL 100 MG/10ML IV SOLN
INTRAVENOUS | Status: DC | PRN
  Administered 2015-05-16: 30 mg via INTRAVENOUS

## 2015-05-16 MED ORDER — LABETALOL HCL 5 MG/ML IV SOLN
10.0000 mg | INTRAVENOUS | Status: DC | PRN
  Filled 2015-05-16: qty 4

## 2015-05-16 MED ORDER — MIDAZOLAM HCL 2 MG/2ML IJ SOLN
INTRAMUSCULAR | Status: DC | PRN
  Administered 2015-05-16: 2 mg via INTRAVENOUS

## 2015-05-16 MED ORDER — SODIUM CHLORIDE 0.9 % IV SOLN: INTRAVENOUS | Status: DC

## 2015-05-16 MED ORDER — ONDANSETRON HCL 4 MG/2ML IJ SOLN: 4.0000 mg | Freq: Four times a day (QID) | INTRAMUSCULAR | Status: DC | PRN

## 2015-05-16 MED ORDER — CETYLPYRIDINIUM CHLORIDE 0.05 % MT LIQD
7.0000 mL | Freq: Two times a day (BID) | OROMUCOSAL | Status: DC
  Administered 2015-05-16 – 2015-05-19 (×4): 7 mL via OROMUCOSAL

## 2015-05-16 MED ORDER — SUCCINYLCHOLINE CHLORIDE 20 MG/ML IJ SOLN
INTRAMUSCULAR | Status: DC | PRN
  Administered 2015-05-16: 100 mg via INTRAVENOUS

## 2015-05-16 MED ORDER — SODIUM CHLORIDE 0.9 % IV SOLN
INTRAVENOUS | Status: DC | PRN
  Administered 2015-05-16: 100 mL via INTRAMUSCULAR

## 2015-05-16 MED ORDER — ATORVASTATIN CALCIUM 20 MG PO TABS
40.0000 mg | ORAL_TABLET | Freq: Every day | ORAL | Status: DC
  Administered 2015-05-17 – 2015-05-18 (×2): 40 mg via ORAL
  Filled 2015-05-16 (×3): qty 2

## 2015-05-16 MED ORDER — SERTRALINE HCL 50 MG PO TABS
150.0000 mg | ORAL_TABLET | Freq: Every day | ORAL | Status: DC
  Administered 2015-05-17 – 2015-05-19 (×3): 150 mg via ORAL
  Filled 2015-05-16 (×4): qty 1

## 2015-05-16 MED ORDER — SODIUM CHLORIDE 0.9 % IV SOLN
INTRAVENOUS | Status: DC | PRN
  Administered 2015-05-16 (×2): via INTRAVENOUS

## 2015-05-16 MED ORDER — LEVOTHYROXINE SODIUM 100 MCG PO TABS
100.0000 ug | ORAL_TABLET | ORAL | Status: DC
  Administered 2015-05-18: 100 ug via ORAL
  Filled 2015-05-16: qty 1

## 2015-05-16 MED ORDER — LIDOCAINE HCL (PF) 4 % IJ SOLN
INTRAMUSCULAR | Status: DC | PRN
  Administered 2015-05-16: 4 mL via INTRADERMAL

## 2015-05-16 MED ORDER — BUSPIRONE HCL 5 MG PO TABS
15.0000 mg | ORAL_TABLET | Freq: Two times a day (BID) | ORAL | Status: DC
  Administered 2015-05-16 – 2015-05-19 (×5): 15 mg via ORAL
  Filled 2015-05-16 (×7): qty 3

## 2015-05-16 MED ORDER — LEVOTHYROXINE SODIUM 112 MCG PO TABS
112.0000 ug | ORAL_TABLET | ORAL | Status: DC
  Administered 2015-05-19: 112 ug via ORAL
  Filled 2015-05-16 (×2): qty 1

## 2015-05-16 MED ORDER — GABAPENTIN 300 MG PO CAPS
300.0000 mg | ORAL_CAPSULE | Freq: Two times a day (BID) | ORAL | Status: DC
  Administered 2015-05-17 – 2015-05-19 (×4): 300 mg via ORAL
  Filled 2015-05-16 (×5): qty 1

## 2015-05-16 SURGICAL SUPPLY — 66 items
APPLIER CLIP 11 MED OPEN (CLIP)
APPLIER CLIP 9.375 SM OPEN (CLIP) ×3
BAG DECANTER FOR FLEXI CONT (MISCELLANEOUS) ×3 IMPLANT
BLADE CLIPPER SURG (BLADE) ×3 IMPLANT
BLADE SURG 15 STRL LF DISP TIS (BLADE) ×1 IMPLANT
BLADE SURG 15 STRL SS (BLADE) ×2
BLADE SURG SZ11 CARB STEEL (BLADE) ×3 IMPLANT
BOOT SUTURE AID YELLOW STND (SUTURE) ×6 IMPLANT
BRUSH SCRUB 4% CHG (MISCELLANEOUS) ×3 IMPLANT
CANISTER SUCT 1200ML W/VALVE (MISCELLANEOUS) ×3 IMPLANT
CATH TRAY 16F METER LATEX (MISCELLANEOUS) ×3 IMPLANT
CLIP APPLIE 11 MED OPEN (CLIP) IMPLANT
CLIP APPLIE 9.375 SM OPEN (CLIP) ×1 IMPLANT
DRAPE INCISE IOBAN 66X45 STRL (DRAPES) ×3 IMPLANT
DRAPE LAPAROTOMY 100X77 ABD (DRAPES) ×3 IMPLANT
DRAPE SHEET LG 3/4 BI-LAMINATE (DRAPES) ×3 IMPLANT
DRESSING SURGICEL FIBRLLR 1X2 (HEMOSTASIS) ×1 IMPLANT
DRSG SURGICEL FIBRILLAR 1X2 (HEMOSTASIS) ×3
DURAPREP 26ML APPLICATOR (WOUND CARE) ×3 IMPLANT
ELECT CAUTERY BLADE 6.4 (BLADE) ×3 IMPLANT
ELECT REM PT RETURN 9FT ADLT (ELECTROSURGICAL) ×3
ELECTRODE REM PT RTRN 9FT ADLT (ELECTROSURGICAL) ×1 IMPLANT
GLOVE BIO SURGEON STRL SZ7 (GLOVE) ×6 IMPLANT
GLOVE INDICATOR 7.5 STRL GRN (GLOVE) ×3 IMPLANT
GLOVE SURG SYN 8.0 (GLOVE) ×6 IMPLANT
GOWN STRL REUS W/ TWL LRG LVL3 (GOWN DISPOSABLE) ×2 IMPLANT
GOWN STRL REUS W/ TWL XL LVL3 (GOWN DISPOSABLE) ×1 IMPLANT
GOWN STRL REUS W/TWL LRG LVL3 (GOWN DISPOSABLE) ×4
GOWN STRL REUS W/TWL XL LVL3 (GOWN DISPOSABLE) ×2
HOLDER FOLEY CATH W/STRAP (MISCELLANEOUS) ×3 IMPLANT
IV NS 500ML (IV SOLUTION) ×2
IV NS 500ML BAXH (IV SOLUTION) ×1 IMPLANT
KIT RM TURNOVER STRD PROC AR (KITS) ×3 IMPLANT
LABEL OR SOLS (LABEL) ×3 IMPLANT
LIQUID BAND (GAUZE/BANDAGES/DRESSINGS) ×3 IMPLANT
LOOP RED MAXI  1X406MM (MISCELLANEOUS) ×4
LOOP VESSEL MAXI 1X406 RED (MISCELLANEOUS) ×2 IMPLANT
LOOP VESSEL MINI 0.8X406 BLUE (MISCELLANEOUS) ×1 IMPLANT
LOOPS BLUE MINI 0.8X406MM (MISCELLANEOUS) ×2
MICROPUNCTURE 5FR NT-U-SST (MISCELLANEOUS) ×3
NEEDLE FILTER BLUNT 18X 1/2SAF (NEEDLE) ×2
NEEDLE FILTER BLUNT 18X1 1/2 (NEEDLE) ×1 IMPLANT
NEEDLE HYPO 25X1 1.5 SAFETY (NEEDLE) ×3 IMPLANT
NS IRRIG 500ML POUR BTL (IV SOLUTION) ×3 IMPLANT
PACK BASIN MAJOR ARMC (MISCELLANEOUS) ×3 IMPLANT
PATCH CAROTID ECM VASC 1X10 (Prosthesis & Implant Heart) ×3 IMPLANT
PENCIL ELECTRO HAND CTR (MISCELLANEOUS) IMPLANT
SET MICROPUNCTURE 5FR NT-U-SST (MISCELLANEOUS) ×1 IMPLANT
SHUNT CAROTID STR REINF 3.0X4. (MISCELLANEOUS) ×3 IMPLANT
SUT MNCRL+ 5-0 UNDYED PC-3 (SUTURE) ×1 IMPLANT
SUT MONOCRYL 5-0 (SUTURE) ×2
SUT PROLENE 6 0 BV (SUTURE) ×42 IMPLANT
SUT PROLENE 7 0 BV 1 (SUTURE) ×18 IMPLANT
SUT SILK 2 0 (SUTURE) ×2
SUT SILK 2-0 18XBRD TIE 12 (SUTURE) ×1 IMPLANT
SUT SILK 3 0 (SUTURE) ×2
SUT SILK 3-0 18XBRD TIE 12 (SUTURE) ×1 IMPLANT
SUT SILK 4 0 (SUTURE) ×2
SUT SILK 4-0 18XBRD TIE 12 (SUTURE) ×1 IMPLANT
SUT VIC AB 3-0 SH 27 (SUTURE) ×4
SUT VIC AB 3-0 SH 27X BRD (SUTURE) ×2 IMPLANT
SYR 20CC LL (SYRINGE) ×3 IMPLANT
SYR 3ML LL SCALE MARK (SYRINGE) ×3 IMPLANT
SYRINGE 10CC LL (SYRINGE) IMPLANT
TUBING CONNECTING 10 (TUBING) IMPLANT
TUBING CONNECTING 10' (TUBING)

## 2015-05-16 NOTE — Anesthesia Procedure Notes (Signed)
Procedure Name: Intubation Performed by: Lance Muss Pre-anesthesia Checklist: Patient identified, Emergency Drugs available, Suction available, Patient being monitored and Timeout performed Patient Re-evaluated:Patient Re-evaluated prior to inductionOxygen Delivery Method: Circle system utilized Preoxygenation: Pre-oxygenation with 100% oxygen Intubation Type: IV induction Ventilation: Mask ventilation without difficulty Laryngoscope Size: Mac and 3 Grade View: Grade I Tube type: Oral Tube size: 7.0 mm Number of attempts: 1 Airway Equipment and Method: Stylet and LTA kit utilized Placement Confirmation: ETT inserted through vocal cords under direct vision,  positive ETCO2 and breath sounds checked- equal and bilateral Secured at: 20 cm Tube secured with: Tape Dental Injury: Teeth and Oropharynx as per pre-operative assessment

## 2015-05-16 NOTE — Op Note (Signed)
[facility]   OPERATIVE NOTE  PROCEDURE:   1.  Right carotid endarterectomy with CorMatrix arterial patch reconstruction  PRE-OPERATIVE DIAGNOSIS: 1.  Critical carotid stenosis 2. 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 Coronary artery disease 4. Diabetes mellitus 5. Hypertension  POST-OPERATIVE DIAGNOSIS: same as above   SURGEON: Katha Cabal, MD  ASSISTANT(S): None  ANESTHESIA: general  ESTIMATED BLOOD LOSS: 50 cc  FINDING(S): 1.  Extensive calcified carotid plaque.  SPECIMEN(S):  Carotid plaque (sent to Pathology)  INDICATIONS:   Bonnie Henry is a 75 y.o. y.o. female who presents with right carotid stenosis of 90 %.  The risks, benefits, and alternatives to carotid endarterectomy were discussed with the patient. The differences between carotid stenting and carotid endarterectomy were reviewed.  The patient voiced understanding and appears to be aware that the risks of carotid endarterectomy include but are not limited to: bleeding, infection, stroke, myocardial infarction, death, cranial nerve injuries both temporary and permanent, neck hematoma, possible airway compromise, labile blood pressure post-operatively, cerebral hyperperfusion syndrome, and possible need for additional interventions in the future. The patient is aware of the risks and agrees to proceed forward with the procedure.  DESCRIPTION: After full informed written consent was obtained from the patient, the patient was brought back to the operating room and placed supine upon the operating table.  Prior to induction, the patient received IV antibiotics.  After obtaining adequate anesthesia, the patient was placed a supine position with a shoulder roll in place and the patient's neck slightly hyperextended and rotated away from the surgical site.  The patient was prepped in the standard fashion for a carotid endarterectomy.    The incision was made anterior to the sternocleidomastoid  muscle and dissected down through the subcutaneous tissue.  The platysmas was opened with electrocautery.  The internal jugular vein and facial vein were identified.  The facial vein is ligated and divided between 2-0 silk ties.  The omohyoid was identified in the common carotid artery exposed at this level. The dissection was there in carried out along the carotid artery in a cranial direction.  The dissection was then carried along periadventitial plane along the common carotid artery up to the bifurcation. The external carotid artery was identified. Vessel loops were then placed around the external carotid artery as well as the superior thyroid artery. In the process of this dissection, the hypoglossal nerve was identified and protected from harm.  The internal carotid artery was then dissected circumferentially just beyond an area in the internal carotid artery distal to the plaque.    At this point, we gave the patient 7000 units of intravenous heparin.  After this was allowed to circulate for several minutes, the common carotid followed by the external carotid and then the internal carotid artery were clamped.  Arteriotomy was made in the common carotid artery with a 11 blade, and extended the arteriotomy with a Potts scissor down into the common carotid artery, then the arteriotomy was carried through the bifurcation into the internal carotid artery until I reached an area that was not diseased.  At this point, a Sundt shunt was placed.  The endarterectomy was begun in the common carotid artery with a Garment/textile technologist and carried this dissection down into the common carotid artery circumferentially.  Then I transected the plaque at a segment where it was adherent and transected the plaque with Potts scissors.  I then carried this dissection up into the external carotid artery.  The plaque was  extracted by unclamping the external carotid artery and performing an eversion endarterectomy.  The dissection  was then carried into the internal carotid artery where a  feathered end point was created.  The plaque was passed off the field as a specimen.  The distal endpoint, that is the internal carotid artery was then shortened by approximately 10 mm. This was accomplished by tacking down arterial plication with 6 interrupted 6-0 Prolene sutures.  A CorMatrix arterial patch was delivered onto the field and trimmed appropriately for the artery and sewed in place with 6-0 Prolene using a 4 quadrant technique.  The medial suture line was completed and the lateral suture line was run approximately one quarter the length of the arteriotomy.  Prior to completing this patch angioplasty, the shunt was removed, the internal carotid artery was flushed and there was excellent backbleeding.  The carotid artery repair was flushed with heparinized saline and then the patch angioplasty was completed in the usual fashion.  The flow was then reestablished first to the external carotid artery and then the internal carotid artery to prevent distal embolization.   Several minutes of pressure were held and 6-0 Prolene patch sutures were used as needed for hemostasis.  At this point, I placed Surgicel and Evicel topical hemostatic agents.  There was no more active bleeding in the surgical site.  The sternocleidomastoid space was closed with three interrupted 3-0 Vicryl sutures. I then reapproximated the platysma muscle with a running stitch of 3-0 Vicryl.  The skin was then closed with a running subcuticular 4-0 Monocryl.  The skin was then cleaned, dried and Dermabond was used to reinforce the skin closure.  The patient awakened and was taken to the recovery room in stable condition, following commands and moving all four extremities without any apparent deficits.    COMPLICATIONS: none  CONDITION: stable  Edwyna Dangerfield, Dolores Lory 05/16/2015<10:54 AM

## 2015-05-16 NOTE — Transfer of Care (Signed)
Immediate Anesthesia Transfer of Care Note  Patient: Bonnie Henry  Procedure(s) Performed: Procedure(s): ENDARTERECTOMY CAROTID (Right)  Patient Location: PACU  Anesthesia Type:General  Level of Consciousness: awake, alert  and oriented  Airway & Oxygen Therapy: Patient Spontanous Breathing and Patient connected to face mask oxygen  Post-op Assessment: Report given to RN and Post -op Vital signs reviewed and stable  Post vital signs: Reviewed and stable  Last Vitals:  Filed Vitals:   05/16/15 0623 05/16/15 1101  BP: 157/68 157/60  Pulse: 65 87  Temp: 37.1 C   Resp: 22 17    Last Pain:  Filed Vitals:   05/16/15 1101  PainSc: 2          Complications: No apparent anesthesia complications

## 2015-05-16 NOTE — Anesthesia Postprocedure Evaluation (Signed)
Anesthesia Post Note  Patient: Bonnie Henry  Procedure(s) Performed: Procedure(s) (LRB): ENDARTERECTOMY CAROTID (Right)  Patient location during evaluation: PACU Anesthesia Type: General Level of consciousness: awake and alert Pain management: satisfactory to patient Vital Signs Assessment: post-procedure vital signs reviewed and stable Respiratory status: nonlabored ventilation Cardiovascular status: blood pressure returned to baseline Anesthetic complications: no    Last Vitals:  Filed Vitals:   05/16/15 0623 05/16/15 1101  BP: 157/68 157/60  Pulse: 65 87  Temp: 37.1 C   Resp: 22 17    Last Pain:  Filed Vitals:   05/16/15 1109  PainSc: 2                  VAN STAVEREN,Valentin Benney

## 2015-05-16 NOTE — Anesthesia Preprocedure Evaluation (Signed)
Anesthesia Evaluation  Patient identified by MRN, date of birth, ID band Patient awake    Reviewed: Allergy & Precautions, NPO status , Patient's Chart, lab work & pertinent test results  Airway Mallampati: III       Dental  (+) Upper Dentures, Lower Dentures   Pulmonary shortness of breath, sleep apnea , COPD, former smoker,     + decreased breath sounds      Cardiovascular Exercise Tolerance: Poor hypertension, Pt. on medications and Pt. on home beta blockers + CAD, + Past MI and + Peripheral Vascular Disease  + Valvular Problems/Murmurs AS  Rhythm:Regular     Neuro/Psych  Headaches, Anxiety Depression    GI/Hepatic GERD  ,(+) Hepatitis -, C  Endo/Other  diabetes, Type 1, Insulin DependentHypothyroidism Morbid obesity  Renal/GU negative Renal ROS     Musculoskeletal   Abdominal (+) + obese,   Peds  Hematology  (+) anemia ,   Anesthesia Other Findings   Reproductive/Obstetrics                             Anesthesia Physical Anesthesia Plan  ASA: IV  Anesthesia Plan: General   Post-op Pain Management:    Induction: Intravenous  Airway Management Planned: Oral ETT  Additional Equipment: Arterial line  Intra-op Plan:   Post-operative Plan: Extubation in OR  Informed Consent: I have reviewed the patients History and Physical, chart, labs and discussed the procedure including the risks, benefits and alternatives for the proposed anesthesia with the patient or authorized representative who has indicated his/her understanding and acceptance.     Plan Discussed with: CRNA  Anesthesia Plan Comments:         Anesthesia Quick Evaluation

## 2015-05-16 NOTE — Discharge Summary (Signed)
[facility]    Discharge Summary  Patient ID:  Bonnie Henry MRN: [id number] DOB/AGE: [birth year] 75 y.o.  Admit date: 05/16/2015 Discharge date: 05/19/2015 Date of Surgery: 05/16/2015 Surgeon: Surgeon(s): Katha Cabal, MD  Admission Diagnosis: CAROTID ARTERY STENOSIS  Discharge Diagnoses:  CAROTID ARTERY STENOSIS  Secondary Diagnoses: Past Medical History  Diagnosis Date  . Diabetes mellitus without complication ([place])   . Atrial fibrillation ([place])   . Hypertension   . GERD (gastroesophageal reflux disease)   . CAD (coronary artery disease)   . Hypercholesteremia   . COPD (chronic obstructive pulmonary disease) ([place])   . Morbid obesity ([place])   . Post-surgical hypothyroidism   . Depression, major ([place])   . Neuropathy ([place])   . Asthma   . Sleep apnea     "2015 wore mask; lost weight; told didn't need mask anymore" (02/23/2014)  . Type II diabetes mellitus ([place])   . Anxiety   . Anemia   . History of blood transfusion     "when I had my mastectomy"  . Chronic back pain   . Breast cancer, left breast ([facility])     S/P mastectomy  . Myocardial infarction ([place])   . Shortness of breath dyspnea   . Heart murmur   . Headache   . History of hiatal hernia   . Constipation 03/04/2015  . CHF (congestive heart failure) ([place])   . Peripheral vascular disease ([place])   . Hepatitis     HEP "C"  . Arthritis     "joints ache all over" . osteoarthritis  . DVT (deep venous thrombosis) ([place])   . Restless leg syndrome    Procedure(s): ENDARTERECTOMY CAROTID  Discharged Condition: good  HPI:  Bonnie Henry is a 75 y.o. y.o. female who presents with right carotid stenosis of 90%. On 05/16/15, the patient underwent a right carotid endarterectomy with CorMatrix arterial patch reconstruction. She tolerated the procedure well and was transferred to the PACU then ICU for close observation without issue. Her night of surgery was unremarkable.   POD#1:  Patient complaining of dysphagia. Seen by in consult by ENT (Dr. Richardson Landry) who felt the patient had some hematoma formation which produced some swelling in the right hypopharynx. Her vocal cords moved normally. There was no evidence of expanding hematoma. He recommended no intervention.  POD#2: Patient with drop in Hbg to 6.2. PICC line inserted for transfusion of 2 uPRBC. Post-transfusion Hbg 9.4. Patient also underwent a CTA of the neck which was notable for recent carotid endarterectomy on the right. Good appearance of the vessel with wide patency and no evidence of pseudoaneurysm. Fluid and air in the operative region, not unexpected 2 days following procedure. Components present both deep and superficial to the surgical region. Superficial component measures up to 3 cm and could be associated with right neck swelling. I do not see evidence of an airway or esophageal injury. Unchanged since the previous study are 50% stenoses of the subclavian arteries proximal to the vertebral artery origins. 30-50% stenosis of the left vertebral artery origin. 50% stenosis of both vertebral arteries just beyond the foramen magnum  level.  Seen and worked with PT during her inpatient stay. No recommendations to SNF.  POD#3: Patient tolerating a regular diet, urinating normally, pain was controlled through PO medications and ambulating (at her baseline). Swallowing has drastically improved. Discharge Hbg: 8.2  PICC line removed upon discharge.  Patient being discharged with home health nursing services.   Hospital Course:  Bonnie Henry is a 75 y.o. female is S/P Right Procedure(s): ENDARTERECTOMY CAROTID  Extubated: POD # 0  Physical exam:  A&Ox3, NAD Neck: Incision: clean, dry and intact. Ecchymosis noted tracking across neck however starting to resolve. Minimal swelling.  Face: No facial drop. Tongue midline. CV: RRR Pulmonary: CTA Bilaterally PICC line: intact, clean and dry. Abdomen: Soft,  Nontender, Nondistended Vascular: Bilateral lower extremities warm, non-tender, minimal edema  Post-op wounds clean, dry, intact or healing well  Pt. Ambulating, voiding and taking PO diet without difficulty.  Pt pain controlled with PO pain meds.  Labs as below  Complications:none  Consults:  Treatment Team:  Clyde Canterbury, MDENT  Significant Diagnostic Studies: CBC Lab Results  Component Value Date   WBC 9.5 05/19/2015   HGB 8.2* 05/19/2015   HCT 24.9* 05/19/2015   MCV 76.2* 05/19/2015   PLT 126* 05/19/2015   BMET    Component Value Date/Time   NA 141 05/19/2015 0635   NA 141 02/20/2015 0948   NA 143 09/01/2013 1134   K 3.6 05/19/2015 0635   K 3.5 09/01/2013 1134   CL 104 05/19/2015 0635   CL 102 09/01/2013 1134   CO2 31 05/19/2015 0635   CO2 33* 09/01/2013 1134   GLUCOSE 147* 05/19/2015 0635   GLUCOSE 144* 02/20/2015 0948   GLUCOSE 151* 09/01/2013 1134   BUN 13 05/19/2015 0635   BUN 14 02/20/2015 0948   BUN 13 09/01/2013 1134   CREATININE 0.81 05/19/2015 0635   CREATININE 1.06 09/01/2013 1134   CALCIUM 8.2* 05/19/2015 0635   CALCIUM 8.8 09/01/2013 1134   GFRNONAA >60 05/19/2015 0635   GFRNONAA 53* 09/01/2013 1134   GFRAA >60 05/19/2015 0635   GFRAA >60 09/01/2013 1134   COAG Lab Results  Component Value Date   INR 1.00 05/08/2015   INR 0.99 02/24/2014   Disposition:  Discharge to :Home    Medication List    STOP taking these medications        cephALEXin 500 MG capsule  Commonly known as:  KEFLEX      TAKE these medications        acetaminophen 500 MG tablet  Commonly known as:  TYLENOL  Take 2 tablets (1,000 mg total) by mouth every 6 (six) hours as needed for moderate pain.     acetylcysteine 20 % nebulizer solution  Commonly known as:  MUCOMYST  Take by nebulization as needed.     ARTIFICIAL TEARS OP  Place 1 drop into both eyes 2 (two) times daily as needed (dry eyes).     aspirin EC 81 MG tablet  Take 81 mg by mouth daily.      atorvastatin 40 MG tablet  Commonly known as:  LIPITOR  TAKE ONE TABLET BY MOUTH AT BEDTIME.     bumetanide 1 MG tablet  Commonly known as:  BUMEX  Take 1 mg by mouth daily.     busPIRone 15 MG tablet  Commonly known as:  BUSPAR  Take 1 tablet (15 mg total) by mouth 2 (two) times daily.     carvedilol 12.5 MG tablet  Commonly known as:  COREG  Take 12.5 mg by mouth 2 (two) times daily with a meal.     fluticasone 50 MCG/ACT nasal spray  Commonly known as:  FLONASE  Place 1 spray into both nostrils 2 (two) times daily as needed for allergies.     gabapentin 300 MG capsule  Commonly known as:  NEURONTIN  Take 1  capsule (300 mg total) by mouth 2 (two) times daily.     HUMALOG 100 UNIT/ML injection  Generic drug:  insulin lispro  Inject 12 Units into the skin 3 (three) times daily with meals. PER SLIDING SCALE     insulin glargine 100 UNIT/ML injection  Commonly known as:  LANTUS  Inject 0.3 mLs (30 Units total) into the skin daily.     lactulose 10 GM/15ML solution  Commonly known as:  CHRONULAC  Take 15 mLs (10 g total) by mouth 2 (two) times daily as needed for mild constipation.     levothyroxine 100 MCG tablet  Commonly known as:  SYNTHROID, LEVOTHROID  Take 1 tablet (100 mcg total) by mouth every other day. (alternate with the 112 mcg strength)     levothyroxine 112 MCG tablet  Commonly known as:  SYNTHROID, LEVOTHROID  Take 1 tablet (112 mcg total) by mouth every other day. (alternate with the 100 mcg strength)     montelukast 10 MG tablet  Commonly known as:  SINGULAIR  Take 1 tablet (10 mg total) by mouth daily.     nitroGLYCERIN 0.4 MG SL tablet  Commonly known as:  NITROSTAT  Place 0.4 mg under the tongue every 5 (five) minutes as needed for chest pain.     omeprazole 20 MG capsule  Commonly known as:  PRILOSEC  TAKE ONE CAPSULE BY MOUTH TWICE DAILY AS NEEDED (USE  MAY  INCREASE  RISK OF PNEUMONIA, OSTEOPOROSIS, AND ANEMIA)     oxyCODONE 5 MG  immediate release tablet  Commonly known as:  Oxy IR/ROXICODONE  Take 1 tablet (5 mg total) by mouth every 6 (six) hours as needed for moderate pain or severe pain.     PROAIR HFA 108 (90 Base) MCG/ACT inhaler  Generic drug:  albuterol  Inhale 2 puffs into the lungs every 4 (four) hours as needed for wheezing or shortness of breath.     albuterol 0.63 MG/3ML nebulizer solution  Commonly known as:  ACCUNEB  Take 1 ampule by nebulization every 6 (six) hours as needed for wheezing.     QUEtiapine 25 MG tablet  Commonly known as:  SEROQUEL  Take 0.5 tablets (12.5 mg total) by mouth at bedtime.     rOPINIRole 1 MG tablet  Commonly known as:  REQUIP  Take 1 tablet (1 mg total) by mouth 3 (three) times daily.     senna 8.6 MG Tabs tablet  Commonly known as:  SENOKOT  Take 2 tablets by mouth at bedtime.     sertraline 100 MG tablet  Commonly known as:  ZOLOFT  Take 1.5 tablets (150 mg total) by mouth daily.     SPIRIVA HANDIHALER 18 MCG inhalation capsule  Generic drug:  tiotropium  Place 18 mcg into inhaler and inhale daily.     spironolactone 25 MG tablet  Commonly known as:  ALDACTONE  Take 25 mg by mouth daily.     Standard TENS Devi  1 Units by Does not apply route daily.     SYMBICORT 160-4.5 MCG/ACT inhaler  Generic drug:  budesonide-formoterol  INHALE TWO PUFFS BY MOUTH TWICE DAILY . RINSE MOUTH AFTER EACH USE     tiZANidine 2 MG tablet  Commonly known as:  ZANAFLEX  Take 1 tablet (2 mg total) by mouth at bedtime.       Verbal and written Discharge instructions given to the patient. Wound care per Discharge AVS Follow-up Information    Follow up with Schnier, Belenda Cruise  G, MD In 1 week.   Specialties:  Vascular Surgery, Cardiology, Radiology, Vascular Surgery   Why:  Postop Check   Contact information:   [place] [address] [phone]       Follow up with Enid Derry, MD In 1 week.   Specialty:  Family Medicine   Why:  Blood work /  medication check    Contact information:   [address] [place] [address] [phone]      Signed: Sela Hua, PA-C  05/19/2015, 1:00 PM

## 2015-05-17 ENCOUNTER — Ambulatory Visit: Payer: Medicare Other

## 2015-05-17 LAB — BASIC METABOLIC PANEL
ANION GAP: 8 (ref 5–15)
BUN: 17 mg/dL (ref 6–20)
CALCIUM: 7.8 mg/dL — AB (ref 8.9–10.3)
CO2: 29 mmol/L (ref 22–32)
Chloride: 102 mmol/L (ref 101–111)
Creatinine, Ser: 1.06 mg/dL — ABNORMAL HIGH (ref 0.44–1.00)
GFR, EST AFRICAN AMERICAN: 59 mL/min — AB (ref >60)
GFR, EST NON AFRICAN AMERICAN: 51 mL/min — AB (ref >60)
GLUCOSE: 125 mg/dL — AB (ref 65–99)
POTASSIUM: 3.7 mmol/L (ref 3.5–5.1)
SODIUM: 139 mmol/L (ref 135–145)

## 2015-05-17 LAB — CBC
HCT: 23.5 % — ABNORMAL LOW (ref 35.0–47.0)
HEMATOCRIT: 22.5 % — AB (ref 35.0–47.0)
HEMOGLOBIN: 7.5 g/dL — AB (ref 12.0–16.0)
HEMOGLOBIN: 7 g/dL — AB (ref 12.0–16.0)
MCH: 23.9 pg — AB (ref 26.0–34.0)
MCH: 23.2 pg — AB (ref 26.0–34.0)
MCHC: 31.8 g/dL — ABNORMAL LOW (ref 32.0–36.0)
MCHC: 31 g/dL — ABNORMAL LOW (ref 32.0–36.0)
MCV: 75.1 fL — ABNORMAL LOW (ref 80.0–100.0)
MCV: 74.8 fL — AB (ref 80.0–100.0)
PLATELETS: 162 10*3/uL (ref 150–440)
PLATELETS: 138 10*3/uL — AB (ref 150–440)
RBC: 3.13 MIL/uL — AB (ref 3.80–5.20)
RBC: 3.01 MIL/uL — AB (ref 3.80–5.20)
RDW: 18.2 % — ABNORMAL HIGH (ref 11.5–14.5)
RDW: 18.3 % — ABNORMAL HIGH (ref 11.5–14.5)
WBC: 11.4 10*3/uL — AB (ref 3.6–11.0)
WBC: 12.8 10*3/uL — AB (ref 3.6–11.0)

## 2015-05-17 LAB — SURGICAL PATHOLOGY

## 2015-05-17 MED ORDER — OXYCODONE HCL 5 MG PO TABS: 5.0000 mg | ORAL_TABLET | Freq: Four times a day (QID) | ORAL | Status: DC | PRN

## 2015-05-17 MED ORDER — ASPIRIN 81 MG PO CHEW
81.0000 mg | CHEWABLE_TABLET | Freq: Every day | ORAL | Status: DC
  Administered 2015-05-17 – 2015-05-19 (×3): 81 mg via ORAL
  Filled 2015-05-17 (×3): qty 1

## 2015-05-17 NOTE — Progress Notes (Signed)
Patient rested well throughout the night.  Blood pressure stable and urine output adequate this am, arterial line and foley catheter discontinued per orders.  No further complaints of pain, site remains slightly swollen but no more so than during the shift.  Will continue to monitor.

## 2015-05-17 NOTE — Progress Notes (Signed)
Initial Nutrition Assessment      INTERVENTION:  -Monitor intake and cater to pt preferences -If unable to meet nutritional needs will add supplement   NUTRITION DIAGNOSIS:   Inadequate oral intake related to acute illness as evidenced by per patient/family report.    GOAL:   Patient will meet greater than or equal to 90% of their needs    MONITOR:   PO intake  REASON FOR ASSESSMENT:   Malnutrition Screening Tool    ASSESSMENT:   Past Medical History  Diagnosis Date  . Diabetes mellitus without complication ([place])   . Atrial fibrillation ([place])   . Hypertension   . GERD (gastroesophageal reflux disease)   . CAD (coronary artery disease)   . Hypercholesteremia   . COPD (chronic obstructive pulmonary disease) ([place])   . Morbid obesity ([place])   . Post-surgical hypothyroidism   . Depression, major ([place])   . Neuropathy ([place])   . Asthma   . Sleep apnea     "2015 wore mask; lost weight; told didn't need mask anymore" (02/23/2014)  . Type II diabetes mellitus ([place])   . Anxiety   . Anemia   . History of blood transfusion     "when I had my mastectomy"  . Chronic back pain   . Breast cancer, left breast ([facility])     S/P mastectomy  . Myocardial infarction ([place])   . Shortness of breath dyspnea   . Heart murmur   . Headache   . History of hiatal hernia   . Constipation 03/04/2015  . CHF (congestive heart failure) ([place])   . Peripheral vascular disease ([place])   . Hepatitis     HEP "C"  . Arthritis     "joints ache all over" . osteoarthritis  . DVT (deep venous thrombosis) ([place])   . Restless leg syndrome     75 y/o female s/p right carotid endarterectomy Pt reports good/normal intake prior to admission, eating 3 meals per day.  Reports sore throat at present and eating little bit of applesauce.   Medications reviewed:colace, protonix, senna, Ns with KCL at 73ml/hr, lactulose, miralax  Labs reviewed: creatinine 1.06, glucose 125  Nutrition-Focused physical exam  completed. Findings are no fat depletion, no muscle depletion, and unable to assess edema.    Diet Order:  Diet Carb Modified Fluid consistency:: Thin; Room service appropriate?: Yes  Skin:  Reviewed, no issues  Last BM:  5/9  Height:   Ht Readings from Last 1 Encounters:  05/16/15 5' (1.524 m)    Weight: Pt reports stable wt, noted wt gain per wt encounters  Wt Readings from Last 1 Encounters:  05/16/15 205 lb 4 oz (93.1 kg)   Wt Readings from Last 10 Encounters:  05/16/15 205 lb 4 oz (93.1 kg)  05/08/15 196 lb (88.905 kg)  05/04/15 196 lb (88.905 kg)  04/13/15 194 lb 8 oz (88.225 kg)  02/20/15 191 lb 9.6 oz (86.909 kg)  02/08/15 195 lb (88.451 kg)  01/22/15 194 lb (87.998 kg)  12/05/14 193 lb 6.4 oz (87.726 kg)  11/22/14 192 lb (87.091 kg)  10/30/14 195 lb (88.451 kg)     Ideal Body Weight:     BMI:  Body mass index is 40.08 kg/(m^2).  Estimated Nutritional Needs:   Kcal:  [id number] kcals/d.   Protein:  68-79 g/d  Fluid:  1.3-1.5 L/d  EDUCATION NEEDS:   No education needs identified at this time  Bonnie Henry B. Zenia Resides, [place], [place] (pager) Weekend/On-Call pager [phone])

## 2015-05-17 NOTE — Progress Notes (Signed)
RN made [employer], [place] aware that patient is having difficulty swallowing water this morning and can only tolerate ice chips at this time. No respiratory distress noted.  [place], [place] acknowledged and gave no new orders regarding dysphagia.

## 2015-05-17 NOTE — Evaluation (Signed)
Physical Therapy Evaluation Patient Details Name: Bonnie Henry MRN: [id number] DOB: 03/11/1940 Today's Date: 05/17/2015   History of Present Illness  Pt is POD#1 s/p right carotid endarterectomy with [employer] arterial patch reconstruction. At baseline pt uses home O2 and reports limited community ambulation. She is independent with ADLs but otherwise is generally low functioning at baseline. No specific questions or concerns at time of evaluation other than neck pain which has been addressed with RN per patient.  Clinical Impression  Pt demonstrates mobility which she reports is deviated but close to her baseline. She is generally fairly low functioning with respect to mobility and ambulates very limited community distances with rollator. When at stores she mostly uses a motorized cart. Pt is able to perform bed mobility, transfers and ambulation at sup/CGA level. She reports DOE however vitals remain WNL throughout distance. Pt denies lightheadedness or dizziness during ambulation. Unable to ambulate further at this time so pt returned to recliner. Pt is close to baseline and will not need further PT following discharge. Additionally pt states she would not agree to it as it "doesn't help me because my back has so much arthritis in it." Pt will benefit from skilled PT services to address deficits in strength, balance, and mobility in order to return to full function at home.    Follow Up Recommendations No PT follow up;Other (comment) (Pt is close to baseline)    Equipment Recommendations  None recommended by PT    Recommendations for Other Services       Precautions / Restrictions Precautions Precautions: Fall Restrictions Weight Bearing Restrictions: No      Mobility  Bed Mobility Overal bed mobility: Needs Assistance Bed Mobility: Supine to Sit     Supine to sit: Supervision     General bed mobility comments: Pt is able to roll onto R side but relies very heavily on bed  rails to go from R sidelying to sitting. Takes extended time and quickly fatigued.  Transfers Overall transfer level: Needs assistance Equipment used: Rolling walker (2 wheeled) Transfers: Sit to/from Stand Sit to Stand: Min guard         General transfer comment: Pt requires increased time to come to standing due to LE weakness. However she comes to standing safely and is stable with UE support on walker  Ambulation/Gait Ambulation/Gait assistance: Min guard Ambulation Distance (Feet): 150 Feet Assistive device: Rolling walker (2 wheeled) Gait Pattern/deviations: Decreased step length - right;Decreased step length - left;Trunk flexed Gait velocity: Decreased Gait velocity interpretation: <1.8 ft/sec, indicative of risk for recurrent falls General Gait Details: Pt with decreased step length bilaterally and overall fairly slow gait. Ambulates with 2L/min O2 which is what she utilizes at home. Pt reports DOE however SaO2 remains at or above 97% throughout entire distance. Vitals and fatigue continuously assessed during ambulation. Pt easily fatigued and unable to ambulate further at this time. She requires intermittent cues for safe use of rolling walker and to maintain her body within the confines of the walker  Stairs            Wheelchair Mobility    Modified Rankin (Stroke Patients Only)       Balance Overall balance assessment: Needs assistance Sitting-balance support: No upper extremity supported Sitting balance-Leahy Scale: Good     Standing balance support: No upper extremity supported Standing balance-Leahy Scale: Fair Standing balance comment: In wide stance she is able to maintain balance without UE support. No further balance assessment performed. Overall she  is limited and requires UE support at baseline                             Pertinent Vitals/Pain Pain Assessment: No/denies pain (Denies resting pain, neck pain increases to touch)    Home  Living Family/patient expects to be discharged to:: Private residence Living Arrangements: Children;Other relatives;Other (Comment) (Granddaughter) Available Help at Discharge: Family Type of Home: Mobile home Home Access: Stairs to enter Entrance Stairs-Rails: Right Entrance Stairs-Number of Steps: 6 Home Layout: One level Home Equipment: Clinical cytogeneticist - 2 wheels;Walker - 4 wheels;Cane - quad;Bedside commode      Prior Function Level of Independence: Needs assistance   Gait / Transfers Assistance Needed: Ambulates in home with RW, and in community with rollator. Ambulates limited community distances and uses motorized scooter at stores  ADL's / Homemaking Assistance Needed: Independent with ADLs but assist from daughter with IADLs        Hand Dominance   Dominant Hand: Right    Extremity/Trunk Assessment   Upper Extremity Assessment: Overall WFL for tasks assessed           Lower Extremity Assessment: Overall WFL for tasks assessed         Communication   Communication: No difficulties  Cognition Arousal/Alertness: Awake/alert Behavior During Therapy: WFL for tasks assessed/performed Overall Cognitive Status: Within Functional Limits for tasks assessed                      General Comments      Exercises        Assessment/Plan    PT Assessment Patient needs continued PT services  PT Diagnosis Difficulty walking;Abnormality of gait;Generalized weakness   PT Problem List Decreased strength;Decreased activity tolerance;Decreased balance;Decreased mobility;Obesity;Pain  PT Treatment Interventions DME instruction;Gait training;Stair training;Therapeutic activities;Therapeutic exercise;Balance training;Neuromuscular re-education;Patient/family education   PT Goals (Current goals can be found in the Care Plan section) Acute Rehab PT Goals Patient Stated Goal: Return to prior level of function at home PT Goal Formulation: With patient Time For Goal  Achievement: 05/31/15 Potential to Achieve Goals: Good    Frequency Min 2X/week   Barriers to discharge Inaccessible home environment 6 stairs to enter home    Co-evaluation               End of Session Equipment Utilized During Treatment: Gait belt;Oxygen Activity Tolerance: Patient limited by fatigue Patient left: in chair;with call bell/phone within reach;with chair alarm set           Time: [id number] PT Time Calculation (min) (ACUTE ONLY): 24 min   Charges:   PT Evaluation $PT Eval Moderate Complexity: 1 Procedure PT Treatments $Gait Training: 8-22 mins   PT G Codes:       Lyndel Safe Marissa Weaver PT, DPT   Kamara Allan 05/17/2015, 3:54 PM

## 2015-05-17 NOTE — Consult Note (Signed)
Bonnie Henry, Bonnie Henry [id number] 14-Jan-1940 Bonnie Nearing, MD  Reason for Consult: Evaluate dysphagia, rule out vocal cord paralysis Requesting Physician: Katha Cabal, MD Consulting Physician: Bonnie Henry  HPI: This 75 y.o. year old female was admitted on 05/16/2015 for CAROTID ARTERY STENOSIS. This patient is 1 day postop from a right carotid endarterectomy. She has had a previous left carotid endarterectomy. She has had some difficulty swallowing since her procedure, as well as some hoarseness. She denies any difficulty breathing. She did have a postoperative hematoma which Dr. Delana Meyer is carefully monitoring. I talked with him he does not feel this is an expanding hematoma as it has remained stable per his exam. He does not feel she needs intervention to drain the hematoma. She was able to swallow pills and drink liquids this afternoon. She did not eat this afternoon because her food was called, but feels she could have eaten [place] and potatoes without much difficulty since she was able to swallow pills.  Allergies:  Allergies  Allergen Reactions  . Vasotec [Enalapril] Swelling    Throat closes  . Enalapril Other (See Comments)    Angioedema  . Codeine Nausea And Vomiting  . Morphine And Related Nausea And Vomiting    Medications:  Medications Prior to Admission  Medication Sig Dispense Refill  . acetaminophen (TYLENOL) 500 MG tablet Take 2 tablets (1,000 mg total) by mouth every 6 (six) hours as needed for moderate pain. 30 tablet 0  . acetylcysteine (MUCOMYST) 20 % nebulizer solution Take by nebulization as needed.     Marland Kitchen albuterol (ACCUNEB) 0.63 MG/3ML nebulizer solution Take 1 ampule by nebulization every 6 (six) hours as needed for wheezing.    Marland Kitchen albuterol (PROAIR HFA) 108 (90 BASE) MCG/ACT inhaler Inhale 2 puffs into the lungs every 4 (four) hours as needed for wheezing or shortness of breath.     Marland Kitchen atorvastatin (LIPITOR) 40 MG tablet TAKE ONE TABLET BY MOUTH AT BEDTIME.  30 tablet 4  . busPIRone (BUSPAR) 15 MG tablet Take 1 tablet (15 mg total) by mouth 2 (two) times daily. 60 tablet 6  . carvedilol (COREG) 12.5 MG tablet Take 12.5 mg by mouth 2 (two) times daily with a meal.     . fluticasone (FLONASE) 50 MCG/ACT nasal spray Place 1 spray into both nostrils 2 (two) times daily as needed for allergies.     Marland Kitchen gabapentin (NEURONTIN) 300 MG capsule Take 1 capsule (300 mg total) by mouth 2 (two) times daily. 60 capsule 3  . HUMALOG 100 UNIT/ML injection Inject 12 Units into the skin 3 (three) times daily with meals. PER SLIDING SCALE    . Hypromellose (ARTIFICIAL TEARS OP) Place 1 drop into both eyes 2 (two) times daily as needed (dry eyes).    . insulin glargine (LANTUS) 100 UNIT/ML injection Inject 0.3 mLs (30 Units total) into the skin daily. (Patient taking differently: Inject 38 Units into the skin daily. ) 1 vial 3  . lactulose (CHRONULAC) 10 GM/15ML solution Take 15 mLs (10 g total) by mouth 2 (two) times daily as needed for mild constipation. 1892 mL 5  . levothyroxine (SYNTHROID, LEVOTHROID) 100 MCG tablet Take 1 tablet (100 mcg total) by mouth every other day. (alternate with the 112 mcg strength) 15 tablet 2  . levothyroxine (SYNTHROID, LEVOTHROID) 112 MCG tablet Take 1 tablet (112 mcg total) by mouth every other day. (alternate with the 100 mcg strength) 15 tablet 2  . montelukast (SINGULAIR) 10 MG tablet Take 1 tablet (  10 mg total) by mouth daily. 30 tablet 11  . Nerve Stimulator (STANDARD TENS) DEVI 1 Units by Does not apply route daily. 1 Device 0  . omeprazole (PRILOSEC) 20 MG capsule TAKE ONE CAPSULE BY MOUTH TWICE DAILY AS NEEDED (USE  MAY  INCREASE  RISK OF PNEUMONIA, OSTEOPOROSIS, AND ANEMIA) 60 capsule 2  . QUEtiapine (SEROQUEL) 25 MG tablet Take 0.5 tablets (12.5 mg total) by mouth at bedtime. 15 tablet 6  . rOPINIRole (REQUIP) 1 MG tablet Take 1 tablet (1 mg total) by mouth 3 (three) times daily. 90 tablet 3  . senna (SENOKOT) 8.6 MG TABS tablet  Take 2 tablets by mouth at bedtime.     . sertraline (ZOLOFT) 100 MG tablet Take 1.5 tablets (150 mg total) by mouth daily. 45 tablet 6  . spironolactone (ALDACTONE) 25 MG tablet Take 25 mg by mouth daily.    . SYMBICORT 160-4.5 MCG/ACT inhaler INHALE TWO PUFFS BY MOUTH TWICE DAILY . RINSE MOUTH AFTER EACH USE 1 Inhaler 11  . tiotropium (SPIRIVA HANDIHALER) 18 MCG inhalation capsule Place 18 mcg into inhaler and inhale daily.    Marland Kitchen tiZANidine (ZANAFLEX) 2 MG tablet Take 1 tablet (2 mg total) by mouth at bedtime. 30 tablet 5  . aspirin EC 81 MG tablet Take 81 mg by mouth daily.    . bumetanide (BUMEX) 1 MG tablet Take 1 mg by mouth daily.    . cephALEXin (KEFLEX) 500 MG capsule Take 1 capsule (500 mg total) by mouth 3 (three) times daily. 30 capsule 0  . nitroGLYCERIN (NITROSTAT) 0.4 MG SL tablet Place 0.4 mg under the tongue every 5 (five) minutes as needed for chest pain.    .  Current Facility-Administered Medications  Medication Dose Route Frequency Provider Last Rate Last Dose  . 0.9 % NaCl with KCl 20 mEq/ L  infusion   Intravenous Continuous Katha Cabal, MD 75 mL/hr at 05/17/15 0800    . acetaminophen (TYLENOL) tablet 325-650 mg  325-650 mg Oral Q4H PRN Katha Cabal, MD       Or  . acetaminophen (TYLENOL) suppository 325-650 mg  325-650 mg Rectal Q4H PRN Katha Cabal, MD      . acetaminophen (TYLENOL) tablet 1,000 mg  1,000 mg Oral Q6H PRN Katha Cabal, MD      . acetylcysteine (MUCOMYST) 20 % nebulizer / oral solution 3 mL  3 mL Nebulization PRN Katha Cabal, MD      . albuterol (PROVENTIL) (2.5 MG/3ML) 0.083% nebulizer solution 2.5 mg  2.5 mg Nebulization Q4H PRN Katha Cabal, MD      . alum & mag hydroxide-simeth (MAALOX/MYLANTA) 200-200-20 MG/5ML suspension 15-30 mL  15-30 mL Oral Q2H PRN Katha Cabal, MD      . antiseptic oral rinse (CPC / CETYLPYRIDINIUM CHLORIDE 0.05%) solution 7 mL  7 mL Mouth Rinse q12n4p Katha Cabal, MD   7 mL at 05/17/15  1600  . artificial tears (LACRILUBE) ophthalmic ointment   Both Eyes BID PRN Katha Cabal, MD      . aspirin chewable tablet 81 mg  81 mg Oral Daily Lenis Noon, RPH   81 mg at 05/17/15 1125  . atorvastatin (LIPITOR) tablet 40 mg  40 mg Oral QHS Katha Cabal, MD   40 mg at 05/16/15 2246  . bumetanide (BUMEX) tablet 1 mg  1 mg Oral Daily Katha Cabal, MD   1 mg at 05/16/15 1100  . busPIRone (  BUSPAR) tablet 15 mg  15 mg Oral BID Katha Cabal, MD   15 mg at 05/16/15 1855  . carvedilol (COREG) tablet 12.5 mg  12.5 mg Oral BID WC Katha Cabal, MD   12.5 mg at 05/17/15 1125  . chlorhexidine (PERIDEX) 0.12 % solution 15 mL  15 mL Mouth Rinse BID Katha Cabal, MD   15 mL at 05/17/15 1137  . docusate sodium (COLACE) capsule 100 mg  100 mg Oral Daily Katha Cabal, MD   100 mg at 05/17/15 1056  . fluticasone (FLONASE) 50 MCG/ACT nasal spray 1 spray  1 spray Each Nare BID PRN Katha Cabal, MD      . gabapentin (NEURONTIN) capsule 300 mg  300 mg Oral BID Katha Cabal, MD   300 mg at 05/16/15 2246  . hydrALAZINE (APRESOLINE) injection 5 mg  5 mg Intravenous Q20 Min PRN Katha Cabal, MD      . labetalol (NORMODYNE,TRANDATE) injection 10 mg  10 mg Intravenous Q10 min PRN Katha Cabal, MD      . lactulose (CHRONULAC) 10 GM/15ML solution 10 g  10 g Oral BID PRN Katha Cabal, MD      . levothyroxine (SYNTHROID, LEVOTHROID) tablet 100 mcg  100 mcg Oral QODAY Katha Cabal, MD   100 mcg at 05/16/15 1300  . levothyroxine (SYNTHROID, LEVOTHROID) tablet 112 mcg  112 mcg Oral QODAY Katha Cabal, MD   112 mcg at 05/17/15 0600  . metoprolol (LOPRESSOR) injection 5 mg  5 mg Intravenous Q6H Katha Cabal, MD   5 mg at 05/17/15 0347  . mometasone-formoterol (DULERA) 200-5 MCG/ACT inhaler 2 puff  2 puff Inhalation BID Katha Cabal, MD   2 puff at 05/17/15 1122  . montelukast (SINGULAIR) tablet 10 mg  10 mg Oral Daily Katha Cabal, MD   10 mg at  05/17/15 1159  . nitroGLYCERIN (NITROSTAT) SL tablet 0.4 mg  0.4 mg Sublingual Q5 min PRN Katha Cabal, MD      . ondansetron [facility]) injection 4 mg  4 mg Intravenous Q6H PRN Katha Cabal, MD      . oxyCODONE (Oxy IR/ROXICODONE) immediate release tablet 5-10 mg  5-10 mg Oral Q4H PRN Katha Cabal, MD      . pantoprazole (PROTONIX) EC tablet 40 mg  40 mg Oral Daily Katha Cabal, MD   40 mg at 05/17/15 1058  . polyethylene glycol (MIRALAX / GLYCOLAX) packet 17 g  17 g Oral Daily PRN Katha Cabal, MD      . QUEtiapine (SEROQUEL) tablet 12.5 mg  12.5 mg Oral QHS Katha Cabal, MD   12.5 mg at 05/16/15 2255  . rOPINIRole (REQUIP) tablet 1 mg  1 mg Oral TID Katha Cabal, MD   1 mg at 05/17/15 1708  . senna (SENOKOT) tablet 17.2 mg  2 tablet Oral QHS Katha Cabal, MD   17.2 mg at 05/16/15 2246  . sertraline (ZOLOFT) tablet 150 mg  150 mg Oral Daily Katha Cabal, MD   150 mg at 05/17/15 1130  . spironolactone (ALDACTONE) tablet 25 mg  25 mg Oral Daily Katha Cabal, MD   25 mg at 05/16/15 1100  . tiotropium (SPIRIVA) inhalation capsule 18 mcg  18 mcg Inhalation Daily Katha Cabal, MD   18 mcg at 05/17/15 1121  . tiZANidine (ZANAFLEX) tablet 2 mg  2 mg Oral QHS Katha Cabal, MD  2 mg at 05/16/15 2246    PMH:  Past Medical History  Diagnosis Date  . Diabetes mellitus without complication ([place])   . Atrial fibrillation ([place])   . Hypertension   . GERD (gastroesophageal reflux disease)   . CAD (coronary artery disease)   . Hypercholesteremia   . COPD (chronic obstructive pulmonary disease) ([place])   . Morbid obesity ([place])   . Post-surgical hypothyroidism   . Depression, major ([place])   . Neuropathy ([place])   . Asthma   . Sleep apnea     "2015 wore mask; lost weight; told didn't need mask anymore" (02/23/2014)  . Type II diabetes mellitus ([place])   . Anxiety   . Anemia   . History of blood transfusion     "when I had my mastectomy"  . Chronic  back pain   . Breast cancer, left breast (H[facility])     S/P mastectomy  . Myocardial infarction ([place])   . Shortness of breath dyspnea   . Heart murmur   . Headache   . History of hiatal hernia   . Constipation 03/04/2015  . CHF (congestive heart failure) ([place])   . Peripheral vascular disease ([place])   . Hepatitis     HEP "C"  . Arthritis     "joints ache all over" . osteoarthritis  . DVT (deep venous thrombosis) ([place])   . Restless leg syndrome     Fam Hx:  Family History  Problem Relation Age of Onset  . Arthritis Mother   . Cancer Mother   . Diabetes Mother   . Heart disease Mother   . Hyperlipidemia Mother   . Hypertension Mother   . Alcohol abuse Father   . Alcohol abuse Brother   . Arthritis Brother   . Asthma Brother   . HIV Brother   . Cancer Brother   . Diabetes Brother   . Hyperlipidemia Brother   . Hypertension Brother   . Lung disease Brother   . Arthritis Maternal Grandmother   . Brain cancer Father     Soc Hx:  Social History   Social History  . Marital Status: Widowed    Spouse Name: N/A  . Number of Children: N/A  . Years of Education: N/A   Occupational History  . Not on file.   Social History Main Topics  . Smoking status: Former Smoker -- 1.50 packs/day for 44 years    Types: Cigarettes    Quit date: 01/06/1994  . Smokeless tobacco: Never Used     Comment: "quit smoking cigarettes in ~ 2000"  . Alcohol Use: No     Comment: 02/23/2014 "no alcohol since 1990's"  . Drug Use: No  . Sexual Activity: No   Other Topics Concern  . Not on file   Social History Narrative   Lives with daughter in a mobile home.  Has 4 children alive, 1 deceased.  Retired from [employer].  Education: high school.    PSH:  Past Surgical History  Procedure Laterality Date  . Bartholin cyst marsupialization    . Trigger finger release Left   . Cataract extraction w/ intraocular lens  implant, bilateral Bilateral ~ 2011  . Total knee arthroplasty Left 1990's  .  Total hip arthroplasty Bilateral 1990's  . Thyroidectomy, partial  1950's  . Shoulder surgery Right     "cleaned it out; no scope"  . Toe surgery Right     "cut piece of bone out so toe didn't dig into my foot"  .  Carotid endarterectomy Left 2009  . Hammer toe surgery Left     2nd and 3rd digits  . Tonsillectomy  1950's  . Mastectomy Left 1990's  . Tubal ligation    . Vaginal hysterectomy      "partial"  . Coronary angioplasty with stent placement  1990's X 2    "2; 1"  . Cardiac catheterization  2000's  . Left heart catheterization with coronary angiogram N/A 02/24/2014    Procedure: LEFT HEART CATHETERIZATION WITH CORONARY ANGIOGRAM;  Surgeon: Leonie Man, MD;  Location: [facility] CATH LAB;  Service: Cardiovascular;  Laterality: N/A;  . Artery biopsy Right 09/29/2014    Procedure: BIOPSY TEMPORAL ARTERY;  Surgeon: Angelia Mould, MD;  Location: [facility] OR;  Service: Vascular;  Laterality: Right;  . Esophagogastroduodenoscopy (egd) with propofol N/A 12/12/2014    Procedure: ESOPHAGOGASTRODUODENOSCOPY (EGD) WITH PROPOFOL;  Surgeon: Lucilla Lame, MD;  Location: [facility] ENDOSCOPY;  Service: Endoscopy;  Laterality: N/A;  . Breast biopsy Left     Mastectomy  . Eye surgery Bilateral     Catract Extraction with IOL  . Joint replacement Bilateral     Total Hip Replacement  . Joint replacement Left     Total Knee Replacement  . Endarterectomy Right 05/16/2015    Procedure: ENDARTERECTOMY CAROTID;  Surgeon: Katha Cabal, MD;  Location: [facility] ORS;  Service: Vascular;  Laterality: Right;  . Procedures since admission: ENDARTERECTOMY CAROTID  ROS: Review of systems normal other than 12 systems except per HPI.  PHYSICAL EXAM Vitals:  Filed Vitals:   05/17/15 1430 05/17/15 1706  BP: 160/66 116/41  Pulse:    Temp:    Resp:    . General: Well-developed, Well-nourished in no acute distress, obese Mood: Mood and affect well adjusted, pleasant and cooperative. Orientation: Grossly alert and  oriented. Vocal Quality: Slightly gravelly voice without breathiness. Communicates verbally. head and Face: NCAT. No facial asymmetry. No visible skin lesions. No significant facial scars. No tenderness with sinus percussion. Facial strength normal and symmetric. Ears: External ears with normal landmarks, no lesions. External auditory canals free of infection, cerumen impaction or lesions. Tympanic membranes intact with good landmarks and normal mobility on pneumatic otoscopy. No middle ear effusion. Hearing: Speech reception grossly normal. Nose: External nose normal with midline dorsum and no lesions or deformity. Nasal Cavity reveals essentially midline septum with normal inferior turbinates. No significant mucosal congestion or erythema. Nasal secretions are minimal and clear. No polyps seen on anterior rhinoscopy. Oral Cavity/ Oropharynx: Lips are normal with no lesions. Teeth no frank dental caries. Gingiva healthy with no lesions or gingivitis. Oropharynx including tongue, buccal mucosa, floor of mouth, hard and soft palate, uvula and posterior pharynx free of exudates, erythema or lesions with normal symmetry and hydration.  Indirect Laryngoscopy/Nasopharyngoscopy: Visualization of the larynx, hypopharynx and nasopharynx is not possible in this setting with routine examination. Neck: Supple with ecchymosis across the neck and an incision on the right with some tenderness there. Other than some edema immediately surrounding the wound however, the neck is ecchymotic but soft. The salivary glands are unremarkable. There is no palpable thyromegaly.  Lymphatic: Cervical lymph nodes are without palpable lymphadenopathy or tenderness. Respiratory: Normal respiratory effort without labored breathing. No stridor Cardiovascular: Carotid pulse shows regular rate and rhythm Neurologic: Cranial Nerves II through XII are grossly intact. Eyes: Gaze and Ocular Motility are grossly normal. PERRLA. No visible  nystagmus.  MEDICAL DECISION MAKING: Data Review:  Results for orders placed or performed during the hospital encounter  of 05/16/15 (from the past 48 hour(s))  Glucose, capillary     Status: Abnormal   Collection Time: 05/16/15  6:20 AM  Result Value Ref Range   Glucose-Capillary 164 (H) 65 - 99 mg/dL  I-STAT 4, (NA,K, GLUC, HGB,HCT)     Status: Abnormal   Collection Time: 05/16/15  6:28 AM  Result Value Ref Range   Sodium 140 135 - 145 mmol/L   Potassium 3.5 3.5 - 5.1 mmol/L   Glucose, Bld 171 (H) 65 - 99 mg/dL   HCT 30.0 (L) 36.0 - 46.0 %   Hemoglobin 10.2 (L) 12.0 - 15.0 g/dL  Glucose, capillary     Status: Abnormal   Collection Time: 05/16/15 11:01 AM  Result Value Ref Range   Glucose-Capillary 138 (H) 65 - 99 mg/dL  CBC     Status: Abnormal   Collection Time: 05/17/15  3:21 AM  Result Value Ref Range   WBC 11.4 (H) 3.6 - 11.0 K/uL   RBC 3.13 (L) 3.80 - 5.20 MIL/uL   Hemoglobin 7.5 (L) 12.0 - 16.0 g/dL   HCT 23.5 (L) 35.0 - 47.0 %   MCV 75.1 (L) 80.0 - 100.0 fL   MCH 23.9 (L) 26.0 - 34.0 pg   MCHC 31.8 (L) 32.0 - 36.0 g/dL   RDW 18.2 (H) 11.5 - 14.5 %   Platelets 162 150 - 440 K/uL  Basic metabolic panel     Status: Abnormal   Collection Time: 05/17/15  3:21 AM  Result Value Ref Range   Sodium 139 135 - 145 mmol/L   Potassium 3.7 3.5 - 5.1 mmol/L   Chloride 102 101 - 111 mmol/L   CO2 29 22 - 32 mmol/L   Glucose, Bld 125 (H) 65 - 99 mg/dL   BUN 17 6 - 20 mg/dL   Creatinine, Ser 1.06 (H) 0.44 - 1.00 mg/dL   Calcium 7.8 (L) 8.9 - 10.3 mg/dL   GFR calc non Af Amer 51 (L) >60 mL/min   GFR calc Af Amer 59 (L) >60 mL/min    Comment: (NOTE) The eGFR has been calculated using the CKD EPI equation. This calculation has not been validated in all clinical situations. eGFR's persistently <60 mL/min signify possible Chronic Kidney Disease.    Anion gap 8 5 - 15  . No results found.Marland Kitchen   PROCEDURE: Procedure: Diagnostic Fiberoptic Nasolaryngoscopy Diagnosis: Dysphagia  status post right carotid endarterectomy Indications:  Evaluate for vocal cord paralysis Findings: the nasopharynx was clear. There is ecchymosis in the hypopharynx on the right side without mass effect. There is some ecchymosis and edema of the right area epiglottic fold which narrows the airway mildly, but not critically. The vocal cords are clear and freely mobile with no evidence of paralysis. The tongue base was unremarkable with no evidence of obvious edema. Description of Procedure: After discussing procedure and risks  (primarily nose bleed) with the patient, the nose was anesthetized with topical Lidocaine 4% and decongested with phenylephrine. A flexible fiberoptic scope was passed through the nasal cavity. The nasal cavity was inspected and the scope passed through the Nasopharynx to the region of the hypopharynx and larynx. The patient was instructed to phonate to assess vocal cord mobility. The tongue was extended to evaluate the tongue base completely. Valsalva was performed to insufflate the hypopharynx for improved examination. Findings are as noted above. The scope was withdrawn. The patient tolerated the procedure well.  ASSESSMENT:  Patient with postoperative dysphagia after right carotid endarterectomy. She has had  some hematoma formation and clearly this is producing some swelling in the right hypopharynx. Fortunately her vocal cords move normally. There is no evidence of expanding hematoma. The neck is not tense, and Dr. Delana Meyer has been following this closely and does not feel it is progressing.  PLAN:  It sounds like she can eat a soft diet and is certainly drinking liquids and swallowing pills. Her diet could be progressed as tolerated. If she has any episodes of choking that might suggest aspiration, a bedside swallowing evaluation could be performed, however she does not have any evidence of vocal cord movement restriction, so I have no reason to expect she would be at great risk  for aspiration. Obviously if the neck swelling shows signs of progression, intervention on the hematoma would be necessary but there is no evidence of that at this time and Dr. Delana Meyer is monitoring her closely.   Bonnie Nearing, MD 05/17/2015 6:25 PM

## 2015-05-17 NOTE — Progress Notes (Signed)
Patient check  Patient is comfortable laying absolutely flat in bed in no respiratory distress denies difficulty with swallowing.  Vital signs are stable. Right neck is supple there is ecchymoses present consistent with her surgery no evidence for severe or massive hematoma. Hemoglobin is stable.  Status post right carotid endarterectomy  Continuous ordered. Appreciate Dr. Reola Mosher help vocal cords normal. Continue to elevate head of bed to encourage venous drainage. Advanced diet as tolerated continue O2 by nasal cannula will reassess tomorrow for possible discharge

## 2015-05-17 NOTE — Progress Notes (Signed)
[facility]  Daily Progress Note   Subjective: 1 Day Post-Op: Right carotid endarterectomy with CorMatrix arterial patch reconstruction  Patient complaining of some incisional pain this AM. No issues overnight. Denies SOB or dysphagia.   Objective: Filed Vitals:   05/17/15 0500 05/17/15 0614 05/17/15 0700 05/17/15 0800  BP: 106/56 111/43 132/50 121/49  Pulse: 80 73 72 66  Temp:      TempSrc:      Resp: 14 13 13 11   Height:      Weight:      SpO2: 95% 95% 100% 100%    Intake/Output Summary (Last 24 hours) at 05/17/15 0820 Last data filed at 05/17/15 0800  Gross per 24 hour  Intake 2792.5 ml  Output   1020 ml  Net 1772.5 ml    Physical Exam: A&Ox3, NAD Neck: Incision: clean, dry and intact. Some ecchymosis noted. Minimal swelling.  Face: No facial drop. Tongue midline. CV: RRR Pulmonary: CTA Bilaterally Abdomen: Soft, Nontender, Nondistended Vascular: Bilateral lower extremities warm, non-tender, minimal edema   Laboratory: CBC    Component Value Date/Time   WBC 11.4* 05/17/2015 0321   WBC 8.2 02/20/2015 0948   WBC 8.0 09/01/2013 1134   HGB 7.5* 05/17/2015 0321   HGB 11.6* 09/01/2013 1134   HCT 23.5* 05/17/2015 0321   HCT 30.7* 02/20/2015 0948   HCT 36.1 09/01/2013 1134   PLT 162 05/17/2015 0321   PLT 256 02/20/2015 0948   PLT 178 09/01/2013 1134   BMET    Component Value Date/Time   NA 139 05/17/2015 0321   NA 141 02/20/2015 0948   NA 143 09/01/2013 1134   K 3.7 05/17/2015 0321   K 3.5 09/01/2013 1134   CL 102 05/17/2015 0321   CL 102 09/01/2013 1134   CO2 29 05/17/2015 0321   CO2 33* 09/01/2013 1134   GLUCOSE 125* 05/17/2015 0321   GLUCOSE 144* 02/20/2015 0948   GLUCOSE 151* 09/01/2013 1134   BUN 17 05/17/2015 0321   BUN 14 02/20/2015 0948   BUN 13 09/01/2013 1134   CREATININE 1.06* 05/17/2015 0321   CREATININE 1.06 09/01/2013 1134   CALCIUM 7.8* 05/17/2015 0321   CALCIUM 8.8 09/01/2013 1134   GFRNONAA 51* 05/17/2015  0321   GFRNONAA 53* 09/01/2013 1134   GFRAA 59* 05/17/2015 0321   GFRAA >60 09/01/2013 1134   Assessment/Planning: 75 year old female s/p Right carotid endarterectomy with CorMatrix arterial patch reconstruction - stable 1) drop in H&H to 7.5 - vitals stable, making urine 2) will observe throughout the day - if ambulating and tolerating diet - possible d/c this afternoon  [employer] PA-C 05/17/2015 8:20 AM

## 2015-05-17 NOTE — Progress Notes (Signed)
Patient moved to room 225 by bed with Chasity, NT.  2C tele monitor applied to patient prior to leaving ICU and verified with CCMD. Daughter at bedside going to new room.

## 2015-05-17 NOTE — Progress Notes (Signed)
Report called to Weeki [place]e, RN on [place].  Patient is A&Ox4 with no neuro deficits noted.  VSS on 2L nasal cannula. NSR per cardiac monitor. Only 25 cc voided since foley was removed. Right neck remains swollen and bruised with no drainage noted.  Patient has not been able to swallow well during shift, only ice chips and occasional bites of apple sauce and [place], PA was made aware this morning with no orders given.  Daughter at bedside.

## 2015-05-17 NOTE — Progress Notes (Signed)
RN spoke with Dr. Delana Meyer on the phone and made MD aware that patient is complaining of her throat burning and feeling like it was swelling more after she took 3 medicines crushed in apple sauce. Patient has no signs of respiratory distress and o2 sats are 100% on 2L.  MD acknowledged and stated it was fine to continue with transfer to [facility] and MD also spoke with patient's daughter on the phone. No orders given.

## 2015-05-18 ENCOUNTER — Inpatient Hospital Stay: Payer: Medicare Other

## 2015-05-18 ENCOUNTER — Encounter: Payer: Self-pay | Admitting: Radiology

## 2015-05-18 LAB — MAGNESIUM: Magnesium: 1.8 mg/dL (ref 1.7–2.4)

## 2015-05-18 LAB — CBC
HEMATOCRIT: 19.5 % — AB (ref 35.0–47.0)
HEMOGLOBIN: 6.2 g/dL — AB (ref 12.0–16.0)
MCH: 23.8 pg — ABNORMAL LOW (ref 26.0–34.0)
MCHC: 31.7 g/dL — AB (ref 32.0–36.0)
MCV: 75.1 fL — AB (ref 80.0–100.0)
Platelets: 130 10*3/uL — ABNORMAL LOW (ref 150–440)
RBC: 2.6 MIL/uL — ABNORMAL LOW (ref 3.80–5.20)
RDW: 18.2 % — AB (ref 11.5–14.5)
WBC: 7.9 10*3/uL (ref 3.6–11.0)

## 2015-05-18 LAB — BASIC METABOLIC PANEL
ANION GAP: 6 (ref 5–15)
BUN: 13 mg/dL (ref 6–20)
CALCIUM: 8.1 mg/dL — AB (ref 8.9–10.3)
CHLORIDE: 105 mmol/L (ref 101–111)
CO2: 28 mmol/L (ref 22–32)
Creatinine, Ser: 0.73 mg/dL (ref 0.44–1.00)
GFR calc Af Amer: >60 mL/min (ref >60)
GFR calc non Af Amer: >60 mL/min (ref >60)
GLUCOSE: 156 mg/dL — AB (ref 65–99)
Potassium: 3.8 mmol/L (ref 3.5–5.1)
Sodium: 139 mmol/L (ref 135–145)

## 2015-05-18 LAB — PREPARE RBC (CROSSMATCH)

## 2015-05-18 LAB — GLUCOSE, CAPILLARY
Glucose-Capillary: 159 mg/dL — ABNORMAL HIGH (ref 65–99)
Glucose-Capillary: 181 mg/dL — ABNORMAL HIGH (ref 65–99)

## 2015-05-18 LAB — HEMOGLOBIN AND HEMATOCRIT, BLOOD
HEMATOCRIT: 28.9 % — AB (ref 35.0–47.0)
HEMOGLOBIN: 9.4 g/dL — AB (ref 12.0–16.0)

## 2015-05-18 MED ORDER — DIPHENHYDRAMINE HCL 50 MG/ML IJ SOLN: 25.0000 mg | Freq: Once | INTRAMUSCULAR | Status: DC

## 2015-05-18 MED ORDER — IOPAMIDOL (ISOVUE-370) INJECTION 76%
75.0000 mL | Freq: Once | INTRAVENOUS | Status: AC | PRN
  Administered 2015-05-18: 75 mL via INTRAVENOUS

## 2015-05-18 MED ORDER — ACETAMINOPHEN 325 MG PO TABS
650.0000 mg | ORAL_TABLET | Freq: Once | ORAL | Status: AC
  Administered 2015-05-18: 650 mg via ORAL

## 2015-05-18 MED ORDER — DIPHENHYDRAMINE HCL 25 MG PO CAPS
ORAL_CAPSULE | ORAL | Status: AC
  Administered 2015-05-18: 25 mg
  Filled 2015-05-18: qty 1

## 2015-05-18 MED ORDER — POTASSIUM CHLORIDE CRYS ER 20 MEQ PO TBCR
20.0000 meq | EXTENDED_RELEASE_TABLET | Freq: Once | ORAL | Status: AC
  Administered 2015-05-18: 20 meq via ORAL
  Filled 2015-05-18: qty 1

## 2015-05-18 MED ORDER — FUROSEMIDE 10 MG/ML IJ SOLN
20.0000 mg | Freq: Once | INTRAMUSCULAR | Status: AC
  Administered 2015-05-18: 20 mg via INTRAVENOUS
  Filled 2015-05-18: qty 2

## 2015-05-18 NOTE — Progress Notes (Signed)
[facility]  Daily Progress Note   Subjective: 2 Days Post-Op: Right carotid endarterectomy with CorMatrix arterial patch reconstruction.  Patient seen after CTA of neck. Patient with drop in Hbg to 6.2 this AM. PICC line placed for 2 units PRBC transfusion.   Patient states she "feels better today". Swallowing is "easier". Denies any SOB or chest pain.   Objective: Filed Vitals:   05/18/15 0600 05/18/15 0919 05/18/15 1210 05/18/15 1348  BP: 110/40 148/73 103/56 97/51  Pulse: 60 79 75 73  Temp:   97.5 F (36.4 C) 99.8 F (37.7 C)  TempSrc:   Oral Oral  Resp:      Height:      Weight:      SpO2:  97% 100% 100%    Intake/Output Summary (Last 24 hours) at 05/18/15 1351 Last data filed at 05/18/15 0700  Gross per 24 hour  Intake 1005.65 ml  Output    800 ml  Net 205.65 ml   Physical Exam: A&Ox3, NAD Neck: Incision: clean, dry and intact. Ecchymosis noted tracking across neck. Minimal swelling.  Face: No facial drop. Tongue midline. CV: RRR Pulmonary: CTA Bilaterally PICC line: intact, clean and dry. Abdomen: Soft, Nontender, Nondistended Vascular: Bilateral lower extremities warm, non-tender, minimal edema   Laboratory: CBC    Component Value Date/Time   WBC 7.9 05/18/2015 0337   WBC 8.2 02/20/2015 0948   WBC 8.0 09/01/2013 1134   HGB 6.2* 05/18/2015 0337   HGB 11.6* 09/01/2013 1134   HCT 19.5* 05/18/2015 0337   HCT 30.7* 02/20/2015 0948   HCT 36.1 09/01/2013 1134   PLT 130* 05/18/2015 0337   PLT 256 02/20/2015 0948   PLT 178 09/01/2013 1134   BMET    Component Value Date/Time   NA 139 05/18/2015 0337   NA 141 02/20/2015 0948   NA 143 09/01/2013 1134   K 3.8 05/18/2015 0337   K 3.5 09/01/2013 1134   CL 105 05/18/2015 0337   CL 102 09/01/2013 1134   CO2 28 05/18/2015 0337   CO2 33* 09/01/2013 1134   GLUCOSE 156* 05/18/2015 0337   GLUCOSE 144* 02/20/2015 0948   GLUCOSE 151* 09/01/2013 1134   BUN 13 05/18/2015 0337   BUN 14  02/20/2015 0948   BUN 13 09/01/2013 1134   CREATININE 0.73 05/18/2015 0337   CREATININE 1.06 09/01/2013 1134   CALCIUM 8.1* 05/18/2015 0337   CALCIUM 8.8 09/01/2013 1134   GFRNONAA >60 05/18/2015 0337   GFRNONAA 53* 09/01/2013 1134   GFRAA >60 05/18/2015 0337   GFRAA >60 09/01/2013 1134   Assessment/Planning: 75 year old female s/p Right carotid endarterectomy with CorMatrix arterial patch reconstruction - stable 1) drop in H&H to 6.2 - vitals stable, making urine 2) PICC line placed for two units PRBC - repeat CBC after 3) CTA of neck to assess if hematoma present 4) Continue telemetry 5) Discussed with Dr. Ellis Parents Dabney Schanz PA-C 05/18/2015 1:51 PM

## 2015-05-18 NOTE — Progress Notes (Signed)
Dr. Delana Meyer notified of hgb 6.2. Barbaraann Faster, RN 05/18/2015 7:34 AM

## 2015-05-18 NOTE — Progress Notes (Signed)
Blood transfusion second unit in progress through PICC line

## 2015-05-18 NOTE — Progress Notes (Signed)
PT Cancellation Note  Patient Details Name: Bonnie Henry MRN: [id number] DOB: 1940-02-28   Cancelled Treatment:    Reason Eval/Treat Not Completed: Patient at procedure or test/unavailable Pt with HGB 6.2, getting blood transfusion, PT session deferred for today."   Kreg Shropshire 05/18/2015, 4:05 PM

## 2015-05-18 NOTE — Progress Notes (Signed)
Consent signed by patient for PICC line insertion, [facility] at the bedside to place PICC line. Patient is alert and oriented, no acute distress noted.

## 2015-05-18 NOTE — Progress Notes (Signed)
Spoke to Dr. Delana Meyer about patient IV site and the need for an IV site of 20 gauge or greater. Patient does not have adequate IV access, received orders for a PICC line. Patient agreed to have PICC line inserted. [facility] called by charge nurse

## 2015-05-18 NOTE — Care Management (Signed)
Patient status POD Right carotid endarterectomy .  Patient lives at home with her daughter. Daughter provides transportation.  Patient obtains her medications from [facility] on [address].  Patient states that she has a rolling walker in the home.  Patient is on 2 liters on chronic O2 at home. Portable tank at bedside for discharge.  [facility] but case has not been open.  Patient would like to proceed with referral to [facility] if appropriate at time of discharge.  RNCM following. Abby with [facility] notified.

## 2015-05-18 NOTE — Progress Notes (Signed)
Lopressor held overnight; neck unremarkable, denies SOB or difficulty swallowing, just sore, pain meds effective; Telemetry SR overnight; Hgb 6.2  Barbaraann Faster, RN 6:58 AM 05/18/2015

## 2015-05-18 NOTE — Care Management Important Message (Signed)
Important Message  Patient Details  Name: Bonnie Henry MRN: [id number] Date of Birth: 1940-03-12   Medicare Important Message Given:  Yes    Bonnie Henry 05/18/2015, 1:54 PM

## 2015-05-18 NOTE — Progress Notes (Signed)
Patient back from CT Scan, PICC line in place Blood consent signed from 05-16-2015. Patient prepared for blood transfusion.

## 2015-05-19 LAB — BASIC METABOLIC PANEL
ANION GAP: 6 (ref 5–15)
BUN: 13 mg/dL (ref 6–20)
CHLORIDE: 104 mmol/L (ref 101–111)
CO2: 31 mmol/L (ref 22–32)
CREATININE: 0.81 mg/dL (ref 0.44–1.00)
Calcium: 8.2 mg/dL — ABNORMAL LOW (ref 8.9–10.3)
GFR calc non Af Amer: >60 mL/min (ref >60)
Glucose, Bld: 147 mg/dL — ABNORMAL HIGH (ref 65–99)
POTASSIUM: 3.6 mmol/L (ref 3.5–5.1)
Sodium: 141 mmol/L (ref 135–145)

## 2015-05-19 LAB — TYPE AND SCREEN
ABO/RH(D): A POS
ANTIBODY SCREEN: NEGATIVE
UNIT DIVISION: 0
Unit division: 0

## 2015-05-19 LAB — CBC
HEMATOCRIT: 24.9 % — AB (ref 35.0–47.0)
HEMOGLOBIN: 8.2 g/dL — AB (ref 12.0–16.0)
MCH: 25 pg — AB (ref 26.0–34.0)
MCHC: 32.8 g/dL (ref 32.0–36.0)
MCV: 76.2 fL — AB (ref 80.0–100.0)
PLATELETS: 126 10*3/uL — AB (ref 150–440)
RBC: 3.26 MIL/uL — AB (ref 3.80–5.20)
RDW: 18.2 % — ABNORMAL HIGH (ref 11.5–14.5)
WBC: 9.5 10*3/uL (ref 3.6–11.0)

## 2015-05-19 LAB — MAGNESIUM: Magnesium: 1.7 mg/dL (ref 1.7–2.4)

## 2015-05-19 LAB — GLUCOSE, CAPILLARY: Glucose-Capillary: 133 mg/dL — ABNORMAL HIGH (ref 65–99)

## 2015-05-19 MED ORDER — SODIUM CHLORIDE 0.9 % IV BOLUS (SEPSIS)
250.0000 mL | Freq: Once | INTRAVENOUS | Status: AC
  Administered 2015-05-19: 250 mL via INTRAVENOUS

## 2015-05-19 NOTE — Discharge Instructions (Signed)
You may shower as of Monday.  No driving until you follow up in office. No driving on pain medication.  Please follow up with PCP early next week for blood work and medication check. Your hypertension medications may need to be modified. If you have trouble obtaining an appointment please call our office and we can try to help.

## 2015-05-19 NOTE — Progress Notes (Signed)
[facility]  Daily Progress Note   Subjective: 3 Days Post-Op: Right carotid endarterectomy with CorMatrix arterial patch reconstruction.  Patient sitting in chair without complaint. States no pain. States swallowing in better.   Objective: Filed Vitals:   05/18/15 2023 05/19/15 0557 05/19/15 0630 05/19/15 0909  BP: 130/54 84/24 108/50 145/57  Pulse: 78 66 63 69  Temp: 99.3 F (37.4 C)     TempSrc: Oral     Resp: 20     Height:      Weight:      SpO2: 100%   100%    Intake/Output Summary (Last 24 hours) at 05/19/15 1203 Last data filed at 05/19/15 1100  Gross per 24 hour  Intake 1782.85 ml  Output   2800 ml  Net -1017.15 ml   Physical Exam: A&Ox3, NAD Neck: Incision: clean, dry and intact. Ecchymosis noted tracking across neck however starting to resolve. Minimal swelling.  Face: No facial drop. Tongue midline. CV: RRR Pulmonary: CTA Bilaterally PICC line: intact, clean and dry. Abdomen: Soft, Nontender, Nondistended Vascular: Bilateral lower extremities warm, non-tender, minimal edema   Laboratory: CBC    Component Value Date/Time   WBC 9.5 05/19/2015 0635   WBC 8.2 02/20/2015 0948   WBC 8.0 09/01/2013 1134   HGB 8.2* 05/19/2015 0635   HGB 11.6* 09/01/2013 1134   HCT 24.9* 05/19/2015 0635   HCT 30.7* 02/20/2015 0948   HCT 36.1 09/01/2013 1134   PLT 126* 05/19/2015 0635   PLT 256 02/20/2015 0948   PLT 178 09/01/2013 1134   BMET    Component Value Date/Time   NA 141 05/19/2015 0635   NA 141 02/20/2015 0948   NA 143 09/01/2013 1134   K 3.6 05/19/2015 0635   K 3.5 09/01/2013 1134   CL 104 05/19/2015 0635   CL 102 09/01/2013 1134   CO2 31 05/19/2015 0635   CO2 33* 09/01/2013 1134   GLUCOSE 147* 05/19/2015 0635   GLUCOSE 144* 02/20/2015 0948   GLUCOSE 151* 09/01/2013 1134   BUN 13 05/19/2015 0635   BUN 14 02/20/2015 0948   BUN 13 09/01/2013 1134   CREATININE 0.81 05/19/2015 0635   CREATININE 1.06 09/01/2013 1134   CALCIUM  8.2* 05/19/2015 0635   CALCIUM 8.8 09/01/2013 1134   GFRNONAA >60 05/19/2015 0635   GFRNONAA 53* 09/01/2013 1134   GFRAA >60 05/19/2015 0635   GFRAA >60 09/01/2013 1134   Assessment/Planning: 75 year old female s/p Right carotid endarterectomy with CorMatrix arterial patch reconstruction - stable 1) drop in H&H to 6.2 yesterday - transfused two units PRBC this AM 8.2 - appropriate response. 2) Swallowing improved 3) OK to discharge home 4) Discussed with Dr. Ellis Parents [facility] PA-C 05/19/2015 12:03 PM

## 2015-05-19 NOTE — Care Management Note (Signed)
Case Management Note  Patient Details  Name: LYNNMARIE RAZZANO MRN: [id number] Date of Birth: Aug 07, 1940  Subjective/Objective:    Call to Hezzie Bump PA to request an order for HH=RN. Awaiting call-back.               Action/Plan:   Expected Discharge Date:                  Expected Discharge Plan:     In-House Referral:     Discharge planning Services     Post Acute Care Choice:    Choice offered to:     DME Arranged:    DME Agency:     HH Arranged:    [place] Agency:     Status of Service:     Medicare Important Message Given:  Yes Date Medicare IM Given:    Medicare IM give by:    Date Additional Medicare IM Given:    Additional Medicare Important Message give by:     If discussed at [place] of Stay Meetings, dates discussed:    Additional Comments:  Chaysen Tillman A, RN 05/19/2015, 12:36 PM

## 2015-05-19 NOTE — Care Management Note (Addendum)
Case Management Note  Patient Details  Name: Bonnie Henry MRN: [id number] Date of Birth: Jan 11, 1940  Subjective/Objective:       A referral for home health RN services was called to Cinda Quest at [facility].              Action/Plan:   Expected Discharge Date:                  Expected Discharge Plan:     In-House Referral:     Discharge planning Services     Post Acute Care Choice:    Choice offered to:     DME Arranged:    DME Agency:     HH Arranged:    [place] Agency:     Status of Service:     Medicare Important Message Given:  Yes Date Medicare IM Given:    Medicare IM give by:    Date Additional Medicare IM Given:    Additional Medicare Important Message give by:     If discussed at [place] of Stay Meetings, dates discussed:    Additional Comments:  Ieisha Gao A, RN 05/19/2015, 12:52 PM

## 2015-05-22 DIAGNOSIS — I739 Peripheral vascular disease, unspecified: Secondary | ICD-10-CM | POA: Diagnosis not present

## 2015-05-22 DIAGNOSIS — M6281 Muscle weakness (generalized): Secondary | ICD-10-CM | POA: Diagnosis not present

## 2015-05-22 DIAGNOSIS — F419 Anxiety disorder, unspecified: Secondary | ICD-10-CM | POA: Diagnosis not present

## 2015-05-22 DIAGNOSIS — I110 Hypertensive heart disease with heart failure: Secondary | ICD-10-CM | POA: Diagnosis not present

## 2015-05-22 DIAGNOSIS — I509 Heart failure, unspecified: Secondary | ICD-10-CM | POA: Diagnosis not present

## 2015-05-22 DIAGNOSIS — I25119 Atherosclerotic heart disease of native coronary artery with unspecified angina pectoris: Secondary | ICD-10-CM | POA: Diagnosis not present

## 2015-05-22 DIAGNOSIS — Z9981 Dependence on supplemental oxygen: Secondary | ICD-10-CM | POA: Diagnosis not present

## 2015-05-22 DIAGNOSIS — I4891 Unspecified atrial fibrillation: Secondary | ICD-10-CM | POA: Diagnosis not present

## 2015-05-22 DIAGNOSIS — Z9012 Acquired absence of left breast and nipple: Secondary | ICD-10-CM | POA: Diagnosis not present

## 2015-05-22 DIAGNOSIS — J449 Chronic obstructive pulmonary disease, unspecified: Secondary | ICD-10-CM | POA: Diagnosis not present

## 2015-05-22 DIAGNOSIS — I252 Old myocardial infarction: Secondary | ICD-10-CM | POA: Diagnosis not present

## 2015-05-22 DIAGNOSIS — Z48812 Encounter for surgical aftercare following surgery on the circulatory system: Secondary | ICD-10-CM | POA: Diagnosis not present

## 2015-05-22 DIAGNOSIS — Z87891 Personal history of nicotine dependence: Secondary | ICD-10-CM | POA: Diagnosis not present

## 2015-05-22 DIAGNOSIS — Z853 Personal history of malignant neoplasm of breast: Secondary | ICD-10-CM | POA: Diagnosis not present

## 2015-05-22 DIAGNOSIS — Z7982 Long term (current) use of aspirin: Secondary | ICD-10-CM | POA: Diagnosis not present

## 2015-05-22 DIAGNOSIS — F329 Major depressive disorder, single episode, unspecified: Secondary | ICD-10-CM | POA: Diagnosis not present

## 2015-05-22 DIAGNOSIS — S50811D Abrasion of right forearm, subsequent encounter: Secondary | ICD-10-CM | POA: Diagnosis not present

## 2015-05-22 DIAGNOSIS — R296 Repeated falls: Secondary | ICD-10-CM | POA: Diagnosis not present

## 2015-05-22 DIAGNOSIS — Z794 Long term (current) use of insulin: Secondary | ICD-10-CM | POA: Diagnosis not present

## 2015-05-22 DIAGNOSIS — E119 Type 2 diabetes mellitus without complications: Secondary | ICD-10-CM | POA: Diagnosis not present

## 2015-05-23 ENCOUNTER — Ambulatory Visit
Admission: RE | Admit: 2015-05-23 | Discharge: 2015-05-23 | Disposition: A | Discharge status: Home / Self Care | Payer: Medicare Other | Source: Ambulatory Visit | Attending: Specialist | Admitting: Specialist

## 2015-05-23 DIAGNOSIS — I2510 Atherosclerotic heart disease of native coronary artery without angina pectoris: Secondary | ICD-10-CM | POA: Diagnosis not present

## 2015-05-23 DIAGNOSIS — R918 Other nonspecific abnormal finding of lung field: Secondary | ICD-10-CM | POA: Insufficient documentation

## 2015-05-23 DIAGNOSIS — R911 Solitary pulmonary nodule: Secondary | ICD-10-CM

## 2015-05-25 DIAGNOSIS — I509 Heart failure, unspecified: Secondary | ICD-10-CM | POA: Diagnosis not present

## 2015-05-25 DIAGNOSIS — Z48812 Encounter for surgical aftercare following surgery on the circulatory system: Secondary | ICD-10-CM | POA: Diagnosis not present

## 2015-05-25 DIAGNOSIS — E119 Type 2 diabetes mellitus without complications: Secondary | ICD-10-CM | POA: Diagnosis not present

## 2015-05-25 DIAGNOSIS — R296 Repeated falls: Secondary | ICD-10-CM | POA: Diagnosis not present

## 2015-05-25 DIAGNOSIS — S50811D Abrasion of right forearm, subsequent encounter: Secondary | ICD-10-CM | POA: Diagnosis not present

## 2015-05-25 DIAGNOSIS — I110 Hypertensive heart disease with heart failure: Secondary | ICD-10-CM | POA: Diagnosis not present

## 2015-05-28 DIAGNOSIS — I509 Heart failure, unspecified: Secondary | ICD-10-CM | POA: Diagnosis not present

## 2015-05-28 DIAGNOSIS — Z48812 Encounter for surgical aftercare following surgery on the circulatory system: Secondary | ICD-10-CM | POA: Diagnosis not present

## 2015-05-28 DIAGNOSIS — R296 Repeated falls: Secondary | ICD-10-CM | POA: Diagnosis not present

## 2015-05-28 DIAGNOSIS — I110 Hypertensive heart disease with heart failure: Secondary | ICD-10-CM | POA: Diagnosis not present

## 2015-05-28 DIAGNOSIS — S50811D Abrasion of right forearm, subsequent encounter: Secondary | ICD-10-CM | POA: Diagnosis not present

## 2015-05-28 DIAGNOSIS — E119 Type 2 diabetes mellitus without complications: Secondary | ICD-10-CM | POA: Diagnosis not present

## 2015-05-29 DIAGNOSIS — I509 Heart failure, unspecified: Secondary | ICD-10-CM | POA: Diagnosis not present

## 2015-05-29 DIAGNOSIS — R296 Repeated falls: Secondary | ICD-10-CM | POA: Diagnosis not present

## 2015-05-29 DIAGNOSIS — Z48812 Encounter for surgical aftercare following surgery on the circulatory system: Secondary | ICD-10-CM | POA: Diagnosis not present

## 2015-05-29 DIAGNOSIS — E119 Type 2 diabetes mellitus without complications: Secondary | ICD-10-CM | POA: Diagnosis not present

## 2015-05-29 DIAGNOSIS — I110 Hypertensive heart disease with heart failure: Secondary | ICD-10-CM | POA: Diagnosis not present

## 2015-05-29 DIAGNOSIS — S50811D Abrasion of right forearm, subsequent encounter: Secondary | ICD-10-CM | POA: Diagnosis not present

## 2015-05-31 ENCOUNTER — Telehealth: Payer: Self-pay | Admitting: Family Medicine

## 2015-05-31 DIAGNOSIS — S50811D Abrasion of right forearm, subsequent encounter: Secondary | ICD-10-CM | POA: Diagnosis not present

## 2015-05-31 DIAGNOSIS — Z48812 Encounter for surgical aftercare following surgery on the circulatory system: Secondary | ICD-10-CM | POA: Diagnosis not present

## 2015-05-31 DIAGNOSIS — R296 Repeated falls: Secondary | ICD-10-CM | POA: Diagnosis not present

## 2015-05-31 DIAGNOSIS — E119 Type 2 diabetes mellitus without complications: Secondary | ICD-10-CM | POA: Diagnosis not present

## 2015-05-31 DIAGNOSIS — I110 Hypertensive heart disease with heart failure: Secondary | ICD-10-CM | POA: Diagnosis not present

## 2015-05-31 DIAGNOSIS — I509 Heart failure, unspecified: Secondary | ICD-10-CM | POA: Diagnosis not present

## 2015-05-31 NOTE — Telephone Encounter (Signed)
Called patient to schedule Neuropysch appts with Dr. Si Raider. Patient refused appt & testing at this time.

## 2015-06-01 DIAGNOSIS — I509 Heart failure, unspecified: Secondary | ICD-10-CM | POA: Diagnosis not present

## 2015-06-01 DIAGNOSIS — S50811D Abrasion of right forearm, subsequent encounter: Secondary | ICD-10-CM | POA: Diagnosis not present

## 2015-06-01 DIAGNOSIS — R296 Repeated falls: Secondary | ICD-10-CM | POA: Diagnosis not present

## 2015-06-01 DIAGNOSIS — E119 Type 2 diabetes mellitus without complications: Secondary | ICD-10-CM | POA: Diagnosis not present

## 2015-06-01 DIAGNOSIS — I110 Hypertensive heart disease with heart failure: Secondary | ICD-10-CM | POA: Diagnosis not present

## 2015-06-01 DIAGNOSIS — Z48812 Encounter for surgical aftercare following surgery on the circulatory system: Secondary | ICD-10-CM | POA: Diagnosis not present

## 2015-06-03 NOTE — Assessment & Plan Note (Signed)
Managed by neurologist

## 2015-06-03 NOTE — Assessment & Plan Note (Signed)
Encouraged dist lower in saturated fats; see AVS

## 2015-06-03 NOTE — Assessment & Plan Note (Signed)
Noted on recent MRI; seen by neurologist

## 2015-06-03 NOTE — Assessment & Plan Note (Signed)
Managed by endo

## 2015-06-05 DIAGNOSIS — I509 Heart failure, unspecified: Secondary | ICD-10-CM | POA: Diagnosis not present

## 2015-06-05 DIAGNOSIS — S50811D Abrasion of right forearm, subsequent encounter: Secondary | ICD-10-CM | POA: Diagnosis not present

## 2015-06-05 DIAGNOSIS — R296 Repeated falls: Secondary | ICD-10-CM | POA: Diagnosis not present

## 2015-06-05 DIAGNOSIS — Z48812 Encounter for surgical aftercare following surgery on the circulatory system: Secondary | ICD-10-CM | POA: Diagnosis not present

## 2015-06-05 DIAGNOSIS — I110 Hypertensive heart disease with heart failure: Secondary | ICD-10-CM | POA: Diagnosis not present

## 2015-06-05 DIAGNOSIS — E119 Type 2 diabetes mellitus without complications: Secondary | ICD-10-CM | POA: Diagnosis not present

## 2015-06-08 ENCOUNTER — Ambulatory Visit (INDEPENDENT_AMBULATORY_CARE_PROVIDER_SITE_OTHER): Payer: Medicare Other | Admitting: Family Medicine

## 2015-06-08 ENCOUNTER — Encounter: Payer: Self-pay | Admitting: Family Medicine

## 2015-06-08 VITALS — BP 132/78 | HR 85 | Temp 98.8°F | Resp 18 | Ht 60.0 in | Wt 193.5 lb

## 2015-06-08 DIAGNOSIS — K219 Gastro-esophageal reflux disease without esophagitis: Secondary | ICD-10-CM

## 2015-06-08 DIAGNOSIS — Z9889 Other specified postprocedural states: Secondary | ICD-10-CM | POA: Diagnosis not present

## 2015-06-08 DIAGNOSIS — I6523 Occlusion and stenosis of bilateral carotid arteries: Secondary | ICD-10-CM | POA: Diagnosis not present

## 2015-06-08 DIAGNOSIS — Z794 Long term (current) use of insulin: Secondary | ICD-10-CM | POA: Diagnosis not present

## 2015-06-08 DIAGNOSIS — E1140 Type 2 diabetes mellitus with diabetic neuropathy, unspecified: Secondary | ICD-10-CM | POA: Diagnosis not present

## 2015-06-08 DIAGNOSIS — IMO0002 Reserved for concepts with insufficient information to code with codable children: Secondary | ICD-10-CM

## 2015-06-08 DIAGNOSIS — E7800 Pure hypercholesterolemia, unspecified: Secondary | ICD-10-CM

## 2015-06-08 DIAGNOSIS — E1165 Type 2 diabetes mellitus with hyperglycemia: Secondary | ICD-10-CM | POA: Diagnosis not present

## 2015-06-08 DIAGNOSIS — D6489 Other specified anemias: Secondary | ICD-10-CM | POA: Diagnosis not present

## 2015-06-08 LAB — POCT GLYCOSYLATED HEMOGLOBIN (HGB A1C): Hemoglobin A1C: 6.8

## 2015-06-08 LAB — POCT UA - MICROALBUMIN: MICROALBUMIN (UR) POC: 50 mg/L

## 2015-06-08 LAB — GLUCOSE, POCT (MANUAL RESULT ENTRY): POC GLUCOSE: 167 mg/dL — AB (ref 70–99)

## 2015-06-08 MED ORDER — PANTOPRAZOLE SODIUM 20 MG PO TBEC: 20.0000 mg | DELAYED_RELEASE_TABLET | Freq: Every day | ORAL | Status: DC

## 2015-06-08 MED ORDER — CLOPIDOGREL BISULFATE 75 MG PO TABS: 75.0000 mg | ORAL_TABLET | Freq: Every day | ORAL | Status: DC

## 2015-06-08 MED ORDER — RANITIDINE HCL 300 MG PO TABS: 300.0000 mg | ORAL_TABLET | Freq: Every day | ORAL | Status: DC

## 2015-06-08 NOTE — Assessment & Plan Note (Addendum)
We discussed risks of PPI use long-term; will decrease PPI to once a day and change to pantoprazole instead of omeprazole because of addition of Plavix, and add 300 mg ranitidine; I would like for her to gradually cut out a pill of the PPI now and then, maybe skip a pill once a week, then skip two pills each week, etc.

## 2015-06-08 NOTE — Assessment & Plan Note (Addendum)
Check CBC today along with ferritin; she required transfusion post-operatively after CEA in May; she is also on long-term PPI which is likely interferring with her ability to absorb iron

## 2015-06-08 NOTE — Patient Instructions (Addendum)
Try to limit saturated fats in your diet (bologna, hot dogs, barbeque, cheeseburgers, hamburgers, steak, bacon, sausage, cheese, etc.) and get more fresh fruits, vegetables, and whole grains  Keep your f/u with Dr. Ronalee Belts  Start plavix 75 mg daily for 90 days  Have labs done today  STOP the omeprazole  START pantoprazole just once a day; use it less and less over the coming weeks and month, taper down gradually START ranitidine 300 mg daily at bedtime to help with reflux  Return on or after August 15th fasting for labs and visit

## 2015-06-08 NOTE — Progress Notes (Signed)
BP 132/78 mmHg  Pulse 85  Temp(Src) 98.8 F (37.1 C)  Resp 18  Ht 5' (1.524 m)  Wt 193 lb 8 oz (87.771 kg)  BMI 37.79 kg/m2  SpO2 96%   Subjective:    Patient ID: Bonnie Henry, female    DOB: 11-17-1940, 75 y.o.   MRN: [id number]  HPI: Bonnie Henry is a 75 y.o. female  Chief Complaint  Patient presents with  . Diabetes  MD note: she is not actually here for diabetes as her chief complaint; she is here for hospital f/u  She is here for follow-up after carotid endarterectomy; on the right side; May 10th, Dr. Ronalee Belts vascular surgeon She has not been able to see the vascular surgeon since the surgery, so this is her first f/u visit She has not had any labs since leaving the hospital No fevers since then Continues to have headaches, nothing new Has tenderness along the jaw, chin on the right side, since surgery; no trouble swallowing; no trouble speaking No amaurosis fugax No double vision She has had some weakness of the left leg, not new since surgery; artificial knee on the left --------------------------------------------- Admit date: 05/16/2015 Discharge date: 05/19/2015 Date of Surgery: 05/16/2015 Surgeon: Surgeon(s): Katha Cabal, MD  Admission Diagnosis: CAROTID ARTERY STENOSIS  Discharge Diagnoses:  CAROTID ARTERY STENOSIS ---------------------------------------------- Hgb dropped to 6.2, got 2 units of PRBCs and came up to 9.4; worked with PT for weakness during stay; discharge Hgb 8.2  High cholesterol; on atorvastatin; taking it every night; no problems No bacon unless [place] bacon, just every now and then; does eat sausage but is ready to give that up Drinks 2% milk but would be willing to reduce fat %; not drinking much; does eat cheese maybe 3 times a week  Lab Results  Component Value Date   CHOL 173 02/20/2015   CHOL 155 06/26/2014   CHOL 166 05/21/2014   Lab Results  Component Value Date   HDL 63 02/20/2015   HDL 64  06/26/2014   HDL 70 05/21/2014   Lab Results  Component Value Date   LDLCALC 73 02/20/2015   [place] 65 06/26/2014   [place] 71 05/21/2014   Lab Results  Component Value Date   TRIG 184* 02/20/2015   TRIG 130 06/26/2014   TRIG 123 05/21/2014   Lab Results  Component Value Date   CHOLHDL 2.4 05/21/2014   No results found for: LDLDIRECT  Diabetes; a1c was 6.8, checked by staff; previous A1c at [facility] was 7.7 on Nov 09, 2014; not much bread; she sees Dr. Graceann Congress for her diabetes (endocrinologist)  Still having GERD; having acid reflux; bed is elevated; she is eating before bed sometimes; on PPI BID but daughter rightly worried that's contributing to anemia  Depression screen [facility] 2/9 06/08/2015 05/04/2015 01/22/2015  Decreased Interest 0 0 3  Down, Depressed, Hopeless 0 0 0  PHQ - 2 Score 0 0 3  Altered sleeping - - 3  Tired, decreased energy - - 1  Change in appetite - - 0  Feeling bad or failure about yourself  - - 0  Trouble concentrating - - 0  Moving slowly or fidgety/restless - - 0  Suicidal thoughts - - 0  PHQ-9 Score - - 7  Difficult doing work/chores - - Somewhat difficult   Relevant past medical, surgical, family and social history reviewed Past Medical History  Diagnosis Date  . Diabetes mellitus without complication ([place])   . Atrial fibrillation ([place])   .  Hypertension   . GERD (gastroesophageal reflux disease)   . CAD (coronary artery disease)   . Hypercholesteremia   . COPD (chronic obstructive pulmonary disease) ([place])   . Morbid obesity ([place])   . Post-surgical hypothyroidism   . Depression, major ([place])   . Neuropathy ([place])   . Asthma   . Sleep apnea     "2015 wore mask; lost weight; told didn't need mask anymore" (02/23/2014)  . Type II diabetes mellitus ([place])   . Anxiety   . Anemia   . History of blood transfusion     "when I had my mastectomy"  . Chronic back pain   . Breast cancer, left breast (HCC)     S/P mastectomy  . Myocardial infarction  ([place])   . Shortness of breath dyspnea   . Heart murmur   . Headache   . History of hiatal hernia   . Constipation 03/04/2015  . CHF (congestive heart failure) ([place])   . Peripheral vascular disease ([place])   . Hepatitis     HEP "C"  . Arthritis     "joints ache all over" . osteoarthritis  . DVT (deep venous thrombosis) ([place])   . Restless leg syndrome   . History of right-sided carotid endarterectomy 06/10/2015   Past Surgical History  Procedure Laterality Date  . Bartholin cyst marsupialization    . Trigger finger release Left   . Cataract extraction w/ intraocular lens  implant, bilateral Bilateral ~ 2011  . Total knee arthroplasty Left 1990's  . Total hip arthroplasty Bilateral 1990's  . Thyroidectomy, partial  1950's  . Shoulder surgery Right     "cleaned it out; no scope"  . Toe surgery Right     "cut piece of bone out so toe didn't dig into my foot"  . Carotid endarterectomy Left 2009  . Hammer toe surgery Left     2nd and 3rd digits  . Tonsillectomy  1950's  . Mastectomy Left 1990's  . Tubal ligation    . Vaginal hysterectomy      "partial"  . Coronary angioplasty with stent placement  1990's X 2    "2; 1"  . Cardiac catheterization  2000's  . Left heart catheterization with coronary angiogram N/A 02/24/2014    Procedure: LEFT HEART CATHETERIZATION WITH CORONARY ANGIOGRAM;  Surgeon: Leonie Man, MD;  Location: [facility] C[facility] LAB;  Service: Cardiovascular;  Laterality: N/A;  . Artery biopsy Right 09/29/2014    Procedure: BIOPSY TEMPORAL ARTERY;  Surgeon: Angelia Mould, MD;  Location: [facility] OR;  Service: Vascular;  Laterality: Right;  . Esophagogastroduodenoscopy (egd) with propofol N/A 12/12/2014    Procedure: ESOPHAGOGASTRODUODENOSCOPY (EGD) WITH PROPOFOL;  Surgeon: Lucilla Lame, MD;  Location: [facility] ENDOSCOPY;  Service: Endoscopy;  Laterality: N/A;  . Breast biopsy Left     Mastectomy  . Eye surgery Bilateral     Catract Extraction with IOL  . Joint replacement  Bilateral     Total Hip Replacement  . Joint replacement Left     Total Knee Replacement  . Endarterectomy Right 05/16/2015    Procedure: ENDARTERECTOMY CAROTID;  Surgeon: Katha Cabal, MD;  Location: [facility] ORS;  Service: Vascular;  Laterality: Right;   Family History  Problem Relation Age of Onset  . Arthritis Mother   . Cancer Mother   . Diabetes Mother   . Heart disease Mother   . Hyperlipidemia Mother   . Hypertension Mother   . Alcohol abuse Father   . Alcohol abuse Brother   .  Arthritis Brother   . Asthma Brother   . HIV Brother   . Cancer Brother   . Diabetes Brother   . Hyperlipidemia Brother   . Hypertension Brother   . Lung disease Brother   . Arthritis Maternal Grandmother   . Brain cancer Father    Social History  Substance Use Topics  . Smoking status: Former Smoker -- 1.50 packs/day for 44 years    Types: Cigarettes    Quit date: 01/06/1994  . Smokeless tobacco: Never Used     Comment: "quit smoking cigarettes in ~ 2000"  . Alcohol Use: No     Comment: 02/23/2014 "no alcohol since 1990's"   Interim medical history since last visit reviewed. Allergies and medications reviewed  Review of Systems Per HPI unless specifically indicated above     Objective:    BP 132/78 mmHg  Pulse 85  Temp(Src) 98.8 F (37.1 C)  Resp 18  Ht 5' (1.524 m)  Wt 193 lb 8 oz (87.771 kg)  BMI 37.79 kg/m2  SpO2 96%  Wt Readings from Last 3 Encounters:  06/08/15 193 lb 8 oz (87.771 kg)  05/16/15 205 lb 4 oz (93.1 kg)  05/08/15 196 lb (88.905 kg)    Physical Exam  Constitutional: She appears well-developed and well-nourished.  Elderly, appears chronically ill; deconditioned, older than stated age; nontoxic; seated in wheelchair; gait not assessed  Neck: No JVD present. Carotid bruit is not present.  Surgical incision site C/D/I; no hematoma  Cardiovascular: Normal rate and regular rhythm.   Pulmonary/Chest: Effort normal and breath sounds normal.  Abdominal: Soft.  She exhibits no distension.  obese  Neurological:  Has walker today  Skin: Ecchymosis (sun-exposed portion of arms, few scattered purplish ecchymoses) noted. No petechiae noted. No pallor.  Skin tear; few ecchymoses  Psychiatric: She has a normal mood and affect. Her affect is not blunt. She does not exhibit a depressed mood.  Good eye contact with examiner; somewhat curt, short-tempered; for example, examiner apologized for being late, running behind, told her "I'm trying the best that I can," and she said "Try harder"      Assessment & Plan:   Problem List Items Addressed This Visit      Digestive   Acid reflux    We discussed risks of PPI use long-term; will decrease PPI to once a day and change to pantoprazole instead of omeprazole because of addition of Plavix, and add 300 mg ranitidine; I would like for her to gradually cut out a pill of the PPI now and then, maybe skip a pill once a week, then skip two pills each week, etc.      Relevant Medications   pantoprazole (PROTONIX) 20 MG tablet   ranitidine (ZANTAC) 300 MG tablet     Endocrine   Diabetes mellitus with diabetic neuropathy ([place])    Managed by Dr. Graceann Congress, endocrinologist; my staff got an A1c before I entered the room; 6.8 showing good control        Other   Anemia - Primary    Check CBC today along with ferritin; she required transfusion post-operatively after CEA in May; she is also on long-term PPI which is likely interferring with her ability to absorb iron      Relevant Orders   CBC with Differential/Platelet (Completed)   Ferritin (Completed)   High cholesterol (Chronic)    Goal LDL now under 70; continue statin, weight loss would be helpful      History of right-sided  carotid endarterectomy    I read through discharge summary; patient is not on Plavix, and she has not been able to see her surgeon since leaving the hospital; I personally called vascular surgeon's office and appreciate speaking with Dr.  Lucky Cowboy; he agreed that patient would be best served to be on Plavix for 90 days, and believed that her Plavix was initially held because of a hematoma; she is doing well now, will start Plavix; change the PPI to pantoprazole       Other Visit Diagnoses    Type 2 diabetes, uncontrolled, with neuropathy ([place])        Relevant Orders    POCT HgB A1C (Completed)    POCT Glucose (CBG) (Completed)    POCT UA - Microalbumin (Completed)       Follow up plan: Return in about 2 months (around 08/21/2015), or or just after, for fasting labs and visit.  An after-visit summary was printed and given to the patient at [place].  Please see the patient instructions which may contain other information and recommendations beyond what is mentioned above in the assessment and plan.  Meds ordered this encounter  Medications  . pantoprazole (PROTONIX) 20 MG tablet    Sig: Take 1 tablet (20 mg total) by mouth daily. This replaces omeprazole    Dispense:  30 tablet    Refill:  2    STOP omeprazole  . clopidogrel (PLAVIX) 75 MG tablet    Sig: Take 1 tablet (75 mg total) by mouth daily.    Dispense:  90 tablet    Refill:  0  . ranitidine (ZANTAC) 300 MG tablet    Sig: Take 1 tablet (300 mg total) by mouth at bedtime.    Dispense:  30 tablet    Refill:  2    Orders Placed This Encounter  Procedures  . CBC with Differential/Platelet  . Ferritin  . POCT HgB A1C  . POCT Glucose (CBG)  . POCT UA - Microalbumin

## 2015-06-09 ENCOUNTER — Other Ambulatory Visit: Payer: Self-pay | Admitting: Family Medicine

## 2015-06-09 LAB — CBC WITH DIFFERENTIAL/PLATELET
BASOS: 0 %
Basophils Absolute: 0 10*3/uL (ref 0.0–0.2)
EOS (ABSOLUTE): 0.2 10*3/uL (ref 0.0–0.4)
Eos: 3 %
HEMOGLOBIN: 10.5 g/dL — AB (ref 11.1–15.9)
Hematocrit: 32.9 % — ABNORMAL LOW (ref 34.0–46.6)
IMMATURE GRANS (ABS): 0 10*3/uL (ref 0.0–0.1)
IMMATURE GRANULOCYTES: 0 %
LYMPHS: 25 %
Lymphocytes Absolute: 2 10*3/uL (ref 0.7–3.1)
MCH: 25.5 pg — AB (ref 26.6–33.0)
MCHC: 31.9 g/dL (ref 31.5–35.7)
MCV: 80 fL (ref 79–97)
MONOCYTES: 8 %
Monocytes Absolute: 0.7 10*3/uL (ref 0.1–0.9)
NEUTROS ABS: 5 10*3/uL (ref 1.4–7.0)
NEUTROS PCT: 64 %
PLATELETS: 221 10*3/uL (ref 150–379)
RBC: 4.12 x10E6/uL (ref 3.77–5.28)
RDW: 19.3 % — ABNORMAL HIGH (ref 12.3–15.4)
WBC: 8 10*3/uL (ref 3.4–10.8)

## 2015-06-09 LAB — FERRITIN: FERRITIN: 68 ng/mL (ref 15–150)

## 2015-06-10 ENCOUNTER — Encounter: Payer: Self-pay | Admitting: Family Medicine

## 2015-06-10 DIAGNOSIS — Z9889 Other specified postprocedural states: Secondary | ICD-10-CM | POA: Insufficient documentation

## 2015-06-10 HISTORY — DX: Other specified postprocedural states: Z98.890

## 2015-06-10 NOTE — Assessment & Plan Note (Addendum)
Goal LDL now under 70; continue statin, weight loss would be helpful

## 2015-06-10 NOTE — Assessment & Plan Note (Signed)
Managed by Dr. Graceann Congress, endocrinologist; my staff got an A1c before I entered the room; 6.8 showing good control

## 2015-06-10 NOTE — Assessment & Plan Note (Signed)
I read through discharge summary; patient is not on Plavix, and she has not been able to see her surgeon since leaving the hospital; I personally called vascular surgeon's office and appreciate speaking with Dr. Lucky Cowboy; he agreed that patient would be best served to be on Plavix for 90 days, and believed that her Plavix was initially held because of a hematoma; she is doing well now, will start Plavix; change the PPI to pantoprazole

## 2015-06-11 DIAGNOSIS — R296 Repeated falls: Secondary | ICD-10-CM | POA: Diagnosis not present

## 2015-06-11 DIAGNOSIS — Z48812 Encounter for surgical aftercare following surgery on the circulatory system: Secondary | ICD-10-CM | POA: Diagnosis not present

## 2015-06-11 DIAGNOSIS — I509 Heart failure, unspecified: Secondary | ICD-10-CM | POA: Diagnosis not present

## 2015-06-11 DIAGNOSIS — I110 Hypertensive heart disease with heart failure: Secondary | ICD-10-CM | POA: Diagnosis not present

## 2015-06-11 DIAGNOSIS — S50811D Abrasion of right forearm, subsequent encounter: Secondary | ICD-10-CM | POA: Diagnosis not present

## 2015-06-11 DIAGNOSIS — E119 Type 2 diabetes mellitus without complications: Secondary | ICD-10-CM | POA: Diagnosis not present

## 2015-06-12 DIAGNOSIS — I110 Hypertensive heart disease with heart failure: Secondary | ICD-10-CM | POA: Diagnosis not present

## 2015-06-12 DIAGNOSIS — I509 Heart failure, unspecified: Secondary | ICD-10-CM | POA: Diagnosis not present

## 2015-06-12 DIAGNOSIS — S50811D Abrasion of right forearm, subsequent encounter: Secondary | ICD-10-CM | POA: Diagnosis not present

## 2015-06-12 DIAGNOSIS — E119 Type 2 diabetes mellitus without complications: Secondary | ICD-10-CM | POA: Diagnosis not present

## 2015-06-12 DIAGNOSIS — Z48812 Encounter for surgical aftercare following surgery on the circulatory system: Secondary | ICD-10-CM | POA: Diagnosis not present

## 2015-06-12 DIAGNOSIS — R296 Repeated falls: Secondary | ICD-10-CM | POA: Diagnosis not present

## 2015-06-14 DIAGNOSIS — Z794 Long term (current) use of insulin: Secondary | ICD-10-CM | POA: Diagnosis not present

## 2015-06-14 DIAGNOSIS — E1165 Type 2 diabetes mellitus with hyperglycemia: Secondary | ICD-10-CM | POA: Diagnosis not present

## 2015-06-14 DIAGNOSIS — E039 Hypothyroidism, unspecified: Secondary | ICD-10-CM | POA: Diagnosis not present

## 2015-06-14 DIAGNOSIS — E1142 Type 2 diabetes mellitus with diabetic polyneuropathy: Secondary | ICD-10-CM | POA: Diagnosis not present

## 2015-06-14 DIAGNOSIS — E1140 Type 2 diabetes mellitus with diabetic neuropathy, unspecified: Secondary | ICD-10-CM | POA: Diagnosis not present

## 2015-06-15 DIAGNOSIS — E119 Type 2 diabetes mellitus without complications: Secondary | ICD-10-CM | POA: Diagnosis not present

## 2015-06-15 DIAGNOSIS — S50811D Abrasion of right forearm, subsequent encounter: Secondary | ICD-10-CM | POA: Diagnosis not present

## 2015-06-15 DIAGNOSIS — I110 Hypertensive heart disease with heart failure: Secondary | ICD-10-CM | POA: Diagnosis not present

## 2015-06-15 DIAGNOSIS — R296 Repeated falls: Secondary | ICD-10-CM | POA: Diagnosis not present

## 2015-06-15 DIAGNOSIS — I509 Heart failure, unspecified: Secondary | ICD-10-CM | POA: Diagnosis not present

## 2015-06-15 DIAGNOSIS — Z48812 Encounter for surgical aftercare following surgery on the circulatory system: Secondary | ICD-10-CM | POA: Diagnosis not present

## 2015-06-18 DIAGNOSIS — Z48812 Encounter for surgical aftercare following surgery on the circulatory system: Secondary | ICD-10-CM | POA: Diagnosis not present

## 2015-06-18 DIAGNOSIS — S50811D Abrasion of right forearm, subsequent encounter: Secondary | ICD-10-CM | POA: Diagnosis not present

## 2015-06-18 DIAGNOSIS — R296 Repeated falls: Secondary | ICD-10-CM | POA: Diagnosis not present

## 2015-06-18 DIAGNOSIS — E119 Type 2 diabetes mellitus without complications: Secondary | ICD-10-CM | POA: Diagnosis not present

## 2015-06-18 DIAGNOSIS — I110 Hypertensive heart disease with heart failure: Secondary | ICD-10-CM | POA: Diagnosis not present

## 2015-06-18 DIAGNOSIS — I509 Heart failure, unspecified: Secondary | ICD-10-CM | POA: Diagnosis not present

## 2015-06-21 DIAGNOSIS — E119 Type 2 diabetes mellitus without complications: Secondary | ICD-10-CM | POA: Diagnosis not present

## 2015-06-21 DIAGNOSIS — S50811D Abrasion of right forearm, subsequent encounter: Secondary | ICD-10-CM | POA: Diagnosis not present

## 2015-06-21 DIAGNOSIS — I509 Heart failure, unspecified: Secondary | ICD-10-CM | POA: Diagnosis not present

## 2015-06-21 DIAGNOSIS — R296 Repeated falls: Secondary | ICD-10-CM | POA: Diagnosis not present

## 2015-06-21 DIAGNOSIS — I110 Hypertensive heart disease with heart failure: Secondary | ICD-10-CM | POA: Diagnosis not present

## 2015-06-21 DIAGNOSIS — Z48812 Encounter for surgical aftercare following surgery on the circulatory system: Secondary | ICD-10-CM | POA: Diagnosis not present

## 2015-06-26 DIAGNOSIS — I509 Heart failure, unspecified: Secondary | ICD-10-CM | POA: Diagnosis not present

## 2015-06-26 DIAGNOSIS — S50811D Abrasion of right forearm, subsequent encounter: Secondary | ICD-10-CM | POA: Diagnosis not present

## 2015-06-26 DIAGNOSIS — R296 Repeated falls: Secondary | ICD-10-CM | POA: Diagnosis not present

## 2015-06-26 DIAGNOSIS — I110 Hypertensive heart disease with heart failure: Secondary | ICD-10-CM | POA: Diagnosis not present

## 2015-06-26 DIAGNOSIS — E119 Type 2 diabetes mellitus without complications: Secondary | ICD-10-CM | POA: Diagnosis not present

## 2015-06-26 DIAGNOSIS — Z48812 Encounter for surgical aftercare following surgery on the circulatory system: Secondary | ICD-10-CM | POA: Diagnosis not present

## 2015-07-04 DIAGNOSIS — I509 Heart failure, unspecified: Secondary | ICD-10-CM | POA: Diagnosis not present

## 2015-07-04 DIAGNOSIS — Z48812 Encounter for surgical aftercare following surgery on the circulatory system: Secondary | ICD-10-CM | POA: Diagnosis not present

## 2015-07-04 DIAGNOSIS — S50811D Abrasion of right forearm, subsequent encounter: Secondary | ICD-10-CM | POA: Diagnosis not present

## 2015-07-04 DIAGNOSIS — I110 Hypertensive heart disease with heart failure: Secondary | ICD-10-CM | POA: Diagnosis not present

## 2015-07-04 DIAGNOSIS — E119 Type 2 diabetes mellitus without complications: Secondary | ICD-10-CM | POA: Diagnosis not present

## 2015-07-04 DIAGNOSIS — R296 Repeated falls: Secondary | ICD-10-CM | POA: Diagnosis not present

## 2015-07-06 DIAGNOSIS — S50811D Abrasion of right forearm, subsequent encounter: Secondary | ICD-10-CM | POA: Diagnosis not present

## 2015-07-06 DIAGNOSIS — I110 Hypertensive heart disease with heart failure: Secondary | ICD-10-CM | POA: Diagnosis not present

## 2015-07-06 DIAGNOSIS — E119 Type 2 diabetes mellitus without complications: Secondary | ICD-10-CM | POA: Diagnosis not present

## 2015-07-06 DIAGNOSIS — R296 Repeated falls: Secondary | ICD-10-CM | POA: Diagnosis not present

## 2015-07-06 DIAGNOSIS — I509 Heart failure, unspecified: Secondary | ICD-10-CM | POA: Diagnosis not present

## 2015-07-06 DIAGNOSIS — Z48812 Encounter for surgical aftercare following surgery on the circulatory system: Secondary | ICD-10-CM | POA: Diagnosis not present

## 2015-07-12 ENCOUNTER — Encounter: Payer: Self-pay | Admitting: Neurology

## 2015-07-12 ENCOUNTER — Ambulatory Visit (INDEPENDENT_AMBULATORY_CARE_PROVIDER_SITE_OTHER): Payer: Medicare Other | Admitting: Neurology

## 2015-07-12 VITALS — BP 110/68 | HR 63 | Ht 60.0 in | Wt 199.1 lb

## 2015-07-12 DIAGNOSIS — I6523 Occlusion and stenosis of bilateral carotid arteries: Secondary | ICD-10-CM | POA: Diagnosis not present

## 2015-07-12 DIAGNOSIS — R51 Headache: Secondary | ICD-10-CM | POA: Diagnosis not present

## 2015-07-12 DIAGNOSIS — R2689 Other abnormalities of gait and mobility: Secondary | ICD-10-CM

## 2015-07-12 DIAGNOSIS — G8929 Other chronic pain: Secondary | ICD-10-CM

## 2015-07-12 DIAGNOSIS — E0842 Diabetes mellitus due to underlying condition with diabetic polyneuropathy: Secondary | ICD-10-CM | POA: Diagnosis not present

## 2015-07-12 DIAGNOSIS — E119 Type 2 diabetes mellitus without complications: Secondary | ICD-10-CM | POA: Diagnosis not present

## 2015-07-12 DIAGNOSIS — R4189 Other symptoms and signs involving cognitive functions and awareness: Secondary | ICD-10-CM | POA: Diagnosis not present

## 2015-07-12 DIAGNOSIS — R296 Repeated falls: Secondary | ICD-10-CM | POA: Diagnosis not present

## 2015-07-12 DIAGNOSIS — S50811D Abrasion of right forearm, subsequent encounter: Secondary | ICD-10-CM | POA: Diagnosis not present

## 2015-07-12 DIAGNOSIS — Z48812 Encounter for surgical aftercare following surgery on the circulatory system: Secondary | ICD-10-CM | POA: Diagnosis not present

## 2015-07-12 DIAGNOSIS — I509 Heart failure, unspecified: Secondary | ICD-10-CM | POA: Diagnosis not present

## 2015-07-12 DIAGNOSIS — I110 Hypertensive heart disease with heart failure: Secondary | ICD-10-CM | POA: Diagnosis not present

## 2015-07-12 NOTE — Patient Instructions (Addendum)
1.  Start magnesium oxide 400-600mg  daily 2.  Continue tizanidine 2mg  at bedtime 3.  Please call my office if you would like to schedule memory testing  Return to clinic as needed

## 2015-07-12 NOTE — Progress Notes (Signed)
Follow-up Visit   Date: 07/12/2015    Bonnie Henry MRN: [id number] DOB: 1940-09-15   Interim History: Bonnie Henry is a 75 y.o. right-handed Caucasian female with insulin-dependent diabetes, COPD on oxygen, left breast cancer s/p mastectomy, severe RICA stenosis s/p CEA, hyperlipidemia, and depression returning to the clinic for follow-up of headaches and memory loss.  The patient was accompanied to the clinic by daughter who also provides collateral information.    History of present illness: Starting around 4 years ago, she began having issues with imbalance, short-term memory problems, and falls. Symptoms severely worsened in February 2017 after sustaining a fall and hitting her head. One fall occurred when walking into her bedroom and other fall occurred when she rolled out of her bed. There was no loss of consciousness. Since this time, daughter has noticed that she forgets things much easier than before. She has to be prompted and reminded of appointments frequently. She misplaces objects frequency and repeats herself often. She bathes and dresses herself, but daughter helps if she is feeling unwell. She does not cook or manage finances. She has fallen 6 times since February and feel imbalanced all the time. She has a cane and walker for home and uses a wheelchair for long distances. She has been using a cane since 2008.   She has generalized pain all over, including her back, hips, and knees. She has numbness and burning sensation of the feet bilaterally.   Starting in January 2017, she developed shooting pain all over her head. Pain has become constant over the past week. She has worsening headaches with coughing. Headache does not wake her up from sleeping. He has tried tylenol and hydrocodone which does not help. She denies any nausea or vomiting.   UPDATE 07/12/2015:   She reports having improved headaches with tizanidine and now only has transient  sharp, stabbing, pain around the eye.  Pain lasts less than 30 seconds.  No new changes in memory.  Her daughter helps with bathing and manages her medications.  She continues to be forgetful.  She complains of a lot of generalized joint pain for which she takes roxicodone 5mg .  Medications:  Current Outpatient Prescriptions on File Prior to Visit  Medication Sig Dispense Refill  . acetaminophen (TYLENOL) 500 MG tablet Take 2 tablets (1,000 mg total) by mouth every 6 (six) hours as needed for moderate pain. 30 tablet 0  . acetylcysteine (MUCOMYST) 20 % nebulizer solution Take by nebulization as needed.     Marland Kitchen albuterol (ACCUNEB) 0.63 MG/3ML nebulizer solution Take 1 ampule by nebulization every 6 (six) hours as needed for wheezing.    Marland Kitchen albuterol (PROAIR HFA) 108 (90 BASE) MCG/ACT inhaler Inhale 2 puffs into the lungs every 4 (four) hours as needed for wheezing or shortness of breath.     Marland Kitchen aspirin EC 81 MG tablet Take 81 mg by mouth daily.    Marland Kitchen atorvastatin (LIPITOR) 40 MG tablet TAKE ONE TABLET BY MOUTH AT BEDTIME. 30 tablet 4  . bumetanide (BUMEX) 1 MG tablet Take 1 mg by mouth daily.    . busPIRone (BUSPAR) 15 MG tablet Take 1 tablet (15 mg total) by mouth 2 (two) times daily. 60 tablet 6  . carvedilol (COREG) 12.5 MG tablet Take 12.5 mg by mouth 2 (two) times daily with a meal.     . clopidogrel (PLAVIX) 75 MG tablet Take 1 tablet (75 mg total) by mouth daily. 90 tablet 0  . fluticasone (FLONASE)  50 MCG/ACT nasal spray Place 1 spray into both nostrils 2 (two) times daily as needed for allergies.     Marland Kitchen gabapentin (NEURONTIN) 300 MG capsule Take 1 capsule (300 mg total) by mouth 2 (two) times daily. 60 capsule 3  . HUMALOG 100 UNIT/ML injection Inject 12 Units into the skin 3 (three) times daily with meals. PER SLIDING SCALE    . Hypromellose (ARTIFICIAL TEARS OP) Place 1 drop into both eyes 2 (two) times daily as needed (dry eyes).    . insulin glargine (LANTUS) 100 UNIT/ML injection Inject 0.3  mLs (30 Units total) into the skin daily. (Patient taking differently: Inject 38 Units into the skin daily. ) 1 vial 3  . lactulose (CHRONULAC) 10 GM/15ML solution Take 15 mLs (10 g total) by mouth 2 (two) times daily as needed for mild constipation. 1892 mL 5  . levothyroxine (SYNTHROID, LEVOTHROID) 100 MCG tablet Take 1 tablet (100 mcg total) by mouth every other day. (alternate with the 112 mcg strength) 15 tablet 2  . levothyroxine (SYNTHROID, LEVOTHROID) 112 MCG tablet Take 1 tablet (112 mcg total) by mouth every other day. (alternate with the 100 mcg strength) 15 tablet 2  . montelukast (SINGULAIR) 10 MG tablet Take 1 tablet (10 mg total) by mouth daily. 30 tablet 11  . Nerve Stimulator (STANDARD TENS) DEVI 1 Units by Does not apply route daily. 1 Device 0  . nitroGLYCERIN (NITROSTAT) 0.4 MG SL tablet Place 0.4 mg under the tongue every 5 (five) minutes as needed for chest pain.    Marland Kitchen oxyCODONE (OXY IR/ROXICODONE) 5 MG immediate release tablet Take 1 tablet (5 mg total) by mouth every 6 (six) hours as needed for moderate pain or severe pain. 30 tablet 0  . pantoprazole (PROTONIX) 20 MG tablet Take 1 tablet (20 mg total) by mouth daily. This replaces omeprazole 30 tablet 2  . QUEtiapine (SEROQUEL) 25 MG tablet Take 0.5 tablets (12.5 mg total) by mouth at bedtime. 15 tablet 6  . ranitidine (ZANTAC) 300 MG tablet Take 1 tablet (300 mg total) by mouth at bedtime. 30 tablet 2  . rOPINIRole (REQUIP) 1 MG tablet TAKE ONE TABLET BY MOUTH THREE TIMES DAILY. 90 tablet 6  . senna (SENOKOT) 8.6 MG TABS tablet Take 2 tablets by mouth at bedtime.     . sertraline (ZOLOFT) 100 MG tablet Take 1.5 tablets (150 mg total) by mouth daily. 45 tablet 6  . spironolactone (ALDACTONE) 25 MG tablet Take 25 mg by mouth daily.    . SYMBICORT 160-4.5 MCG/ACT inhaler INHALE TWO PUFFS BY MOUTH TWICE DAILY . RINSE MOUTH AFTER EACH USE 1 Inhaler 11  . tiotropium (SPIRIVA HANDIHALER) 18 MCG inhalation capsule Place 18 mcg into  inhaler and inhale daily.    Marland Kitchen tiZANidine (ZANAFLEX) 2 MG tablet Take 1 tablet (2 mg total) by mouth at bedtime. 30 tablet 5   No current facility-administered medications on file prior to visit.    Allergies:  Allergies  Allergen Reactions  . Vasotec [Enalapril] Swelling    Throat closes  . Enalapril Other (See Comments)    Angioedema  . Codeine Nausea And Vomiting  . Morphine And Related Nausea And Vomiting    Review of Systems:  CONSTITUTIONAL: No fevers, chills, night sweats, or weight loss.  EYES: No visual changes or eye pain ENT: No hearing changes.  No history of nose bleeds.   RESPIRATORY: No cough, wheezing and shortness of breath.   CARDIOVASCULAR: Negative for chest pain, and palpitations.  GI: Negative for abdominal discomfort, blood in stools or black stools.  No recent change in bowel habits.   GU:  No history of incontinence.   MUSCLOSKELETAL: No history of joint pain or swelling.  No myalgias.   SKIN: Negative for lesions, rash, and itching.   ENDOCRINE: Negative for cold or heat intolerance, polydipsia or goiter.   PSYCH:  + depression or anxiety symptoms.   NEURO: As Above.   Vital Signs:  BP 110/68 mmHg  Pulse 63  Ht 5' (1.524 m)  Wt 199 lb 1 oz (90.294 kg)  BMI 38.88 kg/m2  SpO2 95%  Neurological Exam: MENTAL STATUS including orientation to time, place, person, recent and remote memory, attention span and concentration, language, and fund of knowledge is fair.  Speech is not dysarthric.  CRANIAL NERVES: Pupils equal round and reactive to light.  Normal conjugate, extra-ocular eye movements in all directions of gaze.  No ptosis.  Face is symmetric. Palate elevates symmetrically.  Tongue is midline.  MOTOR:  Motor strength is 5/5 in all extremities. No pronator drift.  Tone is normal.    MSRs:  Reflexes are 2+/4 in the upper extremities, except trace at the patella and absent Achilles bilaterally.  SENSORY:  Absent vibration distal to ankles  bilaterally   COORDINATION/GAIT:  Although she arrived in wheelchair, she is able to walk and appears slow and stable.   Data: MRI cervical spine wo contrast 02/20/2015: 1. Cervical spondylosis and degenerative disc disease, causing mild impingement at the C4-5, C5-6, and C6-7 levels, as detailed above. 2. Suspected mild chronic ischemic microvascular white matter disease along the posterior pons, not changed from prior.  MRI brain wo contrast 04/25/2015: 1. No acute or focal abnormality to explain the patient's symptoms. 2. Moderate generalized atrophy and moderate to severe diffuse white matter disease. This likely reflects the sequela of chronic microvascular ischemia. 3. Stable exophthalmos.   IMPRESSION/PLAN: Cognitive impairment. She is on a number of medications which can contribute to cognitive dysfunction - MRI brain shows moderate white matter changes and atrophy - Formal neuropsychology testing declined.  It was explained that testing would help determine if her cognitive changes are due to dementia, mood, or polypharmacy.    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   Return to clinic as needed   The duration of this appointment visit was 25 minutes of face-to-face time with the patient.  Greater than 50% of this time was spent in counseling, explanation of diagnosis, planning of further management, and coordination of care.   Thank you for allowing me to participate in patient's care.  If I can answer any additional questions, I  would be pleased to do so.    Sincerely,    Donika K. Posey Pronto, DO

## 2015-07-13 DIAGNOSIS — I509 Heart failure, unspecified: Secondary | ICD-10-CM | POA: Diagnosis not present

## 2015-07-13 DIAGNOSIS — Z48812 Encounter for surgical aftercare following surgery on the circulatory system: Secondary | ICD-10-CM | POA: Diagnosis not present

## 2015-07-13 DIAGNOSIS — R296 Repeated falls: Secondary | ICD-10-CM | POA: Diagnosis not present

## 2015-07-13 DIAGNOSIS — E119 Type 2 diabetes mellitus without complications: Secondary | ICD-10-CM | POA: Diagnosis not present

## 2015-07-13 DIAGNOSIS — I110 Hypertensive heart disease with heart failure: Secondary | ICD-10-CM | POA: Diagnosis not present

## 2015-07-13 DIAGNOSIS — S50811D Abrasion of right forearm, subsequent encounter: Secondary | ICD-10-CM | POA: Diagnosis not present

## 2015-07-21 ENCOUNTER — Emergency Department (HOSPITAL_COMMUNITY): Payer: Medicare Other

## 2015-07-21 ENCOUNTER — Inpatient Hospital Stay (HOSPITAL_COMMUNITY)
Admission: EM | Admit: 2015-07-21 | Discharge: 2015-07-27 | DRG: 871 | Disposition: A | Discharge status: Home / Self Care | Payer: Medicare Other | Attending: Internal Medicine | Admitting: Internal Medicine

## 2015-07-21 ENCOUNTER — Encounter (HOSPITAL_COMMUNITY): Payer: Self-pay | Admitting: *Deleted

## 2015-07-21 DIAGNOSIS — G629 Polyneuropathy, unspecified: Secondary | ICD-10-CM | POA: Diagnosis not present

## 2015-07-21 DIAGNOSIS — Z955 Presence of coronary angioplasty implant and graft: Secondary | ICD-10-CM

## 2015-07-21 DIAGNOSIS — G2581 Restless legs syndrome: Secondary | ICD-10-CM | POA: Diagnosis present

## 2015-07-21 DIAGNOSIS — F419 Anxiety disorder, unspecified: Secondary | ICD-10-CM | POA: Diagnosis present

## 2015-07-21 DIAGNOSIS — I252 Old myocardial infarction: Secondary | ICD-10-CM | POA: Diagnosis not present

## 2015-07-21 DIAGNOSIS — E1165 Type 2 diabetes mellitus with hyperglycemia: Secondary | ICD-10-CM | POA: Diagnosis not present

## 2015-07-21 DIAGNOSIS — J189 Pneumonia, unspecified organism: Secondary | ICD-10-CM

## 2015-07-21 DIAGNOSIS — Z9981 Dependence on supplemental oxygen: Secondary | ICD-10-CM

## 2015-07-21 DIAGNOSIS — E1121 Type 2 diabetes mellitus with diabetic nephropathy: Secondary | ICD-10-CM | POA: Diagnosis present

## 2015-07-21 DIAGNOSIS — J690 Pneumonitis due to inhalation of food and vomit: Secondary | ICD-10-CM | POA: Diagnosis present

## 2015-07-21 DIAGNOSIS — Z79899 Other long term (current) drug therapy: Secondary | ICD-10-CM

## 2015-07-21 DIAGNOSIS — E874 Mixed disorder of acid-base balance: Secondary | ICD-10-CM | POA: Diagnosis present

## 2015-07-21 DIAGNOSIS — Z9010 Acquired absence of unspecified breast and nipple: Secondary | ICD-10-CM | POA: Diagnosis not present

## 2015-07-21 DIAGNOSIS — Z7902 Long term (current) use of antithrombotics/antiplatelets: Secondary | ICD-10-CM

## 2015-07-21 DIAGNOSIS — K219 Gastro-esophageal reflux disease without esophagitis: Secondary | ICD-10-CM | POA: Diagnosis present

## 2015-07-21 DIAGNOSIS — D509 Iron deficiency anemia, unspecified: Secondary | ICD-10-CM | POA: Diagnosis not present

## 2015-07-21 DIAGNOSIS — E7800 Pure hypercholesterolemia, unspecified: Secondary | ICD-10-CM | POA: Diagnosis present

## 2015-07-21 DIAGNOSIS — J441 Chronic obstructive pulmonary disease with (acute) exacerbation: Secondary | ICD-10-CM

## 2015-07-21 DIAGNOSIS — A419 Sepsis, unspecified organism: Secondary | ICD-10-CM | POA: Diagnosis not present

## 2015-07-21 DIAGNOSIS — Z7951 Long term (current) use of inhaled steroids: Secondary | ICD-10-CM

## 2015-07-21 DIAGNOSIS — J9611 Chronic respiratory failure with hypoxia: Secondary | ICD-10-CM | POA: Diagnosis not present

## 2015-07-21 DIAGNOSIS — I2510 Atherosclerotic heart disease of native coronary artery without angina pectoris: Secondary | ICD-10-CM

## 2015-07-21 DIAGNOSIS — Z9841 Cataract extraction status, right eye: Secondary | ICD-10-CM

## 2015-07-21 DIAGNOSIS — Z794 Long term (current) use of insulin: Secondary | ICD-10-CM

## 2015-07-21 DIAGNOSIS — Z87891 Personal history of nicotine dependence: Secondary | ICD-10-CM

## 2015-07-21 DIAGNOSIS — I480 Paroxysmal atrial fibrillation: Secondary | ICD-10-CM | POA: Diagnosis present

## 2015-07-21 DIAGNOSIS — Z96652 Presence of left artificial knee joint: Secondary | ICD-10-CM | POA: Diagnosis present

## 2015-07-21 DIAGNOSIS — G9341 Metabolic encephalopathy: Secondary | ICD-10-CM | POA: Diagnosis present

## 2015-07-21 DIAGNOSIS — Z853 Personal history of malignant neoplasm of breast: Secondary | ICD-10-CM

## 2015-07-21 DIAGNOSIS — R05 Cough: Secondary | ICD-10-CM | POA: Diagnosis not present

## 2015-07-21 DIAGNOSIS — Z9842 Cataract extraction status, left eye: Secondary | ICD-10-CM

## 2015-07-21 DIAGNOSIS — N179 Acute kidney failure, unspecified: Secondary | ICD-10-CM | POA: Diagnosis present

## 2015-07-21 DIAGNOSIS — E1151 Type 2 diabetes mellitus with diabetic peripheral angiopathy without gangrene: Secondary | ICD-10-CM | POA: Diagnosis present

## 2015-07-21 DIAGNOSIS — F329 Major depressive disorder, single episode, unspecified: Secondary | ICD-10-CM | POA: Diagnosis present

## 2015-07-21 DIAGNOSIS — Z7982 Long term (current) use of aspirin: Secondary | ICD-10-CM

## 2015-07-21 DIAGNOSIS — Z9861 Coronary angioplasty status: Secondary | ICD-10-CM

## 2015-07-21 DIAGNOSIS — I10 Essential (primary) hypertension: Secondary | ICD-10-CM | POA: Diagnosis present

## 2015-07-21 DIAGNOSIS — Z96643 Presence of artificial hip joint, bilateral: Secondary | ICD-10-CM | POA: Diagnosis present

## 2015-07-21 DIAGNOSIS — D6489 Other specified anemias: Secondary | ICD-10-CM

## 2015-07-21 DIAGNOSIS — J9811 Atelectasis: Secondary | ICD-10-CM | POA: Diagnosis not present

## 2015-07-21 DIAGNOSIS — R509 Fever, unspecified: Secondary | ICD-10-CM | POA: Diagnosis not present

## 2015-07-21 DIAGNOSIS — R4182 Altered mental status, unspecified: Secondary | ICD-10-CM | POA: Diagnosis not present

## 2015-07-21 DIAGNOSIS — R0602 Shortness of breath: Secondary | ICD-10-CM | POA: Diagnosis not present

## 2015-07-21 DIAGNOSIS — E662 Morbid (severe) obesity with alveolar hypoventilation: Secondary | ICD-10-CM | POA: Diagnosis not present

## 2015-07-21 DIAGNOSIS — Z6839 Body mass index (BMI) 39.0-39.9, adult: Secondary | ICD-10-CM

## 2015-07-21 DIAGNOSIS — Z79891 Long term (current) use of opiate analgesic: Secondary | ICD-10-CM

## 2015-07-21 DIAGNOSIS — Z961 Presence of intraocular lens: Secondary | ICD-10-CM | POA: Diagnosis present

## 2015-07-21 DIAGNOSIS — Z888 Allergy status to other drugs, medicaments and biological substances status: Secondary | ICD-10-CM

## 2015-07-21 DIAGNOSIS — D649 Anemia, unspecified: Secondary | ICD-10-CM | POA: Diagnosis present

## 2015-07-21 DIAGNOSIS — Z885 Allergy status to narcotic agent status: Secondary | ICD-10-CM

## 2015-07-21 DIAGNOSIS — M179 Osteoarthritis of knee, unspecified: Secondary | ICD-10-CM | POA: Diagnosis not present

## 2015-07-21 DIAGNOSIS — G4730 Sleep apnea, unspecified: Secondary | ICD-10-CM | POA: Diagnosis present

## 2015-07-21 DIAGNOSIS — E890 Postprocedural hypothyroidism: Secondary | ICD-10-CM | POA: Diagnosis present

## 2015-07-21 LAB — CBC WITH DIFFERENTIAL/PLATELET
BASOS ABS: 0 10*3/uL (ref 0.0–0.1)
BASOS PCT: 0 %
EOS PCT: 1 %
Eosinophils Absolute: 0.1 10*3/uL (ref 0.0–0.7)
HEMATOCRIT: 29.2 % — AB (ref 36.0–46.0)
Hemoglobin: 8.7 g/dL — ABNORMAL LOW (ref 12.0–15.0)
LYMPHS PCT: 8 %
Lymphs Abs: 1.3 10*3/uL (ref 0.7–4.0)
MCH: 25.1 pg — ABNORMAL LOW (ref 26.0–34.0)
MCHC: 29.8 g/dL — AB (ref 30.0–36.0)
MCV: 84.1 fL (ref 78.0–100.0)
MONO ABS: 1.1 10*3/uL — AB (ref 0.1–1.0)
Monocytes Relative: 7 %
NEUTROS ABS: 14.1 10*3/uL — AB (ref 1.7–7.7)
Neutrophils Relative %: 84 %
Platelets: 189 10*3/uL (ref 150–400)
RBC: 3.47 MIL/uL — AB (ref 3.87–5.11)
RDW: 18.1 % — AB (ref 11.5–15.5)
WBC: 16.6 10*3/uL — AB (ref 4.0–10.5)

## 2015-07-21 LAB — URINALYSIS, ROUTINE W REFLEX MICROSCOPIC
BILIRUBIN URINE: NEGATIVE
GLUCOSE, UA: NEGATIVE mg/dL
HGB URINE DIPSTICK: NEGATIVE
KETONES UR: NEGATIVE mg/dL
Leukocytes, UA: NEGATIVE
Nitrite: NEGATIVE
PH: 8 (ref 5.0–8.0)
Protein, ur: 30 mg/dL — AB
Specific Gravity, Urine: 1.023 (ref 1.005–1.030)

## 2015-07-21 LAB — COMPREHENSIVE METABOLIC PANEL
ALBUMIN: 3.6 g/dL (ref 3.5–5.0)
ALK PHOS: 104 U/L (ref 38–126)
ALT: 15 U/L (ref 14–54)
ANION GAP: 8 (ref 5–15)
AST: 22 U/L (ref 15–41)
BILIRUBIN TOTAL: 1.3 mg/dL — AB (ref 0.3–1.2)
BUN: 11 mg/dL (ref 6–20)
CALCIUM: 8.9 mg/dL (ref 8.9–10.3)
CO2: 29 mmol/L (ref 22–32)
Chloride: 99 mmol/L — ABNORMAL LOW (ref 101–111)
Creatinine, Ser: 0.98 mg/dL (ref 0.44–1.00)
GFR calc Af Amer: >60 mL/min (ref >60)
GFR, EST NON AFRICAN AMERICAN: 56 mL/min — AB (ref >60)
GLUCOSE: 120 mg/dL — AB (ref 65–99)
POTASSIUM: 4 mmol/L (ref 3.5–5.1)
Sodium: 136 mmol/L (ref 135–145)
TOTAL PROTEIN: 7.2 g/dL (ref 6.5–8.1)

## 2015-07-21 LAB — CREATININE, SERUM
CREATININE: 1.05 mg/dL — AB (ref 0.44–1.00)
GFR calc Af Amer: 60 mL/min — ABNORMAL LOW (ref >60)
GFR, EST NON AFRICAN AMERICAN: 51 mL/min — AB (ref >60)

## 2015-07-21 LAB — IRON AND TIBC
IRON: 9 ug/dL — AB (ref 28–170)
Saturation Ratios: 3 % — ABNORMAL LOW (ref 10.4–31.8)
TIBC: 333 ug/dL (ref 250–450)
UIBC: 324 ug/dL

## 2015-07-21 LAB — URINE MICROSCOPIC-ADD ON: RBC / HPF: NONE SEEN RBC/hpf (ref 0–5)

## 2015-07-21 LAB — I-STAT ARTERIAL BLOOD GAS, ED
Acid-Base Excess: 5 mmol/L — ABNORMAL HIGH (ref 0.0–2.0)
Bicarbonate: 30.4 mEq/L — ABNORMAL HIGH (ref 20.0–24.0)
O2 SAT: 98 %
TCO2: 32 mmol/L (ref 0–100)
pCO2 arterial: 46.3 mmHg — ABNORMAL HIGH (ref 35.0–45.0)
pH, Arterial: 7.426 (ref 7.350–7.450)
pO2, Arterial: 96 mmHg (ref 80.0–100.0)

## 2015-07-21 LAB — GLUCOSE, CAPILLARY
GLUCOSE-CAPILLARY: 140 mg/dL — AB (ref 65–99)
GLUCOSE-CAPILLARY: 258 mg/dL — AB (ref 65–99)

## 2015-07-21 LAB — I-STAT CG4 LACTIC ACID, ED: Lactic Acid, Venous: 1.47 mmol/L (ref 0.5–1.9)

## 2015-07-21 LAB — STREP PNEUMONIAE URINARY ANTIGEN: Strep Pneumo Urinary Antigen: NEGATIVE

## 2015-07-21 LAB — FERRITIN: FERRITIN: 23 ng/mL (ref 11–307)

## 2015-07-21 LAB — TSH: TSH: 0.482 u[IU]/mL (ref 0.350–4.500)

## 2015-07-21 MED ORDER — VANCOMYCIN HCL IN DEXTROSE 1-5 GM/200ML-% IV SOLN
1000.0000 mg | Freq: Once | INTRAVENOUS | Status: AC
  Administered 2015-07-21: 1000 mg via INTRAVENOUS
  Filled 2015-07-21: qty 200

## 2015-07-21 MED ORDER — SERTRALINE HCL 50 MG PO TABS
150.0000 mg | ORAL_TABLET | Freq: Every day | ORAL | Status: DC
  Administered 2015-07-21 – 2015-07-26 (×6): 150 mg via ORAL
  Filled 2015-07-21 (×6): qty 1

## 2015-07-21 MED ORDER — ARTIFICIAL TEARS OP OINT
TOPICAL_OINTMENT | Freq: Every day | OPHTHALMIC | Status: DC
  Administered 2015-07-21 – 2015-07-22 (×2): 1 via OPHTHALMIC
  Administered 2015-07-23: 10:00:00 via OPHTHALMIC
  Administered 2015-07-24: 1 via OPHTHALMIC
  Administered 2015-07-25 – 2015-07-27 (×3): via OPHTHALMIC
  Filled 2015-07-21 (×3): qty 3.5

## 2015-07-21 MED ORDER — ALBUTEROL SULFATE (2.5 MG/3ML) 0.083% IN NEBU
2.5000 mg | INHALATION_SOLUTION | RESPIRATORY_TRACT | Status: DC | PRN
  Administered 2015-07-22 – 2015-07-24 (×2): 2.5 mg via RESPIRATORY_TRACT
  Filled 2015-07-21 (×2): qty 3

## 2015-07-21 MED ORDER — ASPIRIN EC 81 MG PO TBEC
81.0000 mg | DELAYED_RELEASE_TABLET | Freq: Every day | ORAL | Status: DC
  Administered 2015-07-21 – 2015-07-27 (×7): 81 mg via ORAL
  Filled 2015-07-21 (×7): qty 1

## 2015-07-21 MED ORDER — ROPINIROLE HCL 1 MG PO TABS
1.0000 mg | ORAL_TABLET | Freq: Three times a day (TID) | ORAL | Status: DC
  Administered 2015-07-21 – 2015-07-27 (×18): 1 mg via ORAL
  Filled 2015-07-21 (×20): qty 1

## 2015-07-21 MED ORDER — FAMOTIDINE 20 MG PO TABS
20.0000 mg | ORAL_TABLET | Freq: Two times a day (BID) | ORAL | Status: DC
  Administered 2015-07-21 – 2015-07-27 (×12): 20 mg via ORAL
  Filled 2015-07-21 (×12): qty 1

## 2015-07-21 MED ORDER — PANTOPRAZOLE SODIUM 20 MG PO TBEC
20.0000 mg | DELAYED_RELEASE_TABLET | Freq: Every day | ORAL | Status: DC
  Administered 2015-07-21 – 2015-07-26 (×6): 20 mg via ORAL
  Filled 2015-07-21 (×7): qty 1

## 2015-07-21 MED ORDER — PIPERACILLIN-TAZOBACTAM 3.375 G IVPB
3.3750 g | Freq: Three times a day (TID) | INTRAVENOUS | Status: DC
  Administered 2015-07-21 – 2015-07-27 (×17): 3.375 g via INTRAVENOUS
  Filled 2015-07-21 (×20): qty 50

## 2015-07-21 MED ORDER — PIPERACILLIN-TAZOBACTAM 3.375 G IVPB 30 MIN
3.3750 g | Freq: Once | INTRAVENOUS | Status: AC
  Administered 2015-07-21: 3.375 g via INTRAVENOUS
  Filled 2015-07-21: qty 50

## 2015-07-21 MED ORDER — SODIUM CHLORIDE 0.45 % IV SOLN
INTRAVENOUS | Status: DC
  Administered 2015-07-21: 17:00:00 via INTRAVENOUS

## 2015-07-21 MED ORDER — CLOPIDOGREL BISULFATE 75 MG PO TABS
75.0000 mg | ORAL_TABLET | Freq: Every day | ORAL | Status: DC
  Administered 2015-07-21 – 2015-07-27 (×7): 75 mg via ORAL
  Filled 2015-07-21 (×7): qty 1

## 2015-07-21 MED ORDER — MONTELUKAST SODIUM 10 MG PO TABS
10.0000 mg | ORAL_TABLET | Freq: Every day | ORAL | Status: DC
  Administered 2015-07-21 – 2015-07-26 (×6): 10 mg via ORAL
  Filled 2015-07-21 (×6): qty 1

## 2015-07-21 MED ORDER — SODIUM CHLORIDE 0.9 % IV BOLUS (SEPSIS)
1000.0000 mL | Freq: Once | INTRAVENOUS | Status: AC
  Administered 2015-07-21: 1000 mL via INTRAVENOUS

## 2015-07-21 MED ORDER — GABAPENTIN 300 MG PO CAPS
300.0000 mg | ORAL_CAPSULE | Freq: Two times a day (BID) | ORAL | Status: DC
  Administered 2015-07-21 – 2015-07-27 (×12): 300 mg via ORAL
  Filled 2015-07-21 (×12): qty 1

## 2015-07-21 MED ORDER — SENNA 8.6 MG PO TABS
1.0000 | ORAL_TABLET | Freq: Every day | ORAL | Status: DC | PRN
Start: 2015-07-21 — End: 2015-07-27
  Administered 2015-07-23: 8.6 mg via ORAL
  Filled 2015-07-21: qty 1

## 2015-07-21 MED ORDER — PIPERACILLIN-TAZOBACTAM 3.375 G IVPB 30 MIN: 3.3750 g | Freq: Three times a day (TID) | INTRAVENOUS | Status: DC

## 2015-07-21 MED ORDER — VANCOMYCIN HCL IN DEXTROSE 750-5 MG/150ML-% IV SOLN
750.0000 mg | Freq: Two times a day (BID) | INTRAVENOUS | Status: DC
  Administered 2015-07-22 – 2015-07-24 (×5): 750 mg via INTRAVENOUS
  Filled 2015-07-21 (×7): qty 150

## 2015-07-21 MED ORDER — ACETAMINOPHEN 500 MG PO TABS: 1000.0000 mg | ORAL_TABLET | Freq: Four times a day (QID) | ORAL | Status: DC | PRN

## 2015-07-21 MED ORDER — LACTULOSE 10 GM/15ML PO SOLN
10.0000 g | Freq: Two times a day (BID) | ORAL | Status: DC | PRN
  Filled 2015-07-21: qty 15

## 2015-07-21 MED ORDER — FLUTICASONE PROPIONATE 50 MCG/ACT NA SUSP
2.0000 | Freq: Every day | NASAL | Status: DC
  Administered 2015-07-21 – 2015-07-25 (×5): 2 via NASAL
  Filled 2015-07-21: qty 16

## 2015-07-21 MED ORDER — ATORVASTATIN CALCIUM 40 MG PO TABS
40.0000 mg | ORAL_TABLET | Freq: Every day | ORAL | Status: DC
  Administered 2015-07-21 – 2015-07-26 (×6): 40 mg via ORAL
  Filled 2015-07-21 (×7): qty 1

## 2015-07-21 MED ORDER — SODIUM CHLORIDE 0.9 % IV BOLUS (SEPSIS)
500.0000 mL | Freq: Once | INTRAVENOUS | Status: AC
  Administered 2015-07-21: 500 mL via INTRAVENOUS

## 2015-07-21 MED ORDER — ENOXAPARIN SODIUM 40 MG/0.4ML ~~LOC~~ SOLN
40.0000 mg | SUBCUTANEOUS | Status: DC
  Administered 2015-07-23 – 2015-07-25 (×2): 40 mg via SUBCUTANEOUS
  Filled 2015-07-21 (×4): qty 0.4

## 2015-07-21 MED ORDER — QUETIAPINE FUMARATE 25 MG PO TABS
12.5000 mg | ORAL_TABLET | Freq: Every day | ORAL | Status: DC
  Administered 2015-07-21 – 2015-07-26 (×6): 12.5 mg via ORAL
  Filled 2015-07-21 (×7): qty 1

## 2015-07-21 MED ORDER — IPRATROPIUM-ALBUTEROL 0.5-2.5 (3) MG/3ML IN SOLN
3.0000 mL | Freq: Four times a day (QID) | RESPIRATORY_TRACT | Status: DC
  Administered 2015-07-22 – 2015-07-27 (×21): 3 mL via RESPIRATORY_TRACT
  Filled 2015-07-21 (×22): qty 3

## 2015-07-21 MED ORDER — METHYLPREDNISOLONE SODIUM SUCC 125 MG IJ SOLR
60.0000 mg | Freq: Once | INTRAMUSCULAR | Status: AC
  Administered 2015-07-21: 60 mg via INTRAVENOUS
  Filled 2015-07-21: qty 2

## 2015-07-21 MED ORDER — PREDNISONE 20 MG PO TABS
40.0000 mg | ORAL_TABLET | Freq: Every day | ORAL | Status: DC
  Administered 2015-07-22: 40 mg via ORAL
  Filled 2015-07-21: qty 2

## 2015-07-21 MED ORDER — BUSPIRONE HCL 15 MG PO TABS
15.0000 mg | ORAL_TABLET | Freq: Two times a day (BID) | ORAL | Status: DC
  Administered 2015-07-21 – 2015-07-27 (×12): 15 mg via ORAL
  Filled 2015-07-21 (×13): qty 1

## 2015-07-21 MED ORDER — ACETAMINOPHEN 325 MG PO TABS
650.0000 mg | ORAL_TABLET | Freq: Once | ORAL | Status: AC
  Administered 2015-07-21: 650 mg via ORAL
  Filled 2015-07-21: qty 2

## 2015-07-21 MED ORDER — NITROGLYCERIN 0.4 MG SL SUBL: 0.4000 mg | SUBLINGUAL_TABLET | SUBLINGUAL | Status: DC | PRN

## 2015-07-21 MED ORDER — PIPERACILLIN-TAZOBACTAM 3.375 G IVPB: 3.3750 g | Freq: Three times a day (TID) | INTRAVENOUS | Status: DC

## 2015-07-21 MED ORDER — SODIUM CHLORIDE 0.9% FLUSH
3.0000 mL | Freq: Two times a day (BID) | INTRAVENOUS | Status: DC
  Administered 2015-07-21 – 2015-07-27 (×6): 3 mL via INTRAVENOUS

## 2015-07-21 MED ORDER — LEVOTHYROXINE SODIUM 100 MCG PO TABS
100.0000 ug | ORAL_TABLET | ORAL | Status: DC
Start: 2015-07-23 — End: 2015-07-27
  Administered 2015-07-23 – 2015-07-26 (×4): 100 ug via ORAL
  Filled 2015-07-21 (×4): qty 1

## 2015-07-21 MED ORDER — TIZANIDINE HCL 4 MG PO TABS
2.0000 mg | ORAL_TABLET | Freq: Every day | ORAL | Status: DC
  Administered 2015-07-21 – 2015-07-26 (×6): 2 mg via ORAL
  Filled 2015-07-21 (×7): qty 1

## 2015-07-21 MED ORDER — LEVOTHYROXINE SODIUM 112 MCG PO TABS
112.0000 ug | ORAL_TABLET | ORAL | Status: DC
  Administered 2015-07-22 – 2015-07-27 (×2): 112 ug via ORAL
  Filled 2015-07-21 (×2): qty 1

## 2015-07-21 MED ORDER — INSULIN ASPART 100 UNIT/ML ~~LOC~~ SOLN
0.0000 [IU] | Freq: Three times a day (TID) | SUBCUTANEOUS | Status: DC
  Administered 2015-07-21: 2 [IU] via SUBCUTANEOUS
  Administered 2015-07-22: 11 [IU] via SUBCUTANEOUS
  Administered 2015-07-22 (×2): 8 [IU] via SUBCUTANEOUS
  Administered 2015-07-23 (×2): 3 [IU] via SUBCUTANEOUS
  Administered 2015-07-23: 5 [IU] via SUBCUTANEOUS
  Administered 2015-07-24: 11 [IU] via SUBCUTANEOUS
  Administered 2015-07-24: 2 [IU] via SUBCUTANEOUS
  Administered 2015-07-25: 5 [IU] via SUBCUTANEOUS
  Administered 2015-07-25: 2 [IU] via SUBCUTANEOUS
  Administered 2015-07-25 – 2015-07-26 (×2): 5 [IU] via SUBCUTANEOUS
  Administered 2015-07-26: 2 [IU] via SUBCUTANEOUS
  Administered 2015-07-26: 11 [IU] via SUBCUTANEOUS
  Administered 2015-07-27 (×2): 8 [IU] via SUBCUTANEOUS

## 2015-07-21 NOTE — ED Provider Notes (Signed)
CSN: [id number]     Arrival date & time 07/21/15  1111 History   First MD Initiated Contact with Patient 07/21/15 1117     Chief Complaint  Patient presents with  . Code Sepsis     (Consider location/radiation/quality/duration/timing/severity/associated sxs/prior Treatment) HPI Comments: Level V caveat for altered mental status. Patient brought in by EMS with fever, coughing, posttussive emesis, shortness of breath and confusion. Last seen normal last night. EMS reports fever to 102 and one episode of vomiting. On oxygen chronically for COPD. Patient oriented 2 only which is not her baseline. She denies any chest pain or abdominal pain. There is no reported trauma. She is on aspirin and Plavix but no other blood thinners.  The history is provided by the patient and the EMS personnel.    Past Medical History  Diagnosis Date  . Diabetes mellitus without complication ([place])   . Atrial fibrillation ([place])   . Hypertension   . GERD (gastroesophageal reflux disease)   . CAD (coronary artery disease)   . Hypercholesteremia   . COPD (chronic obstructive pulmonary disease) ([place])   . Morbid obesity ([place])   . Post-surgical hypothyroidism   . Depression, major ([place])   . Neuropathy ([place])   . Asthma   . Sleep apnea     "2015 wore mask; lost weight; told didn't need mask anymore" (02/23/2014)  . Type II diabetes mellitus ([place])   . Anxiety   . Anemia   . History of blood transfusion     "when I had my mastectomy"  . Chronic back pain   . Breast cancer, left breast (H[facility])     S/P mastectomy  . Myocardial infarction ([place])   . Shortness of breath dyspnea   . Heart murmur   . Headache   . History of hiatal hernia   . Constipation 03/04/2015  . CHF (congestive heart failure) ([place])   . Peripheral vascular disease ([place])   . Hepatitis     HEP "C"  . Arthritis     "joints ache all over" . osteoarthritis  . DVT (deep venous thrombosis) ([place])   . Restless leg syndrome   . History of right-sided  carotid endarterectomy 06/10/2015   Past Surgical History  Procedure Laterality Date  . Bartholin cyst marsupialization    . Trigger finger release Left   . Cataract extraction w/ intraocular lens  implant, bilateral Bilateral ~ 2011  . Total knee arthroplasty Left 1990's  . Total hip arthroplasty Bilateral 1990's  . Thyroidectomy, partial  1950's  . Shoulder surgery Right     "cleaned it out; no scope"  . Toe surgery Right     "cut piece of bone out so toe didn't dig into my foot"  . Carotid endarterectomy Left 2009  . Hammer toe surgery Left     2nd and 3rd digits  . Tonsillectomy  1950's  . Mastectomy Left 1990's  . Tubal ligation    . Vaginal hysterectomy      "partial"  . Coronary angioplasty with stent placement  1990's X 2    "2; 1"  . Cardiac catheterization  2000's  . Left heart catheterization with coronary angiogram N/A 02/24/2014    Procedure: LEFT HEART CATHETERIZATION WITH CORONARY ANGIOGRAM;  Surgeon: Leonie Man, MD;  Location: [facility] CATH LAB;  Service: Cardiovascular;  Laterality: N/A;  . Artery biopsy Right 09/29/2014    Procedure: BIOPSY TEMPORAL ARTERY;  Surgeon: Angelia Mould, MD;  Location: [facility];  Service: Vascular;  Laterality: Right;  . Esophagogastroduodenoscopy (egd) with propofol N/A 12/12/2014    Procedure: ESOPHAGOGASTRODUODENOSCOPY (EGD) WITH PROPOFOL;  Surgeon: Lucilla Lame, MD;  Location: [facility] ENDOSCOPY;  Service: Endoscopy;  Laterality: N/A;  . Breast biopsy Left     Mastectomy  . Eye surgery Bilateral     Catract Extraction with IOL  . Joint replacement Bilateral     Total Hip Replacement  . Joint replacement Left     Total Knee Replacement  . Endarterectomy Right 05/16/2015    Procedure: ENDARTERECTOMY CAROTID;  Surgeon: Katha Cabal, MD;  Location: [facility] ORS;  Service: Vascular;  Laterality: Right;   Family History  Problem Relation Age of Onset  . Arthritis Mother   . Cancer Mother   . Diabetes Mother   . Heart disease Mother    . Hyperlipidemia Mother   . Hypertension Mother   . Alcohol abuse Father   . Alcohol abuse Brother   . Arthritis Brother   . Asthma Brother   . HIV Brother   . Cancer Brother   . Diabetes Brother   . Hyperlipidemia Brother   . Hypertension Brother   . Lung disease Brother   . Arthritis Maternal Grandmother   . Brain cancer Father    Social History  Substance Use Topics  . Smoking status: Former Smoker -- 1.50 packs/day for 44 years    Types: Cigarettes    Quit date: 01/06/1994  . Smokeless tobacco: Never Used     Comment: "quit smoking cigarettes in ~ 2000"  . Alcohol Use: No     Comment: 02/23/2014 "no alcohol since 1990's"   OB History    Gravida Para Term Preterm AB TAB SAB Ectopic Multiple Living   0 0 0 0 0 0 0 0       Review of Systems  Unable to perform ROS: Mental status change      Allergies  Vasotec; Enalapril; Codeine; and Morphine and related  Home Medications   Prior to Admission medications   Medication Sig Start Date End Date Taking? Authorizing Provider  acetaminophen (TYLENOL) 500 MG tablet Take 2 tablets (1,000 mg total) by mouth every 6 (six) hours as needed for moderate pain. 05/08/15   Duffy Bruce, MD  acetylcysteine (MUCOMYST) 20 % nebulizer solution Take by nebulization as needed.  11/09/14 11/09/15  Historical Provider, MD  albuterol (ACCUNEB) 0.63 MG/3ML nebulizer solution Take 1 ampule by nebulization every 6 (six) hours as needed for wheezing.    Historical Provider, MD  albuterol (PROAIR HFA) 108 (90 BASE) MCG/ACT inhaler Inhale 2 puffs into the lungs every 4 (four) hours as needed for wheezing or shortness of breath.     Historical Provider, MD  aspirin EC 81 MG tablet Take 81 mg by mouth daily.    Historical Provider, MD  atorvastatin (LIPITOR) 40 MG tablet TAKE ONE TABLET BY MOUTH AT BEDTIME. 04/11/15   Arnetha Courser, MD  bumetanide (BUMEX) 1 MG tablet Take 1 mg by mouth daily. 08/07/14   Historical Provider, MD  bumetanide (BUMEX) 2 MG  tablet  06/09/15   Historical Provider, MD  busPIRone (BUSPAR) 15 MG tablet Take 1 tablet (15 mg total) by mouth 2 (two) times daily. 01/22/15   Arnetha Courser, MD  carvedilol (COREG) 12.5 MG tablet Take 12.5 mg by mouth 2 (two) times daily with a meal.  01/24/14   Historical Provider, MD  clopidogrel (PLAVIX) 75 MG tablet Take 1 tablet (75 mg total) by mouth daily. 06/08/15  Arnetha Courser, MD  fluticasone (FLONASE) 50 MCG/ACT nasal spray Place 1 spray into both nostrils 2 (two) times daily as needed for allergies.  10/16/14   Historical Provider, MD  gabapentin (NEURONTIN) 300 MG capsule Take 1 capsule (300 mg total) by mouth 2 (two) times daily. 05/04/15   Arnetha Courser, MD  HUMALOG 100 UNIT/ML injection Inject 12 Units into the skin 3 (three) times daily with meals. PER SLIDING SCALE 06/13/14   Historical Provider, MD  Hypromellose (ARTIFICIAL TEARS OP) Place 1 drop into both eyes 2 (two) times daily as needed (dry eyes).    Historical Provider, MD  insulin glargine (LANTUS) 100 UNIT/ML injection Inject 0.3 mLs (30 Units total) into the skin daily. Patient taking differently: Inject 38 Units into the skin daily.  09/28/14   Arnetha Courser, MD  insulin NPH Human (HUMULIN N,NOVOLIN N) 100 UNIT/ML injection Inject into the skin. 06/14/15 06/13/16  Historical Provider, MD  lactulose (CHRONULAC) 10 GM/15ML solution Take 15 mLs (10 g total) by mouth 2 (two) times daily as needed for mild constipation. 02/20/15   Arnetha Courser, MD  levothyroxine (SYNTHROID, LEVOTHROID) 100 MCG tablet Take 1 tablet (100 mcg total) by mouth every other day. (alternate with the 112 mcg strength) 09/08/14   Arnetha Courser, MD  levothyroxine (SYNTHROID, LEVOTHROID) 112 MCG tablet Take 1 tablet (112 mcg total) by mouth every other day. (alternate with the 100 mcg strength) 09/08/14   Arnetha Courser, MD  montelukast (SINGULAIR) 10 MG tablet Take 1 tablet (10 mg total) by mouth daily. 12/07/14   Arnetha Courser, MD  Nerve Stimulator (STANDARD  TENS) DEVI 1 Units by Does not apply route daily. 03/30/15   Arnetha Courser, MD  nitroGLYCERIN (NITROSTAT) 0.4 MG SL tablet Place 0.4 mg under the tongue every 5 (five) minutes as needed for chest pain.    Historical Provider, MD  omeprazole (PRILOSEC) 20 MG capsule  05/28/15   Historical Provider, MD  oxyCODONE (OXY IR/ROXICODONE) 5 MG immediate release tablet Take 1 tablet (5 mg total) by mouth every 6 (six) hours as needed for moderate pain or severe pain. 05/17/15   Kimberly A Stegmayer, PA-C  pantoprazole (PROTONIX) 20 MG tablet Take 1 tablet (20 mg total) by mouth daily. This replaces omeprazole 06/08/15   Arnetha Courser, MD  QUEtiapine (SEROQUEL) 25 MG tablet Take 0.5 tablets (12.5 mg total) by mouth at bedtime. 01/22/15   Arnetha Courser, MD  ranitidine (ZANTAC) 300 MG tablet Take 1 tablet (300 mg total) by mouth at bedtime. 06/08/15   Arnetha Courser, MD  rOPINIRole (REQUIP) 1 MG tablet TAKE ONE TABLET BY MOUTH THREE TIMES DAILY. 06/09/15   Arnetha Courser, MD  senna (SENOKOT) 8.6 MG TABS tablet Take 2 tablets by mouth at bedtime.     Historical Provider, MD  sertraline (ZOLOFT) 100 MG tablet Take 1.5 tablets (150 mg total) by mouth daily. 01/22/15   Arnetha Courser, MD  spironolactone (ALDACTONE) 25 MG tablet Take 25 mg by mouth daily. 08/27/14   Historical Provider, MD  SYMBICORT 160-4.5 MCG/ACT inhaler INHALE TWO PUFFS BY MOUTH TWICE DAILY . RINSE MOUTH AFTER EACH USE 09/20/14   Arnetha Courser, MD  tiotropium (SPIRIVA HANDIHALER) 18 MCG inhalation capsule Place 18 mcg into inhaler and inhale daily.    Historical Provider, MD  tiZANidine (ZANAFLEX) 2 MG tablet Take 1 tablet (2 mg total) by mouth at bedtime. 04/13/15   Alda Berthold, DO  Pulse 0  Temp(Src) 103.2 F (39.6 C) (Oral) Physical Exam  Constitutional: She is oriented to person, place, and time. She appears well-developed and well-nourished. She appears distressed.  Obese, ill-appearing  HENT:  Head: Normocephalic and atraumatic.   Mouth/Throat: Oropharynx is clear and moist. No oropharyngeal exudate.  Eyes: Conjunctivae and EOM are normal. Pupils are equal, round, and reactive to light.  Neck: Normal range of motion. Neck supple.  No meningismus.  Cardiovascular: Normal rate, regular rhythm, normal heart sounds and intact distal pulses.   No murmur heard. Pulmonary/Chest: She is in respiratory distress. She has rales.  Course rhonchi throughout Decreased breath sounds at bases  Abdominal: Soft. There is no tenderness. There is no rebound and no guarding.  Musculoskeletal: Normal range of motion. She exhibits no edema or tenderness.  TTP R anterior knee.  Flexion and extension intact. No erythema. ROM full  Neurological: She is alert and oriented to person, place, and time. No cranial nerve deficit. She exhibits normal muscle tone. Coordination normal.  Moving all extremities Oriented to person and place only  Skin: Skin is warm.  Psychiatric: She has a normal mood and affect. Her behavior is normal.  Nursing note and vitals reviewed.   ED Course  Procedures (including critical care time) Labs Review Labs Reviewed  COMPREHENSIVE METABOLIC PANEL - Abnormal; Notable for the following:    Chloride 99 (*)    Glucose, Bld 120 (*)    Total Bilirubin 1.3 (*)    GFR calc non Af Amer 56 (*)    All other components within normal limits  CBC WITH DIFFERENTIAL/PLATELET - Abnormal; Notable for the following:    WBC 16.6 (*)    RBC 3.47 (*)    Hemoglobin 8.7 (*)    HCT 29.2 (*)    MCH 25.1 (*)    MCHC 29.8 (*)    RDW 18.1 (*)    Neutro Abs 14.1 (*)    Monocytes Absolute 1.1 (*)    All other components within normal limits  URINALYSIS, ROUTINE W REFLEX MICROSCOPIC (NOT AT [facility]) - Abnormal; Notable for the following:    Protein, ur 30 (*)    All other components within normal limits  URINE MICROSCOPIC-ADD ON - Abnormal; Notable for the following:    Squamous Epithelial / LPF 0-5 (*)    Bacteria, UA RARE (*)     All other components within normal limits  CREATININE, SERUM - Abnormal; Notable for the following:    Creatinine, Ser 1.05 (*)    GFR calc non Af Amer 51 (*)    GFR calc Af Amer 60 (*)    All other components within normal limits  IRON AND TIBC - Abnormal; Notable for the following:    Iron 9 (*)    Saturation Ratios 3 (*)    All other components within normal limits  GLUCOSE, CAPILLARY - Abnormal; Notable for the following:    Glucose-Capillary 140 (*)    All other components within normal limits  I-STAT ARTERIAL BLOOD GAS, ED - Abnormal; Notable for the following:    pCO2 arterial 46.3 (*)    Bicarbonate 30.4 (*)    Acid-Base Excess 5.0 (*)    All other components within normal limits  CULTURE, BLOOD (ROUTINE X 2)  CULTURE, BLOOD (ROUTINE X 2)  URINE CULTURE  GRAM STAIN  CULTURE, EXPECTORATED SPUTUM-ASSESSMENT  STREP PNEUMONIAE URINARY ANTIGEN  TSH  FERRITIN  HIV ANTIBODY (ROUTINE TESTING)  LEGIONELLA PNEUMOPHILA SEROGP 1 UR AG  OCCULT  BLOOD X 1 CARD TO LAB, STOOL  CBC  BASIC METABOLIC PANEL  I-STAT CG4 LACTIC ACID, ED    Imaging Review Dg Chest Port 1 View  07/21/2015  CLINICAL DATA:  Acute sepsis, diabetes, atrial fibrillation EXAM: PORTABLE CHEST 1 VIEW COMPARISON:  05/23/2015, 05/18/2015 FINDINGS: Stable cardiomegaly with vascular congestion and minor basilar atelectasis. No focal pneumonia, collapse or consolidation. Negative for edema, effusion or pneumothorax. Trachea is midline. Thoracic aortic atherosclerosis noted and degenerative changes of the spine and shoulders. Monitor leads overlie the chest. IMPRESSION: Cardiomegaly with vascular congestion.  Basilar atelectasis. Thoracic aortic atherosclerosis. Electronically Signed   By: Jerilynn Mages.  Shick M.D.   On: 07/21/2015 12:22   Dg Knee Right Port  07/21/2015  CLINICAL DATA:  Right knee pain without reported injury. EXAM: PORTABLE RIGHT KNEE - 1-2 VIEW COMPARISON:  Radiographs of March 29, 2015. FINDINGS: No evidence of  fracture, dislocation, or joint effusion. Severe narrowing seen involving the medial joint space, with mild narrowing of patellofemoral space. Soft tissues are unremarkable. 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 No acute abnormality seen in the right knee. Electronically Signed   By: Marijo Conception, M.D.   On: 07/21/2015 15:38   I have personally reviewed and evaluated these images and lab results as part of my medical decision-making.   EKG Interpretation   Date/Time:  Saturday July 21 2015 11:20:39 EDT Ventricular Rate:  76 PR Interval:    QRS Duration: 85 QT Interval:  424 QTC Calculation: 477 R Axis:   4 Text Interpretation:  Sinus rhythm Ventricular premature complex Aberrant  conduction of SV complex(es) Repol abnrm suggests ischemia, lateral leads  Artifact No significant change was found Confirmed by Wyvonnia Dusky  MD, Aydn Ferrara  [phone]) on 07/21/2015 11:37:07 AM      MDM   Final diagnoses:  Sepsis, due to unspecified organism [facility])   Patient from home with altered mental status, fever, cough and shortness of breath. Code sepsis on arrival.  She is mildly confused but denies pain. States her right knee has been hurting her for several months. She is coughing up brown and yellow sputum  Lactate is normal. Febrile to 103. Cultures obtained. Chest x-ray shows vascular congestion. Patient is not hypotensive and lactate is normal so will not receive full 5ml/kg bolus  Doubt septic knee. There is full range of motion of her knee and is not erythematous. No effusion on Xray. Suspect aspiration pneumonia. Has moist cough. UA negative. Mental status improved. Antibiotics given. No significant CO2 retention on ABG.  Admission d/w hospitalist service.   CRITICAL CARE Performed by: Ezequiel Essex Total critical care time: 35 minutes Critical care time was exclusive of separately billable procedures and treating other patients. Critical care was necessary  to treat or prevent imminent or life-threatening deterioration. Critical care was time spent personally by me on the following activities: development of treatment plan with patient and/or surrogate as well as nursing, discussions with consultants, evaluation of patient's response to treatment, examination of patient, obtaining history from patient or surrogate, ordering and performing treatments and interventions, ordering and review of laboratory studies, ordering and review of radiographic studies, pulse oximetry and re-evaluation of patient's condition.   Ezequiel Essex, MD 07/21/15 1739

## 2015-07-21 NOTE — H&P (Addendum)
[facility] History and Physical  DELETHA JAFFEE BZJ:[id number] DOB: Jan 04, 1941 DOA: 07/21/2015  Referring physician: ER PCP: Enid Derry, MD   Chief Complaint: lethargy Levie Heritage  HPI: SUSSIE MINOR is a 75 y.o. female who is with her daughter with a significant past medical history of insulin-dependent diabetes, left breast cancer sp mastectomy, severe RICA stenosis sp CEA, atrial fibrillation, hypertension, coronary artery disease, COPD, chronic respiratory failure on home 2 L nasal cannula, OSA not on CPAP, hld, and morbid obesity,  presents to our ER secondary to worsening lethargy and fevers this morning.  Her daughter, who is at bedside, who gave me much of the history, patient has had  productive cough  with chest congestion for last 2 days.  Her O2 demands have been unchanged at home on  2 L oxygen at home.  Today, when daughter tried to wake up tatient, she was very somnolent, difficult to arouse and confused. The daughter found her to have a temperature of 102.8 at home.   Upon my arrival, patient's alert and oriented to place and time and able to give some history. She denies  denies orthopnea, hemoptysis.  She does c/o of right knee pain, ongoing for about 1 month, denies trauma, possible arthritis history per dgt. Per dgt, pt's mentation is much improved compared to this am.  Upon arrival in ED, pt found tachycardiac, and sepsis protocol initiated.  Pt received 2 l NS, 2gm Vancomycin, and 3.375g Zosyn.  Pt's bp cuff is in her right forearm, and sbp consistently >105.  Neg lactate levels.     Review of Systems:  Per history of present illness, otherwise all systems reviewed, essentially unremarkable except for above  Past Medical History  Diagnosis Date  . Diabetes mellitus without complication ([place])   . Atrial fibrillation ([place])   . Hypertension   . GERD (gastroesophageal reflux disease)   . CAD (coronary artery disease)   . Hypercholesteremia   . COPD  (chronic obstructive pulmonary disease) ([place])   . Morbid obesity ([place])   . Post-surgical hypothyroidism   . Depression, major ([place])   . Neuropathy ([place])   . Asthma   . Sleep apnea     "2015 wore mask; lost weight; told didn't need mask anymore" (02/23/2014)  . Type II diabetes mellitus ([place])   . Anxiety   . Anemia   . History of blood transfusion     "when I had my mastectomy"  . Chronic back pain   . Breast cancer, left breast (HCC)     S/P mastectomy  . Myocardial infarction ([place])   . Shortness of breath dyspnea   . Heart murmur   . Headache   . History of hiatal hernia   . Constipation 03/04/2015  . CHF (congestive heart failure) ([place])   . Peripheral vascular disease ([place])   . Hepatitis     HEP "C"  . Arthritis     "joints ache all over" . osteoarthritis  . DVT (deep venous thrombosis) ([place])   . Restless leg syndrome   . History of right-sided carotid endarterectomy 06/10/2015   Past Surgical History  Procedure Laterality Date  . Bartholin cyst marsupialization    . Trigger finger release Left   . Cataract extraction w/ intraocular lens  implant, bilateral Bilateral ~ 2011  . Total knee arthroplasty Left 1990's  . Total hip arthroplasty Bilateral 1990's  . Thyroidectomy, partial  1950's  . Shoulder surgery Right     "cleaned it out; no  scope"  . Toe surgery Right     "cut piece of bone out so toe didn't dig into my foot"  . Carotid endarterectomy Left 2009  . Hammer toe surgery Left     2nd and 3rd digits  . Tonsillectomy  1950's  . Mastectomy Left 1990's  . Tubal ligation    . Vaginal hysterectomy      "partial"  . Coronary angioplasty with stent placement  1990's X 2    "2; 1"  . Cardiac catheterization  2000's  . Left heart catheterization with coronary angiogram N/A 02/24/2014    Procedure: LEFT HEART CATHETERIZATION WITH CORONARY ANGIOGRAM;  Surgeon: Marykay Lex, MD;  Location: [facility] CATH LAB;  Service: Cardiovascular;  Laterality: N/A;  . Artery biopsy  Right 09/29/2014    Procedure: BIOPSY TEMPORAL ARTERY;  Surgeon: Chuck Hint, MD;  Location: [facility] OR;  Service: Vascular;  Laterality: Right;  . Esophagogastroduodenoscopy (egd) with propofol N/A 12/12/2014    Procedure: ESOPHAGOGASTRODUODENOSCOPY (EGD) WITH PROPOFOL;  Surgeon: Midge Minium, MD;  Location: [facility] ENDOSCOPY;  Service: Endoscopy;  Laterality: N/A;  . Breast biopsy Left     Mastectomy  . Eye surgery Bilateral     Catract Extraction with IOL  . Joint replacement Bilateral     Total Hip Replacement  . Joint replacement Left     Total Knee Replacement  . Endarterectomy Right 05/16/2015    Procedure: ENDARTERECTOMY CAROTID;  Surgeon: Renford Dills, MD;  Location: [facility] ORS;  Service: Vascular;  Laterality: Right;   Social History:  reports that she quit smoking about 21 years ago. Her smoking use included Cigarettes. She has a 66 pack-year smoking history. She has never used smokeless tobacco. She reports that she does not drink alcohol or use illicit drugs.  Allergies  Allergen Reactions  . Vasotec [Enalapril] Swelling    Throat swells  . Enalapril Swelling    Throat swells  . Codeine Nausea And Vomiting  . Morphine And Related Nausea And Vomiting    Family History  Problem Relation Age of Onset  . Arthritis Mother   . Cancer Mother   . Diabetes Mother   . Heart disease Mother   . Hyperlipidemia Mother   . Hypertension Mother   . Alcohol abuse Father   . Alcohol abuse Brother   . Arthritis Brother   . Asthma Brother   . HIV Brother   . Cancer Brother   . Diabetes Brother   . Hyperlipidemia Brother   . Hypertension Brother   . Lung disease Brother   . Arthritis Maternal Grandmother   . Brain cancer Father     Prior to Admission medications   Medication Sig Start Date End Date Taking? Authorizing Provider  acetaminophen (TYLENOL) 500 MG tablet Take 2 tablets (1,000 mg total) by mouth every 6 (six) hours as needed for moderate pain. 05/08/15  Yes Shaune Pollack, MD  albuterol (ACCUNEB) 0.63 MG/3ML nebulizer solution Take 1 ampule by nebulization every 6 (six) hours as needed for wheezing or shortness of breath.    Yes Historical Provider, MD  albuterol (PROAIR HFA) 108 (90 BASE) MCG/ACT inhaler Inhale 2 puffs into the lungs every 4 (four) hours as needed for wheezing or shortness of breath.    Yes Historical Provider, MD  aspirin EC 81 MG tablet Take 81 mg by mouth daily.   Yes Historical Provider, MD  atorvastatin (LIPITOR) 40 MG tablet TAKE ONE TABLET BY MOUTH AT BEDTIME. 04/11/15  Yes Melinda P  Lada, MD  bumetanide (BUMEX) 2 MG tablet 2 mg daily.  06/09/15  Yes Historical Provider, MD  busPIRone (BUSPAR) 15 MG tablet Take 1 tablet (15 mg total) by mouth 2 (two) times daily. 01/22/15  Yes Arnetha Courser, MD  carvedilol (COREG) 12.5 MG tablet Take 12.5 mg by mouth 2 (two) times daily with a meal.  01/24/14  Yes Historical Provider, MD  clopidogrel (PLAVIX) 75 MG tablet Take 1 tablet (75 mg total) by mouth daily. Patient taking differently: Take 75 mg by mouth daily. 90 day course started 06/08/15 06/08/15  Yes Arnetha Courser, MD  fluticasone (FLONASE) 50 MCG/ACT nasal spray Place 2 sprays into both nostrils at bedtime.  10/16/14  Yes Historical Provider, MD  gabapentin (NEURONTIN) 300 MG capsule Take 1 capsule (300 mg total) by mouth 2 (two) times daily. 05/04/15  Yes Arnetha Courser, MD  Hypromellose (ARTIFICIAL TEARS OP) Place 1 drop into both eyes daily.    Yes Historical Provider, MD  insulin NPH Human (HUMULIN N,NOVOLIN N) 100 UNIT/ML injection Inject 16 Units into the skin at bedtime.  06/14/15 06/13/16 Yes Historical Provider, MD  insulin regular (NOVOLIN R,HUMULIN R) 100 units/mL injection Inject 3-20 Units into the skin 3 (three) times daily before meals. Based on sliding scale   Yes Historical Provider, MD  lactulose (CHRONULAC) 10 GM/15ML solution Take 15 mLs (10 g total) by mouth 2 (two) times daily as needed for mild constipation. Patient taking  differently: Take 10 g by mouth at bedtime.  02/20/15  Yes Arnetha Courser, MD  levothyroxine (SYNTHROID, LEVOTHROID) 100 MCG tablet Take 1 tablet (100 mcg total) by mouth every other day. (alternate with the 112 mcg strength) Patient taking differently: Take 100 mcg by mouth See admin instructions. Take 1 tablet (100 mcg) by mouth before breakfast Monday thru Thursday (take 112 mcg other days) 09/08/14  Yes Arnetha Courser, MD  levothyroxine (SYNTHROID, LEVOTHROID) 112 MCG tablet Take 1 tablet (112 mcg total) by mouth every other day. (alternate with the 100 mcg strength) Patient taking differently: Take 112 mcg by mouth See admin instructions. Take 1 tablet (112 mcg) by mouth before breakfast on Friday, Saturday, Sunday (take 100 mcg other days) 09/08/14  Yes Arnetha Courser, MD  montelukast (SINGULAIR) 10 MG tablet Take 1 tablet (10 mg total) by mouth daily. Patient taking differently: Take 10 mg by mouth at bedtime.  12/07/14  Yes Arnetha Courser, MD  nitroGLYCERIN (NITROSTAT) 0.4 MG SL tablet Place 0.4 mg under the tongue every 5 (five) minutes as needed for chest pain.   Yes Historical Provider, MD  OXYGEN Inhale 2 L into the lungs continuous.   Yes Historical Provider, MD  pantoprazole (PROTONIX) 20 MG tablet Take 1 tablet (20 mg total) by mouth daily. This replaces omeprazole 06/08/15  Yes Arnetha Courser, MD  QUEtiapine (SEROQUEL) 25 MG tablet Take 0.5 tablets (12.5 mg total) by mouth at bedtime. 01/22/15  Yes Arnetha Courser, MD  ranitidine (ZANTAC) 300 MG tablet Take 1 tablet (300 mg total) by mouth at bedtime. 06/08/15  Yes Arnetha Courser, MD  rOPINIRole (REQUIP) 1 MG tablet TAKE ONE TABLET BY MOUTH THREE TIMES DAILY. 06/09/15  Yes Arnetha Courser, MD  senna (SENOKOT) 8.6 MG TABS tablet Take 1 tablet by mouth daily as needed (constipation).    Yes Historical Provider, MD  sertraline (ZOLOFT) 100 MG tablet Take 1.5 tablets (150 mg total) by mouth daily. Patient taking differently: Take 150 mg by  mouth at  bedtime.  01/22/15  Yes Arnetha Courser, MD  spironolactone (ALDACTONE) 25 MG tablet Take 25 mg by mouth daily. 08/27/14  Yes Historical Provider, MD  SYMBICORT 160-4.5 MCG/ACT inhaler INHALE TWO PUFFS BY MOUTH TWICE DAILY . RINSE MOUTH AFTER EACH USE 09/20/14  Yes Arnetha Courser, MD  tiotropium (SPIRIVA HANDIHALER) 18 MCG inhalation capsule Place 18 mcg into inhaler and inhale daily.   Yes Historical Provider, MD  tiZANidine (ZANAFLEX) 2 MG tablet Take 1 tablet (2 mg total) by mouth at bedtime. 04/13/15  Yes Donika K Patel, DO  acetylcysteine (MUCOMYST) 20 % nebulizer solution Take 2 mLs by nebulization 2 (two) times daily.  11/09/14 11/09/15  Historical Provider, MD  insulin glargine (LANTUS) 100 UNIT/ML injection Inject 0.3 mLs (30 Units total) into the skin daily. Patient not taking: Reported on 07/21/2015 09/28/14   Arnetha Courser, MD  Nerve Stimulator (STANDARD TENS) DEVI 1 Units by Does not apply route daily. 03/30/15   Arnetha Courser, MD  oxyCODONE (OXY IR/ROXICODONE) 5 MG immediate release tablet Take 1 tablet (5 mg total) by mouth every 6 (six) hours as needed for moderate pain or severe pain. Patient not taking: Reported on 07/21/2015 05/17/15   Sela Hua, PA-C   Physical Exam: Filed Vitals:   07/21/15 1345 07/21/15 1400 07/21/15 1515 07/21/15 1536  BP: 124/42 121/43 105/36   Pulse: 71 71 66   Temp:    101.1 F (38.4 C)  TempSrc:      Resp: _0 Height:      Weight:      SpO2: 99% 100% 100%     Wt Readings from Last 3 Encounters:  07/21/15 90.719 kg (200 lb)  07/12/15 90.294 kg (199 lb 1 oz)  06/08/15 87.771 kg (193 lb 8 oz)    General:  Appears calm and comfortable, pleasant, NAD, AAOx3, morbid obese Eyes: PERRL, normal lids, irises & conjunctiva ENT: grossly normal hearing, lips & tongue, mmm Neck: no LAD, masses or thyromegaly Cardiovascular: RRR, no m/r/g. No LE edema. Telemetry: SR, no arrhythmias  Respiratory: diminished diffusely, with exp wheezes at bilat  bases. No crackles. Abdomen: soft, ntnd, obese. Active bs. Skin: no rash or induration seen on limited exam Musculoskeletal: grossly normal tone BUE/BLE.  bilat knees unremarkable, no erythema/edema/nttp. Psychiatric: grossly normal mood and affect, speech fluent and appropriate Neurologic: grossly non-focal.          Labs on Admission:  Basic Metabolic Panel:  Recent Labs Lab 07/21/15 1120  NA 136  K 4.0  CL 99*  CO2 29  GLUCOSE 120*  BUN 11  CREATININE 0.98  CALCIUM 8.9   Liver Function Tests:  Recent Labs Lab 07/21/15 1120  AST 22  ALT 15  ALKPHOS 104  BILITOT 1.3*  PROT 7.2  ALBUMIN 3.6   No results for input(s): LIPASE, AMYLASE in the last 168 hours. No results for input(s): AMMONIA in the last 168 hours. CBC:  Recent Labs Lab 07/21/15 1120  WBC 16.6*  NEUTROABS 14.1*  HGB 8.7*  HCT 29.2*  MCV 84.1  PLT 189   Cardiac Enzymes: No results for input(s): CKTOTAL, CKMB, CKMBINDEX, TROPONINI in the last 168 hours.  BNP (last 3 results)  Recent Labs  10/23/14 0743  BNP 103.0*    ProBNP (last 3 results) No results for input(s): PROBNP in the last 8760 hours.  CBG: No results for input(s): GLUCAP in the last 168 hours.  Radiological Exams on Admission:  Dg Chest Port 1 View  07/21/2015  CLINICAL DATA:  Acute sepsis, diabetes, atrial fibrillation EXAM: PORTABLE CHEST 1 VIEW COMPARISON:  05/23/2015, 05/18/2015 FINDINGS: Stable cardiomegaly with vascular congestion and minor basilar atelectasis. No focal pneumonia, collapse or consolidation. Negative for edema, effusion or pneumothorax. Trachea is midline. Thoracic aortic atherosclerosis noted and degenerative changes of the spine and shoulders. Monitor leads overlie the chest. IMPRESSION: Cardiomegaly with vascular congestion.  Basilar atelectasis. Thoracic aortic atherosclerosis. Electronically Signed   By: Jerilynn Mages.  Shick M.D.   On: 07/21/2015 12:22   Dg Knee Right Port  07/21/2015  CLINICAL DATA:  Right  knee pain without reported injury. EXAM: PORTABLE RIGHT KNEE - 1-2 VIEW COMPARISON:  Radiographs of March 29, 2015. FINDINGS: No evidence of fracture, dislocation, or joint effusion. Severe narrowing seen involving the medial joint space, with mild narrowing of patellofemoral space. Soft tissues are unremarkable. 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 No acute abnormality seen in the right knee. Electronically Signed   By: Marijo Conception, M.D.   On: 07/21/2015 15:38    EKG: Independently reviewed.   EKG Interpretation  Date/Time:  Saturday July 21 2015 11:20:39 EDT Ventricular Rate:  76 PR Interval:    QRS Duration: 85 QT Interval:  424 QTC Calculation: 477 R Axis:   4 Text Interpretation:  Sinus rhythm Ventricular premature complex Aberrant conduction of SV complex(es) Repol abnrm suggests ischemia, lateral leads Artifact No significant change was found Confirmed by Wyvonnia Dusky  MD, STEPHEN ([id number]) on 07/21/2015 11:37:07 AM      Echo 02/24/14 Study Conclusions  - Left ventricle: The cavity size was normal. Wall thickness was increased in a pattern of mild LVH. The estimated ejection fraction was 60%. Regional wall motion abnormalities cannot be excluded. Doppler parameters are consistent with high ventricular filling pressure. - Aortic valve: Valve is poorly seen. There is some thickening. I suspect mild AS considering all data. Trace AI. - Left atrium: The atrium was mildly dilated. - Right ventricle: The cavity size was normal. Systolic function was normal.   Assessment/Plan Principal Problem:   Aspiration pneumonia (HCC) Active Problems:   Type 2 diabetes with nephropathy (HCC)   HTN (hypertension)   CAD S/P remote PCI    Apnea, sleep   Anemia   Sepsis (HCC)   COPD exacerbation (HCC)   Metabolic encephalopathy   Chronic respiratory failure with hypoxia ([place])   1. Sepsis on presentation w/ fevers, leukocytosis, and ams, likely due to  infectious etiology - admit to tele - pancx - sepsis protocol initiated in ED, received 2l ns, and bp remains stable - hold antitensives for now until bp normalize  2.  aspiration pneumonia vs AECOPD, w/ chronic respiratory failure  - pancx  - emp abx w/ vancomycin and zosyn, pharmacy to renal dose.  - am 2v cxr for further eval.  - duonebs scheduled, prn albuterol nebs, continue singulair  - solumedrol '60mg'$  iv x 1 now, than prednisone '40mg'$  po qday tomorw.  - emp abx zosyn/vancomcyin 3. Chronic resp failure  - currently on 2l [place], sats good  - abg w/ mixed resp acidosis and  Met alkalosis, mild. 4. metabolic encephalopathy  - per #1 and 2, already improving  5. Htn,  - soft bp currently, holding home antihypertensives - gentle ivf hydration  6. Cad, sp remote PCI - denies cp - continue asa 81, statin, plavix  7. iddm w/ neuropathy - riss for now, continue neurontin  8.  Anemia, w/ drop since  06/08/15 labs - no signs of bleeding, - chk iron studies. - chk fobt x1  9. Depression - continue zoloft,   10. Osa, not on cpap at home  11.  severe RICA stenosis sp CEA (05/16/15)  12. PAF, currently in SR.  13. History of acute diastolic chf per history, no history of chronic diastolic chf.   Code Status: full DVT Prophylaxis: lovenox 40 Family Communication: patient, daughter and granddgt at bedside Disposition Plan: inpt tele  Time spent: 57mns  DMaren ReamerMD., MBA/MHA [facility] Pager [phone]

## 2015-07-21 NOTE — ED Notes (Signed)
Per EMS staff Pt last sen normal last night by family.  Family reports fever 102.7  Pt also reports emesis.

## 2015-07-21 NOTE — Progress Notes (Signed)
Pharmacy Antibiotic Note  Bonnie Henry is a 75 y.o. female admitted on 07/21/2015 with sepsis.  Pharmacy has been consulted for Vancomycin and Zosyn dosing.  Plan: Vancomycin and Zosyn initial doses given on admission. Maintenance antibiotic plan is as follows:  Vancomycin 750mg  IV every 12 hours.  Goal trough 15-20 mcg/mL. Zosyn 3.375g IV q8h (4 hour infusion).  Monitor renal function and vancomycin troughs as appropriate. Monitor signs of clinical improvement and culture results.   Height: 5' (152.4 cm) Weight: 200 lb (90.719 kg) IBW/kg (Calculated) : 45.5  Temp (24hrs), Avg:103.2 F (39.6 C), Min:103.2 F (39.6 C), Max:103.2 F (39.6 C)   Recent Labs Lab 07/21/15 1120 07/21/15 1201  WBC 16.6*  --   CREATININE 0.98  --   LATICACIDVEN  --  1.47    Estimated Creatinine Clearance: 51.3 mL/min (by C-G formula based on Cr of 0.98).  Normalized CrCL: 56 mL/min.  Allergies  Allergen Reactions  . Vasotec [Enalapril] Swelling    Throat swells  . Enalapril Swelling    Throat swells  . Codeine Nausea And Vomiting  . Morphine And Related Nausea And Vomiting    Antimicrobials this admission: Vancomycin 2g IV x once (given as two - 1gm boluses) Vancomycin IV 750mg  IV q12h (7/15) >>  Zosyn 3.375gm IV q8h (7/15) >>   Dose adjustments this admission: N/A  Microbiology results: BCx: in process UCx: in process   Thank you for allowing pharmacy to be a part of this patient's care.  Norwood Levo [facility] 07/21/2015 2:38 PM

## 2015-07-21 NOTE — Progress Notes (Signed)
Pharmacy Antibiotic Note  Bonnie Henry is a 75 y.o. female admitted on 07/21/2015 with sepsis.  Pharmacy has been consulted for vancomycin and zosyn dosing.  Plan: - Vancomycin 1000mg  IV x 1 dose to supplement bolus given in ED.  - Determine vancomycin and Zosyn maintenance dosing once labs available to assess renal function.   Antibiotics given in ED: -Zosyn 3.375gm IV over 30 minutes x 1 dose  -Vancomycin 1000mg  IV x 1 dose   Temp (24hrs), Avg:103.2 F (39.6 C), Min:103.2 F (39.6 C), Max:103.2 F (39.6 C)  No results for input(s): WBC, CREATININE, LATICACIDVEN, VANCOTROUGH, VANCOPEAK, VANCORANDOM, GENTTROUGH, GENTPEAK, GENTRANDOM, TOBRATROUGH, TOBRAPEAK, TOBRARND, AMIKACINPEAK, AMIKACINTROU, AMIKACIN in the last 168 hours.  CrCl cannot be calculated (Patient has no serum creatinine result on file.).    Allergies  Allergen Reactions  . Vasotec [Enalapril] Swelling    Throat closes  . Enalapril Other (See Comments)    Angioedema  . Codeine Nausea And Vomiting  . Morphine And Related Nausea And Vomiting    Antimicrobials this admission: Vancomycin IV 7/15 >>  Zosyn IV 7/15 >>   Dose adjustments this admission: N/A  Lab results: 7/15 Lactic Acid: pending   Microbiology results: 7/15 BCx: pending 7/15 UCx: needs to be collected  Thank you for allowing pharmacy to be a part of this patient's care.  Demetrius Charity, PharmD Acute Care Pharmacy Resident  Pager: [phone] 07/21/2015

## 2015-07-21 NOTE — Progress Notes (Signed)
EVVIE MESIC [id number] Admission Data: 07/21/2015 4:33 PM Attending Provider: Maren Reamer, MD  [id number] Sanda Klein, MD Consults/ Treatment Team:    Bonnie Henry is a 75 y.o. female patient admitted from ED awake, alert  & orientated  X 3,  Full Code, VSS - Blood pressure 102/38, pulse 65, temperature 101.1 F (38.4 C), temperature source Oral, resp. rate 17, height 5' (1.524 m), weight 90.719 kg (200 lb), SpO2 100 %., O2    2  L nasal cannular, no c/o shortness of breath, no c/o chest pain, no distress noted. Tele #  25 placed.   IV site WDL: May refer to [facility]  Allergies:   Allergies  Allergen Reactions  . Vasotec [Enalapril] Swelling    Throat swells  . Enalapril Swelling    Throat swells  . Codeine Nausea And Vomiting  . Morphine And Related Nausea And Vomiting     Past Medical History  Diagnosis Date  . Diabetes mellitus without complication ([place])   . Atrial fibrillation ([place])   . Hypertension   . GERD (gastroesophageal reflux disease)   . CAD (coronary artery disease)   . Hypercholesteremia   . COPD (chronic obstructive pulmonary disease) ([place])   . Morbid obesity ([place])   . Post-surgical hypothyroidism   . Depression, major ([place])   . Neuropathy ([place])   . Asthma   . Sleep apnea     "2015 wore mask; lost weight; told didn't need mask anymore" (02/23/2014)  . Type II diabetes mellitus ([place])   . Anxiety   . Anemia   . History of blood transfusion     "when I had my mastectomy"  . Chronic back pain   . Breast cancer, left breast (HCC)     S/P mastectomy  . Myocardial infarction ([place])   . Shortness of breath dyspnea   . Heart murmur   . Headache   . History of hiatal hernia   . Constipation 03/04/2015  . CHF (congestive heart failure) ([place])   . Peripheral vascular disease ([place])   . Hepatitis     HEP "C"  . Arthritis     "joints ache all over" . osteoarthritis  . DVT (deep venous thrombosis) ([place])   . Restless leg syndrome   . History of  right-sided carotid endarterectomy 06/10/2015    Pt orientation to unit, room and routine. Information packet given to patient/family and safety video watched.  Admission INP armband ID verified with patient/family, and in place. SR up x 2, fall risk assessment complete with Patient and family verbalizing understanding of risks associated with falls. Pt verbalizes an understanding of how to use the call bell and to call for help before getting out of bed.  Skin, clean-dry- intact, some generalized bruising noted.  No evidence of skin break down noted on exam.     Will cont to monitor and assist as needed.  Dayle Points, RN 07/21/2015 4:33 PM

## 2015-07-21 NOTE — Progress Notes (Signed)
Pharmacy Code Sepsis Protocol  Time of code sepsis page: 11:20 [x ] Antibiotics delivered at 11:30 (spoke to RN, had antibiotics in hand)  Were antibiotics ordered at the time of the code sepsis page? Yes Was it required to contact the physician? [X]  Physician not contacted [ ]  Physician contacted to order antibiotics for code sepsis [ ]  Physician contacted to recommend changing antibiotics  Pharmacy consulted for: code sepsis  Anti-infectives    Start   Dose/Rate Route Frequency Ordered Stop   07/21/15 1130  piperacillin-tazobactam (ZOSYN) IVPB 3.375 g    3.375 g 100 mL/hr over 30 Minutes Intravenous Once 07/21/15 1119    07/21/15 1130  vancomycin (VANCOCIN) IVPB 1000 mg/200 mL premix    1,000 mg 200 mL/hr over 60 Minutes Intravenous Once 07/21/15 1119        Nurse education provided: [x]  Minutes left to administer antibiotics to achieve 1 hour goal [x]  Correct order of antibiotic administration [x]  Antibiotic Y-site compatibilities      Demetrius Charity, PharmD Acute Care Pharmacy Resident  Pager: [phone] 07/21/2015 11:34 AM

## 2015-07-21 NOTE — ED Notes (Signed)
CODE SEPSIS ACTIVATED 

## 2015-07-22 ENCOUNTER — Inpatient Hospital Stay (HOSPITAL_COMMUNITY): Payer: Medicare Other

## 2015-07-22 DIAGNOSIS — D509 Iron deficiency anemia, unspecified: Secondary | ICD-10-CM | POA: Diagnosis present

## 2015-07-22 DIAGNOSIS — A419 Sepsis, unspecified organism: Principal | ICD-10-CM

## 2015-07-22 LAB — CBC
HCT: 25.7 % — ABNORMAL LOW (ref 36.0–46.0)
Hemoglobin: 8.1 g/dL — ABNORMAL LOW (ref 12.0–15.0)
MCH: 26.5 pg (ref 26.0–34.0)
MCHC: 31.5 g/dL (ref 30.0–36.0)
MCV: 84 fL (ref 78.0–100.0)
PLATELETS: 152 10*3/uL (ref 150–400)
RBC: 3.06 MIL/uL — AB (ref 3.87–5.11)
RDW: 18.4 % — AB (ref 11.5–15.5)
WBC: 13.6 10*3/uL — AB (ref 4.0–10.5)

## 2015-07-22 LAB — GLUCOSE, CAPILLARY
GLUCOSE-CAPILLARY: 293 mg/dL — AB (ref 65–99)
GLUCOSE-CAPILLARY: 327 mg/dL — AB (ref 65–99)
GLUCOSE-CAPILLARY: 259 mg/dL — AB (ref 65–99)
Glucose-Capillary: 295 mg/dL — ABNORMAL HIGH (ref 65–99)

## 2015-07-22 LAB — BASIC METABOLIC PANEL
ANION GAP: 8 (ref 5–15)
BUN: 14 mg/dL (ref 6–20)
CALCIUM: 8.7 mg/dL — AB (ref 8.9–10.3)
CO2: 25 mmol/L (ref 22–32)
Chloride: 105 mmol/L (ref 101–111)
Creatinine, Ser: 1.03 mg/dL — ABNORMAL HIGH (ref 0.44–1.00)
GFR, EST NON AFRICAN AMERICAN: 53 mL/min — AB (ref >60)
GLUCOSE: 324 mg/dL — AB (ref 65–99)
POTASSIUM: 4.4 mmol/L (ref 3.5–5.1)
SODIUM: 138 mmol/L (ref 135–145)

## 2015-07-22 LAB — URINE CULTURE

## 2015-07-22 LAB — HIV ANTIBODY (ROUTINE TESTING W REFLEX): HIV Screen 4th Generation wRfx: NONREACTIVE

## 2015-07-22 MED ORDER — POLYETHYLENE GLYCOL 3350 17 G PO PACK: 17.0000 g | PACK | Freq: Every day | ORAL | Status: DC | PRN

## 2015-07-22 MED ORDER — INSULIN GLARGINE 100 UNIT/ML ~~LOC~~ SOLN
5.0000 [IU] | Freq: Once | SUBCUTANEOUS | Status: AC
Start: 2015-07-22 — End: 2015-07-22
  Administered 2015-07-22: 5 [IU] via SUBCUTANEOUS
  Filled 2015-07-22: qty 0.05

## 2015-07-22 MED ORDER — CARVEDILOL 12.5 MG PO TABS
12.5000 mg | ORAL_TABLET | Freq: Two times a day (BID) | ORAL | Status: DC
  Administered 2015-07-23 – 2015-07-27 (×9): 12.5 mg via ORAL
  Filled 2015-07-22 (×9): qty 1

## 2015-07-22 MED ORDER — INSULIN ASPART 100 UNIT/ML ~~LOC~~ SOLN
4.0000 [IU] | Freq: Three times a day (TID) | SUBCUTANEOUS | Status: DC
Start: 2015-07-22 — End: 2015-07-26
  Administered 2015-07-22 – 2015-07-26 (×12): 4 [IU] via SUBCUTANEOUS

## 2015-07-22 MED ORDER — INSULIN GLARGINE 100 UNIT/ML ~~LOC~~ SOLN
10.0000 [IU] | SUBCUTANEOUS | Status: DC
  Administered 2015-07-23 – 2015-07-26 (×4): 10 [IU] via SUBCUTANEOUS
  Filled 2015-07-22 (×5): qty 0.1

## 2015-07-22 MED ORDER — INSULIN GLARGINE 100 UNIT/ML ~~LOC~~ SOLN
5.0000 [IU] | SUBCUTANEOUS | Status: DC
  Administered 2015-07-22: 5 [IU] via SUBCUTANEOUS
  Filled 2015-07-22 (×2): qty 0.05

## 2015-07-22 MED ORDER — POLYETHYLENE GLYCOL 3350 17 G PO PACK
17.0000 g | PACK | Freq: Two times a day (BID) | ORAL | Status: DC
  Administered 2015-07-22 – 2015-07-27 (×6): 17 g via ORAL
  Filled 2015-07-22 (×10): qty 1

## 2015-07-22 MED ORDER — SENNOSIDES-DOCUSATE SODIUM 8.6-50 MG PO TABS
2.0000 | ORAL_TABLET | Freq: Every day | ORAL | Status: DC
  Administered 2015-07-22 – 2015-07-26 (×5): 2 via ORAL
  Filled 2015-07-22 (×5): qty 2

## 2015-07-22 MED ORDER — IRON DEXTRAN 50 MG/ML IJ SOLN
25.0000 mg | Freq: Once | INTRAMUSCULAR | Status: AC
  Administered 2015-07-22: 25 mg via INTRAVENOUS
  Filled 2015-07-22: qty 0.5

## 2015-07-22 MED ORDER — PREDNISONE 50 MG PO TABS
60.0000 mg | ORAL_TABLET | Freq: Every day | ORAL | Status: DC
  Administered 2015-07-23 – 2015-07-25 (×3): 60 mg via ORAL
  Filled 2015-07-22 (×3): qty 1

## 2015-07-22 MED ORDER — SODIUM CHLORIDE 0.9 % IV SOLN
1000.0000 mg | Freq: Once | INTRAVENOUS | Status: AC
  Administered 2015-07-22: 1000 mg via INTRAVENOUS
  Filled 2015-07-22: qty 20

## 2015-07-22 MED ORDER — FERROUS SULFATE 325 (65 FE) MG PO TABS
325.0000 mg | ORAL_TABLET | Freq: Three times a day (TID) | ORAL | Status: DC
Start: 2015-07-22 — End: 2015-07-27
  Administered 2015-07-22 – 2015-07-27 (×16): 325 mg via ORAL
  Filled 2015-07-22 (×17): qty 1

## 2015-07-22 NOTE — Progress Notes (Signed)
Pt c/o palpitation and SOB after using the bathroom, EGK done shows NSR, BP and PULSE all within normal range, pt verbalised that she feels ok after the breathing treatment, MD on call notified will continue to monitor

## 2015-07-22 NOTE — Progress Notes (Signed)
PROGRESS NOTE    Bonnie Henry  QBV:[id number] DOB: Nov 25, 1940 DOA: 07/21/2015 PCP: Enid Derry, MD    Brief Narrative:  Bonnie Henry is a 75 y.o. female who is with her daughter with a significant past medical history of insulin-dependent diabetes, left breast cancer sp mastectomy, severe RICA stenosis sp CEA, atrial fibrillation, hypertension, coronary artery disease, COPD, chronic respiratory failure on home 2 L nasal cannula, OSA not on CPAP, hld, and morbid obesity, presents to our ER secondary to worsening lethargy and fevers this morning. Her daughter, who is at bedside, who gave me much of the history, patient has had productive cough with chest congestion for last 2 days. Her O2 demands have been unchanged at home on 2 L oxygen at home. Today, when daughter tried to wake up tatient, she was very somnolent, difficult to arouse and confused. The daughter found her to have a temperature of 102.8 at home. Upon my arrival, patient's alert and oriented to place and time and able to give some history. She denies denies orthopnea, hemoptysis. She does c/o of right knee pain, ongoing for about 1 month, denies trauma, possible arthritis history per dgt. Per dgt, pt's mentation is much improved compared to this am.  Upon arrival in ED, pt found tachycardiac, and sepsis protocol initiated. Pt received 2 l NS, 2gm Vancomycin, and 3.375g Zosyn. Pt's bp cuff is in her right forearm, and sbp consistently >105. Neg lactate levels  Assessment & Plan:   Principal Problem:   Aspiration pneumonia ([place]) Active Problems:   Type 2 diabetes with nephropathy (HCC)   HTN (hypertension)   CAD S/P remote PCI    Apnea, sleep   Anemia   Sepsis ([place])   COPD exacerbation (HCC)   Metabolic encephalopathy   Chronic respiratory failure with hypoxia (HCC)   Anemia, iron deficiency   #1 sepsis secondary to aspiration pneumonia Patient was admitted and met criteria for sepsis with  fevers, leukocytosis, altered mental status felt to be secondary to aspiration pneumonia noted on chest x-ray. Patient with some clinical improvement. Patient currently afebrile. Leukocytosis trending down. Patient however on IV steroids. Continue oxygen, empiric IV vancomycin and IV Zosyn, Flonase, singular.  #2 acute COPD exacerbation Likely triggered by aspiration pneumonia. Patient with some clinical improvement however with wheezing on examination. Continue oxygen, IV steroid taper, empiric IV antibiotics, Flonase, singular, PPI, scheduled nebulizer treatments. Follow.  #3 iron deficiency anemia Patient noted to be anemic on admission. Anemia panel consistent with iron deficiency anemia. Iron level of 9. Ferritin of 23. Place on IV iron. Likely need oral iron supplementation. Continue home bowel regimen. Placed on MiraLAX daily. Follow.  #4 metabolic encephalopathy Likely secondary to problem #1 and 2. Improving.  #5 hypertension BP meds were held on admission secondary to borderline blood pressure. Resume Coreg. Intuniv to hold Bumex and Aldactone for now. Decrease IV fluid rate.  #6 coronary artery disease status post remote PCI Patient denies any chest pain currently. Continue home regimen of aspirin, statin, Plavix. Will resume Coreg. Continue to hold Bumex and Aldactone for now.  #7 insulin-dependent diabetes mellitus with neuropathy Hemoglobin A1c was 6.8 on 06/08/2015. CBGs have ranged from 258 - 327. Likely steroid induced hyperglycemia. Start Lantus to 10 units daily. Sliding scale insulin. Continue Neurontin.  #8 depression Zoloft.  #9 severe R ICA stenosis status post CEA 05/16/2015   #10 paroxysmal atrial fibrillation In normal sinus rhythm. Resume Coreg.    DVT prophylaxis: Lovenox Code Status: Full Family Communication:  Updated patient and daughter at bedside. Disposition Plan: Home when medically stable.   Consultants:   None  Procedures:   Chest x-ray  07/22/2015, 07/21/2015  Plain films of the right knee 07/21/2015  Antimicrobials:  IV vancomycin 07/21/2015  IV Zosyn 07/21/2015   Subjective: Patient more alert and afebrile this morning. Patient with some improvement with chest congestion and productive cough. Patient denies shortness of breath. Patient denies chest pain.  Objective: Filed Vitals:   07/22/15 0518 07/22/15 0728 07/22/15 1331 07/22/15 1440  BP: 129/48  163/77   Pulse: 74  75   Temp: 98.9 F (37.2 C)  98.3 F (36.8 C)   TempSrc:   Oral   Resp: 20  16   Height:      Weight:      SpO2: 98% 97% 90% 97%    Intake/Output Summary (Last 24 hours) at 07/22/15 1820 Last data filed at 07/22/15 1146  Gross per 24 hour  Intake 1070.83 ml  Output    400 ml  Net 670.83 ml   Filed Weights   07/21/15 1138 07/21/15 1700  Weight: 90.719 kg (200 lb) 90.992 kg (200 lb 9.6 oz)    Examination:  General exam: Appears calm and comfortable  Respiratory system: Expiratory wheezing. Respiratory effort normal. Cardiovascular system: S1 & S2 heard, RRR. No JVD, murmurs, rubs, gallops or clicks. No pedal edema. Gastrointestinal system: Abdomen is nondistended, soft and nontender. No organomegaly or masses felt. Normal bowel sounds heard. Central nervous system: Alert and oriented. No focal neurological deficits. Extremities: Symmetric 5 x 5 power. Skin: No rashes, lesions or ulcers Psychiatry: Judgement and insight appear normal. Mood & affect appropriate.     Data Reviewed: I have personally reviewed following labs and imaging studies  CBC:  Recent Labs Lab 07/21/15 1120 07/22/15 0430  WBC 16.6* 13.6*  NEUTROABS 14.1*  --   HGB 8.7* 8.1*  HCT 29.2* 25.7*  MCV 84.1 84.0  PLT 189 097   Basic Metabolic Panel:  Recent Labs Lab 07/21/15 1120 07/21/15 1545 07/22/15 0430  NA 136  --  138  K 4.0  --  4.4  CL 99*  --  105  CO2 29  --  25  GLUCOSE 120*  --  324*  BUN 11  --  14  CREATININE 0.98 1.05* 1.03*    CALCIUM 8.9  --  8.7*   GFR: Estimated Creatinine Clearance: 48.9 mL/min (by C-G formula based on Cr of 1.03). Liver Function Tests:  Recent Labs Lab 07/21/15 1120  AST 22  ALT 15  ALKPHOS 104  BILITOT 1.3*  PROT 7.2  ALBUMIN 3.6   No results for input(s): LIPASE, AMYLASE in the last 168 hours. No results for input(s): AMMONIA in the last 168 hours. Coagulation Profile: No results for input(s): INR, PROTIME in the last 168 hours. Cardiac Enzymes: No results for input(s): CKTOTAL, CKMB, CKMBINDEX, TROPONINI in the last 168 hours. BNP (last 3 results) No results for input(s): PROBNP in the last 8760 hours. HbA1C: No results for input(s): HGBA1C in the last 72 hours. CBG:  Recent Labs Lab 07/21/15 1707 07/21/15 2140 07/22/15 0831 07/22/15 1220 07/22/15 1628  GLUCAP 140* 258* 293* 295* 327*   Lipid Profile: No results for input(s): CHOL, HDL, LDLCALC, TRIG, CHOLHDL, LDLDIRECT in the last 72 hours. Thyroid Function Tests:  Recent Labs  07/21/15 1545  TSH 0.482   Anemia Panel:  Recent Labs  07/21/15 1545  FERRITIN 23  TIBC 333  IRON 9*  Sepsis Labs:  Recent Labs Lab 07/21/15 1201  LATICACIDVEN 1.47    Recent Results (from the past 240 hour(s))  Blood Culture (routine x 2)     Status: None (Preliminary result)   Collection Time: 07/21/15 11:20 AM  Result Value Ref Range Status   Specimen Description BLOOD RIGHT IV SITE  Final   Special Requests BOTTLES DRAWN AEROBIC AND ANAEROBIC 5CC  Final   Culture NO GROWTH 1 DAY  Final   Report Status PENDING  Incomplete  Blood Culture (routine x 2)     Status: None (Preliminary result)   Collection Time: 07/21/15 11:42 AM  Result Value Ref Range Status   Specimen Description BLOOD LEFT ANTECUBITAL  Final   Special Requests BOTTLES DRAWN AEROBIC AND ANAEROBIC 5CC  Final   Culture NO GROWTH 1 DAY  Final   Report Status PENDING  Incomplete  Urine culture     Status: Abnormal   Collection Time: 07/21/15   1:52 PM  Result Value Ref Range Status   Specimen Description URINE, CLEAN CATCH  Final   Special Requests NONE  Final   Culture <10,000 COLONIES/mL INSIGNIFICANT GROWTH (A)  Final   Report Status 07/22/2015 FINAL  Final         Radiology Studies: Dg Chest 2 View  07/22/2015  CLINICAL DATA:  Pneumonia with lethargy, fever, productive cough and chest congestion for a few days. EXAM: CHEST  2 VIEW COMPARISON:  07/21/2015 FINDINGS: AP and lateral views of the chest show hyperexpansion. Interstitial markings are diffusely coarsened with chronic features. Lateral film shows focal opacity overlying the lower spine, consistent with posterior lower lobe airspace disease. The cardio pericardial silhouette is enlarged. IMPRESSION: Posterior lower lobe airspace disease on the lateral film cannot be localized on the frontal projection. Imaging features suggest posterior lower lobe pneumonia. Electronically Signed   By: Misty Stanley M.D.   On: 07/22/2015 09:54   Dg Chest Port 1 View  07/21/2015  CLINICAL DATA:  Acute sepsis, diabetes, atrial fibrillation EXAM: PORTABLE CHEST 1 VIEW COMPARISON:  05/23/2015, 05/18/2015 FINDINGS: Stable cardiomegaly with vascular congestion and minor basilar atelectasis. No focal pneumonia, collapse or consolidation. Negative for edema, effusion or pneumothorax. Trachea is midline. Thoracic aortic atherosclerosis noted and degenerative changes of the spine and shoulders. Monitor leads overlie the chest. IMPRESSION: Cardiomegaly with vascular congestion.  Basilar atelectasis. Thoracic aortic atherosclerosis. Electronically Signed   By: Jerilynn Mages.  Shick M.D.   On: 07/21/2015 12:22   Dg Knee Right Port  07/21/2015  CLINICAL DATA:  Right knee pain without reported injury. EXAM: PORTABLE RIGHT KNEE - 1-2 VIEW COMPARISON:  Radiographs of March 29, 2015. FINDINGS: No evidence of fracture, dislocation, or joint effusion. Severe narrowing seen involving the medial joint space, with mild  narrowing of patellofemoral space. Soft tissues are unremarkable. 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 No acute abnormality seen in the right knee. Electronically Signed   By: Marijo Conception, M.D.   On: 07/21/2015 15:38        Scheduled Meds: . artificial tears   Both Eyes Daily  . aspirin EC  81 mg Oral Daily  . atorvastatin  40 mg Oral QHS  . busPIRone  15 mg Oral BID  . clopidogrel  75 mg Oral Daily  . enoxaparin (LOVENOX) injection  40 mg Subcutaneous Q24H  . famotidine  20 mg Oral BID  . ferrous sulfate  325 mg Oral TID WC  . fluticasone  2 spray Each Nare QHS  .  gabapentin  300 mg Oral BID  . insulin aspart  0-15 Units Subcutaneous TID WC  . insulin glargine  5 Units Subcutaneous BH-q7a  . ipratropium-albuterol  3 mL Nebulization Q6H  . iron dextran (INFED/DEXFERRUM) infusion  1,000 mg Intravenous Once  . [START ON 07/23/2015] levothyroxine  100 mcg Oral Once per day on Mon Tue Wed Thu  . levothyroxine  112 mcg Oral Once per day on Sun Fri Sat  . montelukast  10 mg Oral QHS  . pantoprazole  20 mg Oral Daily  . piperacillin-tazobactam (ZOSYN)  IV  3.375 g Intravenous Q8H  . polyethylene glycol  17 g Oral BID  . predniSONE  40 mg Oral Q breakfast  . QUEtiapine  12.5 mg Oral QHS  . rOPINIRole  1 mg Oral TID  . senna-docusate  2 tablet Oral QHS  . sertraline  150 mg Oral QHS  . sodium chloride flush  3 mL Intravenous Q12H  . tiZANidine  2 mg Oral QHS  . vancomycin  750 mg Intravenous Q12H   Continuous Infusions: . sodium chloride 50 mL/hr at 07/21/15 1646     LOS: 1 day    Time spent: 42 mins    Myers Tutterow, MD [facility] Pager [phone]  If 7PM-7AM, please contact night-coverage [email or web address] Password [email or web address] 07/22/2015, 6:20 PM

## 2015-07-22 NOTE — Progress Notes (Signed)
MEDICATION RELATED CONSULT NOTE - INITIAL   Pharmacy Consult for Iron Replacement Indication: Anemia  Allergies  Allergen Reactions  . Vasotec [Enalapril] Swelling    Throat swells  . Enalapril Swelling    Throat swells  . Codeine Nausea And Vomiting  . Morphine And Related Nausea And Vomiting    Patient Measurements: Height: 5' (152.4 cm) Weight: 200 lb 9.6 oz (90.992 kg) IBW/kg (Calculated) : 45.5  Vital Signs: Temp: 98.9 F (37.2 C) (07/16 0518) BP: 129/48 mmHg (07/16 0518) Pulse Rate: 74 (07/16 0518)  Labs:  Recent Labs  07/21/15 1120 07/21/15 1545 07/22/15 0430  WBC 16.6*  --  13.6*  HGB 8.7*  --  8.1*  HCT 29.2*  --  25.7*  PLT 189  --  152  CREATININE 0.98 1.05* 1.03*  ALBUMIN 3.6  --   --   PROT 7.2  --   --   AST 22  --   --   ALT 15  --   --   ALKPHOS 104  --   --   BILITOT 1.3*  --   --    Estimated Creatinine Clearance: 48.9 mL/min (by C-G formula based on Cr of 1.03).  Medications:  Scheduled:  . ferrous sulfate  325 mg Oral TID WC  . iron dextran (INFED/DEXFERRUM) infusion  25 mg Intravenous Once   Followed by  . iron dextran (INFED/DEXFERRUM) infusion  1,000 mg Intravenous Once  . senna-docusate  2 tablet Oral QHS    Assessment: Bonnie Henry is a 23 yoF admitted on 7/15 for sepsis. While admitted, her labs showed a drop in hgb since 06/08/15 and low iron stores. Iron 9 mcg/dL, iron sat 3%, hgb 8.1, therefore iron replacement is appropriate.   Plan:  - IV iron dextran 25mg  test dose, followed by 1g x 1 dose - PO iron sulfate 325mg  tid with meals - Senna-docusate for bowel regimen  Pharmacy to sign off. Thank you for allowing [place] to be involved with the care of this patient!  Belia Heman, PharmD PGY1 Pharmacy Resident [phone] (Pager) 07/22/2015 9:31 AM

## 2015-07-23 DIAGNOSIS — I10 Essential (primary) hypertension: Secondary | ICD-10-CM

## 2015-07-23 LAB — CBC
HCT: 26 % — ABNORMAL LOW (ref 36.0–46.0)
HEMOGLOBIN: 7.8 g/dL — AB (ref 12.0–15.0)
MCH: 25.4 pg — AB (ref 26.0–34.0)
MCHC: 30 g/dL (ref 30.0–36.0)
MCV: 84.7 fL (ref 78.0–100.0)
Platelets: 195 10*3/uL (ref 150–400)
RBC: 3.07 MIL/uL — AB (ref 3.87–5.11)
RDW: 18.8 % — ABNORMAL HIGH (ref 11.5–15.5)
WBC: 12.9 10*3/uL — ABNORMAL HIGH (ref 4.0–10.5)

## 2015-07-23 LAB — BLOOD CULTURE ID PANEL (REFLEXED)
ACINETOBACTER BAUMANNII: NOT DETECTED
CANDIDA GLABRATA: NOT DETECTED
CANDIDA KRUSEI: NOT DETECTED
Candida albicans: NOT DETECTED
Candida parapsilosis: NOT DETECTED
Candida tropicalis: NOT DETECTED
Carbapenem resistance: NOT DETECTED
ESCHERICHIA COLI: NOT DETECTED
Enterobacter cloacae complex: NOT DETECTED
Enterobacteriaceae species: NOT DETECTED
Enterococcus species: NOT DETECTED
Haemophilus influenzae: NOT DETECTED
KLEBSIELLA OXYTOCA: NOT DETECTED
KLEBSIELLA PNEUMONIAE: NOT DETECTED
LISTERIA MONOCYTOGENES: NOT DETECTED
Methicillin resistance: NOT DETECTED
NEISSERIA MENINGITIDIS: NOT DETECTED
Proteus species: NOT DETECTED
Pseudomonas aeruginosa: NOT DETECTED
SERRATIA MARCESCENS: NOT DETECTED
STREPTOCOCCUS AGALACTIAE: NOT DETECTED
STREPTOCOCCUS SPECIES: NOT DETECTED
Staphylococcus aureus (BCID): NOT DETECTED
Staphylococcus species: NOT DETECTED
Streptococcus pneumoniae: NOT DETECTED
Streptococcus pyogenes: NOT DETECTED
Vancomycin resistance: NOT DETECTED

## 2015-07-23 LAB — LEGIONELLA PNEUMOPHILA SEROGP 1 UR AG: L. pneumophila Serogp 1 Ur Ag: NEGATIVE

## 2015-07-23 LAB — BASIC METABOLIC PANEL
Anion gap: 5 (ref 5–15)
BUN: 14 mg/dL (ref 6–20)
CHLORIDE: 105 mmol/L (ref 101–111)
CO2: 30 mmol/L (ref 22–32)
Calcium: 9 mg/dL (ref 8.9–10.3)
Creatinine, Ser: 0.97 mg/dL (ref 0.44–1.00)
GFR calc Af Amer: >60 mL/min (ref >60)
GFR calc non Af Amer: 57 mL/min — ABNORMAL LOW (ref >60)
GLUCOSE: 197 mg/dL — AB (ref 65–99)
POTASSIUM: 4 mmol/L (ref 3.5–5.1)
Sodium: 140 mmol/L (ref 135–145)

## 2015-07-23 LAB — GLUCOSE, CAPILLARY
GLUCOSE-CAPILLARY: 232 mg/dL — AB (ref 65–99)
GLUCOSE-CAPILLARY: 166 mg/dL — AB (ref 65–99)
Glucose-Capillary: 164 mg/dL — ABNORMAL HIGH (ref 65–99)
Glucose-Capillary: 157 mg/dL — ABNORMAL HIGH (ref 65–99)

## 2015-07-23 MED ORDER — SPIRONOLACTONE 25 MG PO TABS
25.0000 mg | ORAL_TABLET | Freq: Every day | ORAL | Status: DC
  Administered 2015-07-23 – 2015-07-27 (×5): 25 mg via ORAL
  Filled 2015-07-23 (×5): qty 1

## 2015-07-23 MED ORDER — ARFORMOTEROL TARTRATE 15 MCG/2ML IN NEBU
15.0000 ug | INHALATION_SOLUTION | Freq: Two times a day (BID) | RESPIRATORY_TRACT | Status: DC
  Administered 2015-07-23 – 2015-07-27 (×8): 15 ug via RESPIRATORY_TRACT
  Filled 2015-07-23 (×8): qty 2

## 2015-07-23 MED ORDER — BUDESONIDE 0.25 MG/2ML IN SUSP
0.2500 mg | Freq: Two times a day (BID) | RESPIRATORY_TRACT | Status: DC
  Administered 2015-07-23 – 2015-07-27 (×8): 0.25 mg via RESPIRATORY_TRACT
  Filled 2015-07-23 (×8): qty 2

## 2015-07-23 MED ORDER — BUMETANIDE 2 MG PO TABS
2.0000 mg | ORAL_TABLET | Freq: Every day | ORAL | Status: DC
  Administered 2015-07-23 – 2015-07-25 (×3): 2 mg via ORAL
  Filled 2015-07-23 (×3): qty 1

## 2015-07-23 NOTE — Progress Notes (Addendum)
PHARMACY - PHYSICIAN COMMUNICATION CRITICAL VALUE ALERT - BLOOD CULTURE IDENTIFICATION (BCID)  Results for orders placed or performed during the hospital encounter of 07/21/15  Blood Culture ID Panel (Reflexed) (Collected: 07/21/2015 11:20 AM)  Result Value Ref Range   Enterococcus species NOT DETECTED NOT DETECTED   Vancomycin resistance NOT DETECTED NOT DETECTED   Listeria monocytogenes NOT DETECTED NOT DETECTED   Staphylococcus species NOT DETECTED NOT DETECTED   Staphylococcus aureus NOT DETECTED NOT DETECTED   Methicillin resistance NOT DETECTED NOT DETECTED   Streptococcus species NOT DETECTED NOT DETECTED   Streptococcus agalactiae NOT DETECTED NOT DETECTED   Streptococcus pneumoniae NOT DETECTED NOT DETECTED   Streptococcus pyogenes NOT DETECTED NOT DETECTED   Acinetobacter baumannii NOT DETECTED NOT DETECTED   Enterobacteriaceae species NOT DETECTED NOT DETECTED   Enterobacter cloacae complex NOT DETECTED NOT DETECTED   Escherichia coli NOT DETECTED NOT DETECTED   Klebsiella oxytoca NOT DETECTED NOT DETECTED   Klebsiella pneumoniae NOT DETECTED NOT DETECTED   Proteus species NOT DETECTED NOT DETECTED   Serratia marcescens NOT DETECTED NOT DETECTED   Carbapenem resistance NOT DETECTED NOT DETECTED   Haemophilus influenzae NOT DETECTED NOT DETECTED   Neisseria meningitidis NOT DETECTED NOT DETECTED   Pseudomonas aeruginosa NOT DETECTED NOT DETECTED   Candida albicans NOT DETECTED NOT DETECTED   Candida glabrata NOT DETECTED NOT DETECTED   Candida krusei NOT DETECTED NOT DETECTED   Candida parapsilosis NOT DETECTED NOT DETECTED   Candida tropicalis NOT DETECTED NOT DETECTED   75 year old female on day #3 of Vancomycin and Zosyn for aspiration pneumonia. She has 1/2 Blood cultures positive for Gram positive rods, and nothing was detected on BCID. This is likely a contaminant such as Diphtheroids and no antibiotic changes are warranted.  Name of physician (or Provider)  Contacted: Dr. Grandville Silos   Changes to prescribed antibiotics required: None currently. Could consider stopping Vancomycin for aspiration pneumonia.  Norva Riffle 07/23/2015  12:22 PM

## 2015-07-23 NOTE — Progress Notes (Signed)
PROGRESS NOTE    Bonnie Henry  OIZ:[id number] DOB: 1940/03/31 DOA: 07/21/2015 PCP: Enid Derry, MD    Brief Narrative:  Bonnie Henry is a 75 y.o. female who is with her daughter with a significant past medical history of insulin-dependent diabetes, left breast cancer sp mastectomy, severe RICA stenosis sp CEA, atrial fibrillation, hypertension, coronary artery disease, COPD, chronic respiratory failure on home 2 L nasal cannula, OSA not on CPAP, hld, and morbid obesity, presents to our ER secondary to worsening lethargy and fevers this morning. Her daughter, who is at bedside, who gave me much of the history, patient has had productive cough with chest congestion for last 2 days. Her O2 demands have been unchanged at home on 2 L oxygen at home. Today, when daughter tried to wake up tatient, she was very somnolent, difficult to arouse and confused. The daughter found her to have a temperature of 102.8 at home. Upon my arrival, patient's alert and oriented to place and time and able to give some history. She denies denies orthopnea, hemoptysis. She does c/o of right knee pain, ongoing for about 1 month, denies trauma, possible arthritis history per dgt. Per dgt, pt's mentation is much improved compared to this am.  Upon arrival in ED, pt found tachycardiac, and sepsis protocol initiated. Pt received 2 l NS, 2gm Vancomycin, and 3.375g Zosyn. Pt's bp cuff is in her right forearm, and sbp consistently >105. Neg lactate levels  Assessment & Plan:   Principal Problem:   Aspiration pneumonia ([place]) Active Problems:   Type 2 diabetes with nephropathy (HCC)   HTN (hypertension)   CAD S/P remote PCI    Apnea, sleep   Anemia   Sepsis ([place])   COPD exacerbation (HCC)   Metabolic encephalopathy   Chronic respiratory failure with hypoxia (HCC)   Anemia, iron deficiency   #1 sepsis secondary to aspiration pneumonia Patient was admitted and met criteria for sepsis with  fevers, leukocytosis, altered mental status felt to be secondary to aspiration pneumonia noted on chest x-ray. Patient with some clinical improvement. Patient currently afebrile. Leukocytosis trending down. Patient however on IV steroids. Continue oxygen, empiric IV vancomycin and IV Zosyn, Flonase, singular.  #2 acute COPD exacerbation Likely triggered by aspiration pneumonia. Patient with some clinical improvement however with wheezing on examination. Continue oxygen, IV steroid taper, empiric IV antibiotics, Flonase, singular, PPI, scheduled nebulizer treatments. Will add Pulmicort and Brovana. Chest PT. Pulmonary toilet. Follow.  #3 iron deficiency anemia Patient noted to be anemic on admission. Anemia panel consistent with iron deficiency anemia. Iron level of 9. Ferritin of 23. s/p IV iron. Continue oral iron supplementation. Continue home bowel regimen. Placed on MiraLAX daily. Follow.  #4 metabolic encephalopathy Likely secondary to problem #1 and 2. Improving.  #5 hypertension BP meds were held on admission secondary to borderline blood pressure. Resumed Coreg. Resume Bumex and Aldactone for now. Saline lock IV fluids.   #6 coronary artery disease status post remote PCI Patient denies any chest pain currently. Continue home regimen of aspirin, statin, Plavix, Coreg. Resume Bumex and Aldactone.   #7 insulin-dependent diabetes mellitus with neuropathy Hemoglobin A1c was 6.8 on 06/08/2015. CBGs have ranged from 164 - 232. Likely steroid induced hyperglycemia. Continue Lantus to 10 units daily, NovoLog meal coverage, sliding scale insulin. Continue Neurontin.  #8 depression Zoloft.  #9 severe R ICA stenosis status post CEA 05/16/2015   #10 paroxysmal atrial fibrillation In normal sinus rhythm. Resumed Coreg.    DVT prophylaxis: Lovenox Code  Status: Full Family Communication: Updated patient. No family at bedside. Disposition Plan: Home when medically stable.   Consultants:     None  Procedures:   Chest x-ray 07/22/2015, 07/21/2015  Plain films of the right knee 07/21/2015  Antimicrobials:  IV vancomycin 07/21/2015  IV Zosyn 07/21/2015   Subjective: Patient more alert and afebrile this morning. Patient with some improvement with chest congestion and productive cough. Patient with some shortness of breath. Patient denies chest pain.  Objective: Filed Vitals:   07/23/15 0213 07/23/15 0549 07/23/15 0843 07/23/15 1336  BP:  130/52  175/68  Pulse:  64 78 74  Temp:  97.4 F (36.3 C)  98.2 F (36.8 C)  TempSrc:  Oral  Oral  Resp:  18  18  Height:      Weight:      SpO2: 96% 97%  98%    Intake/Output Summary (Last 24 hours) at 07/23/15 1405 Last data filed at 07/23/15 1323  Gross per 24 hour  Intake   1040 ml  Output   1300 ml  Net   -260 ml   Filed Weights   07/21/15 1138 07/21/15 1700  Weight: 90.719 kg (200 lb) 90.992 kg (200 lb 9.6 oz)    Examination:  General exam: Appears calm and comfortable  Respiratory system: Expiratory wheezing. Respiratory effort normal. Cardiovascular system: S1 & S2 heard, RRR. No JVD, murmurs, rubs, gallops or clicks. No pedal edema. Gastrointestinal system: Abdomen is nondistended, soft and nontender. No organomegaly or masses felt. Normal bowel sounds heard. Central nervous system: Alert and oriented. No focal neurological deficits. Extremities: Symmetric 5 x 5 power. Skin: No rashes, lesions or ulcers Psychiatry: Judgement and insight appear normal. Mood & affect appropriate.     Data Reviewed: I have personally reviewed following labs and imaging studies  CBC:  Recent Labs Lab 07/21/15 1120 07/22/15 0430 07/23/15 0626  WBC 16.6* 13.6* 12.9*  NEUTROABS 14.1*  --   --   HGB 8.7* 8.1* 7.8*  HCT 29.2* 25.7* 26.0*  MCV 84.1 84.0 84.7  PLT 189 152 826   Basic Metabolic Panel:  Recent Labs Lab 07/21/15 1120 07/21/15 1545 07/22/15 0430 07/23/15 0626  NA 136  --  138 140  K 4.0  --   4.4 4.0  CL 99*  --  105 105  CO2 29  --  25 30  GLUCOSE 120*  --  324* 197*  BUN 11  --  14 14  CREATININE 0.98 1.05* 1.03* 0.97  CALCIUM 8.9  --  8.7* 9.0   GFR: Estimated Creatinine Clearance: 51.9 mL/min (by C-G formula based on Cr of 0.97). Liver Function Tests:  Recent Labs Lab 07/21/15 1120  AST 22  ALT 15  ALKPHOS 104  BILITOT 1.3*  PROT 7.2  ALBUMIN 3.6   No results for input(s): LIPASE, AMYLASE in the last 168 hours. No results for input(s): AMMONIA in the last 168 hours. Coagulation Profile: No results for input(s): INR, PROTIME in the last 168 hours. Cardiac Enzymes: No results for input(s): CKTOTAL, CKMB, CKMBINDEX, TROPONINI in the last 168 hours. BNP (last 3 results) No results for input(s): PROBNP in the last 8760 hours. HbA1C: No results for input(s): HGBA1C in the last 72 hours. CBG:  Recent Labs Lab 07/22/15 1220 07/22/15 1628 07/22/15 2043 07/23/15 0758 07/23/15 1228  GLUCAP 295* 327* 259* 164* 232*   Lipid Profile: No results for input(s): CHOL, HDL, LDLCALC, TRIG, CHOLHDL, LDLDIRECT in the last 72 hours. Thyroid Function Tests:  Recent Labs  07/21/15 1545  TSH 0.482   Anemia Panel:  Recent Labs  07/21/15 1545  FERRITIN 23  TIBC 333  IRON 9*   Sepsis Labs:  Recent Labs Lab 07/21/15 1201  LATICACIDVEN 1.47    Recent Results (from the past 240 hour(s))  Blood Culture (routine x 2)     Status: None (Preliminary result)   Collection Time: 07/21/15 11:20 AM  Result Value Ref Range Status   Specimen Description BLOOD RIGHT IV SITE  Final   Special Requests BOTTLES DRAWN AEROBIC AND ANAEROBIC 5CC  Final   Culture  Setup Time   Final    GRAM POSITIVE RODS AEROBIC BOTTLE ONLY CRITICAL RESULT CALLED TO, READ BACK BY AND VERIFIED WITH: M. TURNER, PHARMD AT 1155 ON 07/23/15 BY C. JESSUP, MLT.    Culture GRAM POSITIVE RODS  Final   Report Status PENDING  Incomplete  Blood Culture ID Panel (Reflexed)     Status: None    Collection Time: 07/21/15 11:20 AM  Result Value Ref Range Status   Enterococcus species NOT DETECTED NOT DETECTED Final   Vancomycin resistance NOT DETECTED NOT DETECTED Final   Listeria monocytogenes NOT DETECTED NOT DETECTED Final   Staphylococcus species NOT DETECTED NOT DETECTED Final   Staphylococcus aureus NOT DETECTED NOT DETECTED Final   Methicillin resistance NOT DETECTED NOT DETECTED Final   Streptococcus species NOT DETECTED NOT DETECTED Final   Streptococcus agalactiae NOT DETECTED NOT DETECTED Final   Streptococcus pneumoniae NOT DETECTED NOT DETECTED Final   Streptococcus pyogenes NOT DETECTED NOT DETECTED Final   Acinetobacter baumannii NOT DETECTED NOT DETECTED Final   Enterobacteriaceae species NOT DETECTED NOT DETECTED Final   Enterobacter cloacae complex NOT DETECTED NOT DETECTED Final   Escherichia coli NOT DETECTED NOT DETECTED Final   Klebsiella oxytoca NOT DETECTED NOT DETECTED Final   Klebsiella pneumoniae NOT DETECTED NOT DETECTED Final   Proteus species NOT DETECTED NOT DETECTED Final   Serratia marcescens NOT DETECTED NOT DETECTED Final   Carbapenem resistance NOT DETECTED NOT DETECTED Final   Haemophilus influenzae NOT DETECTED NOT DETECTED Final   Neisseria meningitidis NOT DETECTED NOT DETECTED Final   Pseudomonas aeruginosa NOT DETECTED NOT DETECTED Final   Candida albicans NOT DETECTED NOT DETECTED Final   Candida glabrata NOT DETECTED NOT DETECTED Final   Candida krusei NOT DETECTED NOT DETECTED Final   Candida parapsilosis NOT DETECTED NOT DETECTED Final   Candida tropicalis NOT DETECTED NOT DETECTED Final  Blood Culture (routine x 2)     Status: None (Preliminary result)   Collection Time: 07/21/15 11:42 AM  Result Value Ref Range Status   Specimen Description BLOOD LEFT ANTECUBITAL  Final   Special Requests BOTTLES DRAWN AEROBIC AND ANAEROBIC 5CC  Final   Culture NO GROWTH 2 DAYS  Final   Report Status PENDING  Incomplete  Urine culture      Status: Abnormal   Collection Time: 07/21/15  1:52 PM  Result Value Ref Range Status   Specimen Description URINE, CLEAN CATCH  Final   Special Requests NONE  Final   Culture <10,000 COLONIES/mL INSIGNIFICANT GROWTH (A)  Final   Report Status 07/22/2015 FINAL  Final         Radiology Studies: Dg Chest 2 View  07/22/2015  CLINICAL DATA:  Pneumonia with lethargy, fever, productive cough and chest congestion for a few days. EXAM: CHEST  2 VIEW COMPARISON:  07/21/2015 FINDINGS: AP and lateral views of the chest show hyperexpansion. Interstitial markings are  diffusely coarsened with chronic features. Lateral film shows focal opacity overlying the lower spine, consistent with posterior lower lobe airspace disease. The cardio pericardial silhouette is enlarged. IMPRESSION: Posterior lower lobe airspace disease on the lateral film cannot be localized on the frontal projection. Imaging features suggest posterior lower lobe pneumonia. Electronically Signed   By: Misty Stanley M.D.   On: 07/22/2015 09:54   Dg Knee Right Port  07/21/2015  CLINICAL DATA:  Right knee pain without reported injury. EXAM: PORTABLE RIGHT KNEE - 1-2 VIEW COMPARISON:  Radiographs of March 29, 2015. FINDINGS: No evidence of fracture, dislocation, or joint effusion. Severe narrowing seen involving the medial joint space, with mild narrowing of patellofemoral space. Soft tissues are unremarkable. 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 No acute abnormality seen in the right knee. Electronically Signed   By: Marijo Conception, M.D.   On: 07/21/2015 15:38        Scheduled Meds: . arformoterol  15 mcg Nebulization BID  . artificial tears   Both Eyes Daily  . aspirin EC  81 mg Oral Daily  . atorvastatin  40 mg Oral QHS  . budesonide (PULMICORT) nebulizer solution  0.25 mg Nebulization BID  . busPIRone  15 mg Oral BID  . carvedilol  12.5 mg Oral BID WC  . clopidogrel  75 mg Oral Daily  . enoxaparin  (LOVENOX) injection  40 mg Subcutaneous Q24H  . famotidine  20 mg Oral BID  . ferrous sulfate  325 mg Oral TID WC  . fluticasone  2 spray Each Nare QHS  . gabapentin  300 mg Oral BID  . insulin aspart  0-15 Units Subcutaneous TID WC  . insulin aspart  4 Units Subcutaneous TID WC  . insulin glargine  10 Units Subcutaneous BH-q7a  . ipratropium-albuterol  3 mL Nebulization Q6H  . levothyroxine  100 mcg Oral Once per day on Mon Tue Wed Thu  . levothyroxine  112 mcg Oral Once per day on Sun Fri Sat  . montelukast  10 mg Oral QHS  . pantoprazole  20 mg Oral Daily  . piperacillin-tazobactam (ZOSYN)  IV  3.375 g Intravenous Q8H  . polyethylene glycol  17 g Oral BID  . predniSONE  60 mg Oral Q breakfast  . QUEtiapine  12.5 mg Oral QHS  . rOPINIRole  1 mg Oral TID  . senna-docusate  2 tablet Oral QHS  . sertraline  150 mg Oral QHS  . sodium chloride flush  3 mL Intravenous Q12H  . tiZANidine  2 mg Oral QHS  . vancomycin  750 mg Intravenous Q12H   Continuous Infusions:     LOS: 2 days    Time spent: 9 mins    THOMPSON,DANIEL, MD [facility] Pager [phone]  If 7PM-7AM, please contact night-coverage [email or web address] Password [email or web address] 07/23/2015, 2:05 PM

## 2015-07-23 NOTE — Care Management Important Message (Signed)
Important Message  Patient Details  Name: Bonnie Henry MRN: [id number] Date of Birth: 1940-03-29   Medicare Important Message Given:  Yes    Nathen May 07/23/2015, 3:18 PM

## 2015-07-24 LAB — GLUCOSE, CAPILLARY
GLUCOSE-CAPILLARY: 132 mg/dL — AB (ref 65–99)
GLUCOSE-CAPILLARY: 120 mg/dL — AB (ref 65–99)
Glucose-Capillary: 123 mg/dL — ABNORMAL HIGH (ref 65–99)
Glucose-Capillary: 339 mg/dL — ABNORMAL HIGH (ref 65–99)
Glucose-Capillary: 205 mg/dL — ABNORMAL HIGH (ref 65–99)

## 2015-07-24 LAB — CBC
HEMATOCRIT: 29.2 % — AB (ref 36.0–46.0)
HEMOGLOBIN: 9.2 g/dL — AB (ref 12.0–15.0)
MCH: 26.4 pg (ref 26.0–34.0)
MCHC: 31.5 g/dL (ref 30.0–36.0)
MCV: 83.9 fL (ref 78.0–100.0)
Platelets: 191 10*3/uL (ref 150–400)
RBC: 3.48 MIL/uL — AB (ref 3.87–5.11)
RDW: 18.7 % — AB (ref 11.5–15.5)
WBC: 11.7 10*3/uL — AB (ref 4.0–10.5)

## 2015-07-24 LAB — BASIC METABOLIC PANEL
ANION GAP: 13 (ref 5–15)
BUN: 15 mg/dL (ref 6–20)
CHLORIDE: 100 mmol/L — AB (ref 101–111)
CO2: 30 mmol/L (ref 22–32)
Calcium: 9.5 mg/dL (ref 8.9–10.3)
Creatinine, Ser: 1.09 mg/dL — ABNORMAL HIGH (ref 0.44–1.00)
GFR calc Af Amer: 57 mL/min — ABNORMAL LOW (ref >60)
GFR calc non Af Amer: 49 mL/min — ABNORMAL LOW (ref >60)
GLUCOSE: 120 mg/dL — AB (ref 65–99)
POTASSIUM: 3.7 mmol/L (ref 3.5–5.1)
Sodium: 143 mmol/L (ref 135–145)

## 2015-07-24 LAB — CULTURE, BLOOD (ROUTINE X 2)

## 2015-07-24 LAB — MAGNESIUM: Magnesium: 2.1 mg/dL (ref 1.7–2.4)

## 2015-07-24 NOTE — Progress Notes (Signed)
Pharmacy Antibiotic Note Bonnie Henry is a 75 y.o. female admitted on 07/21/2015 with sepsis and concern PNA.   Plan: 1. Continue Zosyn 3.375 grams IV every 8 hours infused over 4 hours   Height: 5' (152.4 cm) Weight: 204 lb 9.4 oz (92.8 kg) IBW/kg (Calculated) : 45.5 Temp (24hrs), Avg:98.1 F (36.7 C), Min:97.6 F (36.4 C), Max:98.6 F (37 C)   Recent Labs Lab 07/21/15 1120 07/21/15 1201 07/21/15 1545 07/22/15 0430 07/23/15 0626 07/24/15 0848  WBC 16.6*  --   --  13.6* 12.9* 11.7*  CREATININE 0.98  --  1.05* 1.03* 0.97 1.09*  LATICACIDVEN  --  1.47  --   --   --   --     Estimated Creatinine Clearance: 46.7 mL/min (by C-G formula based on Cr of 1.09).    Allergies  Allergen Reactions  . Vasotec [Enalapril] Swelling    Throat swells  . Enalapril Swelling    Throat swells  . Codeine Nausea And Vomiting  . Morphine And Related Nausea And Vomiting   Antimicrobials this admission: Vancomycin IV 7/15 >> 7/18 Zosyn IV 7/15 >>    Dose adjustments this admission:  N/A   Lab results: 7/15 Lactic Acid: pending    Microbiology results: 7/15 BCx: GPR in 1/4 felt to be contaminant  7/15 UCx: ngF  Thank you for allowing pharmacy to be a part of this patient's care.  Vincenza Hews, PharmD, BCPS 07/24/2015, 10:34 AM Pager: [phone]

## 2015-07-24 NOTE — Progress Notes (Addendum)
PROGRESS NOTE    Bonnie Henry  LZJ:[id number] DOB: 1940/02/08 DOA: 07/21/2015 PCP: Enid Derry, MD    Brief Narrative:  Bonnie Henry is a 75 y.o. female who is with her daughter with a significant past medical history of insulin-dependent diabetes, left breast cancer sp mastectomy, severe RICA stenosis sp CEA, atrial fibrillation, hypertension, coronary artery disease, COPD, chronic respiratory failure on home 2 L nasal cannula, OSA not on CPAP, hld, and morbid obesity, presents to our ER secondary to worsening lethargy and fevers this morning. Her daughter, who is at bedside, who gave me much of the history, patient has had productive cough with chest congestion for last 2 days. Her O2 demands have been unchanged at home on 2 L oxygen at home. Today, when daughter tried to wake up tatient, she was very somnolent, difficult to arouse and confused. The daughter found her to have a temperature of 102.8 at home. Upon my arrival, patient's alert and oriented to place and time and able to give some history. She denies denies orthopnea, hemoptysis. She does c/o of right knee pain, ongoing for about 1 month, denies trauma, possible arthritis history per dgt. Per dgt, pt's mentation is much improved compared to this am.  Upon arrival in ED, pt found tachycardiac, and sepsis protocol initiated. Pt received 2 l NS, 2gm Vancomycin, and 3.375g Zosyn. Pt's bp cuff is in her right forearm, and sbp consistently >105. Neg lactate levels  Assessment & Plan:   Principal Problem:   Aspiration pneumonia ([place]) Active Problems:   Type 2 diabetes with nephropathy (HCC)   HTN (hypertension)   CAD S/P remote PCI    Apnea, sleep   Anemia   Sepsis ([place])   COPD exacerbation (HCC)   Metabolic encephalopathy   Chronic respiratory failure with hypoxia (HCC)   Anemia, iron deficiency   #1 sepsis secondary to aspiration pneumonia Patient was admitted and met criteria for sepsis with  fevers, leukocytosis, altered mental status felt to be secondary to aspiration pneumonia noted on chest x-ray. Patient with some clinical improvement. Patient currently afebrile. Leukocytosis trending down. Patient however on steroids. Continue oxygen, empiric IV Zosyn, Flonase, singular. Discontinue IV vancomycin.  #2 acute COPD exacerbation Likely triggered by aspiration pneumonia. Patient with some clinical improvement however with less wheezing on examination. Continue oxygen, steroid taper, empiric IV antibiotics, Flonase, singular, PPI, scheduled nebulizer treatments, Pulmicort and Brovana. Chest PT. Pulmonary toilet. Follow.  #3 iron deficiency anemia Patient noted to be anemic on admission. Anemia panel consistent with iron deficiency anemia. Iron level of 9. Ferritin of 23. s/p IV iron. Continue oral iron supplementation. Continue home bowel regimen. Placed on MiraLAX daily. Follow.  #4 metabolic encephalopathy Likely secondary to problem #1 and 2. Improving. Likely close to baseline.  #5 hypertension BP meds were held on admission secondary to borderline blood pressure. Resumed Coreg, Bumex and Aldactone for now. Saline lock IV fluids.   #6 coronary artery disease status post remote PCI Patient denies any chest pain currently. Continue home regimen of aspirin, statin, Plavix, Coreg, Bumex and Aldactone.   #7 insulin-dependent diabetes mellitus with neuropathy Hemoglobin A1c was 6.8 on 06/08/2015. CBGs have ranged from 120 - 339. Likely steroid induced hyperglycemia. Continue Lantus to 10 units daily, NovoLog meal coverage, sliding scale insulin. Continue Neurontin.  #8 depression Zoloft.  #9 severe R ICA stenosis status post CEA 05/16/2015   #10 paroxysmal atrial fibrillation In normal sinus rhythm. Continue Coreg.    DVT prophylaxis: Lovenox Code Status:  Full Family Communication: Updated patient. No family at bedside. Disposition Plan: Home when medically  stable.   Consultants:   None  Procedures:   Chest x-ray 07/22/2015, 07/21/2015  Plain films of the right knee 07/21/2015  Antimicrobials:  IV vancomycin 07/21/2015>>>> 07/24/2015  IV Zosyn 07/21/2015   Subjective: Patient more alert and afebrile this morning. Patient states no significant improvement. Patient denies chest pain.   Objective: Filed Vitals:   07/24/15 0532 07/24/15 0750 07/24/15 0753 07/24/15 0758  BP: 145/56     Pulse: 65     Temp: 97.6 F (36.4 C)     TempSrc: Oral     Resp: 16     Height:      Weight: 92.8 kg (204 lb 9.4 oz)     SpO2: 100% 99% 99% 99%    Intake/Output Summary (Last 24 hours) at 07/24/15 1317 Last data filed at 07/24/15 0952  Gross per 24 hour  Intake    120 ml  Output   2800 ml  Net  -2680 ml   Filed Weights   07/21/15 1138 07/21/15 1700 07/24/15 0532  Weight: 90.719 kg (200 lb) 90.992 kg (200 lb 9.6 oz) 92.8 kg (204 lb 9.4 oz)    Examination:  General exam: Appears calm and comfortable  Respiratory system: Decreased Expiratory wheezing. Respiratory effort normal. Cardiovascular system: S1 & S2 heard, RRR. No JVD, murmurs, rubs, gallops or clicks. No pedal edema. Gastrointestinal system: Abdomen is nondistended, soft and nontender. No organomegaly or masses felt. Normal bowel sounds heard. Central nervous system: Alert and oriented. No focal neurological deficits. Extremities: Symmetric 5 x 5 power. Skin: No rashes, lesions or ulcers Psychiatry: Judgement and insight appear normal. Mood & affect appropriate.     Data Reviewed: I have personally reviewed following labs and imaging studies  CBC:  Recent Labs Lab 07/21/15 1120 07/22/15 0430 07/23/15 0626 07/24/15 0848  WBC 16.6* 13.6* 12.9* 11.7*  NEUTROABS 14.1*  --   --   --   HGB 8.7* 8.1* 7.8* 9.2*  HCT 29.2* 25.7* 26.0* 29.2*  MCV 84.1 84.0 84.7 83.9  PLT 189 152 195 315   Basic Metabolic Panel:  Recent Labs Lab 07/21/15 1120 07/21/15 1545  07/22/15 0430 07/23/15 0626 07/24/15 0848  NA 136  --  138 140 143  K 4.0  --  4.4 4.0 3.7  CL 99*  --  105 105 100*  CO2 29  --  _0 GLUCOSE 120*  --  324* 197* 120*  BUN 11  --  _1 CREATININE 0.98 1.05* 1.03* 0.97 1.09*  CALCIUM 8.9  --  8.7* 9.0 9.5  MG  --   --   --   --  2.1   GFR: Estimated Creatinine Clearance: 46.7 mL/min (by C-G formula based on Cr of 1.09). Liver Function Tests:  Recent Labs Lab 07/21/15 1120  AST 22  ALT 15  ALKPHOS 104  BILITOT 1.3*  PROT 7.2  ALBUMIN 3.6   No results for input(s): LIPASE, AMYLASE in the last 168 hours. No results for input(s): AMMONIA in the last 168 hours. Coagulation Profile: No results for input(s): INR, PROTIME in the last 168 hours. Cardiac Enzymes: No results for input(s): CKTOTAL, CKMB, CKMBINDEX, TROPONINI in the last 168 hours. BNP (last 3 results) No results for input(s): PROBNP in the last 8760 hours. HbA1C: No results for input(s): HGBA1C in the last 72 hours. CBG:  Recent Labs Lab 07/23/15 1624 07/23/15 2201 07/24/15  0535 07/24/15 0759 07/24/15 1205  GLUCAP 166* 157* 123* 132* 120*   Lipid Profile: No results for input(s): CHOL, HDL, LDLCALC, TRIG, CHOLHDL, LDLDIRECT in the last 72 hours. Thyroid Function Tests:  Recent Labs  07/21/15 1545  TSH 0.482   Anemia Panel:  Recent Labs  07/21/15 1545  FERRITIN 23  TIBC 333  IRON 9*   Sepsis Labs:  Recent Labs Lab 07/21/15 1201  LATICACIDVEN 1.47    Recent Results (from the past 240 hour(s))  Blood Culture (routine x 2)     Status: Abnormal   Collection Time: 07/21/15 11:20 AM  Result Value Ref Range Status   Specimen Description BLOOD RIGHT IV SITE  Final   Special Requests BOTTLES DRAWN AEROBIC AND ANAEROBIC 5CC  Final   Culture  Setup Time   Final    GRAM POSITIVE RODS AEROBIC BOTTLE ONLY CRITICAL RESULT CALLED TO, READ BACK BY AND VERIFIED WITH: M. TURNER, PHARMD AT 1155 ON 07/23/15 BY C. JESSUP, MLT.    Culture  (A)  Final    DIPHTHEROIDS(CORYNEBACTERIUM SPECIES) Standardized susceptibility testing for this organism is not available.    Report Status 07/24/2015 FINAL  Final  Blood Culture ID Panel (Reflexed)     Status: None   Collection Time: 07/21/15 11:20 AM  Result Value Ref Range Status   Enterococcus species NOT DETECTED NOT DETECTED Final   Vancomycin resistance NOT DETECTED NOT DETECTED Final   Listeria monocytogenes NOT DETECTED NOT DETECTED Final   Staphylococcus species NOT DETECTED NOT DETECTED Final   Staphylococcus aureus NOT DETECTED NOT DETECTED Final   Methicillin resistance NOT DETECTED NOT DETECTED Final   Streptococcus species NOT DETECTED NOT DETECTED Final   Streptococcus agalactiae NOT DETECTED NOT DETECTED Final   Streptococcus pneumoniae NOT DETECTED NOT DETECTED Final   Streptococcus pyogenes NOT DETECTED NOT DETECTED Final   Acinetobacter baumannii NOT DETECTED NOT DETECTED Final   Enterobacteriaceae species NOT DETECTED NOT DETECTED Final   Enterobacter cloacae complex NOT DETECTED NOT DETECTED Final   Escherichia coli NOT DETECTED NOT DETECTED Final   Klebsiella oxytoca NOT DETECTED NOT DETECTED Final   Klebsiella pneumoniae NOT DETECTED NOT DETECTED Final   Proteus species NOT DETECTED NOT DETECTED Final   Serratia marcescens NOT DETECTED NOT DETECTED Final   Carbapenem resistance NOT DETECTED NOT DETECTED Final   Haemophilus influenzae NOT DETECTED NOT DETECTED Final   Neisseria meningitidis NOT DETECTED NOT DETECTED Final   Pseudomonas aeruginosa NOT DETECTED NOT DETECTED Final   Candida albicans NOT DETECTED NOT DETECTED Final   Candida glabrata NOT DETECTED NOT DETECTED Final   Candida krusei NOT DETECTED NOT DETECTED Final   Candida parapsilosis NOT DETECTED NOT DETECTED Final   Candida tropicalis NOT DETECTED NOT DETECTED Final  Blood Culture (routine x 2)     Status: None (Preliminary result)   Collection Time: 07/21/15 11:42 AM  Result Value Ref  Range Status   Specimen Description BLOOD LEFT ANTECUBITAL  Final   Special Requests BOTTLES DRAWN AEROBIC AND ANAEROBIC 5CC  Final   Culture NO GROWTH 2 DAYS  Final   Report Status PENDING  Incomplete  Urine culture     Status: Abnormal   Collection Time: 07/21/15  1:52 PM  Result Value Ref Range Status   Specimen Description URINE, CLEAN CATCH  Final   Special Requests NONE  Final   Culture <10,000 COLONIES/mL INSIGNIFICANT GROWTH (A)  Final   Report Status 07/22/2015 FINAL  Final  Radiology Studies: No results found.      Scheduled Meds: . arformoterol  15 mcg Nebulization BID  . artificial tears   Both Eyes Daily  . aspirin EC  81 mg Oral Daily  . atorvastatin  40 mg Oral QHS  . budesonide (PULMICORT) nebulizer solution  0.25 mg Nebulization BID  . bumetanide  2 mg Oral Daily  . busPIRone  15 mg Oral BID  . carvedilol  12.5 mg Oral BID WC  . clopidogrel  75 mg Oral Daily  . enoxaparin (LOVENOX) injection  40 mg Subcutaneous Q24H  . famotidine  20 mg Oral BID  . ferrous sulfate  325 mg Oral TID WC  . fluticasone  2 spray Each Nare QHS  . gabapentin  300 mg Oral BID  . insulin aspart  0-15 Units Subcutaneous TID WC  . insulin aspart  4 Units Subcutaneous TID WC  . insulin glargine  10 Units Subcutaneous BH-q7a  . ipratropium-albuterol  3 mL Nebulization Q6H  . levothyroxine  100 mcg Oral Once per day on Mon Tue Wed Thu  . levothyroxine  112 mcg Oral Once per day on Sun Fri Sat  . montelukast  10 mg Oral QHS  . pantoprazole  20 mg Oral Daily  . piperacillin-tazobactam (ZOSYN)  IV  3.375 g Intravenous Q8H  . polyethylene glycol  17 g Oral BID  . predniSONE  60 mg Oral Q breakfast  . QUEtiapine  12.5 mg Oral QHS  . rOPINIRole  1 mg Oral TID  . senna-docusate  2 tablet Oral QHS  . sertraline  150 mg Oral QHS  . sodium chloride flush  3 mL Intravenous Q12H  . spironolactone  25 mg Oral Daily  . tiZANidine  2 mg Oral QHS   Continuous Infusions:      LOS: 3 days    Time spent: 64 mins    THOMPSON,DANIEL, MD [facility] Pager [phone]  If 7PM-7AM, please contact night-coverage [email or web address] Password [email or web address] 07/24/2015, 1:17 PM

## 2015-07-25 DIAGNOSIS — J441 Chronic obstructive pulmonary disease with (acute) exacerbation: Secondary | ICD-10-CM

## 2015-07-25 DIAGNOSIS — D509 Iron deficiency anemia, unspecified: Secondary | ICD-10-CM

## 2015-07-25 DIAGNOSIS — J690 Pneumonitis due to inhalation of food and vomit: Secondary | ICD-10-CM

## 2015-07-25 LAB — BASIC METABOLIC PANEL
ANION GAP: 14 (ref 5–15)
BUN: 20 mg/dL (ref 6–20)
CALCIUM: 8.9 mg/dL (ref 8.9–10.3)
CHLORIDE: 96 mmol/L — AB (ref 101–111)
CO2: 30 mmol/L (ref 22–32)
Creatinine, Ser: 1.35 mg/dL — ABNORMAL HIGH (ref 0.44–1.00)
GFR calc non Af Amer: 38 mL/min — ABNORMAL LOW (ref >60)
GFR, EST AFRICAN AMERICAN: 44 mL/min — AB (ref >60)
Glucose, Bld: 127 mg/dL — ABNORMAL HIGH (ref 65–99)
Potassium: 3.3 mmol/L — ABNORMAL LOW (ref 3.5–5.1)
Sodium: 140 mmol/L (ref 135–145)

## 2015-07-25 LAB — GLUCOSE, CAPILLARY
GLUCOSE-CAPILLARY: 221 mg/dL — AB (ref 65–99)
GLUCOSE-CAPILLARY: 232 mg/dL — AB (ref 65–99)
Glucose-Capillary: 137 mg/dL — ABNORMAL HIGH (ref 65–99)
Glucose-Capillary: 294 mg/dL — ABNORMAL HIGH (ref 65–99)

## 2015-07-25 LAB — CBC
HEMATOCRIT: 29 % — AB (ref 36.0–46.0)
HEMOGLOBIN: 9 g/dL — AB (ref 12.0–15.0)
MCH: 25.9 pg — ABNORMAL LOW (ref 26.0–34.0)
MCHC: 31 g/dL (ref 30.0–36.0)
MCV: 83.6 fL (ref 78.0–100.0)
Platelets: 229 10*3/uL (ref 150–400)
RBC: 3.47 MIL/uL — AB (ref 3.87–5.11)
RDW: 18.7 % — AB (ref 11.5–15.5)
WBC: 11.4 10*3/uL — AB (ref 4.0–10.5)

## 2015-07-25 MED ORDER — METHYLPREDNISOLONE SODIUM SUCC 40 MG IJ SOLR
40.0000 mg | Freq: Two times a day (BID) | INTRAMUSCULAR | Status: DC
  Administered 2015-07-25 – 2015-07-26 (×2): 40 mg via INTRAVENOUS
  Filled 2015-07-25: qty 1

## 2015-07-25 MED ORDER — INSULIN ASPART 100 UNIT/ML ~~LOC~~ SOLN
4.0000 [IU] | Freq: Once | SUBCUTANEOUS | Status: AC
  Administered 2015-07-25: 4 [IU] via SUBCUTANEOUS

## 2015-07-25 NOTE — Progress Notes (Signed)
PROGRESS NOTE    Bonnie Henry  RCV:[id number] DOB: 07/18/1940 DOA: 07/21/2015 PCP: Enid Derry, MD    Brief Narrative:  Bonnie Henry is a 75 y.o. Female with a significant past medical history of insulin-dependent diabetes, left breast cancer sp mastectomy, severe RICA stenosis sp CEA, atrial fibrillation, hypertension, coronary artery disease, COPD, chronic respiratory failure on home 2 L nasal cannula, OSA not on CPAP,  and morbid obesity, presents to our ER secondary to worsening lethargy and fevers . Her daughter,, who gave much of the history report , patient has had productive cough with chest congestion for last 2 days. Her O2 demands have been unchanged at home on 2 L oxygen at home. Today, when daughter tried to wake up tatient, she was very somnolent, difficult to arouse and confused. The daughter found her to have a temperature of 102.8 at home.   Upon arrival in ED, pt found tachycardiac, and sepsis protocol initiated. Pt received 2 l NS, 2gm Vancomycin, and 3.375g Zosyn. Pt's bp cuff is in her right forearm, and sbp consistently >105. Neg lactate levels  Assessment & Plan:   Principal Problem:   Aspiration pneumonia ([place]) Active Problems:   Type 2 diabetes with nephropathy (HCC)   HTN (hypertension)   CAD S/P remote PCI    Apnea, sleep   Anemia   Sepsis ([place])   COPD exacerbation (HCC)   Metabolic encephalopathy   Chronic respiratory failure with hypoxia (HCC)   Anemia, iron deficiency   1 sepsis secondary to aspiration pneumonia Patient was admitted and met criteria for sepsis with fevers, leukocytosis, altered mental status felt to be secondary to aspiration pneumonia noted on chest x-ray. Patient with some clinical improvement. Patient currently afebrile. Leukocytosis trending down. Patient however on steroids. Strep pneumonia negative, legionella negative  Continue oxygen, empiric IV Zosyn, Flonase, singular. Discontinue IV vancomycin. Speech  to do swallow evaluation.   2 acute COPD exacerbation Likely triggered by aspiration pneumonia. Patient with some clinical improvement however with less wheezing on examination. Continue oxygen, steroid taper, empiric IV antibiotics, Flonase, singular, PPI, scheduled nebulizer treatments, Pulmicort and Brovana. Chest PT. Pulmonary toilet. Follow. Will change prednisone to IV steroids. Patient with significant wheezing.   3 iron deficiency anemia Patient noted to be anemic on admission. Anemia panel consistent with iron deficiency anemia. Iron level of 9. Ferritin of 23. s/p IV iron. Continue oral iron supplementation. Continue home bowel regimen. Placed on MiraLAX daily. Follow. Hb improved to 9.   4 metabolic encephalopathy Likely secondary to problem #1 and 2. Improving. Likely close to baseline.  5 hypertension BP meds were held on admission secondary to borderline blood pressure.  Resumed Coreg, hold bumex  Continue with spironolactone, but monitor renal function.   6 coronary artery disease status post remote PCI Patient denies any chest pain currently. Continue home regimen of aspirin, statin, Plavix, Coreg, hold Bumex and continue  Aldactone.   7 insulin-dependent diabetes mellitus with neuropathy Hemoglobin A1c was 6.8 on 06/08/2015. CBGs have ranged from 120 - 339. Likely steroid induced hyperglycemia. Continue Lantus to 10 units daily, NovoLog meal coverage, sliding scale insulin. Continue Neurontin.  8 depression Zoloft.  9 severe R ICA stenosis status post CEA 05/16/2015   10 paroxysmal atrial fibrillation In normal sinus rhythm. Continue Coreg.  Blood culture positive for diphtheroids likely contaminant .    DVT prophylaxis: Lovenox Code Status: Full Family Communication: Updated patient. And daughter who was at bedside.  Disposition Plan: Home when medically stable.  Consultants:   None  Procedures:   Chest x-ray 07/22/2015, 07/21/2015  Plain films of  the right knee 07/21/2015  Antimicrobials:  IV vancomycin 07/21/2015>>>> 07/24/2015  IV Zosyn 07/21/2015   Subjective: Patient alert and conversant. Report feeling better. Coughing more today and has bilateral wheezing   Objective: Filed Vitals:   07/25/15 0810 07/25/15 0856 07/25/15 1349 07/25/15 1503  BP:  136/51  139/34  Pulse:  65  63  Temp:    98.7 F (37.1 C)  TempSrc:    Oral  Resp:    20  Height:      Weight:      SpO2: 98%  98% 97%    Intake/Output Summary (Last 24 hours) at 07/25/15 1641 Last data filed at 07/25/15 0930  Gross per 24 hour  Intake    540 ml  Output    800 ml  Net   -260 ml   Filed Weights   07/21/15 1700 07/24/15 0532 07/25/15 0537  Weight: 90.992 kg (200 lb 9.6 oz) 92.8 kg (204 lb 9.4 oz) 87.227 kg (192 lb 4.8 oz)    Examination:  General exam: Appears calm and comfortable  Respiratory system: bilateral  Expiratory wheezing. Respiratory effort normal. Cardiovascular system: S1 & S2 heard, RRR. No JVD, murmurs, rubs, gallops or clicks. No pedal edema. Gastrointestinal system: Abdomen is nondistended, soft and nontender. No organomegaly or masses felt. Normal bowel sounds heard. Central nervous system: Alert and oriented. No focal neurological deficits. Extremities: Symmetric 5 x 5 power. Skin: No rashes, lesions or ulcers Psychiatry: Judgement and insight appear normal. Mood & affect appropriate.     Data Reviewed: I have personally reviewed following labs and imaging studies  CBC:  Recent Labs Lab 07/21/15 1120 07/22/15 0430 07/23/15 0626 07/24/15 0848 07/25/15 0558  WBC 16.6* 13.6* 12.9* 11.7* 11.4*  NEUTROABS 14.1*  --   --   --   --   HGB 8.7* 8.1* 7.8* 9.2* 9.0*  HCT 29.2* 25.7* 26.0* 29.2* 29.0*  MCV 84.1 84.0 84.7 83.9 83.6  PLT 189 152 195 191 161   Basic Metabolic Panel:  Recent Labs Lab 07/21/15 1120 07/21/15 1545 07/22/15 0430 07/23/15 0626 07/24/15 0848 07/25/15 0558  NA 136  --  138 140 143 140  K  4.0  --  4.4 4.0 3.7 3.3*  CL 99*  --  105 105 100* 96*  CO2 29  --  _0 GLUCOSE 120*  --  324* 197* 120* 127*  BUN 11  --  _1 CREATININE 0.98 1.05* 1.03* 0.97 1.09* 1.35*  CALCIUM 8.9  --  8.7* 9.0 9.5 8.9  MG  --   --   --   --  2.1  --    GFR: Estimated Creatinine Clearance: 36.4 mL/min (by C-G formula based on Cr of 1.35). Liver Function Tests:  Recent Labs Lab 07/21/15 1120  AST 22  ALT 15  ALKPHOS 104  BILITOT 1.3*  PROT 7.2  ALBUMIN 3.6   No results for input(s): LIPASE, AMYLASE in the last 168 hours. No results for input(s): AMMONIA in the last 168 hours. Coagulation Profile: No results for input(s): INR, PROTIME in the last 168 hours. Cardiac Enzymes: No results for input(s): CKTOTAL, CKMB, CKMBINDEX, TROPONINI in the last 168 hours. BNP (last 3 results) No results for input(s): PROBNP in the last 8760 hours. HbA1C: No results for input(s): HGBA1C in the last 72 hours. CBG:  Recent Labs Lab 07/24/15  1205 07/24/15 1708 07/24/15 2201 07/25/15 0808 07/25/15 1205  GLUCAP 120* 339* 205* 137* 221*   Lipid Profile: No results for input(s): CHOL, HDL, LDLCALC, TRIG, CHOLHDL, LDLDIRECT in the last 72 hours. Thyroid Function Tests: No results for input(s): TSH, T4TOTAL, FREET4, T3FREE, THYROIDAB in the last 72 hours. Anemia Panel: No results for input(s): VITAMINB12, FOLATE, FERRITIN, TIBC, IRON, RETICCTPCT in the last 72 hours. Sepsis Labs:  Recent Labs Lab 07/21/15 1201  LATICACIDVEN 1.47    Recent Results (from the past 240 hour(s))  Blood Culture (routine x 2)     Status: Abnormal   Collection Time: 07/21/15 11:20 AM  Result Value Ref Range Status   Specimen Description BLOOD RIGHT IV SITE  Final   Special Requests BOTTLES DRAWN AEROBIC AND ANAEROBIC 5CC  Final   Culture  Setup Time   Final    GRAM POSITIVE RODS AEROBIC BOTTLE ONLY CRITICAL RESULT CALLED TO, READ BACK BY AND VERIFIED WITH: M. TURNER, PHARMD AT 1155 ON 07/23/15 BY  C. JESSUP, MLT.    Culture (A)  Final    DIPHTHEROIDS(CORYNEBACTERIUM SPECIES) Standardized susceptibility testing for this organism is not available.    Report Status 07/24/2015 FINAL  Final  Blood Culture ID Panel (Reflexed)     Status: None   Collection Time: 07/21/15 11:20 AM  Result Value Ref Range Status   Enterococcus species NOT DETECTED NOT DETECTED Final   Vancomycin resistance NOT DETECTED NOT DETECTED Final   Listeria monocytogenes NOT DETECTED NOT DETECTED Final   Staphylococcus species NOT DETECTED NOT DETECTED Final   Staphylococcus aureus NOT DETECTED NOT DETECTED Final   Methicillin resistance NOT DETECTED NOT DETECTED Final   Streptococcus species NOT DETECTED NOT DETECTED Final   Streptococcus agalactiae NOT DETECTED NOT DETECTED Final   Streptococcus pneumoniae NOT DETECTED NOT DETECTED Final   Streptococcus pyogenes NOT DETECTED NOT DETECTED Final   Acinetobacter baumannii NOT DETECTED NOT DETECTED Final   Enterobacteriaceae species NOT DETECTED NOT DETECTED Final   Enterobacter cloacae complex NOT DETECTED NOT DETECTED Final   Escherichia coli NOT DETECTED NOT DETECTED Final   Klebsiella oxytoca NOT DETECTED NOT DETECTED Final   Klebsiella pneumoniae NOT DETECTED NOT DETECTED Final   Proteus species NOT DETECTED NOT DETECTED Final   Serratia marcescens NOT DETECTED NOT DETECTED Final   Carbapenem resistance NOT DETECTED NOT DETECTED Final   Haemophilus influenzae NOT DETECTED NOT DETECTED Final   Neisseria meningitidis NOT DETECTED NOT DETECTED Final   Pseudomonas aeruginosa NOT DETECTED NOT DETECTED Final   Candida albicans NOT DETECTED NOT DETECTED Final   Candida glabrata NOT DETECTED NOT DETECTED Final   Candida krusei NOT DETECTED NOT DETECTED Final   Candida parapsilosis NOT DETECTED NOT DETECTED Final   Candida tropicalis NOT DETECTED NOT DETECTED Final  Blood Culture (routine x 2)     Status: None (Preliminary result)   Collection Time: 07/21/15  11:42 AM  Result Value Ref Range Status   Specimen Description BLOOD LEFT ANTECUBITAL  Final   Special Requests BOTTLES DRAWN AEROBIC AND ANAEROBIC 5CC  Final   Culture NO GROWTH 4 DAYS  Final   Report Status PENDING  Incomplete  Urine culture     Status: Abnormal   Collection Time: 07/21/15  1:52 PM  Result Value Ref Range Status   Specimen Description URINE, CLEAN CATCH  Final   Special Requests NONE  Final   Culture <10,000 COLONIES/mL INSIGNIFICANT GROWTH (A)  Final   Report Status 07/22/2015 FINAL  Final  Radiology Studies: No results found.      Scheduled Meds: . arformoterol  15 mcg Nebulization BID  . artificial tears   Both Eyes Daily  . aspirin EC  81 mg Oral Daily  . atorvastatin  40 mg Oral QHS  . budesonide (PULMICORT) nebulizer solution  0.25 mg Nebulization BID  . bumetanide  2 mg Oral Daily  . busPIRone  15 mg Oral BID  . carvedilol  12.5 mg Oral BID WC  . clopidogrel  75 mg Oral Daily  . enoxaparin (LOVENOX) injection  40 mg Subcutaneous Q24H  . famotidine  20 mg Oral BID  . ferrous sulfate  325 mg Oral TID WC  . fluticasone  2 spray Each Nare QHS  . gabapentin  300 mg Oral BID  . insulin aspart  0-15 Units Subcutaneous TID WC  . insulin aspart  4 Units Subcutaneous TID WC  . insulin glargine  10 Units Subcutaneous BH-q7a  . ipratropium-albuterol  3 mL Nebulization Q6H  . levothyroxine  100 mcg Oral Once per day on Mon Tue Wed Thu  . levothyroxine  112 mcg Oral Once per day on Sun Fri Sat  . methylPREDNISolone (SOLU-MEDROL) injection  40 mg Intravenous Q12H  . montelukast  10 mg Oral QHS  . pantoprazole  20 mg Oral Daily  . piperacillin-tazobactam (ZOSYN)  IV  3.375 g Intravenous Q8H  . polyethylene glycol  17 g Oral BID  . QUEtiapine  12.5 mg Oral QHS  . rOPINIRole  1 mg Oral TID  . senna-docusate  2 tablet Oral QHS  . sertraline  150 mg Oral QHS  . sodium chloride flush  3 mL Intravenous Q12H  . spironolactone  25 mg Oral Daily  .  tiZANidine  2 mg Oral QHS   Continuous Infusions:     LOS: 4 days    Time spent: 35 mins    Elmarie Shiley, MD [facility] Pager [phone]  If 7PM-7AM, please contact night-coverage [email or web address] Password [email or web address] 07/25/2015, 4:41 PM

## 2015-07-25 NOTE — Progress Notes (Signed)
Patient HS cbg is 294. No scheduled HS insulin. Provider on call Raliegh Ip Schorr, np) was notified. Pt's is asymptomatic. Will continue to monitor patient.

## 2015-07-26 LAB — GLUCOSE, CAPILLARY
GLUCOSE-CAPILLARY: 310 mg/dL — AB (ref 65–99)
Glucose-Capillary: 339 mg/dL — ABNORMAL HIGH (ref 65–99)
Glucose-Capillary: 145 mg/dL — ABNORMAL HIGH (ref 65–99)

## 2015-07-26 LAB — BASIC METABOLIC PANEL
Anion gap: 8 (ref 5–15)
BUN: 26 mg/dL — AB (ref 6–20)
CALCIUM: 9.1 mg/dL (ref 8.9–10.3)
CO2: 36 mmol/L — AB (ref 22–32)
CREATININE: 1.23 mg/dL — AB (ref 0.44–1.00)
Chloride: 94 mmol/L — ABNORMAL LOW (ref 101–111)
GFR calc Af Amer: 49 mL/min — ABNORMAL LOW (ref >60)
GFR calc non Af Amer: 42 mL/min — ABNORMAL LOW (ref >60)
GLUCOSE: 210 mg/dL — AB (ref 65–99)
Potassium: 3.8 mmol/L (ref 3.5–5.1)
Sodium: 138 mmol/L (ref 135–145)

## 2015-07-26 LAB — CULTURE, BLOOD (ROUTINE X 2): CULTURE: NO GROWTH

## 2015-07-26 MED ORDER — METHYLPREDNISOLONE SODIUM SUCC 40 MG IJ SOLR
40.0000 mg | Freq: Three times a day (TID) | INTRAMUSCULAR | Status: DC
Start: 2015-07-26 — End: 2015-07-27
  Administered 2015-07-26 – 2015-07-27 (×3): 40 mg via INTRAVENOUS
  Filled 2015-07-26 (×3): qty 1

## 2015-07-26 MED ORDER — INSULIN GLARGINE 100 UNIT/ML ~~LOC~~ SOLN
15.0000 [IU] | Freq: Every day | SUBCUTANEOUS | Status: DC
  Administered 2015-07-27: 15 [IU] via SUBCUTANEOUS
  Filled 2015-07-26: qty 0.15

## 2015-07-26 MED ORDER — POTASSIUM CHLORIDE CRYS ER 20 MEQ PO TBCR
20.0000 meq | EXTENDED_RELEASE_TABLET | Freq: Once | ORAL | Status: AC
  Administered 2015-07-26: 20 meq via ORAL
  Filled 2015-07-26: qty 1

## 2015-07-26 MED ORDER — INSULIN GLARGINE 100 UNIT/ML ~~LOC~~ SOLN
5.0000 [IU] | Freq: Once | SUBCUTANEOUS | Status: AC
  Administered 2015-07-26: 5 [IU] via SUBCUTANEOUS
  Filled 2015-07-26: qty 0.05

## 2015-07-26 MED ORDER — INSULIN ASPART 100 UNIT/ML ~~LOC~~ SOLN
6.0000 [IU] | Freq: Three times a day (TID) | SUBCUTANEOUS | Status: DC
  Administered 2015-07-26 – 2015-07-27 (×3): 6 [IU] via SUBCUTANEOUS

## 2015-07-26 MED ORDER — PANTOPRAZOLE SODIUM 20 MG PO TBEC
20.0000 mg | DELAYED_RELEASE_TABLET | Freq: Two times a day (BID) | ORAL | Status: DC
  Administered 2015-07-26 – 2015-07-27 (×2): 20 mg via ORAL
  Filled 2015-07-26 (×2): qty 1

## 2015-07-26 NOTE — Evaluation (Signed)
Clinical/Bedside Swallow Evaluation Patient Details  Name: Bonnie Henry MRN: [id number] Date of Birth: 1940/10/04  Today's Date: 07/26/2015 Time: SLP Start Time (ACUTE ONLY): 0914 SLP Stop Time (ACUTE ONLY): 0930 SLP Time Calculation (min) (ACUTE ONLY): 16 min  Past Medical History:  Past Medical History  Diagnosis Date  . Diabetes mellitus without complication ([place])   . Atrial fibrillation ([place])   . Hypertension   . GERD (gastroesophageal reflux disease)   . CAD (coronary artery disease)   . Hypercholesteremia   . COPD (chronic obstructive pulmonary disease) ([place])   . Morbid obesity ([place])   . Post-surgical hypothyroidism   . Depression, major ([place])   . Neuropathy ([place])   . Asthma   . Sleep apnea     "2015 wore mask; lost weight; told didn't need mask anymore" (02/23/2014)  . Type II diabetes mellitus ([place])   . Anxiety   . Anemia   . History of blood transfusion     "when I had my mastectomy"  . Chronic back pain   . Breast cancer, left breast (HCC)     S/P mastectomy  . Myocardial infarction ([place])   . Shortness of breath dyspnea   . Heart murmur   . Headache   . History of hiatal hernia   . Constipation 03/04/2015  . CHF (congestive heart failure) ([place])   . Peripheral vascular disease ([place])   . Hepatitis     HEP "C"  . Arthritis     "joints ache all over" . osteoarthritis  . DVT (deep venous thrombosis) ([place])   . Restless leg syndrome   . History of right-sided carotid endarterectomy 06/10/2015   Past Surgical History:  Past Surgical History  Procedure Laterality Date  . Bartholin cyst marsupialization    . Trigger finger release Left   . Cataract extraction w/ intraocular lens  implant, bilateral Bilateral ~ 2011  . Total knee arthroplasty Left 1990's  . Total hip arthroplasty Bilateral 1990's  . Thyroidectomy, partial  1950's  . Shoulder surgery Right     "cleaned it out; no scope"  . Toe surgery Right     "cut piece of bone out so toe didn't dig into  my foot"  . Carotid endarterectomy Left 2009  . Hammer toe surgery Left     2nd and 3rd digits  . Tonsillectomy  1950's  . Mastectomy Left 1990's  . Tubal ligation    . Vaginal hysterectomy      "partial"  . Coronary angioplasty with stent placement  1990's X 2    "2; 1"  . Cardiac catheterization  2000's  . Left heart catheterization with coronary angiogram N/A 02/24/2014    Procedure: LEFT HEART CATHETERIZATION WITH CORONARY ANGIOGRAM;  Surgeon: Leonie Man, MD;  Location: [facility] CATH LAB;  Service: Cardiovascular;  Laterality: N/A;  . Artery biopsy Right 09/29/2014    Procedure: BIOPSY TEMPORAL ARTERY;  Surgeon: Angelia Mould, MD;  Location: [facility] OR;  Service: Vascular;  Laterality: Right;  . Esophagogastroduodenoscopy (egd) with propofol N/A 12/12/2014    Procedure: ESOPHAGOGASTRODUODENOSCOPY (EGD) WITH PROPOFOL;  Surgeon: Lucilla Lame, MD;  Location: [facility] ENDOSCOPY;  Service: Endoscopy;  Laterality: N/A;  . Breast biopsy Left     Mastectomy  . Eye surgery Bilateral     Catract Extraction with IOL  . Joint replacement Bilateral     Total Hip Replacement  . Joint replacement Left     Total Knee Replacement  . Endarterectomy Right 05/16/2015  Procedure: ENDARTERECTOMY CAROTID;  Surgeon: Katha Cabal, MD;  Location: [facility] ORS;  Service: Vascular;  Laterality: Right;   HPI:  Bonnie Henry is a 75 y.o. Female with a significant past medical history of insulin-dependent diabetes, left breast cancer sp mastectomy, severe RICA stenosis sp CEA, atrial fibrillation, hypertension, coronary artery disease, COPD, chronic respiratory failure on home 2 L nasal cannula, OSA not on CPAP, esophageal stricture with dilatation, and morbid obesity, presents with worsening lethargy and fevers . Her daughter,, who gave much of the history report , patient has had productive cough with chest congestion for last 2 days, somnolent, temp.  CXR shows posterior lower lobe pneumonia which MD dx  as aspiration pna.    Assessment / Plan / Recommendation Clinical Impression  Pt demosntrates with signs of a normal oral and oropharyngeal swallow. She reprots frequent globus in her chest, a feeling that solid foods do not pass well and a feeling of regurgitation at night. She verbalized awareness of trigger foods and avoids these and also independently follows bites with sips. She had EGD in december of 2016 with dilation, but was told this was not going to help much longer. Pt is not sure if she is taking medication for reflux, but her report raises concern for nocturnal reflux or postprandial reflux. SLP reviewed esophageal precautions. Defer further medical management to MD. No futher SLP interventions needed at this time.     Aspiration Risk  Moderate aspiration risk    Diet Recommendation Regular;Thin liquid   Liquid Administration via: Cup;Straw Medication Administration: Whole meds with puree Supervision: Patient able to self feed Compensations: Slow rate;Small sips/bites;Follow solids with liquid Postural Changes: Seated upright at 90 degrees;Remain upright for at least 30 minutes after po intake    Other  Recommendations Oral Care Recommendations: Oral care BID   Follow up Recommendations  None    Frequency and Duration            Prognosis        Swallow Study   General HPI: Bonnie Henry is a 75 y.o. Female with a significant past medical history of insulin-dependent diabetes, left breast cancer sp mastectomy, severe RICA stenosis sp CEA, atrial fibrillation, hypertension, coronary artery disease, COPD, chronic respiratory failure on home 2 L nasal cannula, OSA not on CPAP, esophageal stricture with dilatation, and morbid obesity, presents with worsening lethargy and fevers . Her daughter,, who gave much of the history report , patient has had productive cough with chest congestion for last 2 days, somnolent, temp.  CXR shows posterior lower lobe pneumonia  which MD dx as aspiration pna.  Type of Study: Bedside Swallow Evaluation Diet Prior to this Study: Regular;Thin liquids Temperature Spikes Noted: No Respiratory Status: Nasal cannula History of Recent Intubation: No Behavior/Cognition: Alert;Cooperative;Pleasant mood Oral Cavity Assessment: Within Functional Limits Oral Care Completed by SLP: No Oral Cavity - Dentition: Dentures, top;Dentures, bottom Vision: Functional for self-feeding Self-Feeding Abilities: Able to feed self Patient Positioning: Partially reclined (SLP repositioned) Baseline Vocal Quality: Normal Volitional Cough: Strong Volitional Swallow: Able to elicit    Oral/Motor/Sensory Function Overall Oral Motor/Sensory Function: Within functional limits   Ice Chips     Thin Liquid Thin Liquid: Within functional limits    Nectar Thick Nectar Thick Liquid: Not tested   Honey Thick Honey Thick Liquid: Not tested   Puree Puree: Within functional limits   Solid   GO   Solid: Within functional limits Presentation: Self Fed  Herbie [place], [place] CCC-SLP [phone]  Leontae Bostock, Katherene Ponto 07/26/2015,9:47 AM

## 2015-07-26 NOTE — Evaluation (Signed)
Physical Therapy Evaluation Patient Details Name: Bonnie Henry MRN: [id number] DOB: [birth year] Today's Date: 07/26/2015   History of Present Illness  Pt adm with sepsis due to aspiration PNA. PMH - copd, dm , htn, breast CA, cad, copd  Clinical Impression  Pt able to amb in hallway with rollator. Pt close to baseline. Recommend continued amb in halls with staff/family to manage lines/tubes. No further PT recommended.    Follow Up Recommendations No PT follow up    Equipment Recommendations  None recommended by PT    Recommendations for Other Services       Precautions / Restrictions Precautions Precautions: Fall Restrictions Weight Bearing Restrictions: No      Mobility  Bed Mobility Overal bed mobility: Modified Independent                Transfers Overall transfer level: Modified independent Equipment used: None;4-wheeled walker             General transfer comment: sit to stand and bed to bsc  Ambulation/Gait Ambulation/Gait assistance: Supervision Ambulation Distance (Feet): 200 Feet Assistive device: 4-wheeled walker Gait Pattern/deviations: Step-through pattern;Decreased stride length;Trunk flexed Gait velocity: decr Gait velocity interpretation: Below normal speed for age/gender General Gait Details: Verbal cues to stand more erect. Assist with lines/tubes  Stairs            Wheelchair Mobility    Modified Rankin (Stroke Patients Only)       Balance Overall balance assessment: Needs assistance Sitting-balance support: No upper extremity supported;Feet supported Sitting balance-Leahy Scale: Good     Standing balance support: No upper extremity supported Standing balance-Leahy Scale: Fair                               Pertinent Vitals/Pain      Home Living Family/patient expects to be discharged to:: Private residence Living Arrangements: Children;Other relatives;Other (Comment) Available Help at  Discharge: Family Type of Home: Mobile home Home Access: Stairs to enter Entrance Stairs-Rails: Right Entrance Stairs-Number of Steps: 6 Home Layout: One level Home Equipment: Clinical cytogeneticist - 2 wheels;Walker - 4 wheels;Cane - quad;Bedside commode      Prior Function Level of Independence: Needs assistance   Gait / Transfers Assistance Needed: Ambulates in home with RW, and in community with rollator. Ambulates limited community distances and uses motorized scooter at stores           Hand Dominance   Dominant Hand: Right    Extremity/Trunk Assessment   Upper Extremity Assessment: Generalized weakness           Lower Extremity Assessment: Generalized weakness         Communication   Communication: No difficulties  Cognition Arousal/Alertness: Awake/alert Behavior During Therapy: WFL for tasks assessed/performed Overall Cognitive Status: Within Functional Limits for tasks assessed                      General Comments      Exercises        Assessment/Plan    PT Assessment [place] does not need any further PT services  PT Diagnosis Generalized weakness;Difficulty walking   PT Problem List    PT Treatment Interventions     PT Goals (Current goals can be found in the Care Plan section) Acute Rehab PT Goals PT Goal Formulation: All assessment and education complete, DC therapy    Frequency     Barriers to discharge  Co-evaluation               End of Session Equipment Utilized During Treatment: Oxygen Activity Tolerance: Patient tolerated treatment well Patient left: in bed;with call bell/phone within reach           Time: 1332-1359 PT Time Calculation (min) (ACUTE ONLY): 27 min   Charges:   PT Evaluation $PT Eval Low Complexity: 1 Procedure PT Treatments $Gait Training: 8-22 mins   PT G Codes:        Dejean Tribby 2015/08/08, 2:59 PM [facility] PT [phone]

## 2015-07-26 NOTE — Care Management Important Message (Signed)
Important Message  Patient Details  Name: Bonnie Henry MRN: [id number] Date of Birth: 03/06/1940   Medicare Important Message Given:  Yes    Deshaun Weisinger Abena 07/26/2015, 11:06 AM

## 2015-07-26 NOTE — Progress Notes (Signed)
PROGRESS NOTE    Bonnie Henry  [id number] DOB: Mar 10, 1940 DOA: 07/21/2015 PCP: Enid Derry, MD    Brief Narrative:  Bonnie Henry is a 75 y.o. Female with a significant past medical history of insulin-dependent diabetes, left breast cancer sp mastectomy, severe RICA stenosis sp CEA, atrial fibrillation, hypertension, coronary artery disease, COPD, chronic respiratory failure on home 2 L nasal cannula, OSA not on CPAP,  and morbid obesity, presents to our ER secondary to worsening lethargy and fevers . Her daughter,, who gave much of the history report , patient has had productive cough with chest congestion for last 2 days. Her O2 demands have been unchanged at home on 2 L oxygen at home. Today, when daughter tried to wake up tatient, she was very somnolent, difficult to arouse and confused. The daughter found her to have a temperature of 102.8 at home.   Upon arrival in ED, pt found tachycardiac, and sepsis protocol initiated. Pt received 2 l NS, 2gm Vancomycin, and 3.375g Zosyn. Pt's bp cuff is in her right forearm, and sbp consistently >105. Neg lactate levels  Assessment & Plan:   Principal Problem:   Aspiration pneumonia ([place]) Active Problems:   Type 2 diabetes with nephropathy (HCC)   HTN (hypertension)   CAD S/P remote PCI    Apnea, sleep   Anemia   Sepsis ([place])   COPD exacerbation (HCC)   Metabolic encephalopathy   Chronic respiratory failure with hypoxia (HCC)   Anemia, iron deficiency   1 Sepsis secondary to aspiration pneumonia Patient was admitted and met criteria for sepsis with fevers, leukocytosis, altered mental status felt to be secondary to aspiration pneumonia noted on chest x-ray. Patient with some clinical improvement. Patient currently afebrile. Leukocytosis trending down. Patient however on steroids. Strep pneumonia negative, legionella negative  Continue oxygen, empiric IV Zosyn, Flonase, singular. Discontinue IV vancomycin  7-19. Speech to do swallow evaluation, esophageal dysphagia. Small frequent meals. Increase PPI to BID. Needs to follow with primary GI Dr.    2 Acute COPD exacerbation Likely triggered by aspiration pneumonia. Patient with some clinical improvement however with less wheezing on examination. Continue oxygen, steroid taper, empiric IV antibiotics, Flonase, singular, PPI, scheduled nebulizer treatments, Pulmicort and Brovana. Chest PT. Pulmonary toilet. Follow. Continue with IV steroids, increase to TID. Still with wheezing.   3 Iron deficiency anemia Patient noted to be anemic on admission. Anemia panel consistent with iron deficiency anemia. Iron level of 9. Ferritin of 23. s/p IV iron. Continue oral iron supplementation. Continue home bowel regimen. Placed on MiraLAX daily. Follow. Hb improved to 9.  Repeat labs in am.   4 Metabolic encephalopathy Likely secondary to problem #1 and 2. Improving. Likely close to baseline.  5 hypertension BP meds were held on admission secondary to borderline blood pressure.  Resumed Coreg, hold bumex  Continue with spironolactone, but monitor renal function.   6 coronary artery disease status post remote PCI Patient denies any chest pain currently. Continue home regimen of aspirin, statin, Plavix, Coreg, hold Bumex and continue  Aldactone.   7 insulin-dependent diabetes mellitus with neuropathy Hemoglobin A1c was 6.8 on 06/08/2015. CBGs have ranged from 120 - 339. Likely steroid induced hyperglycemia. Continue Lantus to 10 units daily, NovoLog meal coverage, sliding scale insulin. Continue Neurontin. Increase meals coverage while on IV steroids.   8 depression Zoloft.  9 severe R ICA stenosis status post CEA 05/16/2015   10 paroxysmal atrial fibrillation In normal sinus rhythm. Continue Coreg.  11-AKI; improved. Continue  to hold Bumex.   Blood culture positive for diphtheroids likely contaminant .    DVT prophylaxis: Lovenox Code Status:  Full Family Communication: Updated patient. And daughter who was at bedside.  Disposition Plan: Home when medically stable.   Consultants:   None  Procedures:   Chest x-ray 07/22/2015, 07/21/2015  Plain films of the right knee 07/21/2015  Antimicrobials:  IV vancomycin 07/21/2015>>>> 07/24/2015  IV Zosyn 07/21/2015   Subjective: Patient alert and conversant. Breathing the same, still with wheezing.   Objective: Filed Vitals:   07/25/15 2222 07/26/15 0248 07/26/15 0655 07/26/15 0732  BP:   177/67   Pulse:   69   Temp:   98.3 F (36.8 C)   TempSrc:   Oral   Resp:   16   Height:      Weight: 87.122 kg (192 lb 1.1 oz)     SpO2: 97% 100% 95% 91%    Intake/Output Summary (Last 24 hours) at 07/26/15 1310 Last data filed at 07/26/15 1016  Gross per 24 hour  Intake    240 ml  Output      0 ml  Net    240 ml   Filed Weights   07/24/15 0532 07/25/15 0537 07/25/15 2222  Weight: 92.8 kg (204 lb 9.4 oz) 87.227 kg (192 lb 4.8 oz) 87.122 kg (192 lb 1.1 oz)    Examination:  General exam: Appears calm and comfortable  Respiratory system: bilateral  Expiratory wheezing. Respiratory effort normal. Cardiovascular system: S1 & S2 heard, RRR. No JVD, murmurs, rubs, gallops or clicks. No pedal edema. Gastrointestinal system: Abdomen is nondistended, soft and nontender. No organomegaly or masses felt. Normal bowel sounds heard. Central nervous system: Alert and oriented. No focal neurological deficits. Extremities: Symmetric 5 x 5 power. Skin: No rashes, lesions or ulcers Psychiatry: Judgement and insight appear normal. Mood & affect appropriate.     Data Reviewed: I have personally reviewed following labs and imaging studies  CBC:  Recent Labs Lab 07/21/15 1120 07/22/15 0430 07/23/15 0626 07/24/15 0848 07/25/15 0558  WBC 16.6* 13.6* 12.9* 11.7* 11.4*  NEUTROABS 14.1*  --   --   --   --   HGB 8.7* 8.1* 7.8* 9.2* 9.0*  HCT 29.2* 25.7* 26.0* 29.2* 29.0*  MCV 84.1  84.0 84.7 83.9 83.6  PLT 189 152 195 191 628   Basic Metabolic Panel:  Recent Labs Lab 07/22/15 0430 07/23/15 0626 07/24/15 0848 07/25/15 0558 07/26/15 0551  NA 138 140 143 140 138  K 4.4 4.0 3.7 3.3* 3.8  CL 105 105 100* 96* 94*  CO2 25 30 30 30  36*  GLUCOSE 324* 197* 120* 127* 210*  BUN 14 14 15 20  26*  CREATININE 1.03* 0.97 1.09* 1.35* 1.23*  CALCIUM 8.7* 9.0 9.5 8.9 9.1  MG  --   --  2.1  --   --    GFR: Estimated Creatinine Clearance: 39.9 mL/min (by C-G formula based on Cr of 1.23). Liver Function Tests:  Recent Labs Lab 07/21/15 1120  AST 22  ALT 15  ALKPHOS 104  BILITOT 1.3*  PROT 7.2  ALBUMIN 3.6   No results for input(s): LIPASE, AMYLASE in the last 168 hours. No results for input(s): AMMONIA in the last 168 hours. Coagulation Profile: No results for input(s): INR, PROTIME in the last 168 hours. Cardiac Enzymes: No results for input(s): CKTOTAL, CKMB, CKMBINDEX, TROPONINI in the last 168 hours. BNP (last 3 results) No results for input(s): PROBNP in the last 8760  hours. HbA1C: No results for input(s): HGBA1C in the last 72 hours. CBG:  Recent Labs Lab 07/25/15 0808 07/25/15 1205 07/25/15 1707 07/25/15 2147 07/26/15 1155  GLUCAP 137* 221* 232* 294* 339*   Lipid Profile: No results for input(s): CHOL, HDL, LDLCALC, TRIG, CHOLHDL, LDLDIRECT in the last 72 hours. Thyroid Function Tests: No results for input(s): TSH, T4TOTAL, FREET4, T3FREE, THYROIDAB in the last 72 hours. Anemia Panel: No results for input(s): VITAMINB12, FOLATE, FERRITIN, TIBC, IRON, RETICCTPCT in the last 72 hours. Sepsis Labs:  Recent Labs Lab 07/21/15 1201  LATICACIDVEN 1.47    Recent Results (from the past 240 hour(s))  Blood Culture (routine x 2)     Status: Abnormal   Collection Time: 07/21/15 11:20 AM  Result Value Ref Range Status   Specimen Description BLOOD RIGHT IV SITE  Final   Special Requests BOTTLES DRAWN AEROBIC AND ANAEROBIC 5CC  Final   Culture   Setup Time   Final    GRAM POSITIVE RODS AEROBIC BOTTLE ONLY CRITICAL RESULT CALLED TO, READ BACK BY AND VERIFIED WITH: M. TURNER, PHARMD AT 1155 ON 07/23/15 BY C. JESSUP, MLT.    Culture (A)  Final    DIPHTHEROIDS(CORYNEBACTERIUM SPECIES) Standardized susceptibility testing for this organism is not available.    Report Status 07/24/2015 FINAL  Final  Blood Culture ID Panel (Reflexed)     Status: None   Collection Time: 07/21/15 11:20 AM  Result Value Ref Range Status   Enterococcus species NOT DETECTED NOT DETECTED Final   Vancomycin resistance NOT DETECTED NOT DETECTED Final   Listeria monocytogenes NOT DETECTED NOT DETECTED Final   Staphylococcus species NOT DETECTED NOT DETECTED Final   Staphylococcus aureus NOT DETECTED NOT DETECTED Final   Methicillin resistance NOT DETECTED NOT DETECTED Final   Streptococcus species NOT DETECTED NOT DETECTED Final   Streptococcus agalactiae NOT DETECTED NOT DETECTED Final   Streptococcus pneumoniae NOT DETECTED NOT DETECTED Final   Streptococcus pyogenes NOT DETECTED NOT DETECTED Final   Acinetobacter baumannii NOT DETECTED NOT DETECTED Final   Enterobacteriaceae species NOT DETECTED NOT DETECTED Final   Enterobacter cloacae complex NOT DETECTED NOT DETECTED Final   Escherichia coli NOT DETECTED NOT DETECTED Final   Klebsiella oxytoca NOT DETECTED NOT DETECTED Final   Klebsiella pneumoniae NOT DETECTED NOT DETECTED Final   Proteus species NOT DETECTED NOT DETECTED Final   Serratia marcescens NOT DETECTED NOT DETECTED Final   Carbapenem resistance NOT DETECTED NOT DETECTED Final   Haemophilus influenzae NOT DETECTED NOT DETECTED Final   Neisseria meningitidis NOT DETECTED NOT DETECTED Final   Pseudomonas aeruginosa NOT DETECTED NOT DETECTED Final   Candida albicans NOT DETECTED NOT DETECTED Final   Candida glabrata NOT DETECTED NOT DETECTED Final   Candida krusei NOT DETECTED NOT DETECTED Final   Candida parapsilosis NOT DETECTED NOT  DETECTED Final   Candida tropicalis NOT DETECTED NOT DETECTED Final  Blood Culture (routine x 2)     Status: None (Preliminary result)   Collection Time: 07/21/15 11:42 AM  Result Value Ref Range Status   Specimen Description BLOOD LEFT ANTECUBITAL  Final   Special Requests BOTTLES DRAWN AEROBIC AND ANAEROBIC 5CC  Final   Culture NO GROWTH 4 DAYS  Final   Report Status PENDING  Incomplete  Urine culture     Status: Abnormal   Collection Time: 07/21/15  1:52 PM  Result Value Ref Range Status   Specimen Description URINE, CLEAN CATCH  Final   Special Requests NONE  Final  Culture <10,000 COLONIES/mL INSIGNIFICANT GROWTH (A)  Final   Report Status 07/22/2015 FINAL  Final         Radiology Studies: No results found.      Scheduled Meds: . arformoterol  15 mcg Nebulization BID  . artificial tears   Both Eyes Daily  . aspirin EC  81 mg Oral Daily  . atorvastatin  40 mg Oral QHS  . budesonide (PULMICORT) nebulizer solution  0.25 mg Nebulization BID  . busPIRone  15 mg Oral BID  . carvedilol  12.5 mg Oral BID WC  . clopidogrel  75 mg Oral Daily  . enoxaparin (LOVENOX) injection  40 mg Subcutaneous Q24H  . famotidine  20 mg Oral BID  . ferrous sulfate  325 mg Oral TID WC  . fluticasone  2 spray Each Nare QHS  . gabapentin  300 mg Oral BID  . insulin aspart  0-15 Units Subcutaneous TID WC  . insulin aspart  6 Units Subcutaneous TID WC  . insulin glargine  10 Units Subcutaneous BH-q7a  . ipratropium-albuterol  3 mL Nebulization Q6H  . levothyroxine  100 mcg Oral Once per day on Mon Tue Wed Thu  . levothyroxine  112 mcg Oral Once per day on Sun Fri Sat  . methylPREDNISolone (SOLU-MEDROL) injection  40 mg Intravenous Q8H  . montelukast  10 mg Oral QHS  . pantoprazole  20 mg Oral BID  . piperacillin-tazobactam (ZOSYN)  IV  3.375 g Intravenous Q8H  . polyethylene glycol  17 g Oral BID  . QUEtiapine  12.5 mg Oral QHS  . rOPINIRole  1 mg Oral TID  . senna-docusate  2 tablet  Oral QHS  . sertraline  150 mg Oral QHS  . sodium chloride flush  3 mL Intravenous Q12H  . spironolactone  25 mg Oral Daily  . tiZANidine  2 mg Oral QHS   Continuous Infusions:     LOS: 5 days    Time spent: 35 mins    Elmarie Shiley, MD [facility] Pager [phone]  If 7PM-7AM, please contact night-coverage [email or web address] Password [email or web address] 07/26/2015, 1:10 PM

## 2015-07-26 NOTE — Progress Notes (Signed)
Inpatient Diabetes Program Recommendations  AACE/ADA: New Consensus Statement on Inpatient Glycemic Control (2015)  Target Ranges:  Prepandial:   less than 140 mg/dL      Peak postprandial:   less than 180 mg/dL (1-2 hours)      Critically ill patients:  140 - 180 mg/dL   Results for ELYSSE, FRANZEL (MRN [id number]) as of 07/26/2015 10:32  Ref. Range 07/24/2015 22:01 07/25/2015 08:08 07/25/2015 12:05 07/25/2015 17:07 07/25/2015 21:47  Glucose-Capillary Latest Ref Range: 65-99 mg/dL 205 (H) 137 (H) 221 (H) 232 (H) 294 (H)   Inpatient Diabetes Program Recommendations:  Noted postprandial CBGs elevated. Please consider increase in meal coverage to Novolog 6 units tid if eats > 50%.  Thank you, Nani Gasser. Junice Fei, RN, MSN, CDE Inpatient Glycemic Control Team Team Pager [phone] (8am-5pm) 07/26/2015 10:33 AM

## 2015-07-27 LAB — BASIC METABOLIC PANEL
ANION GAP: 7 (ref 5–15)
BUN: 23 mg/dL — AB (ref 6–20)
CALCIUM: 9 mg/dL (ref 8.9–10.3)
CO2: 32 mmol/L (ref 22–32)
CREATININE: 1.12 mg/dL — AB (ref 0.44–1.00)
Chloride: 99 mmol/L — ABNORMAL LOW (ref 101–111)
GFR calc Af Amer: 55 mL/min — ABNORMAL LOW (ref >60)
GFR calc non Af Amer: 48 mL/min — ABNORMAL LOW (ref >60)
GLUCOSE: 294 mg/dL — AB (ref 65–99)
Potassium: 4.6 mmol/L (ref 3.5–5.1)
Sodium: 138 mmol/L (ref 135–145)

## 2015-07-27 LAB — GLUCOSE, CAPILLARY
Glucose-Capillary: 283 mg/dL — ABNORMAL HIGH (ref 65–99)
Glucose-Capillary: 410 mg/dL — ABNORMAL HIGH (ref 65–99)

## 2015-07-27 LAB — HEMOGLOBIN AND HEMATOCRIT, BLOOD
HCT: 28.9 % — ABNORMAL LOW (ref 36.0–46.0)
HEMOGLOBIN: 8.9 g/dL — AB (ref 12.0–15.0)

## 2015-07-27 MED ORDER — IPRATROPIUM-ALBUTEROL 0.5-2.5 (3) MG/3ML IN SOLN: 3.0000 mL | Freq: Four times a day (QID) | RESPIRATORY_TRACT | Status: DC

## 2015-07-27 MED ORDER — PREDNISONE 20 MG PO TABS: ORAL_TABLET | ORAL | Status: DC

## 2015-07-27 MED ORDER — AMOXICILLIN-POT CLAVULANATE 875-125 MG PO TABS: 1.0000 | ORAL_TABLET | Freq: Two times a day (BID) | ORAL | Status: DC

## 2015-07-27 MED ORDER — FERROUS SULFATE 325 (65 FE) MG PO TABS: 325.0000 mg | ORAL_TABLET | Freq: Three times a day (TID) | ORAL | Status: DC

## 2015-07-27 MED ORDER — PROBIOTIC 250 MG PO CAPS: 250.0000 mg | ORAL_CAPSULE | Freq: Two times a day (BID) | ORAL | Status: DC

## 2015-07-27 MED ORDER — PANTOPRAZOLE SODIUM 20 MG PO TBEC: 20.0000 mg | DELAYED_RELEASE_TABLET | Freq: Two times a day (BID) | ORAL | Status: DC

## 2015-07-27 NOTE — Discharge Summary (Addendum)
Physician Discharge Summary  Bonnie Henry QBH:[id number] DOB: 12-03-1940 DOA: 07/21/2015  PCP: Enid Derry, MD  Admit date: 07/21/2015 Discharge date: 07/27/2015  Admitted From: home  Disposition: home   Recommendations for Outpatient Follow-up:  1. Follow up with PCP in 1-2 weeks 2. Please obtain BMP/CBC in one week   Home Health:*no Equipment/Devices:on oxygen already   Discharge Condition: Stable.  CODE STATUS: Full code.  Diet recommendation: Heart Healthy / Carb Modified  Brief/Interim Summary: 1 Sepsis secondary to aspiration pneumonia Patient was admitted and met criteria for sepsis with fevers, leukocytosis, altered mental status felt to be secondary to aspiration pneumonia noted on chest x-ray. Patient with some clinical improvement. Patient currently afebrile. Leukocytosis trending down. Patient however on steroids. Strep pneumonia negative, legionella negative  Continue oxygen, empiric IV Zosyn, Flonase, singular. Discontinue IV vancomycin 7-19. Speech to do swallow evaluation, esophageal dysphagia. Small frequent meals. Increase PPI to BID. Needs to follow with primary GI Dr.  Treated with IV zosyn for 5 day , will provide Augmentin for 2 days.   2 Acute COPD exacerbation Likely triggered by aspiration pneumonia. Patient with some clinical improvement however with less wheezing on examination. Continue oxygen, steroid taper, empiric IV antibiotics, Flonase, singular, PPI, scheduled nebulizer treatments, Pulmicort and Brovana. Chest PT. Pulmonary toilet. Follow. Lungs clear, stop IV steroids. Oral prednisone taper.   3 Iron deficiency anemia Patient noted to be anemic on admission. Anemia panel consistent with iron deficiency anemia. Iron level of 9. Ferritin of 23. s/p IV iron. Continue oral iron supplementation. Continue home bowel regimen. Placed on MiraLAX daily. Follow. Hb improved to 9.    4 Metabolic encephalopathy Likely secondary to problem #1 and 2.  Improving. Likely close to baseline.  5 hypertension BP meds were held on admission secondary to borderline blood pressure.  Resumed Coreg, hold bumex  Continue with spironolactone, but monitor renal function.   6 coronary artery disease status post remote PCI Patient denies any chest pain currently. Continue home regimen of aspirin, statin, Plavix, Coreg, hold Bumex and continue Aldactone.   7 insulin-dependent diabetes mellitus with neuropathy Hemoglobin A1c was 6.8 on 06/08/2015. CBGs have ranged from 120 - 339. Likely steroid induced hyperglycemia.  Received coverage prior to discharge for glucose of 400. Suspect will decrease now that she wont be on IV sterodis. She will use novolog at home and [facility]  8 depression Zoloft.  9 severe R ICA stenosis status post CEA 05/16/2015   10 paroxysmal atrial fibrillation In normal sinus rhythm. Continue Coreg.  11-AKI; improved. Resume  Bumex at discharge   Blood culture positive for diphtheroids likely contaminant .   Discharge Diagnoses:  Principal Problem:   Aspiration pneumonia ([place]) Active Problems:   Type 2 diabetes with nephropathy (HCC)   HTN (hypertension)   CAD S/P remote PCI    Apnea, sleep   Anemia   Sepsis ([place])   COPD exacerbation (HCC)   Metabolic encephalopathy   Chronic respiratory failure with hypoxia (HCC)   Anemia, iron deficiency    Discharge Instructions  Discharge Instructions    Diet - low sodium heart healthy    Complete by:  As directed      Increase activity slowly    Complete by:  As directed             Medication List    STOP taking these medications        acetylcysteine 20 % nebulizer solution  Commonly known as:  MUCOMYST  insulin glargine 100 UNIT/ML injection  Commonly known as:  LANTUS     oxyCODONE 5 MG immediate release tablet  Commonly known as:  Oxy IR/ROXICODONE      TAKE these medications        acetaminophen 500 MG tablet  Commonly known as:  TYLENOL  Take  2 tablets (1,000 mg total) by mouth every 6 (six) hours as needed for moderate pain.     amoxicillin-clavulanate 875-125 MG tablet  Commonly known as:  AUGMENTIN  Take 1 tablet by mouth 2 (two) times daily.     ARTIFICIAL TEARS OP  Place 1 drop into both eyes daily.     aspirin EC 81 MG tablet  Take 81 mg by mouth daily.     atorvastatin 40 MG tablet  Commonly known as:  LIPITOR  TAKE ONE TABLET BY MOUTH AT BEDTIME.     bumetanide 2 MG tablet  Commonly known as:  BUMEX  2 mg daily.     busPIRone 15 MG tablet  Commonly known as:  BUSPAR  Take 1 tablet (15 mg total) by mouth 2 (two) times daily.     carvedilol 12.5 MG tablet  Commonly known as:  COREG  Take 12.5 mg by mouth 2 (two) times daily with a meal.     clopidogrel 75 MG tablet  Commonly known as:  PLAVIX  Take 1 tablet (75 mg total) by mouth daily.     ferrous sulfate 325 (65 FE) MG tablet  Take 1 tablet (325 mg total) by mouth 3 (three) times daily with meals.     fluticasone 50 MCG/ACT nasal spray  Commonly known as:  FLONASE  Place 2 sprays into both nostrils at bedtime.     gabapentin 300 MG capsule  Commonly known as:  NEURONTIN  Take 1 capsule (300 mg total) by mouth 2 (two) times daily.     insulin NPH Human 100 UNIT/ML injection  Commonly known as:  HUMULIN N,NOVOLIN N  Inject 16 Units into the skin at bedtime.     insulin regular 100 units/mL injection  Commonly known as:  NOVOLIN R,HUMULIN R  Inject 3-20 Units into the skin 3 (three) times daily before meals. Based on sliding scale     lactulose 10 GM/15ML solution  Commonly known as:  CHRONULAC  Take 15 mLs (10 g total) by mouth 2 (two) times daily as needed for mild constipation.     levothyroxine 100 MCG tablet  Commonly known as:  SYNTHROID, LEVOTHROID  Take 1 tablet (100 mcg total) by mouth every other day. (alternate with the 112 mcg strength)     levothyroxine 112 MCG tablet  Commonly known as:  SYNTHROID, LEVOTHROID  Take 1 tablet  (112 mcg total) by mouth every other day. (alternate with the 100 mcg strength)     montelukast 10 MG tablet  Commonly known as:  SINGULAIR  Take 1 tablet (10 mg total) by mouth daily.     nitroGLYCERIN 0.4 MG SL tablet  Commonly known as:  NITROSTAT  Place 0.4 mg under the tongue every 5 (five) minutes as needed for chest pain.     OXYGEN  Inhale 2 L into the lungs continuous.     pantoprazole 20 MG tablet  Commonly known as:  PROTONIX  Take 1 tablet (20 mg total) by mouth 2 (two) times daily.     predniSONE 20 MG tablet  Commonly known as:  DELTASONE  Take 2 tablets for 3 days then 1  tablet for 3 days then stop./     PROAIR HFA 108 (90 Base) MCG/ACT inhaler  Generic drug:  albuterol  Inhale 2 puffs into the lungs every 4 (four) hours as needed for wheezing or shortness of breath.     albuterol 0.63 MG/3ML nebulizer solution  Commonly known as:  ACCUNEB  Take 1 ampule by nebulization every 6 (six) hours as needed for wheezing or shortness of breath.     Probiotic 250 MG Caps  Take 250 mg by mouth 2 (two) times daily.     QUEtiapine 25 MG tablet  Commonly known as:  SEROQUEL  Take 0.5 tablets (12.5 mg total) by mouth at bedtime.     ranitidine 300 MG tablet  Commonly known as:  ZANTAC  Take 1 tablet (300 mg total) by mouth at bedtime.     rOPINIRole 1 MG tablet  Commonly known as:  REQUIP  TAKE ONE TABLET BY MOUTH THREE TIMES DAILY.     senna 8.6 MG Tabs tablet  Commonly known as:  SENOKOT  Take 1 tablet by mouth daily as needed (constipation).     sertraline 100 MG tablet  Commonly known as:  ZOLOFT  Take 1.5 tablets (150 mg total) by mouth daily.     SPIRIVA HANDIHALER 18 MCG inhalation capsule  Generic drug:  tiotropium  Place 18 mcg into inhaler and inhale daily.     spironolactone 25 MG tablet  Commonly known as:  ALDACTONE  Take 25 mg by mouth daily.     Standard TENS Devi  1 Units by Does not apply route daily.     SYMBICORT 160-4.5 MCG/ACT  inhaler  Generic drug:  budesonide-formoterol  INHALE TWO PUFFS BY MOUTH TWICE DAILY . RINSE MOUTH AFTER EACH USE     tiZANidine 2 MG tablet  Commonly known as:  ZANAFLEX  Take 1 tablet (2 mg total) by mouth at bedtime.           Follow-up Information    Follow up with Enid Derry, MD In 1 week.   Specialty:  Family Medicine   Contact information:   [address] [address] [phone]      Allergies  Allergen Reactions  . Vasotec [Enalapril] Swelling    Throat swells  . Enalapril Swelling    Throat swells  . Codeine Nausea And Vomiting  . Morphine And Related Nausea And Vomiting    Consultations:  none   Procedures/Studies: Dg Chest 2 View  07/22/2015  CLINICAL DATA:  Pneumonia with lethargy, fever, productive cough and chest congestion for a few days. EXAM: CHEST  2 VIEW COMPARISON:  07/21/2015 FINDINGS: AP and lateral views of the chest show hyperexpansion. Interstitial markings are diffusely coarsened with chronic features. Lateral film shows focal opacity overlying the lower spine, consistent with posterior lower lobe airspace disease. The cardio pericardial silhouette is enlarged. IMPRESSION: Posterior lower lobe airspace disease on the lateral film cannot be localized on the frontal projection. Imaging features suggest posterior lower lobe pneumonia. Electronically Signed   By: Misty Stanley M.D.   On: 07/22/2015 09:54   Dg Chest Port 1 View  07/21/2015  CLINICAL DATA:  Acute sepsis, diabetes, atrial fibrillation EXAM: PORTABLE CHEST 1 VIEW COMPARISON:  05/23/2015, 05/18/2015 FINDINGS: Stable cardiomegaly with vascular congestion and minor basilar atelectasis. No focal pneumonia, collapse or consolidation. Negative for edema, effusion or pneumothorax. Trachea is midline. Thoracic aortic atherosclerosis noted and degenerative changes of the spine and shoulders. Monitor leads overlie  the chest. IMPRESSION: Cardiomegaly with vascular congestion.   Basilar atelectasis. Thoracic aortic atherosclerosis. Electronically Signed   By: Jerilynn Mages.  Shick M.D.   On: 07/21/2015 12:22   Dg Knee Right Port  07/21/2015  CLINICAL DATA:  Right knee pain without reported injury. EXAM: PORTABLE RIGHT KNEE - 1-2 VIEW COMPARISON:  Radiographs of March 29, 2015. FINDINGS: No evidence of fracture, dislocation, or joint effusion. Severe narrowing seen involving the medial joint space, with mild narrowing of patellofemoral space. Soft tissues are unremarkable. 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 No acute abnormality seen in the right knee. Electronically Signed   By: Marijo Conception, M.D.   On: 07/21/2015 15:38       Subjective: She is feeling better, breathing better.    Discharge Exam: Filed Vitals:   07/26/15 2122 07/27/15 0515  BP: 138/60 146/63  Pulse: 62 57  Temp: 98.2 F (36.8 C) 98.1 F (36.7 C)  Resp: 18 20   Filed Vitals:   07/26/15 1456 07/26/15 2110 07/26/15 2122 07/27/15 0515  BP:   138/60 146/63  Pulse:  78 62 57  Temp:   98.2 F (36.8 C) 98.1 F (36.7 C)  TempSrc:    Oral  Resp:  18 18 20   Height:      Weight:    87.2 kg (192 lb 3.9 oz)  SpO2: 100% 99% 100% 100%    General: Pt is alert, awake, not in acute distress Cardiovascular: RRR, S1/S2 +, no rubs, no gallops Respiratory: CTA bilaterally, no wheezing, no rhonchi Abdominal: Soft, NT, ND, bowel sounds + Extremities: no edema, no cyanosis    The results of significant diagnostics from this hospitalization (including imaging, microbiology, ancillary and laboratory) are listed below for reference.     Microbiology: Recent Results (from the past 240 hour(s))  Blood Culture (routine x 2)     Status: Abnormal   Collection Time: 07/21/15 11:20 AM  Result Value Ref Range Status   Specimen Description BLOOD RIGHT IV SITE  Final   Special Requests BOTTLES DRAWN AEROBIC AND ANAEROBIC 5CC  Final   Culture  Setup Time   Final    GRAM POSITIVE  RODS AEROBIC BOTTLE ONLY CRITICAL RESULT CALLED TO, READ BACK BY AND VERIFIED WITH: M. TURNER, PHARMD AT 1155 ON 07/23/15 BY C. JESSUP, MLT.    Culture (A)  Final    DIPHTHEROIDS(CORYNEBACTERIUM SPECIES) Standardized susceptibility testing for this organism is not available.    Report Status 07/24/2015 FINAL  Final  Blood Culture ID Panel (Reflexed)     Status: None   Collection Time: 07/21/15 11:20 AM  Result Value Ref Range Status   Enterococcus species NOT DETECTED NOT DETECTED Final   Vancomycin resistance NOT DETECTED NOT DETECTED Final   Listeria monocytogenes NOT DETECTED NOT DETECTED Final   Staphylococcus species NOT DETECTED NOT DETECTED Final   Staphylococcus aureus NOT DETECTED NOT DETECTED Final   Methicillin resistance NOT DETECTED NOT DETECTED Final   Streptococcus species NOT DETECTED NOT DETECTED Final   Streptococcus agalactiae NOT DETECTED NOT DETECTED Final   Streptococcus pneumoniae NOT DETECTED NOT DETECTED Final   Streptococcus pyogenes NOT DETECTED NOT DETECTED Final   Acinetobacter baumannii NOT DETECTED NOT DETECTED Final   Enterobacteriaceae species NOT DETECTED NOT DETECTED Final   Enterobacter cloacae complex NOT DETECTED NOT DETECTED Final   Escherichia coli NOT DETECTED NOT DETECTED Final   Klebsiella oxytoca NOT DETECTED NOT DETECTED Final   Klebsiella pneumoniae NOT DETECTED NOT DETECTED Final  Proteus species NOT DETECTED NOT DETECTED Final   Serratia marcescens NOT DETECTED NOT DETECTED Final   Carbapenem resistance NOT DETECTED NOT DETECTED Final   Haemophilus influenzae NOT DETECTED NOT DETECTED Final   Neisseria meningitidis NOT DETECTED NOT DETECTED Final   Pseudomonas aeruginosa NOT DETECTED NOT DETECTED Final   Candida albicans NOT DETECTED NOT DETECTED Final   Candida glabrata NOT DETECTED NOT DETECTED Final   Candida krusei NOT DETECTED NOT DETECTED Final   Candida parapsilosis NOT DETECTED NOT DETECTED Final   Candida tropicalis NOT  DETECTED NOT DETECTED Final  Blood Culture (routine x 2)     Status: None   Collection Time: 07/21/15 11:42 AM  Result Value Ref Range Status   Specimen Description BLOOD LEFT ANTECUBITAL  Final   Special Requests BOTTLES DRAWN AEROBIC AND ANAEROBIC 5CC  Final   Culture NO GROWTH 5 DAYS  Final   Report Status 07/26/2015 FINAL  Final  Urine culture     Status: Abnormal   Collection Time: 07/21/15  1:52 PM  Result Value Ref Range Status   Specimen Description URINE, CLEAN CATCH  Final   Special Requests NONE  Final   Culture <10,000 COLONIES/mL INSIGNIFICANT GROWTH (A)  Final   Report Status 07/22/2015 FINAL  Final     Labs: BNP (last 3 results)  Recent Labs  10/23/14 0743  BNP 735.3*   Basic Metabolic Panel:  Recent Labs Lab 07/23/15 0626 07/24/15 0848 07/25/15 0558 07/26/15 0551 07/27/15 0555  NA 140 143 140 138 138  K 4.0 3.7 3.3* 3.8 4.6  CL 105 100* 96* 94* 99*  CO2 30 30 30  36* 32  GLUCOSE 197* 120* 127* 210* 294*  BUN 14 15 20  26* 23*  CREATININE 0.97 1.09* 1.35* 1.23* 1.12*  CALCIUM 9.0 9.5 8.9 9.1 9.0  MG  --  2.1  --   --   --    Liver Function Tests:  Recent Labs Lab 07/21/15 1120  AST 22  ALT 15  ALKPHOS 104  BILITOT 1.3*  PROT 7.2  ALBUMIN 3.6   No results for input(s): LIPASE, AMYLASE in the last 168 hours. No results for input(s): AMMONIA in the last 168 hours. CBC:  Recent Labs Lab 07/21/15 1120 07/22/15 0430 07/23/15 0626 07/24/15 0848 07/25/15 0558 07/27/15 0555  WBC 16.6* 13.6* 12.9* 11.7* 11.4*  --   NEUTROABS 14.1*  --   --   --   --   --   HGB 8.7* 8.1* 7.8* 9.2* 9.0* 8.9*  HCT 29.2* 25.7* 26.0* 29.2* 29.0* 28.9*  MCV 84.1 84.0 84.7 83.9 83.6  --   PLT 189 152 195 191 229  --    Cardiac Enzymes: No results for input(s): CKTOTAL, CKMB, CKMBINDEX, TROPONINI in the last 168 hours. BNP: Invalid input(s): POCBNP CBG:  Recent Labs Lab 07/25/15 2147 07/26/15 1155 07/26/15 1637 07/26/15 2110 07/27/15 0748  GLUCAP 294*  339* 145* 310* 283*   D-Dimer No results for input(s): DDIMER in the last 72 hours. Hgb A1c No results for input(s): HGBA1C in the last 72 hours. Lipid Profile No results for input(s): CHOL, HDL, LDLCALC, TRIG, CHOLHDL, LDLDIRECT in the last 72 hours. Thyroid function studies No results for input(s): TSH, T4TOTAL, T3FREE, THYROIDAB in the last 72 hours.  Invalid input(s): FREET3 Anemia work up No results for input(s): VITAMINB12, FOLATE, FERRITIN, TIBC, IRON, RETICCTPCT in the last 72 hours. Urinalysis    Component Value Date/Time   COLORURINE YELLOW 07/21/2015 [place]  09/01/2013 [place] 07/21/2015 1352   APPEARANCEUR Cloudy* 02/20/2015 0948   APPEARANCEUR Clear 09/01/2013 1134   LABSPEC 1.023 07/21/2015 1352   LABSPEC 1.004 09/01/2013 1134   PHURINE 8.0 07/21/2015 1352   PHURINE 5.0 09/01/2013 1134   GLUCOSEU NEGATIVE 07/21/2015 1352   GLUCOSEU 50 mg/dL 09/01/2013 1134   HGBUR NEGATIVE 07/21/2015 1352   HGBUR Negative 09/01/2013 1134   BILIRUBINUR NEGATIVE 07/21/2015 1352   BILIRUBINUR neg 05/04/2015 1449   BILIRUBINUR Negative 02/20/2015 0948   BILIRUBINUR Negative 09/01/2013 1134   KETONESUR NEGATIVE 07/21/2015 1352   KETONESUR Negative 09/01/2013 1134   PROTEINUR 30* 07/21/2015 1352   PROTEINUR neg 05/04/2015 1449   PROTEINUR Trace 02/20/2015 0948   PROTEINUR Negative 09/01/2013 1134   UROBILINOGEN 0.2 05/04/2015 1449   UROBILINOGEN 0.2 10/23/2014 1008   NITRITE NEGATIVE 07/21/2015 1352   NITRITE neg 05/04/2015 1449   NITRITE Negative 02/20/2015 0948   NITRITE Negative 09/01/2013 1134   LEUKOCYTESUR NEGATIVE 07/21/2015 1352   LEUKOCYTESUR 2+* 02/20/2015 0948   LEUKOCYTESUR Negative 09/01/2013 1134   Sepsis Labs Invalid input(s): PROCALCITONIN,  WBC,  LACTICIDVEN Microbiology Recent Results (from the past 240 hour(s))  Blood Culture (routine x 2)     Status: Abnormal   Collection Time: 07/21/15 11:20 AM  Result Value Ref  Range Status   Specimen Description BLOOD RIGHT IV SITE  Final   Special Requests BOTTLES DRAWN AEROBIC AND ANAEROBIC 5CC  Final   Culture  Setup Time   Final    GRAM POSITIVE RODS AEROBIC BOTTLE ONLY CRITICAL RESULT CALLED TO, READ BACK BY AND VERIFIED WITH: M. TURNER, PHARMD AT 1155 ON 07/23/15 BY C. JESSUP, MLT.    Culture (A)  Final    DIPHTHEROIDS(CORYNEBACTERIUM SPECIES) Standardized susceptibility testing for this organism is not available.    Report Status 07/24/2015 FINAL  Final  Blood Culture ID Panel (Reflexed)     Status: None   Collection Time: 07/21/15 11:20 AM  Result Value Ref Range Status   Enterococcus species NOT DETECTED NOT DETECTED Final   Vancomycin resistance NOT DETECTED NOT DETECTED Final   Listeria monocytogenes NOT DETECTED NOT DETECTED Final   Staphylococcus species NOT DETECTED NOT DETECTED Final   Staphylococcus aureus NOT DETECTED NOT DETECTED Final   Methicillin resistance NOT DETECTED NOT DETECTED Final   Streptococcus species NOT DETECTED NOT DETECTED Final   Streptococcus agalactiae NOT DETECTED NOT DETECTED Final   Streptococcus pneumoniae NOT DETECTED NOT DETECTED Final   Streptococcus pyogenes NOT DETECTED NOT DETECTED Final   Acinetobacter baumannii NOT DETECTED NOT DETECTED Final   Enterobacteriaceae species NOT DETECTED NOT DETECTED Final   Enterobacter cloacae complex NOT DETECTED NOT DETECTED Final   Escherichia coli NOT DETECTED NOT DETECTED Final   Klebsiella oxytoca NOT DETECTED NOT DETECTED Final   Klebsiella pneumoniae NOT DETECTED NOT DETECTED Final   Proteus species NOT DETECTED NOT DETECTED Final   Serratia marcescens NOT DETECTED NOT DETECTED Final   Carbapenem resistance NOT DETECTED NOT DETECTED Final   Haemophilus influenzae NOT DETECTED NOT DETECTED Final   Neisseria meningitidis NOT DETECTED NOT DETECTED Final   Pseudomonas aeruginosa NOT DETECTED NOT DETECTED Final   Candida albicans NOT DETECTED NOT DETECTED Final    Candida glabrata NOT DETECTED NOT DETECTED Final   Candida krusei NOT DETECTED NOT DETECTED Final   Candida parapsilosis NOT DETECTED NOT DETECTED Final   Candida tropicalis NOT DETECTED NOT DETECTED Final  Blood Culture (routine x 2)     Status:  None   Collection Time: 07/21/15 11:42 AM  Result Value Ref Range Status   Specimen Description BLOOD LEFT ANTECUBITAL  Final   Special Requests BOTTLES DRAWN AEROBIC AND ANAEROBIC 5CC  Final   Culture NO GROWTH 5 DAYS  Final   Report Status 07/26/2015 FINAL  Final  Urine culture     Status: Abnormal   Collection Time: 07/21/15  1:52 PM  Result Value Ref Range Status   Specimen Description URINE, CLEAN CATCH  Final   Special Requests NONE  Final   Culture <10,000 COLONIES/mL INSIGNIFICANT GROWTH (A)  Final   Report Status 07/22/2015 FINAL  Final     Time coordinating discharge: Over 30 minutes  SIGNED:   Elmarie Shiley, MD  [facility] 07/27/2015, 9:10 AM Pager [phone]  If 7PM-7AM, please contact night-coverage [email or web address] Password [email or web address]

## 2015-07-27 NOTE — Progress Notes (Signed)
Inpatient Diabetes Program Recommendations  AACE/ADA: New Consensus Statement on Inpatient Glycemic Control (2015)  Target Ranges:  Prepandial:   less than 140 mg/dL      Peak postprandial:   less than 180 mg/dL (1-2 hours)      Critically ill patients:  140 - 180 mg/dL   Results for Bonnie Henry, Bonnie Henry (MRN [id number]) as of 07/27/2015 10:48  Ref. Range 07/25/2015 21:47 07/26/2015 11:55 07/26/2015 16:37 07/26/2015 21:10 07/27/2015 07:48  Glucose-Capillary Latest Ref Range: 65-99 mg/dL 294 (H) 339 (H) 145 (H) 310 (H) 283 (H)   Review of Glycemic Control  Diabetes history: DM2 Outpatient Diabetes medications: NPH 16 units  QHS, Regular 3-20 units TID with meals Current orders for Inpatient glycemic control: Novolog 0-15 units TID with meals, Novolog 6 units TID with meals, Lantus 15 units daily  Inpatient Diabetes Program Recommendations: Correction (SSI): Bedtime glucose 310 mg/dl last night but no insulin given because no bedtime Novolog ordered. Please order Novolog bedtime correction scale. Insulin - Meal Coverage: If steroids are continued, please consider increasing meal coverage to Novolog 10 units TID with meals.  Thanks, Barnie Alderman, RN, MSN, CDE Diabetes Coordinator Inpatient Diabetes Program [phone] (Team Pager from [place] to [place]) [phone] ([place] office) [phone] [place] office) [phone] [facility] office)

## 2015-07-27 NOTE — Care Management Note (Signed)
Case Management Note  Patient Details  Name: Bonnie Henry MRN: [id number] Date of Birth: 1940-05-08  Subjective/Objective:                Adm with sepsis due to aspiration PNA. PMH - copd, dm , htn, breast CA, cad, copd.   Action/Plan: Discharge to home today.  Expected Discharge Date:    07/27/2015           Expected Discharge Plan:  Home/Self Care  In-House Referral:     Discharge planning Services  CM Consult  Post Acute Care Choice:    Choice offered to:     DME Arranged:    DME Agency:     HH Arranged:    HH Agency:     Status of Service:  Completed, signed off  If discussed at [employer] of Stay Meetings, dates discussed:    Additional Comments:  Sharin Mons, RN,BSN,CM 07/27/2015, 10:30 AM

## 2015-07-31 DIAGNOSIS — L603 Nail dystrophy: Secondary | ICD-10-CM | POA: Diagnosis not present

## 2015-07-31 DIAGNOSIS — I739 Peripheral vascular disease, unspecified: Secondary | ICD-10-CM | POA: Diagnosis not present

## 2015-07-31 DIAGNOSIS — M89371 Hypertrophy of bone, right ankle and foot: Secondary | ICD-10-CM | POA: Diagnosis not present

## 2015-07-31 DIAGNOSIS — E1051 Type 1 diabetes mellitus with diabetic peripheral angiopathy without gangrene: Secondary | ICD-10-CM | POA: Diagnosis not present

## 2015-08-03 ENCOUNTER — Encounter: Payer: Self-pay | Admitting: Family Medicine

## 2015-08-03 ENCOUNTER — Ambulatory Visit (INDEPENDENT_AMBULATORY_CARE_PROVIDER_SITE_OTHER): Payer: Medicare Other | Admitting: Family Medicine

## 2015-08-03 DIAGNOSIS — D6489 Other specified anemias: Secondary | ICD-10-CM | POA: Diagnosis not present

## 2015-08-03 DIAGNOSIS — E038 Other specified hypothyroidism: Secondary | ICD-10-CM | POA: Diagnosis not present

## 2015-08-03 DIAGNOSIS — I6523 Occlusion and stenosis of bilateral carotid arteries: Secondary | ICD-10-CM

## 2015-08-03 DIAGNOSIS — J690 Pneumonitis due to inhalation of food and vomit: Secondary | ICD-10-CM | POA: Diagnosis not present

## 2015-08-03 DIAGNOSIS — N289 Disorder of kidney and ureter, unspecified: Secondary | ICD-10-CM | POA: Diagnosis not present

## 2015-08-03 MED ORDER — NYSTATIN 100000 UNIT/GM EX CREA: 1.0000 "application " | TOPICAL_CREAM | Freq: Two times a day (BID) | CUTANEOUS | 2 refills | Status: DC

## 2015-08-03 MED ORDER — FLUCONAZOLE 150 MG PO TABS: 150.0000 mg | ORAL_TABLET | Freq: Once | ORAL | 0 refills | Status: AC

## 2015-08-03 NOTE — Assessment & Plan Note (Signed)
Managed by Dr. Solum 

## 2015-08-03 NOTE — Progress Notes (Signed)
BP 134/82   Pulse (!) 58   Temp 98.4 F (36.9 C) (Oral)   Resp 14   Wt 191 lb (86.6 kg)   SpO2 97%   BMI 37.30 kg/m    Subjective:    Patient ID: Bonnie Henry, female    DOB: Aug 27, 1940, 75 y.o.   MRN: [id number]  HPI: Bonnie Henry is a 75 y.o. female  Chief Complaint  Patient presents with  . Hospitalization Follow-up    pneumonia   Patient is here for hosp f/u with her daughter Hospitalized with pneumonia and sepsis; breathing is okay except when in humidity; coughing something up every now and then, but it's clear; finished antibiotics; no fevers; no chest pain Aspiration pneumonia says daughter When she swallows it was going down the wrong way; patient had swallow study in Nov; she does not repeat swallow study (her choice) On 2 liters per minutes around the clock;  Needs recheck of CBC and BMP per discharge summary; notes reviewed Had anemia and was given IV iron in the hospital Thyroid medicine was adjusted recently  She has a rip-roaring yeast infection, on legs and in creases and up inside  Depression screen [facility] 2/9 06/08/2015 05/04/2015 01/22/2015  Decreased Interest 0 0 3  Down, Depressed, Hopeless 0 0 0  PHQ - 2 Score 0 0 3  Altered sleeping - - 3  Tired, decreased energy - - 1  Change in appetite - - 0  Feeling bad or failure about yourself  - - 0  Trouble concentrating - - 0  Moving slowly or fidgety/restless - - 0  Suicidal thoughts - - 0  PHQ-9 Score - - 7  Difficult doing work/chores - - Somewhat difficult   Relevant past medical, surgical, family and social history reviewed Past Medical History:  Diagnosis Date  . Anemia   . Anxiety   . Arthritis    "joints ache all over" . osteoarthritis  . Asthma   . Atrial fibrillation ([place])   . Breast cancer, left breast (HCC)    S/P mastectomy  . CAD (coronary artery disease)   . CHF (congestive heart failure) ([place])   . Chronic back pain   . Constipation 03/04/2015  . COPD (chronic  obstructive pulmonary disease) ([place])   . Depression, major ([place])   . Diabetes mellitus without complication ([place])   . DVT (deep venous thrombosis) ([place])   . GERD (gastroesophageal reflux disease)   . Headache   . Heart murmur   . Hepatitis    HEP "C"  . History of blood transfusion    "when I had my mastectomy"  . History of hiatal hernia   . History of right-sided carotid endarterectomy 06/10/2015  . Hypercholesteremia   . Hypertension   . Morbid obesity ([place])   . Myocardial infarction ([place])   . Neuropathy ([place])   . Peripheral vascular disease ([place])   . Post-surgical hypothyroidism   . Restless leg syndrome   . Shortness of breath dyspnea   . Sleep apnea    "2015 wore mask; lost weight; told didn't need mask anymore" (02/23/2014)  . Type II diabetes mellitus ([place])    Past Surgical History:  Procedure Laterality Date  . ARTERY BIOPSY Right 09/29/2014   Procedure: BIOPSY TEMPORAL ARTERY;  Surgeon: Angelia Mould, MD;  Location: [place];  Service: Vascular;  Laterality: Right;  . BARTHOLIN CYST MARSUPIALIZATION    . BREAST BIOPSY Left    Mastectomy  . CARDIAC CATHETERIZATION  2000's  . CAROTID ENDARTERECTOMY Left 2009  . CATARACT EXTRACTION W/ INTRAOCULAR LENS  IMPLANT, BILATERAL Bilateral ~ 2011  . CORONARY ANGIOPLASTY WITH STENT PLACEMENT  1990's X 2   "2; 1"  . ENDARTERECTOMY Right 05/16/2015   Procedure: ENDARTERECTOMY CAROTID;  Surgeon: Katha Cabal, MD;  Location: [facility];  Service: Vascular;  Laterality: Right;  . ESOPHAGOGASTRODUODENOSCOPY (EGD) WITH PROPOFOL N/A 12/12/2014   Procedure: ESOPHAGOGASTRODUODENOSCOPY (EGD) WITH PROPOFOL;  Surgeon: Lucilla Lame, MD;  Location: [facility];  Service: Endoscopy;  Laterality: N/A;  . EYE SURGERY Bilateral    Catract Extraction with IOL  . HAMMER TOE SURGERY Left    2nd and 3rd digits  . JOINT REPLACEMENT Bilateral    Total Hip Replacement  . JOINT REPLACEMENT Left    Total Knee Replacement  . LEFT HEART  CATHETERIZATION WITH CORONARY ANGIOGRAM N/A 02/24/2014   Procedure: LEFT HEART CATHETERIZATION WITH CORONARY ANGIOGRAM;  Surgeon: Leonie Man, MD;  Location[facility] C[facility] LAB;  Service: Cardiovascular;  Laterality: N/A;  . MASTECTOMY Left 1990's  . SHOULDER SURGERY Right    "cleaned it out; no scope"  . THYROIDECTOMY, PARTIAL  1950's  . TOE SURGERY Right    "cut piece of bone out so toe didn't dig into my foot"  . TONSILLECTOMY  1950's  . TOTAL HIP ARTHROPLASTY Bilateral 1990's  . TOTAL KNEE ARTHROPLASTY Left 1990's  . TRIGGER FINGER RELEASE Left   . TUBAL LIGATION    . VAGINAL HYSTERECTOMY     "partial"   Family History  Problem Relation Age of Onset  . Arthritis Mother   . Cancer Mother   . Diabetes Mother   . Heart disease Mother   . Hyperlipidemia Mother   . Hypertension Mother   . Alcohol abuse Father   . Alcohol abuse Brother   . Arthritis Brother   . Asthma Brother   . HIV Brother   . Cancer Brother   . Diabetes Brother   . Hyperlipidemia Brother   . Hypertension Brother   . Lung disease Brother   . Arthritis Maternal Grandmother   . Brain cancer Father    Social History  Substance Use Topics  . Smoking status: Former Smoker    Packs/day: 1.50    Years: 44.00    Types: Cigarettes    Quit date: 01/06/1994  . Smokeless tobacco: Never Used     Comment: "quit smoking cigarettes in ~ 2000"  . Alcohol use No     Comment: 02/23/2014 "no alcohol since 1990's"   Interim medical history since last visit reviewed. Allergies and medications reviewed  Review of Systems Per HPI unless specifically indicated above     Objective:    BP 134/82   Pulse (!) 58   Temp 98.4 F (36.9 C) (Oral)   Resp 14   Wt 191 lb (86.6 kg)   SpO2 97%   BMI 37.30 kg/m   Wt Readings from Last 3 Encounters:  08/03/15 191 lb (86.6 kg)  07/27/15 192 lb 3.9 oz (87.2 kg)  07/12/15 199 lb 1 oz (90.3 kg)    Physical Exam  Constitutional: She appears well-developed and well-nourished.    Elderly, appears chronically ill; deconditioned, older than stated age; nontoxic; seated in wheelchair; gait not assessed  Neck: No JVD present.  Cardiovascular: Normal rate and regular rhythm.   Pulmonary/Chest: Effort normal and breath sounds normal.  Abdominal: Soft. She exhibits no distension.  obese  Neurological: She is alert. She displays no tremor.  Skin: Ecchymosis (sun-exposed portion of arms, few scattered purplish ecchymoses) noted. No rash noted. No pallor.  Psychiatric: She has a normal mood and affect. Her affect is not blunt. She does not exhibit a depressed mood.  Good eye contact with examiner; somewhat curt       Assessment & Plan:   Problem List Items Addressed This Visit      Respiratory   Aspiration pneumonia ([place])    Resolving; check CBC; no clinical signs to suggest worsening or return; careful eating/drinking reinforced        Endocrine   Adult hypothyroidism    Managed by Dr. Gabriel Carina        Genitourinary   Renal insufficiency    Check BMP; avoid NSAIDs; good water drinker      Relevant Orders   BASIC METABOLIC PANEL WITH GFR (Completed)     Other   Anemia    Anemia noted in the hospital; she has had iron infusion in the hospital; recheck CBC today      Relevant Orders   CBC with Differential/Platelet (Completed)    Other Visit Diagnoses   None.     Follow up plan: No Follow-up on file.  An after-visit summary was printed and given to the patient at [place].  Please see the patient instructions which may contain other information and recommendations beyond what is mentioned above in the assessment and plan.  Meds ordered this encounter  Medications  . magnesium 30 MG tablet    Sig: Take 30 mg by mouth 2 (two) times daily.  . Ascorbic Acid (VITAMIN C) 1000 MG tablet    Sig: Take 1,000 mg by mouth daily.  . fluconazole (DIFLUCAN) 150 MG tablet    Sig: Take 1 tablet (150 mg total) by mouth once. Do not take atorvastatin for 3 nights     Dispense:  1 tablet    Refill:  0  . nystatin cream (MYCOSTATIN)    Sig: Apply 1 application topically 2 (two) times daily.    Dispense:  30 g    Refill:  2    Orders Placed This Encounter  Procedures  . BASIC METABOLIC PANEL WITH GFR  . CBC with Differential/Platelet

## 2015-08-03 NOTE — Patient Instructions (Signed)
Chew food slowly and thoroughly and set your fork or spoon down in between bites Check labs today

## 2015-08-03 NOTE — Assessment & Plan Note (Addendum)
Resolving; check CBC; no clinical signs to suggest worsening or return; careful eating/drinking reinforced

## 2015-08-03 NOTE — Assessment & Plan Note (Signed)
Check BMP; avoid NSAIDs; good water drinker

## 2015-08-03 NOTE — Assessment & Plan Note (Signed)
Anemia noted in the hospital; she has had iron infusion in the hospital; recheck CBC today

## 2015-08-04 LAB — CBC WITH DIFFERENTIAL/PLATELET
Basophils Absolute: 0 cells/uL (ref 0–200)
Basophils Relative: 0 %
Eosinophils Absolute: 155 cells/uL (ref 15–500)
Eosinophils Relative: 1 %
HEMATOCRIT: 31.6 % — AB (ref 35.0–45.0)
Hemoglobin: 10 g/dL — ABNORMAL LOW (ref 11.7–15.5)
LYMPHS PCT: 8 %
Lymphs Abs: 1240 cells/uL (ref 850–3900)
MCH: 26.9 pg — ABNORMAL LOW (ref 27.0–33.0)
MCHC: 31.6 g/dL — AB (ref 32.0–36.0)
MCV: 84.9 fL (ref 80.0–100.0)
MONO ABS: 1085 {cells}/uL — AB (ref 200–950)
MONOS PCT: 7 %
MPV: 10.4 fL (ref 7.5–12.5)
NEUTROS PCT: 84 %
Neutro Abs: 13020 cells/uL — ABNORMAL HIGH (ref 1500–7800)
PLATELETS: 203 10*3/uL (ref 140–400)
RBC: 3.72 MIL/uL — AB (ref 3.80–5.10)
RDW: 20.7 % — AB (ref 11.0–15.0)
WBC: 15.5 10*3/uL — AB (ref 3.8–10.8)

## 2015-08-04 LAB — BASIC METABOLIC PANEL WITH GFR
BUN: 17 mg/dL (ref 7–25)
CALCIUM: 9.1 mg/dL (ref 8.6–10.4)
CHLORIDE: 100 mmol/L (ref 98–110)
CO2: 33 mmol/L — ABNORMAL HIGH (ref 20–31)
CREATININE: 0.94 mg/dL — AB (ref 0.60–0.93)
GFR, Est African American: 70 mL/min (ref >=60)
GFR, Est Non African American: 60 mL/min (ref >=60)
Glucose, Bld: 116 mg/dL — ABNORMAL HIGH (ref 65–99)
Potassium: 4 mmol/L (ref 3.5–5.3)
SODIUM: 140 mmol/L (ref 135–146)

## 2015-08-07 DIAGNOSIS — E039 Hypothyroidism, unspecified: Secondary | ICD-10-CM | POA: Diagnosis not present

## 2015-08-07 DIAGNOSIS — Z794 Long term (current) use of insulin: Secondary | ICD-10-CM | POA: Diagnosis not present

## 2015-08-07 DIAGNOSIS — E1165 Type 2 diabetes mellitus with hyperglycemia: Secondary | ICD-10-CM | POA: Diagnosis not present

## 2015-08-07 DIAGNOSIS — E1142 Type 2 diabetes mellitus with diabetic polyneuropathy: Secondary | ICD-10-CM | POA: Diagnosis not present

## 2015-08-09 ENCOUNTER — Ambulatory Visit: Payer: Self-pay | Admitting: Family Medicine

## 2015-08-16 DIAGNOSIS — S60212A Contusion of left wrist, initial encounter: Secondary | ICD-10-CM | POA: Diagnosis not present

## 2015-08-16 DIAGNOSIS — M25532 Pain in left wrist: Secondary | ICD-10-CM | POA: Diagnosis not present

## 2015-09-14 ENCOUNTER — Other Ambulatory Visit: Payer: Self-pay | Admitting: Family Medicine

## 2015-09-20 ENCOUNTER — Other Ambulatory Visit: Payer: Self-pay | Admitting: Family Medicine

## 2015-09-25 ENCOUNTER — Other Ambulatory Visit: Payer: Self-pay | Admitting: Family Medicine

## 2015-09-25 DIAGNOSIS — E661 Drug-induced obesity: Secondary | ICD-10-CM | POA: Diagnosis not present

## 2015-09-25 DIAGNOSIS — G4733 Obstructive sleep apnea (adult) (pediatric): Secondary | ICD-10-CM | POA: Diagnosis not present

## 2015-09-25 DIAGNOSIS — Z23 Encounter for immunization: Secondary | ICD-10-CM | POA: Diagnosis not present

## 2015-09-25 DIAGNOSIS — J449 Chronic obstructive pulmonary disease, unspecified: Secondary | ICD-10-CM | POA: Diagnosis not present

## 2015-09-26 ENCOUNTER — Other Ambulatory Visit: Payer: Self-pay

## 2015-09-26 MED ORDER — ATORVASTATIN CALCIUM 40 MG PO TABS: 40.0000 mg | ORAL_TABLET | Freq: Every day | ORAL | 1 refills | Status: DC

## 2015-09-26 NOTE — Telephone Encounter (Signed)
Last sgpt normal; rx approved

## 2015-09-27 ENCOUNTER — Other Ambulatory Visit: Payer: Self-pay | Admitting: Family Medicine

## 2015-09-27 MED ORDER — OXYCODONE-ACETAMINOPHEN 5-325 MG PO TABS: 0.5000 | ORAL_TABLET | Freq: Four times a day (QID) | ORAL | 0 refills | Status: DC | PRN

## 2015-09-27 NOTE — Progress Notes (Signed)
Patient's daughter here, pt is in pain, out of pain medicine; reviewed previous med list; okay to oxycodone; cautions given; daughter will control her medicines

## 2015-10-08 ENCOUNTER — Other Ambulatory Visit: Payer: Self-pay | Admitting: Family Medicine

## 2015-10-08 NOTE — Telephone Encounter (Signed)
Patient is on so many medicines When I received this request, I decided I'd like to take her OFF of this (seroquel/quetiapine) Please let pt/daughter know that we'll stop this; for the drug-drug interactions and possible risk, I don't think it's worth it to continue this medicine

## 2015-10-09 NOTE — Telephone Encounter (Signed)
Daughter notified 

## 2015-10-09 NOTE — Telephone Encounter (Signed)
Patients daughter states she is not going to be happy with this.  Is there anything else you can prescribe for sleep?

## 2015-10-09 NOTE — Telephone Encounter (Signed)
She is on so many medicines already I'm going to suggest we don't add any more pills Try turning off all electronics for 2 hours before bed Lavender lotion, white noise, word search or crossword puzzle or something on paper that's not mentally over-stimulating, etc to help Try breathing exercises, meditation 30 minutes before bed time (The Relaxation Response by Dr. Leonidas Romberg is my favorite)

## 2015-10-13 ENCOUNTER — Other Ambulatory Visit: Payer: Self-pay | Admitting: Family Medicine

## 2015-10-16 ENCOUNTER — Other Ambulatory Visit: Payer: Self-pay | Admitting: Family Medicine

## 2015-10-16 NOTE — Telephone Encounter (Signed)
PT NEEDS HER PAIN MEDS REFILLED

## 2015-10-17 ENCOUNTER — Other Ambulatory Visit: Payer: Self-pay

## 2015-10-17 NOTE — Telephone Encounter (Signed)
I told patient different dr. Was the one who filled, she states u filled and she was here when u did it.

## 2015-10-18 ENCOUNTER — Telehealth: Payer: Self-pay

## 2015-10-18 MED ORDER — OXYCODONE-ACETAMINOPHEN 5-325 MG PO TABS: 0.5000 | ORAL_TABLET | Freq: Four times a day (QID) | ORAL | 0 refills | Status: DC | PRN

## 2015-10-18 MED ORDER — PANTOPRAZOLE SODIUM 20 MG PO TBEC: 20.0000 mg | DELAYED_RELEASE_TABLET | Freq: Two times a day (BID) | ORAL | 1 refills | Status: DC

## 2015-10-18 NOTE — Telephone Encounter (Signed)
It looks like that was a hospitalist; Rx approved

## 2015-10-18 NOTE — Telephone Encounter (Signed)
Pt is requesting RX for Promethazine 12.5 mg TAB  For nausea or vomiting but another MD Prescribe this to her.

## 2015-10-18 NOTE — Telephone Encounter (Signed)
I would not recommend that she take promethazine; especially with all of her other medicines, that can cause somnolence and falls and confusion I'll suggest diet ginger ale or ginger capsules or Emetrol (OTC) or anti-nausea Sea Bands (available at most drug stores); the bands are worn around the wrist and really do work

## 2015-10-18 NOTE — Telephone Encounter (Signed)
[facility] web site reviewed; no red flags; 20 pills did not last 30 days, so I'll allow 30 pills on this next Rx ----------------------------- Please let daughter know Rx is ready for pick-up

## 2015-10-19 NOTE — Telephone Encounter (Signed)
Patient daughter notified

## 2015-10-19 NOTE — Telephone Encounter (Signed)
Daughter notified 

## 2015-10-30 DIAGNOSIS — I10 Essential (primary) hypertension: Secondary | ICD-10-CM | POA: Diagnosis not present

## 2015-10-30 DIAGNOSIS — Z794 Long term (current) use of insulin: Secondary | ICD-10-CM | POA: Diagnosis not present

## 2015-10-30 DIAGNOSIS — E119 Type 2 diabetes mellitus without complications: Secondary | ICD-10-CM | POA: Diagnosis not present

## 2015-10-30 DIAGNOSIS — R002 Palpitations: Secondary | ICD-10-CM | POA: Diagnosis not present

## 2015-10-30 DIAGNOSIS — G4733 Obstructive sleep apnea (adult) (pediatric): Secondary | ICD-10-CM | POA: Diagnosis not present

## 2015-11-06 DIAGNOSIS — E1042 Type 1 diabetes mellitus with diabetic polyneuropathy: Secondary | ICD-10-CM | POA: Diagnosis not present

## 2015-11-07 DIAGNOSIS — Z794 Long term (current) use of insulin: Secondary | ICD-10-CM | POA: Diagnosis not present

## 2015-11-07 DIAGNOSIS — E1142 Type 2 diabetes mellitus with diabetic polyneuropathy: Secondary | ICD-10-CM | POA: Diagnosis not present

## 2015-11-19 DIAGNOSIS — Z79899 Other long term (current) drug therapy: Secondary | ICD-10-CM | POA: Diagnosis not present

## 2015-11-19 DIAGNOSIS — E1165 Type 2 diabetes mellitus with hyperglycemia: Secondary | ICD-10-CM | POA: Diagnosis not present

## 2015-11-19 DIAGNOSIS — E1142 Type 2 diabetes mellitus with diabetic polyneuropathy: Secondary | ICD-10-CM | POA: Diagnosis not present

## 2015-11-19 DIAGNOSIS — Z794 Long term (current) use of insulin: Secondary | ICD-10-CM | POA: Diagnosis not present

## 2015-11-22 ENCOUNTER — Other Ambulatory Visit: Payer: Self-pay | Admitting: Family Medicine

## 2015-11-23 NOTE — Telephone Encounter (Signed)
Last ALT reviewed; Rx approved 

## 2015-12-11 ENCOUNTER — Other Ambulatory Visit: Payer: Self-pay | Admitting: Family Medicine

## 2015-12-21 ENCOUNTER — Other Ambulatory Visit: Payer: Self-pay | Admitting: Family Medicine

## 2015-12-27 ENCOUNTER — Other Ambulatory Visit: Payer: Self-pay

## 2015-12-27 MED ORDER — MONTELUKAST SODIUM 10 MG PO TABS: 10.0000 mg | ORAL_TABLET | Freq: Every day | ORAL | 11 refills | Status: DC

## 2015-12-27 NOTE — Telephone Encounter (Signed)
rx approved

## 2016-01-15 ENCOUNTER — Encounter: Payer: Self-pay | Admitting: Family Medicine

## 2016-01-15 ENCOUNTER — Ambulatory Visit (INDEPENDENT_AMBULATORY_CARE_PROVIDER_SITE_OTHER): Payer: Medicare Other | Admitting: Family Medicine

## 2016-01-15 VITALS — BP 132/84 | HR 73 | Temp 99.8°F | Resp 18 | Wt 196.0 lb

## 2016-01-15 DIAGNOSIS — J111 Influenza due to unidentified influenza virus with other respiratory manifestations: Secondary | ICD-10-CM | POA: Diagnosis not present

## 2016-01-15 DIAGNOSIS — R8281 Pyuria: Secondary | ICD-10-CM

## 2016-01-15 DIAGNOSIS — N390 Urinary tract infection, site not specified: Secondary | ICD-10-CM | POA: Diagnosis not present

## 2016-01-15 DIAGNOSIS — M545 Low back pain, unspecified: Secondary | ICD-10-CM

## 2016-01-15 DIAGNOSIS — R6889 Other general symptoms and signs: Secondary | ICD-10-CM

## 2016-01-15 LAB — POCT URINALYSIS DIPSTICK
BILIRUBIN UA: NEGATIVE
Glucose, UA: NEGATIVE
KETONES UA: NEGATIVE
Nitrite, UA: NEGATIVE
PH UA: 7.5
RBC UA: NEGATIVE
Spec Grav, UA: 1.015
Urobilinogen, UA: 0.2

## 2016-01-15 LAB — POCT INFLUENZA A/B
Influenza A, POC: NEGATIVE
Influenza B, POC: NEGATIVE

## 2016-01-15 MED ORDER — ROPINIROLE HCL 1 MG PO TABS: 1.0000 mg | ORAL_TABLET | Freq: Three times a day (TID) | ORAL | 6 refills | Status: DC

## 2016-01-15 MED ORDER — OSELTAMIVIR PHOSPHATE 75 MG PO CAPS: 75.0000 mg | ORAL_CAPSULE | Freq: Two times a day (BID) | ORAL | 0 refills | Status: AC

## 2016-01-15 MED ORDER — PANTOPRAZOLE SODIUM 20 MG PO TBEC: 20.0000 mg | DELAYED_RELEASE_TABLET | Freq: Two times a day (BID) | ORAL | 1 refills | Status: DC

## 2016-01-15 MED ORDER — ATORVASTATIN CALCIUM 40 MG PO TABS: 40.0000 mg | ORAL_TABLET | Freq: Every day | ORAL | 2 refills | Status: DC

## 2016-01-15 MED ORDER — PROMETHAZINE HCL 25 MG PO TABS: 12.5000 mg | ORAL_TABLET | Freq: Four times a day (QID) | ORAL | 0 refills | Status: DC | PRN

## 2016-01-15 MED ORDER — RANITIDINE HCL 300 MG PO TABS: 300.0000 mg | ORAL_TABLET | Freq: Every day | ORAL | 11 refills | Status: AC

## 2016-01-15 NOTE — Patient Instructions (Addendum)
You are contagious, so avoid others Try vitamin C (orange juice if not diabetic or vitamin C tablets) and drink green tea to help your immune system during your illness Get plenty of rest and hydration Start the Tamiflu Be careful on the phenergan (nausea medicine) because it can make you sleepy; I don't want you to fall; ask for help if needed If you get worse, go to the hospital

## 2016-01-15 NOTE — Progress Notes (Signed)
BP 132/84   Pulse 73   Temp 99.8 F (37.7 C) (Oral)   Resp 18   Wt 196 lb (88.9 kg)   SpO2 94%   BMI 38.28 kg/m    Subjective:    Patient ID: Bonnie Henry, female    DOB: Feb 01, 1940, 76 y.o.   MRN: [id number]  HPI: Bonnie Henry is a 76 y.o. female  Chief Complaint  Patient presents with  . Urinary Tract Infection    aches, back pain, hesitancy and vomiting   Patient is here for an acute visit Aching and fevers Started to get sick just today; was completely well yesterday Bloody drainage from both sides No sore throat No travel anywhere No medicines tried; would like phenergan or zofran Nausea, back aches, hesitancy, vomiting No blood in the urine Here with her daughter  Depression screen [facility] 2/9 01/15/2016 06/08/2015 05/04/2015 01/22/2015  Decreased Interest 0 0 0 3  Down, Depressed, Hopeless 1 0 0 0  PHQ - 2 Score 1 0 0 3  Altered sleeping - - - 3  Tired, decreased energy - - - 1  Change in appetite - - - 0  Feeling bad or failure about yourself  - - - 0  Trouble concentrating - - - 0  Moving slowly or fidgety/restless - - - 0  Suicidal thoughts - - - 0  PHQ-9 Score - - - 7  Difficult doing work/chores - - - Somewhat difficult   Relevant past medical, surgical, family and social history reviewed Past Medical History:  Diagnosis Date  . Anemia   . Anxiety   . Arthritis    "joints ache all over" . osteoarthritis  . Asthma   . Atrial fibrillation ([place])   . Breast cancer, left breast (HCC)    S/P mastectomy  . CAD (coronary artery disease)   . CHF (congestive heart failure) ([place])   . Chronic back pain   . Constipation 03/04/2015  . COPD (chronic obstructive pulmonary disease) ([place])   . Depression, major   . Diabetes mellitus without complication ([place])   . DVT (deep venous thrombosis) ([place])   . GERD (gastroesophageal reflux disease)   . Headache   . Heart murmur   . Hepatitis    HEP "C"  . History of blood transfusion    "when I had my  mastectomy"  . History of hiatal hernia   . History of right-sided carotid endarterectomy 06/10/2015  . Hypercholesteremia   . Hypertension   . Morbid obesity ([place])   . Myocardial infarction   . Neuropathy ([place])   . Peripheral vascular disease ([place])   . Post-surgical hypothyroidism   . Restless leg syndrome   . Shortness of breath dyspnea   . Sleep apnea    "2015 wore mask; lost weight; told didn't need mask anymore" (02/23/2014)  . Type II diabetes mellitus (HCC)    Family History  Problem Relation Age of Onset  . Arthritis Mother   . Cancer Mother   . Diabetes Mother   . Heart disease Mother   . Hyperlipidemia Mother   . Hypertension Mother   . Alcohol abuse Father   . Brain cancer Father   . Alcohol abuse Brother   . Arthritis Brother   . Asthma Brother   . HIV Brother   . Cancer Brother   . Diabetes Brother   . Hyperlipidemia Brother   . Hypertension Brother   . Lung disease Brother   . Arthritis Maternal Grandmother  Social History  Substance Use Topics  . Smoking status: Former Smoker    Packs/day: 1.50    Years: 44.00    Types: Cigarettes    Quit date: 01/06/1994  . Smokeless tobacco: Never Used     Comment: "quit smoking cigarettes in ~ 2000"  . Alcohol use No     Comment: 02/23/2014 "no alcohol since 1990's"   Interim medical history since last visit reviewed. Allergies and medications reviewed  Review of Systems Per HPI unless specifically indicated above     Objective:    BP 132/84   Pulse 73   Temp 99.8 F (37.7 C) (Oral)   Resp 18   Wt 196 lb (88.9 kg)   SpO2 94%   BMI 38.28 kg/m   Wt Readings from Last 3 Encounters:  01/15/16 196 lb (88.9 kg)  08/03/15 191 lb (86.6 kg)  07/27/15 192 lb 3.9 oz (87.2 kg)    Physical Exam  Constitutional: She appears well-developed and well-nourished. No distress.  HENT:  Head: Normocephalic and atraumatic.  Right Ear: Hearing, tympanic membrane, external ear and ear canal normal.  Left Ear: Hearing,  tympanic membrane, external ear and ear canal normal.  Nose: Mucosal edema, rhinorrhea and septal deviation present.  Mouth/Throat: Oropharynx is clear and moist. No oropharyngeal exudate, posterior oropharyngeal edema or posterior oropharyngeal erythema.  Blood tinged on the right  Eyes: EOM are normal. No scleral icterus.  Neck: No thyromegaly present.  Cardiovascular: Normal rate, regular rhythm and normal heart sounds.   No murmur heard. Pulmonary/Chest: Effort normal and breath sounds normal. No respiratory distress. She has no wheezes.  Abdominal: Soft. Bowel sounds are normal. She exhibits no distension.  Musculoskeletal: Normal range of motion. She exhibits no edema.       Lumbar back: She exhibits tenderness and spasm.  Lymphadenopathy:    She has cervical adenopathy.  Neurological: She is alert. She exhibits normal muscle tone.  Skin: Skin is warm and dry. She is not diaphoretic. No pallor.  Psychiatric: She has a normal mood and affect. Her behavior is normal. Judgment and thought content normal.    Results for orders placed or performed in visit on 01/15/16  Urine Culture  Result Value Ref Range   Organism ID, Bacteria NO GROWTH   POCT urinalysis dipstick  Result Value Ref Range   Color, UA yellow    Clarity, UA clear    Glucose, UA neg    Bilirubin, UA neg    Ketones, UA neg    Spec Grav, UA 1.015    Blood, UA neg    pH, UA 7.5    Protein, UA trace    Urobilinogen, UA 0.2    Nitrite, UA neg    Leukocytes, UA Trace (A) Negative  POCT Influenza A/B  Result Value Ref Range   Influenza A, POC Negative Negative   Influenza B, POC Negative Negative      Assessment & Plan:   Problem List Items Addressed This Visit      Other   Low back pain - Primary   Relevant Orders   POCT urinalysis dipstick (Completed)    Other Visit Diagnoses    Flu-like symptoms       high-risk patient; will treat for presumptive flu with Tamiflu; to ER if worse; discussed reasons,  such as secondary pneumonia; rest, hydration   Relevant Orders   POCT Influenza A/B (Completed)   Pyuria       urine culture pending at time of  visit   Relevant Orders   Urine Culture (Completed)       Follow up plan: No Follow-up on file.  An after-visit summary was printed and given to the patient at [place].  Please see the patient instructions which may contain other information and recommendations beyond what is mentioned above in the assessment and plan.  Meds ordered this encounter  Medications  . budesonide (PULMICORT) 0.5 MG/2ML nebulizer solution    Sig: Take 0.5 mg by nebulization 2 (two) times daily.   . formoterol (PERFOROMIST) 20 MCG/2ML nebulizer solution    Sig: Inhale 20 mcg into the lungs 2 (two) times daily.   Marland Kitchen atorvastatin (LIPITOR) 40 MG tablet    Sig: Take 1 tablet (40 mg total) by mouth at bedtime.    Dispense:  30 tablet    Refill:  2    Please consider 90 day supplies to promote better adherence  . rOPINIRole (REQUIP) 1 MG tablet    Sig: Take 1 tablet (1 mg total) by mouth 3 (three) times daily.    Dispense:  90 tablet    Refill:  6  . pantoprazole (PROTONIX) 20 MG tablet    Sig: Take 1 tablet (20 mg total) by mouth 2 (two) times daily.    Dispense:  60 tablet    Refill:  1  . ranitidine (ZANTAC) 300 MG tablet    Sig: Take 1 tablet (300 mg total) by mouth at bedtime.    Dispense:  30 tablet    Refill:  11    Please consider 90 day supplies to promote better adherence  . promethazine (PHENERGAN) 25 MG tablet    Sig: Take 0.5-1 tablets (12.5-25 mg total) by mouth every 6 (six) hours as needed for nausea or vomiting.    Dispense:  20 tablet    Refill:  0  . oseltamivir (TAMIFLU) 75 MG capsule    Sig: Take 1 capsule (75 mg total) by mouth 2 (two) times daily.    Dispense:  10 capsule    Refill:  0    Orders Placed This Encounter  Procedures  . Urine Culture  . POCT urinalysis dipstick  . POCT Influenza A/B

## 2016-01-16 DIAGNOSIS — R062 Wheezing: Secondary | ICD-10-CM | POA: Diagnosis not present

## 2016-01-16 DIAGNOSIS — T148XXA Other injury of unspecified body region, initial encounter: Secondary | ICD-10-CM | POA: Diagnosis not present

## 2016-01-16 DIAGNOSIS — M546 Pain in thoracic spine: Secondary | ICD-10-CM | POA: Diagnosis not present

## 2016-01-17 ENCOUNTER — Emergency Department (HOSPITAL_COMMUNITY)
Admission: EM | Admit: 2016-01-17 | Discharge: 2016-01-17 | Disposition: A | Discharge status: Home / Self Care | Payer: Medicare Other | Attending: Emergency Medicine | Admitting: Emergency Medicine

## 2016-01-17 ENCOUNTER — Encounter (HOSPITAL_COMMUNITY): Payer: Self-pay | Admitting: Emergency Medicine

## 2016-01-17 ENCOUNTER — Emergency Department (HOSPITAL_COMMUNITY): Payer: Medicare Other

## 2016-01-17 DIAGNOSIS — Y999 Unspecified external cause status: Secondary | ICD-10-CM | POA: Diagnosis not present

## 2016-01-17 DIAGNOSIS — J449 Chronic obstructive pulmonary disease, unspecified: Secondary | ICD-10-CM | POA: Diagnosis not present

## 2016-01-17 DIAGNOSIS — Y939 Activity, unspecified: Secondary | ICD-10-CM | POA: Diagnosis not present

## 2016-01-17 DIAGNOSIS — Z853 Personal history of malignant neoplasm of breast: Secondary | ICD-10-CM | POA: Diagnosis not present

## 2016-01-17 DIAGNOSIS — Z87891 Personal history of nicotine dependence: Secondary | ICD-10-CM | POA: Insufficient documentation

## 2016-01-17 DIAGNOSIS — E1121 Type 2 diabetes mellitus with diabetic nephropathy: Secondary | ICD-10-CM | POA: Diagnosis not present

## 2016-01-17 DIAGNOSIS — Z955 Presence of coronary angioplasty implant and graft: Secondary | ICD-10-CM | POA: Insufficient documentation

## 2016-01-17 DIAGNOSIS — I509 Heart failure, unspecified: Secondary | ICD-10-CM | POA: Diagnosis not present

## 2016-01-17 DIAGNOSIS — S20211A Contusion of right front wall of thorax, initial encounter: Secondary | ICD-10-CM | POA: Diagnosis not present

## 2016-01-17 DIAGNOSIS — I110 Hypertensive heart disease with heart failure: Secondary | ICD-10-CM | POA: Diagnosis not present

## 2016-01-17 DIAGNOSIS — R109 Unspecified abdominal pain: Secondary | ICD-10-CM | POA: Diagnosis not present

## 2016-01-17 DIAGNOSIS — Z96643 Presence of artificial hip joint, bilateral: Secondary | ICD-10-CM | POA: Diagnosis not present

## 2016-01-17 DIAGNOSIS — Z7982 Long term (current) use of aspirin: Secondary | ICD-10-CM | POA: Diagnosis not present

## 2016-01-17 DIAGNOSIS — J189 Pneumonia, unspecified organism: Secondary | ICD-10-CM | POA: Diagnosis not present

## 2016-01-17 DIAGNOSIS — Y929 Unspecified place or not applicable: Secondary | ICD-10-CM | POA: Insufficient documentation

## 2016-01-17 DIAGNOSIS — Z96652 Presence of left artificial knee joint: Secondary | ICD-10-CM | POA: Insufficient documentation

## 2016-01-17 DIAGNOSIS — E1140 Type 2 diabetes mellitus with diabetic neuropathy, unspecified: Secondary | ICD-10-CM | POA: Diagnosis not present

## 2016-01-17 DIAGNOSIS — E039 Hypothyroidism, unspecified: Secondary | ICD-10-CM | POA: Insufficient documentation

## 2016-01-17 DIAGNOSIS — Z794 Long term (current) use of insulin: Secondary | ICD-10-CM | POA: Diagnosis not present

## 2016-01-17 DIAGNOSIS — R0781 Pleurodynia: Secondary | ICD-10-CM | POA: Diagnosis not present

## 2016-01-17 DIAGNOSIS — W1830XA Fall on same level, unspecified, initial encounter: Secondary | ICD-10-CM | POA: Diagnosis not present

## 2016-01-17 DIAGNOSIS — M549 Dorsalgia, unspecified: Secondary | ICD-10-CM | POA: Diagnosis not present

## 2016-01-17 DIAGNOSIS — W19XXXA Unspecified fall, initial encounter: Secondary | ICD-10-CM

## 2016-01-17 DIAGNOSIS — S299XXA Unspecified injury of thorax, initial encounter: Secondary | ICD-10-CM | POA: Diagnosis present

## 2016-01-17 DIAGNOSIS — R259 Unspecified abnormal involuntary movements: Secondary | ICD-10-CM | POA: Diagnosis not present

## 2016-01-17 LAB — I-STAT CHEM 8, ED
BUN: 15 mg/dL (ref 6–20)
CALCIUM ION: 1.18 mmol/L (ref 1.15–1.40)
Chloride: 98 mmol/L — ABNORMAL LOW (ref 101–111)
Creatinine, Ser: 1.1 mg/dL — ABNORMAL HIGH (ref 0.44–1.00)
Glucose, Bld: 292 mg/dL — ABNORMAL HIGH (ref 65–99)
HEMATOCRIT: 30 % — AB (ref 36.0–46.0)
Hemoglobin: 10.2 g/dL — ABNORMAL LOW (ref 12.0–15.0)
Potassium: 4 mmol/L (ref 3.5–5.1)
SODIUM: 140 mmol/L (ref 135–145)
TCO2: 32 mmol/L (ref 0–100)

## 2016-01-17 LAB — URINE CULTURE: ORGANISM ID, BACTERIA: NO GROWTH

## 2016-01-17 MED ORDER — LEVOFLOXACIN 500 MG PO TABS
500.0000 mg | ORAL_TABLET | Freq: Once | ORAL | Status: AC
  Administered 2016-01-17: 500 mg via ORAL
  Filled 2016-01-17: qty 1

## 2016-01-17 MED ORDER — FENTANYL CITRATE (PF) 100 MCG/2ML IJ SOLN
100.0000 ug | Freq: Once | INTRAMUSCULAR | Status: AC
  Administered 2016-01-17: 100 ug via INTRAVENOUS
  Filled 2016-01-17: qty 2

## 2016-01-17 MED ORDER — LEVOFLOXACIN 500 MG PO TABS: 500.0000 mg | ORAL_TABLET | Freq: Every day | ORAL | 0 refills | Status: DC

## 2016-01-17 MED ORDER — HYDROCODONE-ACETAMINOPHEN 5-325 MG PO TABS: 1.0000 | ORAL_TABLET | ORAL | 0 refills | Status: DC | PRN

## 2016-01-17 MED ORDER — OXYCODONE-ACETAMINOPHEN 5-325 MG PO TABS
1.0000 | ORAL_TABLET | Freq: Once | ORAL | Status: AC
  Administered 2016-01-17: 1 via ORAL
  Filled 2016-01-17: qty 1

## 2016-01-17 MED ORDER — IOPAMIDOL (ISOVUE-300) INJECTION 61%
INTRAVENOUS | Status: AC
  Administered 2016-01-17: 100 mL
  Filled 2016-01-17: qty 100

## 2016-01-17 NOTE — ED Provider Notes (Signed)
[facility] DEPT Provider Note   CSN: [id number] Arrival date & time: 01/17/16  0014   By signing my name below, I, Bonnie Henry, attest that this documentation has been prepared under the direction and in the presence of [name], MD  Electronically Signed: Delton Henry, ED Scribe. 01/17/16. 12:37 AM.   History   Chief Complaint Chief Complaint  Patient presents with  . Fall   The history is provided by the patient. No language interpreter was used.   HPI Comments:  Bonnie Henry is a 76 y.o. female, with a hx of CAD, CHF and COPD, who presents to the Emergency Department complaining of gradual onset, moderate right lateral chest pain and right abdominal pain s/p a fall which occurred in the afternoon yesterday. Her pain is worse upon palpation. No alleviating factors noted. Pt denies a head injury, neck pain, new weakness of her extremities, hip pain, nausea, vomiting and blood thinner use. No other associated symptoms noted. Pt is on 2L of oxygen at home.   Past Medical History:  Diagnosis Date  . Anemia   . Anxiety   . Arthritis    "joints ache all over" . osteoarthritis  . Asthma   . Atrial fibrillation ([place])   . Breast cancer, left breast (HCC)    S/P mastectomy  . CAD (coronary artery disease)   . CHF (congestive heart failure) ([place])   . Chronic back pain   . Constipation 03/04/2015  . COPD (chronic obstructive pulmonary disease) ([place])   . Depression, major   . Diabetes mellitus without complication ([place])   . DVT (deep venous thrombosis) ([place])   . GERD (gastroesophageal reflux disease)   . Headache   . Heart murmur   . Hepatitis    HEP "C"  . History of blood transfusion    "when I had my mastectomy"  . History of hiatal hernia   . History of right-sided carotid endarterectomy 06/10/2015  . Hypercholesteremia   . Hypertension   . Morbid obesity ([place])   . Myocardial infarction   . Neuropathy ([place])   . Peripheral vascular disease ([place])   .  Post-surgical hypothyroidism   . Restless leg syndrome   . Shortness of breath dyspnea   . Sleep apnea    "2015 wore mask; lost weight; told didn't need mask anymore" (02/23/2014)  . Type II diabetes mellitus [facility])     Patient Active Problem List   Diagnosis Date Noted  . Renal insufficiency 08/03/2015  . Anemia, iron deficiency 07/22/2015  . Sepsis ([place]) 07/21/2015  . Aspiration pneumonia ([place]) 07/21/2015  . COPD exacerbation ([place]) 07/21/2015  . Metabolic encephalopathy [id number]  . Chronic respiratory failure with hypoxia ([place]) 07/21/2015  . History of right-sided carotid endarterectomy 06/10/2015  . Carotid stenosis 05/16/2015  . Low back pain 05/04/2015  . Frequent headaches 03/30/2015  . Memory loss, short term 03/30/2015  . Patellar subluxation 03/29/2015  . Constipation 03/04/2015  . Skin tear of right upper arm without complication [id number]  . Encounter for medication monitoring 02/20/2015  . Acute left flank pain 02/20/2015  . Knee pain, bilateral 02/08/2015  . Frequent falls 01/22/2015  . Left hip pain 11/22/2014  . 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 Chronic interstitial lung disease ([place]) 10/26/2014  . Centrilobular emphysema ([place]) 10/26/2014  . Unilateral headache 10/02/2014  . Vision blurred 09/28/2014  . Acquired exophthalmos 09/28/2014  . Diabetes mellitus with diabetic neuropathy ([place]) 08/31/2014  . Hammertoe 08/31/2014  . Foot callus 08/31/2014  .  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 Anemia 05/21/2014  . Chronic pain 03/02/2014  . Clinical depression 03/02/2014  . Acid reflux 03/02/2014  . Glaucoma 03/02/2014  . Adult hypothyroidism 03/02/2014  . Apnea, sleep 03/02/2014  . PAT (paroxysmal atrial tachycardia) ([place]) 02/24/2014  . Unstable angina ([place]) 02/23/2014  . 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 HTN (hypertension) 02/23/2014  . CAD S/P remote PCI  02/23/2014  . High cholesterol 02/23/2014  . PVD- h/o LCEA 02/23/2014  .  Obesity (BMI 30-39.9) 02/23/2014  . Aortic stenosis murmur 02/23/2014  . COPD, moderate ([place]) 07/27/2013    Past Surgical History:  Procedure Laterality Date  . ARTERY BIOPSY Right 09/29/2014   Procedure: BIOPSY TEMPORAL ARTERY;  Surgeon: Angelia Mould, MD;  Location: [facility];  Service: Vascular;  Laterality: Right;  . BARTHOLIN CYST MARSUPIALIZATION    . BREAST BIOPSY Left    Mastectomy  . CARDIAC CATHETERIZATION  2000's  . CAROTID ENDARTERECTOMY Left 2009  . CATARACT EXTRACTION W/ INTRAOCULAR LENS  IMPLANT, BILATERAL Bilateral ~ 2011  . CORONARY ANGIOPLASTY WITH STENT PLACEMENT  1990's X 2   "2; 1"  . ENDARTERECTOMY Right 05/16/2015   Procedure: ENDARTERECTOMY CAROTID;  Surgeon: Katha Cabal, MD;  Location: [facility] ORS;  Service: Vascular;  Laterality: Right;  . ESOPHAGOGASTRODUODENOSCOPY (EGD) WITH PROPOFOL N/A 12/12/2014   Procedure: ESOPHAGOGASTRODUODENOSCOPY (EGD) WITH PROPOFOL;  Surgeon: Lucilla Lame, MD;  Location: [facility] ENDOSCOPY;  Service: Endoscopy;  Laterality: N/A;  . EYE SURGERY Bilateral    Catract Extraction with IOL  . HAMMER TOE SURGERY Left    2nd and 3rd digits  . JOINT REPLACEMENT Bilateral    Total Hip Replacement  . JOINT REPLACEMENT Left    Total Knee Replacement  . LEFT HEART CATHETERIZATION WITH CORONARY ANGIOGRAM N/A 02/24/2014   Procedure: LEFT HEART CATHETERIZATION WITH CORONARY ANGIOGRAM;  Surgeon: Leonie Man, MD;  Location: [facility] CATH LAB;  Service: Cardiovascular;  Laterality: N/A;  . MASTECTOMY Left 1990's  . SHOULDER SURGERY Right    "cleaned it out; no scope"  . THYROIDECTOMY, PARTIAL  1950's  . TOE SURGERY Right    "cut piece of bone out so toe didn't dig into my foot"  . TONSILLECTOMY  1950's  . TOTAL HIP ARTHROPLASTY Bilateral 1990's  . TOTAL KNEE ARTHROPLASTY Left 1990's  . TRIGGER FINGER RELEASE Left   . TUBAL LIGATION    . VAGINAL HYSTERECTOMY     "partial"    OB History    Gravida Para Term Preterm AB Living   0 0 0 0 0       SAB TAB Ectopic Multiple Live Births   0 0 0           Home Medications    Prior to Admission medications   Medication Sig Start Date End Date Taking? Authorizing Provider  acetaminophen (TYLENOL) 500 MG tablet Take 2 tablets (1,000 mg total) by mouth every 6 (six) hours as needed for moderate pain. 05/08/15   Duffy Bruce, MD  albuterol (ACCUNEB) 0.63 MG/3ML nebulizer solution Take 1 ampule by nebulization every 6 (six) hours as needed for wheezing or shortness of breath.     Historical Provider, MD  albuterol (PROAIR HFA) 108 (90 BASE) MCG/ACT inhaler Inhale 2 puffs into the lungs every 4 (four) hours as needed for wheezing or shortness of breath.     Historical Provider, MD  aspirin EC 81 MG tablet Take 81 mg by mouth daily.    Historical Provider,  MD  atorvastatin (LIPITOR) 40 MG tablet Take 1 tablet (40 mg total) by mouth at bedtime. 01/15/16   Arnetha Courser, MD  budesonide (PULMICORT) 0.5 MG/2ML nebulizer solution Inhale into the lungs.    Historical Provider, MD  bumetanide (BUMEX) 2 MG tablet 2 mg daily.  06/09/15   Historical Provider, MD  busPIRone (BUSPAR) 15 MG tablet TAKE ONE TABLET BY MOUTH TWICE DAILY. 11/23/15   Arnetha Courser, MD  carvedilol (COREG) 12.5 MG tablet Take 12.5 mg by mouth 2 (two) times daily with a meal.  01/24/14   Historical Provider, MD  clopidogrel (PLAVIX) 75 MG tablet Take 1 tablet (75 mg total) by mouth daily. Patient taking differently: Take 75 mg by mouth daily. 90 day course started 06/08/15 06/08/15   Arnetha Courser, MD  fluticasone (FLONASE) 50 MCG/ACT nasal spray Place 2 sprays into both nostrils at bedtime.  10/16/14   Historical Provider, MD  formoterol (PERFOROMIST) 20 MCG/2ML nebulizer solution Inhale into the lungs.    Historical Provider, MD  gabapentin (NEURONTIN) 300 MG capsule TAKE ONE CAPSULE BY MOUTH TWICE DAILY 09/21/15   Arnetha Courser, MD  Hypromellose (ARTIFICIAL TEARS OP) Place 1 drop into both eyes daily.     Historical Provider, MD   insulin NPH Human (HUMULIN N,NOVOLIN N) 100 UNIT/ML injection Inject 16 Units into the skin at bedtime.  06/14/15 06/13/16  Historical Provider, MD  insulin regular (NOVOLIN R,HUMULIN R) 100 units/mL injection Inject 3-20 Units into the skin 3 (three) times daily before meals. Based on sliding scale    Historical Provider, MD  lactulose (CHRONULAC) 10 GM/15ML solution Take 15 mLs (10 g total) by mouth 2 (two) times daily as needed for mild constipation. Patient taking differently: Take 10 g by mouth at bedtime.  02/20/15   Arnetha Courser, MD  levothyroxine (SYNTHROID, LEVOTHROID) 100 MCG tablet Take 1 tablet (100 mcg total) by mouth every other day. (alternate with the 112 mcg strength) Patient taking differently: Take 100 mcg by mouth See admin instructions. Take 1 tablet (100 mcg) by mouth before breakfast Monday thru Thursday (take 112 mcg other days) 09/08/14   Arnetha Courser, MD  levothyroxine (SYNTHROID, LEVOTHROID) 112 MCG tablet Take 1 tablet (112 mcg total) by mouth every other day. (alternate with the 100 mcg strength) Patient taking differently: Take 112 mcg by mouth See admin instructions. Take 1 tablet (112 mcg) by mouth before breakfast on Friday, Saturday, Sunday (take 100 mcg other days) 09/08/14   Arnetha Courser, MD  montelukast (SINGULAIR) 10 MG tablet Take 1 tablet (10 mg total) by mouth daily. 12/27/15   Arnetha Courser, MD  nitroGLYCERIN (NITROSTAT) 0.4 MG SL tablet Place 0.4 mg under the tongue every 5 (five) minutes as needed for chest pain.    Historical Provider, MD  nystatin cream (MYCOSTATIN) Apply 1 application topically 2 (two) times daily. 08/03/15   Arnetha Courser, MD  oseltamivir (TAMIFLU) 75 MG capsule Take 1 capsule (75 mg total) by mouth 2 (two) times daily. 01/15/16 01/20/16  Arnetha Courser, MD  OXYGEN Inhale 2 L into the lungs continuous.    Historical Provider, MD  pantoprazole (PROTONIX) 20 MG tablet Take 1 tablet (20 mg total) by mouth 2 (two) times daily. 01/15/16   Arnetha Courser, MD  promethazine (PHENERGAN) 25 MG tablet Take 0.5-1 tablets (12.5-25 mg total) by mouth every 6 (six) hours as needed for nausea or vomiting. 01/15/16 01/25/16  Arnetha Courser, MD  ranitidine (ZANTAC) 300 MG tablet Take 1 tablet (300 mg total) by mouth at bedtime. 01/15/16   Arnetha Courser, MD  rOPINIRole (REQUIP) 1 MG tablet Take 1 tablet (1 mg total) by mouth 3 (three) times daily. 01/15/16   Arnetha Courser, MD  Saccharomyces boulardii (PROBIOTIC) 250 MG CAPS Take 250 mg by mouth 2 (two) times daily. 07/27/15   Belkys A Regalado, MD  sertraline (ZOLOFT) 100 MG tablet TAKE 1 AND 1/2 TABLETS BY MOUTH DAILY 09/21/15   Arnetha Courser, MD  spironolactone (ALDACTONE) 25 MG tablet Take 25 mg by mouth daily. 08/27/14   Historical Provider, MD  SYMBICORT 160-4.5 MCG/ACT inhaler INHALE TWO PUFFS BY MOUTH TWICE DAILY . RINSE MOUTH AFTER EACH USE 09/20/14   Arnetha Courser, MD  tiotropium (SPIRIVA HANDIHALER) 18 MCG inhalation capsule Place 18 mcg into inhaler and inhale daily.    Historical Provider, MD  tiZANidine (ZANAFLEX) 2 MG tablet Take 1 tablet (2 mg total) by mouth at bedtime. 04/13/15   Alda Berthold, DO    Family History Family History  Problem Relation Age of Onset  . Arthritis Mother   . Cancer Mother   . Diabetes Mother   . Heart disease Mother   . Hyperlipidemia Mother   . Hypertension Mother   . Alcohol abuse Father   . Brain cancer Father   . Alcohol abuse Brother   . Arthritis Brother   . Asthma Brother   . HIV Brother   . Cancer Brother   . Diabetes Brother   . Hyperlipidemia Brother   . Hypertension Brother   . Lung disease Brother   . Arthritis Maternal Grandmother     Social History Social History  Substance Use Topics  . Smoking status: Former Smoker    Packs/day: 1.50    Years: 44.00    Types: Cigarettes    Quit date: 01/06/1994  . Smokeless tobacco: Never Used     Comment: "quit smoking cigarettes in ~ 2000"  . Alcohol use No     Comment: 02/23/2014 "no alcohol  since 1990's"     Allergies   Vasotec [enalapril]; Enalapril; Enalapril maleate; Codeine; and Morphine and related   Review of Systems Review of Systems 10 systems reviewed and all are negative for acute change except as noted in the HPI.  Physical Exam Updated Vital Signs SpO2 98%   Physical Exam  Constitutional: She is oriented to person, place, and time. She appears well-developed and well-nourished.  HENT:  Head: Normocephalic.  Eyes: EOM are normal.  Neck: Normal range of motion.  No C-spine tenderness  Pulmonary/Chest: Effort normal. She exhibits tenderness.  Tenderness oft the right lateral chest wall.   Abdominal: She exhibits no distension. There is tenderness.  Tenderness of the right abdomen  Musculoskeletal: Normal range of motion.  FROM of the right hip.   Neurological: She is alert and oriented to person, place, and time.  Skin:  Bruising noted over the right lateral lower chest.   Psychiatric: She has a normal mood and affect.  Nursing note and vitals reviewed.    ED Treatments / Results  DIAGNOSTIC STUDIES:  Oxygen Saturation is 98% on [place], normal by my interpretation.    COORDINATION OF CARE:  12:33 AM Discussed treatment plan with pt at bedside and pt agreed to plan.  Labs (all labs ordered are listed, but only abnormal results are displayed) Labs Reviewed  I-STAT CHEM 8, ED - Abnormal; Notable for the following:  Result Value   Chloride 98 (*)    Creatinine, Ser 1.10 (*)    Glucose, Bld 292 (*)    Hemoglobin 10.2 (*)    HCT 30.0 (*)    All other components within normal limits    EKG  EKG Interpretation None       Radiology Dg Chest 2 View  Result Date: 01/17/2016 CLINICAL DATA:  Right lower rib pain after a fall earlier today. Wheezing. Home oxygen. Former smoker. EXAM: CHEST  2 VIEW COMPARISON:  07/22/2015 FINDINGS: Borderline heart size with normal pulmonary vascularity. Peribronchial thickening and central interstitial  changes consistent with chronic bronchitis. No focal airspace disease or consolidation in the lungs. No blunting of costophrenic angles. No pneumothorax. Mediastinal contours appear intact. Calcification of the aorta. Degenerative changes in the spine and shoulders. Surgical changes in the right shoulder. Visualized portions of the right ribs appear intact. IMPRESSION: Mild cardiac enlargement. Chronic bronchitic changes. No evidence of active pulmonary disease. Electronically Signed   By: Lucienne Capers M.D.   On: 01/17/2016 01:12   Ct Chest W Contrast  Result Date: 01/17/2016 CLINICAL DATA:  Right-sided abdominal pain and rib pain after a fall. EXAM: CT CHEST, ABDOMEN, AND PELVIS WITH CONTRAST TECHNIQUE: Multidetector CT imaging of the chest, abdomen and pelvis was performed following the standard protocol during bolus administration of intravenous contrast. CONTRAST:  171mL ISOVUE-300 IOPAMIDOL (ISOVUE-300) INJECTION 61% COMPARISON:  CT chest 05/23/2015.  CT abdomen and pelvis 05/08/2015. FINDINGS: CT CHEST FINDINGS Cardiovascular: No significant vascular findings. Normal heart size. No pericardial effusion. Coronary artery and aortic valve calcifications. Aortic calcification. No aortic aneurysm or dissection. Central pulmonary arteries are patent. Mediastinum/Nodes: No enlarged mediastinal, hilar, or axillary lymph nodes. Thyroid gland, trachea, and esophagus demonstrate no significant findings. Lungs/Pleura: Slight fibrosis or atelectasis in the lung periphery. Patchy tree-in-bud infiltrates in the right perihilar region likely represent bronchopneumonia. Less than 4 mm pulmonary nodule in the right base is unchanged since prior study. 5 mm central right base nodule is also unchanged. No pleural effusions. No pneumothorax. Musculoskeletal: Degenerative changes in the spine. No destructive bone lesions. No displaced rib fractures. CT ABDOMEN PELVIS FINDINGS Hepatobiliary: No focal liver abnormality is  seen. No gallstones, gallbladder wall thickening, or biliary dilatation. Pancreas: Unremarkable. No pancreatic ductal dilatation or surrounding inflammatory changes. Spleen: Normal in size without focal abnormality. Adrenals/Urinary Tract: Adrenal glands are unremarkable. Kidneys are normal, without renal calculi, focal lesion, or hydronephrosis. Bladder is unremarkable. Stomach/Bowel: Stomach is within normal limits. Appendix is not identified. No evidence of bowel wall thickening, distention, or inflammatory changes. Vascular/Lymphatic: Aortic atherosclerosis. No enlarged abdominal or pelvic lymph nodes. Reproductive: Status post hysterectomy. No adnexal masses. Other: Small periumbilical hernias containing fat. Postoperative changes consistent with prior anterior abdominal wall hernia repair. No free air or free fluid in the abdomen. Musculoskeletal: Degenerative changes in the spine. No destructive bone lesions. Sacrum, pelvis, and hips appear intact. Bilateral hip arthroplasties. IMPRESSION: No acute posttraumatic changes demonstrated in the chest, abdomen, or pelvis. Perihilar tree-in-bud infiltrates in the right lung probably represent early bronchopneumonia. No evidence of bowel obstruction or inflammation. Small periumbilical hernias containing fat. Electronically Signed   By: Lucienne Capers M.D.   On: 01/17/2016 02:31   Ct Abdomen Pelvis W Contrast  Result Date: 01/17/2016 CLINICAL DATA:  Right-sided abdominal pain and rib pain after a fall. EXAM: CT CHEST, ABDOMEN, AND PELVIS WITH CONTRAST TECHNIQUE: Multidetector CT imaging of the chest, abdomen and pelvis was performed following the standard protocol during  bolus administration of intravenous contrast. CONTRAST:  11mL ISOVUE-300 IOPAMIDOL (ISOVUE-300) INJECTION 61% COMPARISON:  CT chest 05/23/2015.  CT abdomen and pelvis 05/08/2015. FINDINGS: CT CHEST FINDINGS Cardiovascular: No significant vascular findings. Normal heart size. No pericardial  effusion. Coronary artery and aortic valve calcifications. Aortic calcification. No aortic aneurysm or dissection. Central pulmonary arteries are patent. Mediastinum/Nodes: No enlarged mediastinal, hilar, or axillary lymph nodes. Thyroid gland, trachea, and esophagus demonstrate no significant findings. Lungs/Pleura: Slight fibrosis or atelectasis in the lung periphery. Patchy tree-in-bud infiltrates in the right perihilar region likely represent bronchopneumonia. Less than 4 mm pulmonary nodule in the right base is unchanged since prior study. 5 mm central right base nodule is also unchanged. No pleural effusions. No pneumothorax. Musculoskeletal: Degenerative changes in the spine. No destructive bone lesions. No displaced rib fractures. CT ABDOMEN PELVIS FINDINGS Hepatobiliary: No focal liver abnormality is seen. No gallstones, gallbladder wall thickening, or biliary dilatation. Pancreas: Unremarkable. No pancreatic ductal dilatation or surrounding inflammatory changes. Spleen: Normal in size without focal abnormality. Adrenals/Urinary Tract: Adrenal glands are unremarkable. Kidneys are normal, without renal calculi, focal lesion, or hydronephrosis. Bladder is unremarkable. Stomach/Bowel: Stomach is within normal limits. Appendix is not identified. No evidence of bowel wall thickening, distention, or inflammatory changes. Vascular/Lymphatic: Aortic atherosclerosis. No enlarged abdominal or pelvic lymph nodes. Reproductive: Status post hysterectomy. No adnexal masses. Other: Small periumbilical hernias containing fat. Postoperative changes consistent with prior anterior abdominal wall hernia repair. No free air or free fluid in the abdomen. Musculoskeletal: Degenerative changes in the spine. No destructive bone lesions. Sacrum, pelvis, and hips appear intact. Bilateral hip arthroplasties. IMPRESSION: No acute posttraumatic changes demonstrated in the chest, abdomen, or pelvis. Perihilar tree-in-bud infiltrates in  the right lung probably represent early bronchopneumonia. No evidence of bowel obstruction or inflammation. Small periumbilical hernias containing fat. Electronically Signed   By: Lucienne Capers M.D.   On: 01/17/2016 02:31    Procedures Procedures (including critical care time)  Medications Ordered in ED Medications  levofloxacin (LEVAQUIN) tablet 500 mg (not administered)  oxyCODONE-acetaminophen (PERCOCET/ROXICET) 5-325 MG per tablet 1 tablet (1 tablet Oral Given 01/17/16 0044)  fentaNYL (SUBLIMAZE) injection 100 mcg (100 mcg Intravenous Given 01/17/16 0044)  iopamidol (ISOVUE-300) 61 % injection (100 mLs  Contrast Given 01/17/16 0157)     Initial Impression / Assessment and Plan / ED Course  I have reviewed the triage vital signs and the nursing notes.  Pertinent labs & imaging results that were available during my care of the patient were reviewed by me and considered in my medical decision making (see chart for details).  Clinical Course     Pain improved in the emergency department.  CT imaging of the chest and pelvis without acute traumatic abnormality.  There is questionable as to whether or not she could be developing early bronchopneumonia with tree-in-bud formation noted on CT.  Patient be covered with Levaquin.  Primary care follow-up.  She understands to return to the ER for new or worsening symptoms.  Home with short course of pain medication for likely rib contusion.  No signs of liver laceration  Final Clinical Impressions(s) / ED Diagnoses   Final diagnoses:  Fall, initial encounter  Chest wall contusion, right, initial encounter  Abdominal pain, unspecified abdominal location  Community acquired pneumonia, unspecified laterality    New Prescriptions New Prescriptions   HYDROCODONE-ACETAMINOPHEN (NORCO/VICODIN) 5-325 MG TABLET    Take 1 tablet by mouth every 4 (four) hours as needed for moderate pain.   LEVOFLOXACIN (LEVAQUIN) 500  MG TABLET    Take 1 tablet (500  mg total) by mouth daily.    I personally performed the services described in this documentation, which was scribed in my presence. The recorded information has been reviewed and is accurate.        [name], MD 01/17/16 [phone]

## 2016-01-17 NOTE — ED Notes (Signed)
Patient transported to X-ray 

## 2016-01-17 NOTE — ED Triage Notes (Signed)
Pt arrived to ED via EMS. Reports falling earlier today around 3pm. EMS arrived earlier, pt declined further treatment. Increaser in back and ribs reported. Bruise to right thoracic rib with tenderness. Pt on home O2 at 2L. Wheezing upon auscultation.

## 2016-01-29 ENCOUNTER — Ambulatory Visit (INDEPENDENT_AMBULATORY_CARE_PROVIDER_SITE_OTHER): Payer: Medicare Other | Admitting: Family Medicine

## 2016-01-29 ENCOUNTER — Encounter: Payer: Self-pay | Admitting: Family Medicine

## 2016-01-29 VITALS — BP 118/84 | HR 75 | Temp 98.8°F | Resp 14 | Wt 202.0 lb

## 2016-01-29 DIAGNOSIS — J180 Bronchopneumonia, unspecified organism: Secondary | ICD-10-CM | POA: Diagnosis not present

## 2016-01-29 DIAGNOSIS — R0781 Pleurodynia: Secondary | ICD-10-CM | POA: Diagnosis not present

## 2016-01-29 DIAGNOSIS — R0789 Other chest pain: Secondary | ICD-10-CM

## 2016-01-29 MED ORDER — OXYCODONE-ACETAMINOPHEN 5-325 MG PO TABS: 1.0000 | ORAL_TABLET | Freq: Four times a day (QID) | ORAL | 0 refills | Status: DC | PRN

## 2016-01-29 NOTE — Patient Instructions (Signed)
Do not drink any alcohol or take any other pain medicine with the oxycodone Warm compresses against the side may be helpful for your pain Check your feet every night and notify your foot doctor of any new sores or problems

## 2016-01-29 NOTE — Progress Notes (Signed)
BP 118/84   Pulse 75   Temp 98.8 F (37.1 C) (Oral)   Resp 14   Wt 202 lb (91.6 kg)   SpO2 94%   BMI 39.45 kg/m    Subjective:    Patient ID: Bonnie Henry, female    DOB: 28-Feb-1940, 76 y.o.   MRN: [id number]  HPI: Bonnie Henry is a 76 y.o. female  Chief Complaint  Patient presents with  . Fall    2 weeks ago in bathroom fell hit side of bathtub bruised right side.  Went to ER had xrays and Ct which came back no broken bones, but did show some pneumonia.  Now complaining of RUQ pain  . Abdominal Pain    LUQ   Patient is here with her daughter Last visit, start of pneumonia, feeling better; she went to the hospital, they found start of pneumonia; they put her on levaquin She fell in the bathroom, hit the tub; whole right side of ribs and flank were black and blue They did xrays and said no fracture, but patient has ongoing pain; no pain with deep inspiration Pain keeps her from laying on her side They gave her hydrocodone and she started doing off the wall things; daughter was worried she was going to hurt herself; she tried to climb out the side of her recliner, kept fiddling; the oxycodone does not do that says the daughter and she would like some to have on hand for her pain  She is having LUQ pain, but they just did a CT scan when in the ER on January 17, 2016 ------------------------------------------- Abd/pelvis CT IMPRESSION: No acute posttraumatic changes demonstrated in the chest, abdomen, or pelvis. Perihilar tree-in-bud infiltrates in the right lung probably represent early bronchopneumonia.  No evidence of bowel obstruction or inflammation. Small periumbilical hernias containing fat.   Electronically Signed   By: Lucienne Capers M.D.   On: 01/17/2016 02:31 ------------------------------------------------  Depression screen [facility] 2/9 01/29/2016 01/15/2016 06/08/2015 05/04/2015 01/22/2015  Decreased Interest 0 0 0 0 3  Down, Depressed, Hopeless 0  1 0 0 0  PHQ - 2 Score 0 1 0 0 3  Altered sleeping - - - - 3  Tired, decreased energy - - - - 1  Change in appetite - - - - 0  Feeling bad or failure about yourself  - - - - 0  Trouble concentrating - - - - 0  Moving slowly or fidgety/restless - - - - 0  Suicidal thoughts - - - - 0  PHQ-9 Score - - - - 7  Difficult doing work/chores - - - - Somewhat difficult   Relevant past medical, surgical, family and social history reviewed Past Medical History:  Diagnosis Date  . Anemia   . Anxiety   . Arthritis    "joints ache all over" . osteoarthritis  . Asthma   . Atrial fibrillation ([place])   . Breast cancer, left breast (HCC)    S/P mastectomy  . CAD (coronary artery disease)   . CHF (congestive heart failure) ([place])   . Chronic back pain   . Constipation 03/04/2015  . COPD (chronic obstructive pulmonary disease) ([place])   . Depression, major   . Diabetes mellitus without complication ([place])   . DVT (deep venous thrombosis) ([place])   . GERD (gastroesophageal reflux disease)   . Headache   . Heart murmur   . Hepatitis    HEP "C"  . History of blood transfusion    "  when I had my mastectomy"  . History of hiatal hernia   . History of right-sided carotid endarterectomy 06/10/2015  . Hypercholesteremia   . Hypertension   . Morbid obesity ([place])   . Myocardial infarction   . Neuropathy ([place])   . Peripheral vascular disease ([place])   . Post-surgical hypothyroidism   . Restless leg syndrome   . Shortness of breath dyspnea   . Sleep apnea    "2015 wore mask; lost weight; told didn't need mask anymore" (02/23/2014)  . Type II diabetes mellitus ([place])    Past Surgical History:  Procedure Laterality Date  . ARTERY BIOPSY Right 09/29/2014   Procedure: BIOPSY TEMPORAL ARTERY;  Surgeon: Angelia Mould, MD;  Location: [place];  Service: Vascular;  Laterality: Right;  . BARTHOLIN CYST MARSUPIALIZATION    . BREAST BIOPSY Left    Mastectomy  . CARDIAC CATHETERIZATION  2000's  . CAROTID  ENDARTERECTOMY Left 2009  . CATARACT EXTRACTION W/ INTRAOCULAR LENS  IMPLANT, BILATERAL Bilateral ~ 2011  . CORONARY ANGIOPLASTY WITH STENT PLACEMENT  1990's X 2   "2; 1"  . ENDARTERECTOMY Right 05/16/2015   Procedure: ENDARTERECTOMY CAROTID;  Surgeon: Katha Cabal, MD;  Location: [facility];  Service: Vascular;  Laterality: Right;  . ESOPHAGOGASTRODUODENOSCOPY (EGD) WITH PROPOFOL N/A 12/12/2014   Procedure: ESOPHAGOGASTRODUODENOSCOPY (EGD) WITH PROPOFOL;  Surgeon: Lucilla Lame, MD;  Location: [facility];  Service: Endoscopy;  Laterality: N/A;  . EYE SURGERY Bilateral    Catract Extraction with IOL  . HAMMER TOE SURGERY Left    2nd and 3rd digits  . JOINT REPLACEMENT Bilateral    Total Hip Replacement  . JOINT REPLACEMENT Left    Total Knee Replacement  . LEFT HEART CATHETERIZATION WITH CORONARY ANGIOGRAM N/A 02/24/2014   Procedure: LEFT HEART CATHETERIZATION WITH CORONARY ANGIOGRAM;  Surgeon: Leonie Man, MD;  Location: [facility] CATH LAB;  Service: Cardiovascular;  Laterality: N/A;  . MASTECTOMY Left 1990's  . SHOULDER SURGERY Right    "cleaned it out; no scope"  . THYROIDECTOMY, PARTIAL  1950's  . TOE SURGERY Right    "cut piece of bone out so toe didn't dig into my foot"  . TONSILLECTOMY  1950's  . TOTAL HIP ARTHROPLASTY Bilateral 1990's  . TOTAL KNEE ARTHROPLASTY Left 1990's  . TRIGGER FINGER RELEASE Left   . TUBAL LIGATION    . VAGINAL HYSTERECTOMY     "partial"   Family History  Problem Relation Age of Onset  . Arthritis Mother   . Cancer Mother   . Diabetes Mother   . Heart disease Mother   . Hyperlipidemia Mother   . Hypertension Mother   . Alcohol abuse Father   . Brain cancer Father   . Alcohol abuse Brother   . Arthritis Brother   . Asthma Brother   . HIV Brother   . Cancer Brother   . Diabetes Brother   . Hyperlipidemia Brother   . Hypertension Brother   . Lung disease Brother   . Arthritis Maternal Grandmother    Social History  Substance Use Topics   . Smoking status: Former Smoker    Packs/day: 1.50    Years: 44.00    Types: Cigarettes    Quit date: 01/06/1994  . Smokeless tobacco: Never Used     Comment: "quit smoking cigarettes in ~ 2000"  . Alcohol use No     Comment: 02/23/2014 "no alcohol since 1990's"    Interim medical history since last visit reviewed. Allergies and medications  reviewed  Review of Systems Per HPI unless specifically indicated above     Objective:    BP 118/84   Pulse 75   Temp 98.8 F (37.1 C) (Oral)   Resp 14   Wt 202 lb (91.6 kg)   SpO2 94%   BMI 39.45 kg/m   Wt Readings from Last 3 Encounters:  01/29/16 202 lb (91.6 kg)  01/15/16 196 lb (88.9 kg)  08/03/15 191 lb (86.6 kg)    Physical Exam  Constitutional: She appears well-developed and well-nourished. No distress.  HENT:  Head: Normocephalic and atraumatic.  Right Ear: Hearing and external ear normal.  Left Ear: Hearing and external ear normal.  Mouth/Throat: Oropharynx is clear and moist.  Blood tinged on the right  Eyes: EOM are normal. No scleral icterus.  Neck: No thyromegaly present.  Cardiovascular: Normal rate, regular rhythm and normal heart sounds.   No murmur heard. Pulmonary/Chest: Effort normal and breath sounds normal. No respiratory distress. She has no wheezes. She has no rhonchi.    Bruising along RIGHT flank; tender; no crepitus  Abdominal: Soft. Bowel sounds are normal. She exhibits no distension.  Musculoskeletal: Normal range of motion. She exhibits no edema.       Lumbar back: She exhibits tenderness and spasm.  Lymphadenopathy:    She has cervical adenopathy.  Neurological: She is alert. She exhibits normal muscle tone.  Skin: Skin is warm and dry. Bruising (along RIGHT flank) noted. She is not diaphoretic. No pallor.  Psychiatric: She has a normal mood and affect. Her behavior is normal. Judgment and thought content normal. Her mood appears not anxious. She does not exhibit a depressed mood.   Diabetic  Foot Form - Detailed   Diabetic Foot Exam - detailed Diabetic Foot exam was performed with the following findings:  Yes 01/29/2016  2:46 PM  Visual Foot Exam completed.:  Yes  Are the toenails thick?:  Yes Are the toenails ingrown?:  No Pulse Foot Exam completed.:  Yes  Right Dorsalis Pedis:  Present Left Dorsalis Pedis:  Present  Sensory Foot Exam Completed.:  Yes Swelling:  No Semmes-Weinstein Monofilament Test R Site 1-Great Toe:  Pos L Site 1-Great Toe:  Pos  R Site 4:  Neg L Site 4:  Neg  R Site 5:  Neg L Site 5:  Neg    Comments:  2nd toe and 5th toe on right foot are bruised; callus on ball of right foot; diminished sensation bottom of both feet       Assessment & Plan:   Problem List Items Addressed This Visit    None    Visit Diagnoses    Rib pain on right side    -  Primary   discussed pain management options; Rx provided; care for fall prevention discussed   Bronchopneumonia       finish out antibiotics; reasons to call reviewed       Follow up plan: No Follow-up on file.  An after-visit summary was printed and given to the patient at [place].  Please see the patient instructions which may contain other information and recommendations beyond what is mentioned above in the assessment and plan.  Meds ordered this encounter  Medications  . oxyCODONE-acetaminophen (ROXICET) 5-325 MG tablet    Sig: Take 1 tablet by mouth every 6 (six) hours as needed for severe pain.    Dispense:  20 tablet    Refill:  0    No orders of the defined types were  placed in this encounter.

## 2016-02-04 ENCOUNTER — Ambulatory Visit: Payer: Self-pay | Admitting: Family Medicine

## 2016-03-10 DIAGNOSIS — E1165 Type 2 diabetes mellitus with hyperglycemia: Secondary | ICD-10-CM | POA: Diagnosis not present

## 2016-03-10 DIAGNOSIS — G4733 Obstructive sleep apnea (adult) (pediatric): Secondary | ICD-10-CM | POA: Diagnosis not present

## 2016-03-10 DIAGNOSIS — R0609 Other forms of dyspnea: Secondary | ICD-10-CM | POA: Diagnosis not present

## 2016-03-10 DIAGNOSIS — Z794 Long term (current) use of insulin: Secondary | ICD-10-CM | POA: Diagnosis not present

## 2016-03-10 DIAGNOSIS — E1142 Type 2 diabetes mellitus with diabetic polyneuropathy: Secondary | ICD-10-CM | POA: Diagnosis not present

## 2016-03-10 DIAGNOSIS — J449 Chronic obstructive pulmonary disease, unspecified: Secondary | ICD-10-CM | POA: Diagnosis not present

## 2016-03-20 ENCOUNTER — Other Ambulatory Visit: Payer: Self-pay | Admitting: Neurology

## 2016-03-25 DIAGNOSIS — I739 Peripheral vascular disease, unspecified: Secondary | ICD-10-CM | POA: Diagnosis not present

## 2016-03-25 DIAGNOSIS — L603 Nail dystrophy: Secondary | ICD-10-CM | POA: Diagnosis not present

## 2016-03-25 DIAGNOSIS — M792 Neuralgia and neuritis, unspecified: Secondary | ICD-10-CM | POA: Diagnosis not present

## 2016-03-25 DIAGNOSIS — E1051 Type 1 diabetes mellitus with diabetic peripheral angiopathy without gangrene: Secondary | ICD-10-CM | POA: Diagnosis not present

## 2016-04-09 DIAGNOSIS — J449 Chronic obstructive pulmonary disease, unspecified: Secondary | ICD-10-CM | POA: Diagnosis not present

## 2016-04-09 DIAGNOSIS — R0609 Other forms of dyspnea: Secondary | ICD-10-CM | POA: Diagnosis not present

## 2016-04-09 DIAGNOSIS — G4733 Obstructive sleep apnea (adult) (pediatric): Secondary | ICD-10-CM | POA: Diagnosis not present

## 2016-04-09 DIAGNOSIS — E039 Hypothyroidism, unspecified: Secondary | ICD-10-CM | POA: Diagnosis not present

## 2016-04-21 ENCOUNTER — Other Ambulatory Visit: Payer: Self-pay | Admitting: Family Medicine

## 2016-04-29 ENCOUNTER — Ambulatory Visit: Payer: Self-pay | Admitting: Family Medicine

## 2016-05-02 ENCOUNTER — Encounter: Payer: Self-pay | Admitting: Family Medicine

## 2016-05-02 ENCOUNTER — Telehealth: Payer: Self-pay

## 2016-05-02 ENCOUNTER — Ambulatory Visit (INDEPENDENT_AMBULATORY_CARE_PROVIDER_SITE_OTHER): Payer: Medicare Other | Admitting: Family Medicine

## 2016-05-02 VITALS — BP 122/74 | HR 67 | Temp 98.1°F | Resp 14

## 2016-05-02 DIAGNOSIS — R300 Dysuria: Secondary | ICD-10-CM

## 2016-05-02 DIAGNOSIS — N3289 Other specified disorders of bladder: Secondary | ICD-10-CM

## 2016-05-02 DIAGNOSIS — R252 Cramp and spasm: Secondary | ICD-10-CM

## 2016-05-02 DIAGNOSIS — N3000 Acute cystitis without hematuria: Secondary | ICD-10-CM

## 2016-05-02 DIAGNOSIS — Z1231 Encounter for screening mammogram for malignant neoplasm of breast: Secondary | ICD-10-CM | POA: Diagnosis not present

## 2016-05-02 DIAGNOSIS — Z1239 Encounter for other screening for malignant neoplasm of breast: Secondary | ICD-10-CM

## 2016-05-02 LAB — POCT URINALYSIS DIPSTICK
Bilirubin, UA: NEGATIVE
Glucose, UA: NEGATIVE
Ketones, UA: 5
NITRITE UA: NEGATIVE
PH UA: 5 (ref 5.0–8.0)
RBC UA: NEGATIVE
Spec Grav, UA: 1.02 (ref 1.010–1.025)
UROBILINOGEN UA: 0.2 U/dL

## 2016-05-02 MED ORDER — FLUCONAZOLE 150 MG PO TABS: 150.0000 mg | ORAL_TABLET | Freq: Once | ORAL | 0 refills | Status: AC

## 2016-05-02 MED ORDER — NITROFURANTOIN MONOHYD MACRO 100 MG PO CAPS: 100.0000 mg | ORAL_CAPSULE | Freq: Two times a day (BID) | ORAL | 0 refills | Status: DC

## 2016-05-02 NOTE — Progress Notes (Signed)
BP 122/74   Pulse 67   Temp 98.1 F (36.7 C) (Oral)   Resp 14   SpO2 97%    Subjective:    Patient ID: Bonnie Henry, female    DOB: 11-30-1940, 76 y.o.   MRN: [id number]  HPI: Bonnie Henry is a 76 y.o. female  Chief Complaint  Patient presents with  . Urinary Tract Infection    spsam   HPI She thinks she has a bladder infection; urinary frequency, not new, on water pill; daughter can tell urine smells strong; no fever; no blood in the urine; just spasms but no pressure, but there is pain just sitting there No recent antibiotics She had been on prednisone for a week for breathing by Dr. Raul Del She continues to have pains in her legs; has spinal stenosis; cramps too; not on metformin  Depression screen [facility] 2/9 05/02/2016 01/29/2016 01/15/2016 06/08/2015 05/04/2015  Decreased Interest 0 0 0 0 0  Down, Depressed, Hopeless 1 0 1 0 0  PHQ - 2 Score 1 0 1 0 0  Altered sleeping - - - - -  Tired, decreased energy - - - - -  Change in appetite - - - - -  Feeling bad or failure about yourself  - - - - -  Trouble concentrating - - - - -  Moving slowly or fidgety/restless - - - - -  Suicidal thoughts - - - - -  PHQ-9 Score - - - - -  Difficult doing work/chores - - - - -   Relevant past medical, surgical, family and social history reviewed Past Medical History:  Diagnosis Date  . Anemia   . Anxiety   . Arthritis    "joints ache all over" . osteoarthritis  . Asthma   . Atrial fibrillation ([place])   . Breast cancer, left breast (HCC)    S/P mastectomy  . CAD (coronary artery disease)   . CHF (congestive heart failure) ([place])   . Chronic back pain   . Constipation 03/04/2015  . COPD (chronic obstructive pulmonary disease) ([place])   . Depression, major   . Diabetes mellitus without complication ([place])   . DVT (deep venous thrombosis) ([place])   . GERD (gastroesophageal reflux disease)   . Headache   . Heart murmur   . Hepatitis    HEP "C"  . History of blood transfusion     "when I had my mastectomy"  . History of hiatal hernia   . History of right-sided carotid endarterectomy 06/10/2015  . Hypercholesteremia   . Hypertension   . Morbid obesity ([place])   . Myocardial infarction ([place])   . Neuropathy   . Peripheral vascular disease ([place])   . Post-surgical hypothyroidism   . Restless leg syndrome   . Shortness of breath dyspnea   . Sleep apnea    "2015 wore mask; lost weight; told didn't need mask anymore" (02/23/2014)  . Type II diabetes mellitus (HCC)    Family History  Problem Relation Age of Onset  . Arthritis Mother   . Cancer Mother   . Diabetes Mother   . Heart disease Mother   . Hyperlipidemia Mother   . Hypertension Mother   . Alcohol abuse Father   . Brain cancer Father   . Alcohol abuse Brother   . Arthritis Brother   . Asthma Brother   . HIV Brother   . Cancer Brother   . Diabetes Brother   . Hyperlipidemia Brother   .  Hypertension Brother   . Lung disease Brother   . Arthritis Maternal Grandmother    Social History  Substance Use Topics  . Smoking status: Former Smoker    Packs/day: 1.50    Years: 44.00    Types: Cigarettes    Quit date: 01/06/1994  . Smokeless tobacco: Never Used     Comment: "quit smoking cigarettes in ~ 2000"  . Alcohol use No     Comment: 02/23/2014 "no alcohol since 1990's"    Interim medical history since last visit reviewed. Allergies and medications reviewed  Review of Systems Per HPI unless specifically indicated above     Objective:    BP 122/74   Pulse 67   Temp 98.1 F (36.7 C) (Oral)   Resp 14   SpO2 97%   Wt Readings from Last 3 Encounters:  01/29/16 202 lb (91.6 kg)  01/15/16 196 lb (88.9 kg)  08/03/15 191 lb (86.6 kg)    Physical Exam  Constitutional: She appears well-developed and well-nourished.  obese  HENT:  Mouth/Throat: Mucous membranes are normal.  Eyes: EOM are normal. No scleral icterus.  Cardiovascular: Normal rate and regular rhythm.   Pulmonary/Chest: Effort  normal and breath sounds normal.  Abdominal: She exhibits no distension.  Neurological:  Seated in wheelchair, gait not assessed  Psychiatric: She has a normal mood and affect. Her behavior is normal. Her mood appears not anxious. She does not exhibit a depressed mood.   Diabetic Foot Form - Detailed   Diabetic Foot Exam - detailed Diabetic Foot exam was performed with the following findings:  Yes 05/02/2016  8:33 AM  Visual Foot Exam completed.:  Yes  Are the toenails ingrown?:  No Normal Range of Motion:  Yes Pulse Foot Exam completed.:  Yes  Right Dorsalis Pedis:  Present Left Dorsalis Pedis:  Present  Sensory Foot Exam Completed.:  Yes Swelling:  No Semmes-Weinstein Monofilament Test R Site 1-Great Toe:  Neg L Site 1-Great Toe:  Pos  R Site 4:  Neg L Site 4:  Pos  R Site 5:  Neg L Site 5:  Pos    Comments:  Diminished sensation to monofilament testing RIGHT foot    Results for orders placed or performed in visit on 05/02/16  POCT urinalysis dipstick  Result Value Ref Range   Color, UA dark    Clarity, UA cloudy    Glucose, UA neg    Bilirubin, UA neg    Ketones, UA 5    Spec Grav, UA 1.020 1.010 - 1.025   Blood, UA negative    pH, UA 5.0 5.0 - 8.0   Protein, UA trace    Urobilinogen, UA 0.2 0.2 or 1.0 E.U./dL   Nitrite, UA negative    Leukocytes, UA Large (3+) (A) Negative      Assessment & Plan:   Problem List Items Addressed This Visit    None    Visit Diagnoses    Acute cystitis without hematuria    -  Primary   culture pending; start antibiotics   Screening for breast cancer       Relevant Orders   MM Digital Screening   Bladder spasms       Relevant Orders   POCT urinalysis dipstick (Completed)   Burning with urination       Relevant Orders   Urine Culture   Leg cramps       try diet tonic water and gentle stretches      Follow up  plan: No Follow-up on file.  An after-visit summary was printed and given to the patient at [place].  Please see  the patient instructions which may contain other information and recommendations beyond what is mentioned above in the assessment and plan.  Meds ordered this encounter  Medications  . nitrofurantoin, macrocrystal-monohydrate, (MACROBID) 100 MG capsule    Sig: Take 1 capsule (100 mg total) by mouth 2 (two) times daily.    Dispense:  14 capsule    Refill:  0    Orders Placed This Encounter  Procedures  . Urine Culture  . MM Digital Screening  . POCT urinalysis dipstick

## 2016-05-02 NOTE — Telephone Encounter (Signed)
Pt daughter states when the pt is on antibiotics she usually gets yeast infection. Would it be possbile to have something prescribed.

## 2016-05-02 NOTE — Telephone Encounter (Signed)
Left pt a detail voicemail.

## 2016-05-02 NOTE — Patient Instructions (Signed)
Try diet tonic water, 4 ounces a day Try gentle stretches Start the antibiotics Hydrate Call if needed

## 2016-05-02 NOTE — Telephone Encounter (Signed)
We'll encourage a yogurt daily or probiotics to help prevent the yeast infection in the first place I'll send in an Rx for diflucan to use if needed If she takes the diflucan, do NOT take any cholesterol medicine for 3 days Thank you

## 2016-05-04 LAB — URINE CULTURE

## 2016-05-12 ENCOUNTER — Ambulatory Visit: Payer: Medicare Other | Attending: Specialist

## 2016-05-12 DIAGNOSIS — G4733 Obstructive sleep apnea (adult) (pediatric): Secondary | ICD-10-CM | POA: Insufficient documentation

## 2016-05-12 DIAGNOSIS — G4761 Periodic limb movement disorder: Secondary | ICD-10-CM | POA: Diagnosis not present

## 2016-05-26 ENCOUNTER — Other Ambulatory Visit: Payer: Self-pay | Admitting: Family Medicine

## 2016-05-26 DIAGNOSIS — Z9861 Coronary angioplasty status: Secondary | ICD-10-CM

## 2016-05-26 DIAGNOSIS — E1121 Type 2 diabetes mellitus with diabetic nephropathy: Secondary | ICD-10-CM

## 2016-05-26 DIAGNOSIS — E7800 Pure hypercholesterolemia, unspecified: Secondary | ICD-10-CM

## 2016-05-26 DIAGNOSIS — Z5181 Encounter for therapeutic drug level monitoring: Secondary | ICD-10-CM

## 2016-05-26 DIAGNOSIS — I2510 Atherosclerotic heart disease of native coronary artery without angina pectoris: Secondary | ICD-10-CM

## 2016-05-26 NOTE — Telephone Encounter (Signed)
Pt.notified

## 2016-05-26 NOTE — Assessment & Plan Note (Signed)
Check sgpt 

## 2016-05-26 NOTE — Telephone Encounter (Signed)
Please ask patient to have fasting labs done for her cholesterol and liver on the atorvastatin; I sent refills; thank you

## 2016-05-29 ENCOUNTER — Ambulatory Visit (INDEPENDENT_AMBULATORY_CARE_PROVIDER_SITE_OTHER): Payer: Medicare Other | Admitting: Family Medicine

## 2016-05-29 ENCOUNTER — Encounter: Payer: Self-pay | Admitting: Family Medicine

## 2016-05-29 VITALS — BP 126/72 | HR 58 | Temp 97.5°F | Resp 14 | Wt 209.0 lb

## 2016-05-29 DIAGNOSIS — R296 Repeated falls: Secondary | ICD-10-CM

## 2016-05-29 DIAGNOSIS — R2681 Unsteadiness on feet: Secondary | ICD-10-CM

## 2016-05-29 DIAGNOSIS — R112 Nausea with vomiting, unspecified: Secondary | ICD-10-CM

## 2016-05-29 DIAGNOSIS — I2510 Atherosclerotic heart disease of native coronary artery without angina pectoris: Secondary | ICD-10-CM

## 2016-05-29 DIAGNOSIS — E0500 Thyrotoxicosis with diffuse goiter without thyrotoxic crisis or storm: Secondary | ICD-10-CM

## 2016-05-29 DIAGNOSIS — Z9861 Coronary angioplasty status: Secondary | ICD-10-CM | POA: Diagnosis not present

## 2016-05-29 DIAGNOSIS — E7800 Pure hypercholesterolemia, unspecified: Secondary | ICD-10-CM

## 2016-05-29 DIAGNOSIS — E039 Hypothyroidism, unspecified: Secondary | ICD-10-CM

## 2016-05-29 DIAGNOSIS — Z5181 Encounter for therapeutic drug level monitoring: Secondary | ICD-10-CM | POA: Diagnosis not present

## 2016-05-29 DIAGNOSIS — E1121 Type 2 diabetes mellitus with diabetic nephropathy: Secondary | ICD-10-CM

## 2016-05-29 DIAGNOSIS — R140 Abdominal distension (gaseous): Secondary | ICD-10-CM | POA: Diagnosis not present

## 2016-05-29 LAB — LIPID PANEL
CHOLESTEROL: 183 mg/dL (ref <200)
HDL: 63 mg/dL (ref >50)
LDL Cholesterol: 89 mg/dL (ref <100)
Total CHOL/HDL Ratio: 2.9 Ratio (ref <5.0)
Triglycerides: 153 mg/dL — ABNORMAL HIGH (ref <150)
VLDL: 31 mg/dL — ABNORMAL HIGH (ref <30)

## 2016-05-29 LAB — ALT: ALT: 15 U/L (ref 6–29)

## 2016-05-29 MED ORDER — MECLIZINE HCL 25 MG PO TABS: 12.5000 mg | ORAL_TABLET | Freq: Three times a day (TID) | ORAL | 0 refills | Status: AC | PRN

## 2016-05-29 NOTE — Assessment & Plan Note (Signed)
Patient unfortunately has gained weight

## 2016-05-29 NOTE — Patient Instructions (Addendum)
Please ask Dr. Graceann Congress to help manage your Graves disease Contact Dr. Posey Pronto about the gait and dizziness and leaning because more testing may be needed Do not eat for 2-3 hours before bedtime We'll get the ultrasound in [facility] Check out the information at familydoctor.org entitled "Nutrition for Weight Loss: What You Need to Know about Fad Diets" Try to lose between 1 pounds per week by taking in fewer calories and burning off more calories You can succeed by limiting portions, limiting foods dense in calories and fat, becoming more active, and drinking 8 glasses of water a day (64 ounces) Don't skip meals, especially breakfast, as skipping meals may alter your metabolism Do not use over-the-counter weight loss pills or gimmicks that claim rapid weight loss A healthy BMI (or body mass index) is between 18.5 and 24.9 You can calculate your ideal BMI at the [place] website [employer]fr

## 2016-05-29 NOTE — Assessment & Plan Note (Addendum)
Patient has seen neurologist; not doing PT right now; will refer her back for additional PT; polypharmacy likely also playing a role, as well as deconditioning and morbid obesity; neurologist started her on tizanidine; will have her ask about whether or not that can be stopped

## 2016-05-29 NOTE — Progress Notes (Signed)
BP 126/72   Pulse (!) 58   Temp 97.5 F (36.4 C) (Oral)   Resp 14   Wt 209 lb (94.8 kg)   SpO2 95%   BMI 40.82 kg/m    Subjective:    Patient ID: Bonnie Henry, female    DOB: [birth year], 76 y.o.   MRN: [id number]  HPI: Bonnie Henry is a 76 y.o. female  Chief Complaint  Patient presents with  . Nausea  . Dizziness   HPI  She has been feeling sick to her stomach Started a month ago Threw up a few times It was coming and going, but more persistent now Eating does not affect the nausea She does get food stuck in the mid to upper esophagus She has been to the gastroenterologist for this, but not in a while She does not think it is related to her nausea, though She has ovaries I asked if she feels bloated, and she thought a while and then said "a little bit", not constant She has had her head scanned several times, no tumors She does say that it happens with movement of her head; watching things go by her will bring it on Laying in the bed flat, does not turn too often Graves disease; protruding a little more and drooping more Losing balance when walking; has been seen by neurologist for this; Dr. Serita Grit last note reviewed  ------------------------------------------ Brain MRI April 2017: IMPRESSION: 1. No acute or focal abnormality to explain the patient's symptoms. 2. Moderate generalized atrophy and moderate to severe diffuse white matter disease. This likely reflects the sequela of chronic microvascular ischemia. 3. Stable exophthalmos.   Electronically Signed   By: San Morelle M.D.   On: 04/25/2015 19:36 --------------------------------------- Seeing neurologist; Dr. Narda Amber; she is taking tizanidine, started by neurologist  She has high cholesterol; due for fasting labs today  Seeing Dr. Graceann Congress for diabetes; I asked how it is and she said "terrible"  Depression screen [facility] 2/9 05/02/2016 01/29/2016 01/15/2016 06/08/2015 05/04/2015    Decreased Interest 0 0 0 0 0  Down, Depressed, Hopeless 1 0 1 0 0  PHQ - 2 Score 1 0 1 0 0  Altered sleeping - - - - -  Tired, decreased energy - - - - -  Change in appetite - - - - -  Feeling bad or failure about yourself  - - - - -  Trouble concentrating - - - - -  Moving slowly or fidgety/restless - - - - -  Suicidal thoughts - - - - -  PHQ-9 Score - - - - -  Difficult doing work/chores - - - - -    Relevant past medical, surgical, family and social history reviewed Past Medical History:  Diagnosis Date  . Anemia   . Anxiety   . Arthritis    "joints ache all over" . osteoarthritis  . Asthma   . Atrial fibrillation ([place])   . Breast cancer, left breast (HCC)    S/P mastectomy  . CAD (coronary artery disease)   . CHF (congestive heart failure) ([place])   . Chronic back pain   . Constipation 03/04/2015  . COPD (chronic obstructive pulmonary disease) ([place])   . Depression, major   . Diabetes mellitus without complication ([place])   . DVT (deep venous thrombosis) ([place])   . GERD (gastroesophageal reflux disease)   . Headache   . Heart murmur   . Hepatitis    HEP "C"  . History  of blood transfusion    "when I had my mastectomy"  . History of hiatal hernia   . History of right-sided carotid endarterectomy 06/10/2015  . Hypercholesteremia   . Hypertension   . Morbid obesity ([place])   . Myocardial infarction ([place])   . Neuropathy   . Peripheral vascular disease ([place])   . Post-surgical hypothyroidism   . Restless leg syndrome   . Shortness of breath dyspnea   . Sleep apnea    "2015 wore mask; lost weight; told didn't need mask anymore" (02/23/2014)  . Type II diabetes mellitus ([place])    Past Surgical History:  Procedure Laterality Date  . ARTERY BIOPSY Right 09/29/2014   Procedure: BIOPSY TEMPORAL ARTERY;  Surgeon: Angelia Mould, MD;  Location: [facility];  Service: Vascular;  Laterality: Right;  . BARTHOLIN CYST MARSUPIALIZATION    . BREAST BIOPSY Left    Mastectomy  .  CARDIAC CATHETERIZATION  2000's  . CAROTID ENDARTERECTOMY Left 2009  . CATARACT EXTRACTION W/ INTRAOCULAR LENS  IMPLANT, BILATERAL Bilateral ~ 2011  . CORONARY ANGIOPLASTY WITH STENT PLACEMENT  1990's X 2   "2; 1"  . ENDARTERECTOMY Right 05/16/2015   Procedure: ENDARTERECTOMY CAROTID;  Surgeon: Katha Cabal, MD;  Location: [facility];  Service: Vascular;  Laterality: Right;  . ESOPHAGOGASTRODUODENOSCOPY (EGD) WITH PROPOFOL N/A 12/12/2014   Procedure: ESOPHAGOGASTRODUODENOSCOPY (EGD) WITH PROPOFOL;  Surgeon: Lucilla Lame, MD;  Location: [facility];  Service: Endoscopy;  Laterality: N/A;  . EYE SURGERY Bilateral    Catract Extraction with IOL  . HAMMER TOE SURGERY Left    2nd and 3rd digits  . JOINT REPLACEMENT Bilateral    Total Hip Replacement  . JOINT REPLACEMENT Left    Total Knee Replacement  . LEFT HEART CATHETERIZATION WITH CORONARY ANGIOGRAM N/A 02/24/2014   Procedure: LEFT HEART CATHETERIZATION WITH CORONARY ANGIOGRAM;  Surgeon: Leonie Man, MD;  Location: [facility] CATH LAB;  Service: Cardiovascular;  Laterality: N/A;  . MASTECTOMY Left 1990's  . SHOULDER SURGERY Right    "cleaned it out; no scope"  . THYROIDECTOMY, PARTIAL  1950's  . TOE SURGERY Right    "cut piece of bone out so toe didn't dig into my foot"  . TONSILLECTOMY  1950's  . TOTAL HIP ARTHROPLASTY Bilateral 1990's  . TOTAL KNEE ARTHROPLASTY Left 1990's  . TRIGGER FINGER RELEASE Left   . TUBAL LIGATION    . VAGINAL HYSTERECTOMY     "partial"   Family History  Problem Relation Age of Onset  . Arthritis Mother   . Cancer Mother   . Diabetes Mother   . Heart disease Mother   . Hyperlipidemia Mother   . Hypertension Mother   . Alcohol abuse Father   . Brain cancer Father   . Alcohol abuse Brother   . Arthritis Brother   . Asthma Brother   . HIV Brother   . Cancer Brother   . Diabetes Brother   . Hyperlipidemia Brother   . Hypertension Brother   . Lung disease Brother   . Arthritis Maternal  Grandmother    Social History   Social History  . Marital status: Widowed    Spouse name: N/A  . Number of children: N/A  . Years of education: N/A   Occupational History  . Not on file.   Social History Main Topics  . Smoking status: Former Smoker    Packs/day: 1.50    Years: 44.00    Types: Cigarettes    Quit date: 01/06/1994  .  Smokeless tobacco: Never Used     Comment: "quit smoking cigarettes in ~ 2000"  . Alcohol use No     Comment: 02/23/2014 "no alcohol since 1990's"  . Drug use: No  . Sexual activity: No   Other Topics Concern  . Not on file   Social History Narrative   Lives with daughter in a mobile home.  Has 4 children alive, 1 deceased.  Retired from [employer].  Education: high school.    Interim medical history since last visit reviewed. Allergies and medications reviewed  Review of Systems Per HPI unless specifically indicated above     Objective:    BP 126/72   Pulse (!) 58   Temp 97.5 F (36.4 C) (Oral)   Resp 14   Wt 209 lb (94.8 kg)   SpO2 95%   BMI 40.82 kg/m   Wt Readings from Last 3 Encounters:  05/29/16 209 lb (94.8 kg)  01/29/16 202 lb (91.6 kg)  01/15/16 196 lb (88.9 kg)    Physical Exam  Constitutional: She appears well-developed and well-nourished.  Morbidly obese  HENT:  Mouth/Throat: Mucous membranes are normal.  Eyes: EOM are normal. No scleral icterus. Right eye exhibits normal extraocular motion and no nystagmus. Left eye exhibits normal extraocular motion and no nystagmus.  Exophthalmos bilaterally, right perhaps a little more noticeable than left  Cardiovascular: Normal rate and regular rhythm.   Pulmonary/Chest: Effort normal and breath sounds normal.  Abdominal: She exhibits no distension.  Neurological: She is alert.  Seated in wheelchair, gait not assessed  Psychiatric: She has a normal mood and affect. Her behavior is normal. Her mood appears not anxious. She does not exhibit a depressed mood.   Diabetic Foot  Form - Detailed   Diabetic Foot Exam - detailed Diabetic Foot exam was performed with the following findings:  Yes 05/29/2016  8:41 AM  Visual Foot Exam completed.:  Yes  Normal Range of Motion:  Yes Pulse Foot Exam completed.:  Yes  Right Dorsalis Pedis:  Present Left Dorsalis Pedis:  Present  Sensory Foot Exam Completed.:  Yes Swelling:  No Semmes-Weinstein Monofilament Test R Site 1-Great Toe:  Pos L Site 1-Great Toe:  Pos  R Site 4:  Pos L Site 4:  Pos  R Site 5:  Pos L Site 5:  Pos    Comments:  2nd toenail right foot thickened     Results for orders placed or performed in visit on 05/02/16  Urine Culture  Result Value Ref Range   Organism ID, Bacteria      Three or more organisms present,each greater than 10,000 CFU/mL.These organisms,commonly found on external and internal genitalia,are considered to be colonizers.No further testing performed.   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)    Managed by endocrinologist; foot exam by MD here today      Graves' disease with exophthalmos    Will ask endocrinologist to manage her Graves disease      Relevant Orders   Ambulatory referral to Endocrinology   Adult hypothyroidism    Will ask endocrinologist to monitor, manage her Graves disease      Relevant Orders   Ambulatory referral to Endocrinology     Other   Morbid obesity [facility])    Patient unfortunately has gained weight      Medication  monitoring encounter   High cholesterol (Chronic)    Check lipids today      Frequent falls    Patient has seen neurologist; not doing PT right now; will refer her back for additional PT; polypharmacy likely also playing a role, as well as deconditioning and morbid obesity; neurologist started her on tizanidine; will have her ask about whether or not that can be stopped       Other Visit Diagnoses    Non-intractable vomiting with nausea, unspecified vomiting type    -  Primary   Relevant Orders   US Transvaginal Non-OB   US Pelvis Complete   Abdominal bloating       Relevant Orders   US Transvaginal Non-OB   US Pelvis Complete   Gait instability       Relevant Orders   Ambulatory referral to Physical Therapy       Follow up plan: No Follow-up on file.  An after-visit summary was printed and given to the patient at [place].  Please see the patient instructions which may contain other information and recommendations beyond what is mentioned above in the assessment and plan.  Meds ordered this encounter  Medications  . meclizine (ANTIVERT) 25 MG tablet    Sig: Take 0.5-1 tablets (12.5-25 mg total) by mouth 3 (three) times daily as needed for dizziness.    Dispense:  30 tablet    Refill:  0    Orders Placed This Encounter  Procedures  . US Transvaginal Non-OB  . US Pelvis Complete  . Ambulatory referral to Physical Therapy  . Ambulatory referral to Endocrinology

## 2016-05-29 NOTE — Assessment & Plan Note (Signed)
Will ask endocrinologist to monitor, manage her Graves disease

## 2016-05-29 NOTE — Assessment & Plan Note (Signed)
Check lipids today 

## 2016-05-29 NOTE — Assessment & Plan Note (Signed)
Will ask endocrinologist to manage her Graves disease

## 2016-05-29 NOTE — Assessment & Plan Note (Signed)
Managed by endocrinologist; foot exam by MD here today 

## 2016-06-03 ENCOUNTER — Other Ambulatory Visit: Payer: Self-pay | Admitting: Family Medicine

## 2016-06-03 ENCOUNTER — Telehealth: Payer: Self-pay | Admitting: Family Medicine

## 2016-06-03 DIAGNOSIS — E7800 Pure hypercholesterolemia, unspecified: Secondary | ICD-10-CM

## 2016-06-03 DIAGNOSIS — Z5181 Encounter for therapeutic drug level monitoring: Secondary | ICD-10-CM

## 2016-06-03 DIAGNOSIS — I2510 Atherosclerotic heart disease of native coronary artery without angina pectoris: Secondary | ICD-10-CM

## 2016-06-03 DIAGNOSIS — Z9861 Coronary angioplasty status: Principal | ICD-10-CM

## 2016-06-03 DIAGNOSIS — I6523 Occlusion and stenosis of bilateral carotid arteries: Secondary | ICD-10-CM

## 2016-06-03 MED ORDER — ATORVASTATIN CALCIUM 80 MG PO TABS: 80.0000 mg | ORAL_TABLET | Freq: Every day | ORAL | 0 refills | Status: DC

## 2016-06-03 NOTE — Telephone Encounter (Signed)
Tamecca (daughter) states that pt is asking that you up the dosage on her cholesterol medication. Due to Roselyn Reef leaving a message last week asking would she be willing to up the dosage and pt is willing. Please send it to [employer] [phone]

## 2016-06-03 NOTE — Telephone Encounter (Signed)
Higher dose of med sent; orders entered for labs in 6 weeks

## 2016-06-04 ENCOUNTER — Telehealth: Payer: Self-pay | Admitting: Family Medicine

## 2016-06-04 NOTE — Telephone Encounter (Signed)
Lilia Pro said the plain bloating would count for paying for the [place] of pelvis She will get this tomorrow

## 2016-06-05 ENCOUNTER — Ambulatory Visit: Admission: RE | Admit: 2016-06-05 | Payer: Medicare Other | Source: Ambulatory Visit

## 2016-06-09 DIAGNOSIS — Z79899 Other long term (current) drug therapy: Secondary | ICD-10-CM | POA: Diagnosis not present

## 2016-06-09 DIAGNOSIS — E1165 Type 2 diabetes mellitus with hyperglycemia: Secondary | ICD-10-CM | POA: Diagnosis not present

## 2016-06-09 DIAGNOSIS — Z794 Long term (current) use of insulin: Secondary | ICD-10-CM | POA: Diagnosis not present

## 2016-06-09 DIAGNOSIS — E039 Hypothyroidism, unspecified: Secondary | ICD-10-CM | POA: Diagnosis not present

## 2016-06-09 DIAGNOSIS — E1142 Type 2 diabetes mellitus with diabetic polyneuropathy: Secondary | ICD-10-CM | POA: Diagnosis not present

## 2016-06-10 ENCOUNTER — Telehealth: Payer: Self-pay | Admitting: Family Medicine

## 2016-06-10 NOTE — Telephone Encounter (Signed)
PT DAUGHTER SAID THAT IF YOU ARE WANTING THE PATIENT TO HAVE PHYSICAL THERAPY YOU WILL NEED TO GET SOMEONE THAT CAN COME TO HER ( THE PATIENTS HOUSE.)

## 2016-06-11 NOTE — Telephone Encounter (Signed)
That's fine for home PT Please verify if she is taking her plavix

## 2016-06-11 NOTE — Telephone Encounter (Signed)
Spoke with pt daughter and she states its not with her current  Medicines therefore she is not taking the medicine.

## 2016-06-11 NOTE — Telephone Encounter (Signed)
Please contact her vascular doctor and make sure they are aware she isn't taking and make sure that's okay If he says stop, okay to remove from med list If not, please ask them to communicate with daughter/patient directly about this med and how long she's supposed to take it Thank you

## 2016-06-12 NOTE — Telephone Encounter (Signed)
Spoke with Bonnie Henry, Dr. Ubaldo Glassing Nurse at [facility] cardiology. She states it's not on her med list and the last time she took it was September 2008. On our end it was reported that she's has not taken Plavix on 01/11/20018. I will remove it off her med list. Pt is no longer on Plavix.

## 2016-06-12 NOTE — Addendum Note (Signed)
Addended by: Bud Face N on: 06/12/2016 11:26 AM   Modules accepted: Orders

## 2016-06-17 DIAGNOSIS — E1142 Type 2 diabetes mellitus with diabetic polyneuropathy: Secondary | ICD-10-CM | POA: Diagnosis not present

## 2016-06-17 DIAGNOSIS — I110 Hypertensive heart disease with heart failure: Secondary | ICD-10-CM | POA: Diagnosis not present

## 2016-06-17 DIAGNOSIS — Z794 Long term (current) use of insulin: Secondary | ICD-10-CM | POA: Diagnosis not present

## 2016-06-17 DIAGNOSIS — M545 Low back pain: Secondary | ICD-10-CM | POA: Diagnosis not present

## 2016-06-17 DIAGNOSIS — I480 Paroxysmal atrial fibrillation: Secondary | ICD-10-CM | POA: Diagnosis not present

## 2016-06-17 DIAGNOSIS — D509 Iron deficiency anemia, unspecified: Secondary | ICD-10-CM | POA: Diagnosis not present

## 2016-06-17 DIAGNOSIS — Z7951 Long term (current) use of inhaled steroids: Secondary | ICD-10-CM | POA: Diagnosis not present

## 2016-06-17 DIAGNOSIS — E1151 Type 2 diabetes mellitus with diabetic peripheral angiopathy without gangrene: Secondary | ICD-10-CM | POA: Diagnosis not present

## 2016-06-17 DIAGNOSIS — G4730 Sleep apnea, unspecified: Secondary | ICD-10-CM | POA: Diagnosis not present

## 2016-06-17 DIAGNOSIS — F419 Anxiety disorder, unspecified: Secondary | ICD-10-CM | POA: Diagnosis not present

## 2016-06-17 DIAGNOSIS — M6281 Muscle weakness (generalized): Secondary | ICD-10-CM | POA: Diagnosis not present

## 2016-06-17 DIAGNOSIS — F329 Major depressive disorder, single episode, unspecified: Secondary | ICD-10-CM | POA: Diagnosis not present

## 2016-06-17 DIAGNOSIS — J439 Emphysema, unspecified: Secondary | ICD-10-CM | POA: Diagnosis not present

## 2016-06-17 DIAGNOSIS — E1121 Type 2 diabetes mellitus with diabetic nephropathy: Secondary | ICD-10-CM | POA: Diagnosis not present

## 2016-06-17 DIAGNOSIS — I509 Heart failure, unspecified: Secondary | ICD-10-CM | POA: Diagnosis not present

## 2016-06-17 DIAGNOSIS — I2510 Atherosclerotic heart disease of native coronary artery without angina pectoris: Secondary | ICD-10-CM | POA: Diagnosis not present

## 2016-06-17 DIAGNOSIS — M25561 Pain in right knee: Secondary | ICD-10-CM | POA: Diagnosis not present

## 2016-06-17 DIAGNOSIS — J9611 Chronic respiratory failure with hypoxia: Secondary | ICD-10-CM | POA: Diagnosis not present

## 2016-06-17 DIAGNOSIS — Z86718 Personal history of other venous thrombosis and embolism: Secondary | ICD-10-CM | POA: Diagnosis not present

## 2016-06-17 DIAGNOSIS — G8929 Other chronic pain: Secondary | ICD-10-CM | POA: Diagnosis not present

## 2016-06-17 DIAGNOSIS — E0500 Thyrotoxicosis with diffuse goiter without thyrotoxic crisis or storm: Secondary | ICD-10-CM | POA: Diagnosis not present

## 2016-06-17 DIAGNOSIS — H409 Unspecified glaucoma: Secondary | ICD-10-CM | POA: Diagnosis not present

## 2016-06-17 DIAGNOSIS — G2581 Restless legs syndrome: Secondary | ICD-10-CM | POA: Diagnosis not present

## 2016-06-17 DIAGNOSIS — I252 Old myocardial infarction: Secondary | ICD-10-CM | POA: Diagnosis not present

## 2016-06-18 ENCOUNTER — Telehealth: Payer: Self-pay

## 2016-06-18 NOTE — Telephone Encounter (Signed)
Home health called states she needs order for hospital bed due to sleeping in recliner.  She is not able to sleep flat and needs head elevated in order to breath. Fax signed order to F: [phone] [facility].  Is this ok

## 2016-06-18 NOTE — Telephone Encounter (Signed)
That's fine; please write out and I'll sign Dx: COPD, sleep apnea, hypoxia Thank you

## 2016-06-23 DIAGNOSIS — G8929 Other chronic pain: Secondary | ICD-10-CM | POA: Diagnosis not present

## 2016-06-23 DIAGNOSIS — M6281 Muscle weakness (generalized): Secondary | ICD-10-CM | POA: Diagnosis not present

## 2016-06-23 DIAGNOSIS — M25561 Pain in right knee: Secondary | ICD-10-CM | POA: Diagnosis not present

## 2016-06-23 DIAGNOSIS — I110 Hypertensive heart disease with heart failure: Secondary | ICD-10-CM | POA: Diagnosis not present

## 2016-06-23 DIAGNOSIS — E1142 Type 2 diabetes mellitus with diabetic polyneuropathy: Secondary | ICD-10-CM | POA: Diagnosis not present

## 2016-06-23 DIAGNOSIS — M545 Low back pain: Secondary | ICD-10-CM | POA: Diagnosis not present

## 2016-06-25 DIAGNOSIS — M6281 Muscle weakness (generalized): Secondary | ICD-10-CM | POA: Diagnosis not present

## 2016-06-25 DIAGNOSIS — I110 Hypertensive heart disease with heart failure: Secondary | ICD-10-CM | POA: Diagnosis not present

## 2016-06-25 DIAGNOSIS — G8929 Other chronic pain: Secondary | ICD-10-CM | POA: Diagnosis not present

## 2016-06-25 DIAGNOSIS — E1142 Type 2 diabetes mellitus with diabetic polyneuropathy: Secondary | ICD-10-CM | POA: Diagnosis not present

## 2016-06-25 DIAGNOSIS — M545 Low back pain: Secondary | ICD-10-CM | POA: Diagnosis not present

## 2016-06-25 DIAGNOSIS — M25561 Pain in right knee: Secondary | ICD-10-CM | POA: Diagnosis not present

## 2016-06-27 DIAGNOSIS — M6281 Muscle weakness (generalized): Secondary | ICD-10-CM | POA: Diagnosis not present

## 2016-06-27 DIAGNOSIS — M25561 Pain in right knee: Secondary | ICD-10-CM | POA: Diagnosis not present

## 2016-06-27 DIAGNOSIS — E1142 Type 2 diabetes mellitus with diabetic polyneuropathy: Secondary | ICD-10-CM | POA: Diagnosis not present

## 2016-06-27 DIAGNOSIS — I110 Hypertensive heart disease with heart failure: Secondary | ICD-10-CM | POA: Diagnosis not present

## 2016-06-27 DIAGNOSIS — M545 Low back pain: Secondary | ICD-10-CM | POA: Diagnosis not present

## 2016-06-27 DIAGNOSIS — G8929 Other chronic pain: Secondary | ICD-10-CM | POA: Diagnosis not present

## 2016-07-02 DIAGNOSIS — M545 Low back pain: Secondary | ICD-10-CM | POA: Diagnosis not present

## 2016-07-02 DIAGNOSIS — M25561 Pain in right knee: Secondary | ICD-10-CM | POA: Diagnosis not present

## 2016-07-02 DIAGNOSIS — M6281 Muscle weakness (generalized): Secondary | ICD-10-CM | POA: Diagnosis not present

## 2016-07-02 DIAGNOSIS — G8929 Other chronic pain: Secondary | ICD-10-CM | POA: Diagnosis not present

## 2016-07-02 DIAGNOSIS — E1142 Type 2 diabetes mellitus with diabetic polyneuropathy: Secondary | ICD-10-CM | POA: Diagnosis not present

## 2016-07-02 DIAGNOSIS — I110 Hypertensive heart disease with heart failure: Secondary | ICD-10-CM | POA: Diagnosis not present

## 2016-07-06 ENCOUNTER — Emergency Department (HOSPITAL_COMMUNITY): Payer: Medicare Other

## 2016-07-06 ENCOUNTER — Inpatient Hospital Stay (HOSPITAL_COMMUNITY)
Admission: EM | Admit: 2016-07-06 | Discharge: 2016-07-10 | DRG: 190 | Disposition: A | Discharge status: Home Health | Payer: Medicare Other | Attending: Family Medicine | Admitting: Family Medicine

## 2016-07-06 ENCOUNTER — Encounter (HOSPITAL_COMMUNITY): Payer: Self-pay

## 2016-07-06 DIAGNOSIS — Z8349 Family history of other endocrine, nutritional and metabolic diseases: Secondary | ICD-10-CM

## 2016-07-06 DIAGNOSIS — Z794 Long term (current) use of insulin: Secondary | ICD-10-CM | POA: Diagnosis not present

## 2016-07-06 DIAGNOSIS — Z8249 Family history of ischemic heart disease and other diseases of the circulatory system: Secondary | ICD-10-CM

## 2016-07-06 DIAGNOSIS — Z808 Family history of malignant neoplasm of other organs or systems: Secondary | ICD-10-CM

## 2016-07-06 DIAGNOSIS — E039 Hypothyroidism, unspecified: Secondary | ICD-10-CM | POA: Diagnosis not present

## 2016-07-06 DIAGNOSIS — Z79899 Other long term (current) drug therapy: Secondary | ICD-10-CM

## 2016-07-06 DIAGNOSIS — Z90711 Acquired absence of uterus with remaining cervical stump: Secondary | ICD-10-CM

## 2016-07-06 DIAGNOSIS — Z8261 Family history of arthritis: Secondary | ICD-10-CM

## 2016-07-06 DIAGNOSIS — Z5329 Procedure and treatment not carried out because of patient's decision for other reasons: Secondary | ICD-10-CM

## 2016-07-06 DIAGNOSIS — Z6841 Body Mass Index (BMI) 40.0 and over, adult: Secondary | ICD-10-CM

## 2016-07-06 DIAGNOSIS — J440 Chronic obstructive pulmonary disease with acute lower respiratory infection: Secondary | ICD-10-CM | POA: Diagnosis present

## 2016-07-06 DIAGNOSIS — I2510 Atherosclerotic heart disease of native coronary artery without angina pectoris: Secondary | ICD-10-CM | POA: Diagnosis not present

## 2016-07-06 DIAGNOSIS — Z955 Presence of coronary angioplasty implant and graft: Secondary | ICD-10-CM

## 2016-07-06 DIAGNOSIS — Z811 Family history of alcohol abuse and dependence: Secondary | ICD-10-CM

## 2016-07-06 DIAGNOSIS — E1121 Type 2 diabetes mellitus with diabetic nephropathy: Secondary | ICD-10-CM | POA: Diagnosis present

## 2016-07-06 DIAGNOSIS — I739 Peripheral vascular disease, unspecified: Secondary | ICD-10-CM | POA: Diagnosis present

## 2016-07-06 DIAGNOSIS — I110 Hypertensive heart disease with heart failure: Secondary | ICD-10-CM | POA: Diagnosis not present

## 2016-07-06 DIAGNOSIS — J8489 Other specified interstitial pulmonary diseases: Secondary | ICD-10-CM | POA: Diagnosis present

## 2016-07-06 DIAGNOSIS — J9621 Acute and chronic respiratory failure with hypoxia: Secondary | ICD-10-CM | POA: Diagnosis present

## 2016-07-06 DIAGNOSIS — I482 Chronic atrial fibrillation: Secondary | ICD-10-CM | POA: Diagnosis present

## 2016-07-06 DIAGNOSIS — I252 Old myocardial infarction: Secondary | ICD-10-CM

## 2016-07-06 DIAGNOSIS — G4733 Obstructive sleep apnea (adult) (pediatric): Secondary | ICD-10-CM | POA: Diagnosis present

## 2016-07-06 DIAGNOSIS — R011 Cardiac murmur, unspecified: Secondary | ICD-10-CM | POA: Diagnosis present

## 2016-07-06 DIAGNOSIS — F32A Depression, unspecified: Secondary | ICD-10-CM | POA: Diagnosis present

## 2016-07-06 DIAGNOSIS — Z9861 Coronary angioplasty status: Secondary | ICD-10-CM

## 2016-07-06 DIAGNOSIS — Z79890 Hormone replacement therapy (postmenopausal): Secondary | ICD-10-CM

## 2016-07-06 DIAGNOSIS — I10 Essential (primary) hypertension: Secondary | ICD-10-CM | POA: Diagnosis not present

## 2016-07-06 DIAGNOSIS — Z96643 Presence of artificial hip joint, bilateral: Secondary | ICD-10-CM | POA: Diagnosis present

## 2016-07-06 DIAGNOSIS — G4730 Sleep apnea, unspecified: Secondary | ICD-10-CM | POA: Diagnosis present

## 2016-07-06 DIAGNOSIS — J449 Chronic obstructive pulmonary disease, unspecified: Secondary | ICD-10-CM | POA: Diagnosis present

## 2016-07-06 DIAGNOSIS — Z836 Family history of other diseases of the respiratory system: Secondary | ICD-10-CM

## 2016-07-06 DIAGNOSIS — J9611 Chronic respiratory failure with hypoxia: Secondary | ICD-10-CM

## 2016-07-06 DIAGNOSIS — N289 Disorder of kidney and ureter, unspecified: Secondary | ICD-10-CM | POA: Diagnosis not present

## 2016-07-06 DIAGNOSIS — E7800 Pure hypercholesterolemia, unspecified: Secondary | ICD-10-CM | POA: Diagnosis present

## 2016-07-06 DIAGNOSIS — Z9010 Acquired absence of unspecified breast and nipple: Secondary | ICD-10-CM

## 2016-07-06 DIAGNOSIS — M545 Low back pain: Secondary | ICD-10-CM | POA: Diagnosis present

## 2016-07-06 DIAGNOSIS — E1151 Type 2 diabetes mellitus with diabetic peripheral angiopathy without gangrene: Secondary | ICD-10-CM | POA: Diagnosis present

## 2016-07-06 DIAGNOSIS — Z853 Personal history of malignant neoplasm of breast: Secondary | ICD-10-CM

## 2016-07-06 DIAGNOSIS — Z825 Family history of asthma and other chronic lower respiratory diseases: Secondary | ICD-10-CM

## 2016-07-06 DIAGNOSIS — E890 Postprocedural hypothyroidism: Secondary | ICD-10-CM | POA: Diagnosis present

## 2016-07-06 DIAGNOSIS — G2581 Restless legs syndrome: Secondary | ICD-10-CM | POA: Diagnosis present

## 2016-07-06 DIAGNOSIS — E1165 Type 2 diabetes mellitus with hyperglycemia: Secondary | ICD-10-CM | POA: Diagnosis present

## 2016-07-06 DIAGNOSIS — E785 Hyperlipidemia, unspecified: Secondary | ICD-10-CM | POA: Diagnosis present

## 2016-07-06 DIAGNOSIS — E0500 Thyrotoxicosis with diffuse goiter without thyrotoxic crisis or storm: Secondary | ICD-10-CM | POA: Diagnosis present

## 2016-07-06 DIAGNOSIS — R0602 Shortness of breath: Secondary | ICD-10-CM | POA: Diagnosis not present

## 2016-07-06 DIAGNOSIS — Z9842 Cataract extraction status, left eye: Secondary | ICD-10-CM

## 2016-07-06 DIAGNOSIS — Z888 Allergy status to other drugs, medicaments and biological substances status: Secondary | ICD-10-CM

## 2016-07-06 DIAGNOSIS — J189 Pneumonia, unspecified organism: Secondary | ICD-10-CM | POA: Diagnosis not present

## 2016-07-06 DIAGNOSIS — F329 Major depressive disorder, single episode, unspecified: Secondary | ICD-10-CM | POA: Diagnosis present

## 2016-07-06 DIAGNOSIS — R000 Tachycardia, unspecified: Secondary | ICD-10-CM | POA: Diagnosis present

## 2016-07-06 DIAGNOSIS — G8929 Other chronic pain: Secondary | ICD-10-CM | POA: Diagnosis present

## 2016-07-06 DIAGNOSIS — R05 Cough: Secondary | ICD-10-CM | POA: Diagnosis not present

## 2016-07-06 DIAGNOSIS — Z885 Allergy status to narcotic agent status: Secondary | ICD-10-CM

## 2016-07-06 DIAGNOSIS — R0789 Other chest pain: Secondary | ICD-10-CM | POA: Diagnosis not present

## 2016-07-06 DIAGNOSIS — Z830 Family history of human immunodeficiency virus [HIV] disease: Secondary | ICD-10-CM

## 2016-07-06 DIAGNOSIS — Z86718 Personal history of other venous thrombosis and embolism: Secondary | ICD-10-CM

## 2016-07-06 DIAGNOSIS — J441 Chronic obstructive pulmonary disease with (acute) exacerbation: Secondary | ICD-10-CM | POA: Diagnosis not present

## 2016-07-06 DIAGNOSIS — I5022 Chronic systolic (congestive) heart failure: Secondary | ICD-10-CM | POA: Diagnosis not present

## 2016-07-06 DIAGNOSIS — D509 Iron deficiency anemia, unspecified: Secondary | ICD-10-CM | POA: Diagnosis present

## 2016-07-06 DIAGNOSIS — Z9981 Dependence on supplemental oxygen: Secondary | ICD-10-CM

## 2016-07-06 DIAGNOSIS — Z96652 Presence of left artificial knee joint: Secondary | ICD-10-CM | POA: Diagnosis present

## 2016-07-06 DIAGNOSIS — Z9841 Cataract extraction status, right eye: Secondary | ICD-10-CM

## 2016-07-06 DIAGNOSIS — H0520 Unspecified exophthalmos: Secondary | ICD-10-CM | POA: Diagnosis present

## 2016-07-06 DIAGNOSIS — Z833 Family history of diabetes mellitus: Secondary | ICD-10-CM

## 2016-07-06 DIAGNOSIS — K5909 Other constipation: Secondary | ICD-10-CM | POA: Diagnosis present

## 2016-07-06 DIAGNOSIS — Z7982 Long term (current) use of aspirin: Secondary | ICD-10-CM

## 2016-07-06 DIAGNOSIS — Z7951 Long term (current) use of inhaled steroids: Secondary | ICD-10-CM

## 2016-07-06 DIAGNOSIS — B9789 Other viral agents as the cause of diseases classified elsewhere: Secondary | ICD-10-CM | POA: Diagnosis present

## 2016-07-06 DIAGNOSIS — Z8619 Personal history of other infectious and parasitic diseases: Secondary | ICD-10-CM

## 2016-07-06 DIAGNOSIS — Z961 Presence of intraocular lens: Secondary | ICD-10-CM | POA: Diagnosis present

## 2016-07-06 DIAGNOSIS — K219 Gastro-esophageal reflux disease without esophagitis: Secondary | ICD-10-CM | POA: Diagnosis present

## 2016-07-06 DIAGNOSIS — Z87891 Personal history of nicotine dependence: Secondary | ICD-10-CM

## 2016-07-06 DIAGNOSIS — E1140 Type 2 diabetes mellitus with diabetic neuropathy, unspecified: Secondary | ICD-10-CM | POA: Diagnosis present

## 2016-07-06 DIAGNOSIS — F419 Anxiety disorder, unspecified: Secondary | ICD-10-CM | POA: Diagnosis present

## 2016-07-06 LAB — RESPIRATORY PANEL BY PCR
ADENOVIRUS-RVPPCR: NOT DETECTED
Bordetella pertussis: NOT DETECTED
CORONAVIRUS HKU1-RVPPCR: NOT DETECTED
CORONAVIRUS NL63-RVPPCR: NOT DETECTED
Chlamydophila pneumoniae: NOT DETECTED
Coronavirus 229E: NOT DETECTED
Coronavirus OC43: NOT DETECTED
Influenza A: NOT DETECTED
Influenza B: NOT DETECTED
METAPNEUMOVIRUS-RVPPCR: NOT DETECTED
Mycoplasma pneumoniae: NOT DETECTED
PARAINFLUENZA VIRUS 2-RVPPCR: NOT DETECTED
PARAINFLUENZA VIRUS 3-RVPPCR: NOT DETECTED
PARAINFLUENZA VIRUS 4-RVPPCR: NOT DETECTED
Parainfluenza Virus 1: NOT DETECTED
RHINOVIRUS / ENTEROVIRUS - RVPPCR: DETECTED — AB
Respiratory Syncytial Virus: NOT DETECTED

## 2016-07-06 LAB — CBC WITH DIFFERENTIAL/PLATELET
BASOS ABS: 0 10*3/uL (ref 0.0–0.1)
BASOS PCT: 0 %
EOS ABS: 0.1 10*3/uL (ref 0.0–0.7)
EOS PCT: 1 %
HCT: 37 % (ref 36.0–46.0)
HEMOGLOBIN: 11.7 g/dL — AB (ref 12.0–15.0)
Lymphocytes Relative: 11 %
Lymphs Abs: 1.4 10*3/uL (ref 0.7–4.0)
MCH: 29 pg (ref 26.0–34.0)
MCHC: 31.6 g/dL (ref 30.0–36.0)
MCV: 91.8 fL (ref 78.0–100.0)
Monocytes Absolute: 0.7 10*3/uL (ref 0.1–1.0)
Monocytes Relative: 6 %
NEUTROS PCT: 82 %
Neutro Abs: 10 10*3/uL — ABNORMAL HIGH (ref 1.7–7.7)
PLATELETS: 158 10*3/uL (ref 150–400)
RBC: 4.03 MIL/uL (ref 3.87–5.11)
RDW: 13.9 % (ref 11.5–15.5)
WBC: 12.2 10*3/uL — AB (ref 4.0–10.5)

## 2016-07-06 LAB — GLUCOSE, CAPILLARY
Glucose-Capillary: 156 mg/dL — ABNORMAL HIGH (ref 65–99)
Glucose-Capillary: 216 mg/dL — ABNORMAL HIGH (ref 65–99)

## 2016-07-06 LAB — LACTIC ACID, PLASMA
Lactic Acid, Venous: 1 mmol/L (ref 0.5–1.9)
Lactic Acid, Venous: 0.8 mmol/L (ref 0.5–1.9)

## 2016-07-06 LAB — BASIC METABOLIC PANEL
Anion gap: 12 (ref 5–15)
BUN: 15 mg/dL (ref 6–20)
CALCIUM: 9 mg/dL (ref 8.9–10.3)
CHLORIDE: 102 mmol/L (ref 101–111)
CO2: 26 mmol/L (ref 22–32)
CREATININE: 1.01 mg/dL — AB (ref 0.44–1.00)
GFR, EST NON AFRICAN AMERICAN: 53 mL/min — AB (ref >60)
Glucose, Bld: 138 mg/dL — ABNORMAL HIGH (ref 65–99)
Potassium: 4.1 mmol/L (ref 3.5–5.1)
SODIUM: 140 mmol/L (ref 135–145)

## 2016-07-06 LAB — I-STAT TROPONIN, ED: TROPONIN I, POC: 0 ng/mL (ref 0.00–0.08)

## 2016-07-06 LAB — MRSA PCR SCREENING: MRSA by PCR: NEGATIVE

## 2016-07-06 MED ORDER — PANTOPRAZOLE SODIUM 20 MG PO TBEC: 20.0000 mg | DELAYED_RELEASE_TABLET | Freq: Two times a day (BID) | ORAL | Status: DC

## 2016-07-06 MED ORDER — SENNOSIDES-DOCUSATE SODIUM 8.6-50 MG PO TABS: 1.0000 | ORAL_TABLET | Freq: Every evening | ORAL | Status: DC | PRN

## 2016-07-06 MED ORDER — PANTOPRAZOLE SODIUM 20 MG PO TBEC
20.0000 mg | DELAYED_RELEASE_TABLET | Freq: Two times a day (BID) | ORAL | Status: DC
  Administered 2016-07-06 – 2016-07-10 (×8): 20 mg via ORAL
  Filled 2016-07-06 (×8): qty 1

## 2016-07-06 MED ORDER — ROPINIROLE HCL 1 MG PO TABS
1.0000 mg | ORAL_TABLET | Freq: Three times a day (TID) | ORAL | Status: DC
  Administered 2016-07-06 – 2016-07-10 (×12): 1 mg via ORAL
  Filled 2016-07-06 (×14): qty 1

## 2016-07-06 MED ORDER — IPRATROPIUM-ALBUTEROL 0.5-2.5 (3) MG/3ML IN SOLN
3.0000 mL | Freq: Once | RESPIRATORY_TRACT | Status: AC
  Administered 2016-07-06: 3 mL via RESPIRATORY_TRACT
  Filled 2016-07-06: qty 3

## 2016-07-06 MED ORDER — LEVOTHYROXINE SODIUM 100 MCG PO TABS
100.0000 ug | ORAL_TABLET | ORAL | Status: DC
  Administered 2016-07-07 – 2016-07-10 (×4): 100 ug via ORAL
  Filled 2016-07-06 (×5): qty 1

## 2016-07-06 MED ORDER — ALBUTEROL SULFATE (2.5 MG/3ML) 0.083% IN NEBU: 2.5000 mg | INHALATION_SOLUTION | Freq: Four times a day (QID) | RESPIRATORY_TRACT | Status: DC | PRN

## 2016-07-06 MED ORDER — SODIUM CHLORIDE 0.9 % IV BOLUS (SEPSIS)
500.0000 mL | Freq: Once | INTRAVENOUS | Status: AC
  Administered 2016-07-06: 500 mL via INTRAVENOUS

## 2016-07-06 MED ORDER — ATORVASTATIN CALCIUM 80 MG PO TABS
80.0000 mg | ORAL_TABLET | Freq: Every day | ORAL | Status: DC
  Administered 2016-07-06 – 2016-07-09 (×4): 80 mg via ORAL
  Filled 2016-07-06 (×4): qty 1

## 2016-07-06 MED ORDER — LEVOFLOXACIN IN D5W 500 MG/100ML IV SOLN
500.0000 mg | INTRAVENOUS | Status: DC
  Administered 2016-07-06 – 2016-07-07 (×2): 500 mg via INTRAVENOUS
  Filled 2016-07-06 (×3): qty 100

## 2016-07-06 MED ORDER — LEVOTHYROXINE SODIUM 112 MCG PO TABS: 112.0000 ug | ORAL_TABLET | ORAL | Status: DC

## 2016-07-06 MED ORDER — IPRATROPIUM-ALBUTEROL 0.5-2.5 (3) MG/3ML IN SOLN
3.0000 mL | RESPIRATORY_TRACT | Status: DC
  Administered 2016-07-06 – 2016-07-07 (×7): 3 mL via RESPIRATORY_TRACT
  Filled 2016-07-06 (×7): qty 3

## 2016-07-06 MED ORDER — GABAPENTIN 300 MG PO CAPS
300.0000 mg | ORAL_CAPSULE | Freq: Two times a day (BID) | ORAL | Status: DC
  Administered 2016-07-06 – 2016-07-08 (×5): 300 mg via ORAL
  Filled 2016-07-06 (×5): qty 1

## 2016-07-06 MED ORDER — METHYLPREDNISOLONE SODIUM SUCC 125 MG IJ SOLR
125.0000 mg | Freq: Once | INTRAMUSCULAR | Status: AC
  Administered 2016-07-06: 125 mg via INTRAVENOUS
  Filled 2016-07-06: qty 2

## 2016-07-06 MED ORDER — CARVEDILOL 12.5 MG PO TABS
12.5000 mg | ORAL_TABLET | Freq: Two times a day (BID) | ORAL | Status: DC
  Administered 2016-07-06 – 2016-07-10 (×8): 12.5 mg via ORAL
  Filled 2016-07-06 (×9): qty 1

## 2016-07-06 MED ORDER — ONDANSETRON HCL 4 MG/2ML IJ SOLN: 4.0000 mg | Freq: Four times a day (QID) | INTRAMUSCULAR | Status: DC | PRN

## 2016-07-06 MED ORDER — ACETAMINOPHEN 500 MG PO TABS
1000.0000 mg | ORAL_TABLET | Freq: Four times a day (QID) | ORAL | Status: DC | PRN
  Administered 2016-07-07 – 2016-07-09 (×2): 1000 mg via ORAL
  Filled 2016-07-06 (×2): qty 2

## 2016-07-06 MED ORDER — SPIRONOLACTONE 25 MG PO TABS
25.0000 mg | ORAL_TABLET | Freq: Every day | ORAL | Status: DC
  Filled 2016-07-06: qty 1

## 2016-07-06 MED ORDER — BUMETANIDE 0.5 MG PO TABS
0.5000 mg | ORAL_TABLET | Freq: Every day | ORAL | Status: DC
  Administered 2016-07-06 – 2016-07-10 (×5): 0.5 mg via ORAL
  Filled 2016-07-06 (×6): qty 1

## 2016-07-06 MED ORDER — SERTRALINE HCL 50 MG PO TABS
150.0000 mg | ORAL_TABLET | Freq: Every day | ORAL | Status: DC
  Filled 2016-07-06: qty 1

## 2016-07-06 MED ORDER — BUSPIRONE HCL 15 MG PO TABS
15.0000 mg | ORAL_TABLET | Freq: Two times a day (BID) | ORAL | Status: DC
  Administered 2016-07-06 – 2016-07-10 (×9): 15 mg via ORAL
  Filled 2016-07-06 (×9): qty 1

## 2016-07-06 MED ORDER — ONDANSETRON HCL 4 MG PO TABS: 4.0000 mg | ORAL_TABLET | Freq: Four times a day (QID) | ORAL | Status: DC | PRN

## 2016-07-06 MED ORDER — MONTELUKAST SODIUM 10 MG PO TABS
10.0000 mg | ORAL_TABLET | Freq: Every day | ORAL | Status: DC
  Filled 2016-07-06: qty 1

## 2016-07-06 MED ORDER — GUAIFENESIN ER 600 MG PO TB12: 600.0000 mg | ORAL_TABLET | Freq: Two times a day (BID) | ORAL | Status: DC | PRN

## 2016-07-06 MED ORDER — INSULIN ASPART 100 UNIT/ML ~~LOC~~ SOLN
0.0000 [IU] | Freq: Three times a day (TID) | SUBCUTANEOUS | Status: DC
  Administered 2016-07-06: 2 [IU] via SUBCUTANEOUS
  Administered 2016-07-07: 9 [IU] via SUBCUTANEOUS
  Administered 2016-07-07: 5 [IU] via SUBCUTANEOUS
  Administered 2016-07-07 – 2016-07-08 (×2): 7 [IU] via SUBCUTANEOUS
  Administered 2016-07-08 – 2016-07-09 (×4): 9 [IU] via SUBCUTANEOUS
  Administered 2016-07-09: 5 [IU] via SUBCUTANEOUS
  Administered 2016-07-10: 9 [IU] via SUBCUTANEOUS
  Administered 2016-07-10: 5 [IU] via SUBCUTANEOUS

## 2016-07-06 MED ORDER — METHYLPREDNISOLONE SODIUM SUCC 125 MG IJ SOLR
60.0000 mg | Freq: Three times a day (TID) | INTRAMUSCULAR | Status: DC
  Administered 2016-07-06 – 2016-07-10 (×12): 60 mg via INTRAVENOUS
  Filled 2016-07-06 (×12): qty 2

## 2016-07-06 MED ORDER — SERTRALINE HCL 50 MG PO TABS
150.0000 mg | ORAL_TABLET | Freq: Every day | ORAL | Status: DC
  Administered 2016-07-06 – 2016-07-10 (×5): 150 mg via ORAL
  Filled 2016-07-06 (×4): qty 1

## 2016-07-06 MED ORDER — ASPIRIN EC 81 MG PO TBEC
81.0000 mg | DELAYED_RELEASE_TABLET | Freq: Every day | ORAL | Status: DC
  Administered 2016-07-07 – 2016-07-10 (×4): 81 mg via ORAL
  Filled 2016-07-06 (×4): qty 1

## 2016-07-06 MED ORDER — ALBUTEROL SULFATE 0.63 MG/3ML IN NEBU: 1.0000 | INHALATION_SOLUTION | Freq: Four times a day (QID) | RESPIRATORY_TRACT | Status: DC | PRN

## 2016-07-06 MED ORDER — HEPARIN SODIUM (PORCINE) 5000 UNIT/ML IJ SOLN
5000.0000 [IU] | Freq: Three times a day (TID) | INTRAMUSCULAR | Status: DC
  Administered 2016-07-06 – 2016-07-10 (×13): 5000 [IU] via SUBCUTANEOUS
  Filled 2016-07-06 (×12): qty 1

## 2016-07-06 MED ORDER — SPIRONOLACTONE 25 MG PO TABS
25.0000 mg | ORAL_TABLET | Freq: Every day | ORAL | Status: DC
  Administered 2016-07-06 – 2016-07-10 (×5): 25 mg via ORAL
  Filled 2016-07-06 (×4): qty 1

## 2016-07-06 MED ORDER — BISACODYL 10 MG RE SUPP: 10.0000 mg | Freq: Every day | RECTAL | Status: DC | PRN

## 2016-07-06 MED ORDER — NITROGLYCERIN 0.4 MG SL SUBL: 0.4000 mg | SUBLINGUAL_TABLET | SUBLINGUAL | Status: DC | PRN

## 2016-07-06 MED ORDER — LACTULOSE 10 GM/15ML PO SOLN: 10.0000 g | Freq: Two times a day (BID) | ORAL | Status: DC | PRN

## 2016-07-06 MED ORDER — BUMETANIDE 0.5 MG PO TABS: 0.5000 mg | ORAL_TABLET | Freq: Every day | ORAL | Status: DC

## 2016-07-06 MED ORDER — MONTELUKAST SODIUM 10 MG PO TABS
10.0000 mg | ORAL_TABLET | Freq: Every day | ORAL | Status: DC
  Administered 2016-07-07 – 2016-07-10 (×4): 10 mg via ORAL
  Filled 2016-07-06 (×4): qty 1

## 2016-07-06 MED ORDER — MECLIZINE HCL 25 MG PO TABS
12.5000 mg | ORAL_TABLET | Freq: Three times a day (TID) | ORAL | Status: DC | PRN
  Filled 2016-07-06: qty 1

## 2016-07-06 MED ORDER — ACETAMINOPHEN 500 MG PO TABS
1000.0000 mg | ORAL_TABLET | Freq: Once | ORAL | Status: AC
  Administered 2016-07-06: 1000 mg via ORAL
  Filled 2016-07-06: qty 2

## 2016-07-06 MED ORDER — METHYLPREDNISOLONE SODIUM SUCC 125 MG IJ SOLR: 60.0000 mg | Freq: Two times a day (BID) | INTRAMUSCULAR | Status: DC

## 2016-07-06 MED ORDER — ROPINIROLE HCL 1 MG PO TABS: 1.0000 mg | ORAL_TABLET | Freq: Three times a day (TID) | ORAL | Status: DC

## 2016-07-06 MED ORDER — LEVOTHYROXINE SODIUM 100 MCG PO TABS: 100.0000 ug | ORAL_TABLET | ORAL | Status: DC

## 2016-07-06 NOTE — ED Provider Notes (Signed)
Emergency Department Provider Note   I have reviewed the triage vital signs and the nursing notes.   HISTORY  Chief Complaint Shortness of Breath   HPI Bonnie Henry is a 76 y.o. female with PMH of COPD on 2L home O2, CHF, a-fib, DM, GERD, HTN, HLD, and obesity presents to the emergency department for evaluation of days of progressively worsening difficulty breathing. Patient has had productive cough along with her shortness of breath. Symptoms worsened significantly today and she called EMS who gave albuterol and Atrovent in route. No steroid given. Patient denies any chest pain. No radiation of symptoms. No modifying factors. She reports feeling slightly better after nebulizer given by EMS but continues to have SOB.    Past Medical History:  Diagnosis Date  . Anemia   . Anxiety   . Arthritis    "joints ache all over" . osteoarthritis  . Asthma   . Atrial fibrillation ([place])   . Breast cancer, left breast (HCC)    S/P mastectomy  . CAD (coronary artery disease)   . CHF (congestive heart failure) ([place])   . Chronic back pain   . Constipation 03/04/2015  . COPD (chronic obstructive pulmonary disease) ([place])   . Depression, major   . Diabetes mellitus without complication ([place])   . DVT (deep venous thrombosis) ([place])   . GERD (gastroesophageal reflux disease)   . Headache   . Heart murmur   . Hepatitis    HEP "C"  . History of blood transfusion    "when I had my mastectomy"  . History of hiatal hernia   . History of right-sided carotid endarterectomy 06/10/2015  . Hypercholesteremia   . Hypertension   . Morbid obesity ([place])   . Myocardial infarction ([place])   . Neuropathy   . Peripheral vascular disease ([place])   . Post-surgical hypothyroidism   . Restless leg syndrome   . Shortness of breath dyspnea   . Sleep apnea    "2015 wore mask; lost weight; told didn't need mask anymore" (02/23/2014)  . Type II diabetes mellitus [facility])     Patient Active Problem List   Diagnosis Date Noted  . Graves' disease with exophthalmos 05/29/2016  . Medication monitoring encounter 05/26/2016  . Renal insufficiency 08/03/2015  . Anemia, iron deficiency 07/22/2015  . Aspiration pneumonia ([place]) 07/21/2015  . COPD exacerbation ([place]) 07/21/2015  . Metabolic encephalopathy 25/36/6440  . Chronic respiratory failure with hypoxia ([place]) 07/21/2015  . History of right-sided carotid endarterectomy 06/10/2015  . Carotid stenosis 05/16/2015  . Low back pain 05/04/2015  . Frequent headaches 03/30/2015  . Memory loss, short term 03/30/2015  . Patellar subluxation 03/29/2015  . Constipation 03/04/2015  . Skin tear of right upper arm without complication 34/74/2595  . Encounter for medication monitoring 02/20/2015  . Acute left flank pain 02/20/2015  . Knee pain, bilateral 02/08/2015  . Frequent falls 01/22/2015  . 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 Chronic interstitial lung disease ([place]) 10/26/2014  . Centrilobular emphysema ([place]) 10/26/2014  . Vision blurred 09/28/2014  . Acquired exophthalmos 09/28/2014  . Diabetes mellitus with diabetic neuropathy ([place]) 08/31/2014  . Hammertoe 08/31/2014  . Foot callus 08/31/2014  . 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 Anemia 05/21/2014  . Chronic pain 03/02/2014  . Clinical depression 03/02/2014  . Acid reflux 03/02/2014  . Glaucoma 03/02/2014  . Adult hypothyroidism 03/02/2014  . Apnea, sleep 03/02/2014  . PAT (paroxysmal atrial tachycardia) ([place]) 02/24/2014  . Unstable angina ([place]) 02/23/2014  . Type  2 diabetes with nephropathy ([place]) 02/23/2014  . HTN (hypertension) 02/23/2014  . CAD S/P remote PCI  02/23/2014  . High cholesterol 02/23/2014  . PVD- h/o LCEA 02/23/2014  . Morbid obesity ([place]) 02/23/2014  . Aortic stenosis murmur 02/23/2014  . COPD, moderate ([place]) 07/27/2013    Past Surgical History:  Procedure Laterality Date  . ARTERY BIOPSY Right 09/29/2014   Procedure: BIOPSY TEMPORAL ARTERY;  Surgeon:  Angelia Mould, MD;  Location: [facility];  Service: Vascular;  Laterality: Right;  . BARTHOLIN CYST MARSUPIALIZATION    . BREAST BIOPSY Left    Mastectomy  . CARDIAC CATHETERIZATION  2000's  . CAROTID ENDARTERECTOMY Left 2009  . CATARACT EXTRACTION W/ INTRAOCULAR LENS  IMPLANT, BILATERAL Bilateral ~ 2011  . CORONARY ANGIOPLASTY WITH STENT PLACEMENT  1990's X 2   "2; 1"  . ENDARTERECTOMY Right 05/16/2015   Procedure: ENDARTERECTOMY CAROTID;  Surgeon: Katha Cabal, MD;  Location: [facility];  Service: Vascular;  Laterality: Right;  . ESOPHAGOGASTRODUODENOSCOPY (EGD) WITH PROPOFOL N/A 12/12/2014   Procedure: ESOPHAGOGASTRODUODENOSCOPY (EGD) WITH PROPOFOL;  Surgeon: Lucilla Lame, MD;  Location: [facility] END[facility];  Service: Endoscopy;  Laterality: N/A;  . EYE SURGERY Bilateral    Catract Extraction with IOL  . HAMMER TOE SURGERY Left    2nd and 3rd digits  . JOINT REPLACEMENT Bilateral    Total Hip Replacement  . JOINT REPLACEMENT Left    Total Knee Replacement  . LEFT HEART CATHETERIZATION WITH CORONARY ANGIOGRAM N/A 02/24/2014   Procedure: LEFT HEART CATHETERIZATION WITH CORONARY ANGIOGRAM;  Surgeon: Leonie Man, MD;  Location: [facility] CATH LAB;  Service: Cardiovascular;  Laterality: N/A;  . MASTECTOMY Left 1990's  . SHOULDER SURGERY Right    "cleaned it out; no scope"  . THYROIDECTOMY, PARTIAL  1950's  . TOE SURGERY Right    "cut piece of bone out so toe didn't dig into my foot"  . TONSILLECTOMY  1950's  . TOTAL HIP ARTHROPLASTY Bilateral 1990's  . TOTAL KNEE ARTHROPLASTY Left 1990's  . TRIGGER FINGER RELEASE Left   . TUBAL LIGATION    . VAGINAL HYSTERECTOMY     "partial"      Allergies Vasotec [enalapril]; Codeine; and Morphine and related  Family History  Problem Relation Age of Onset  . Arthritis Mother   . Cancer Mother   . Diabetes Mother   . Heart disease Mother   . Hyperlipidemia Mother   . Hypertension Mother   . Alcohol abuse Father   . Brain cancer Father     . Alcohol abuse Brother   . Arthritis Brother   . Asthma Brother   . HIV Brother   . Cancer Brother   . Diabetes Brother   . Hyperlipidemia Brother   . Hypertension Brother   . Lung disease Brother   . Arthritis Maternal Grandmother     Social History Social History  Substance Use Topics  . Smoking status: Former Smoker    Packs/day: 1.50    Years: 44.00    Types: Cigarettes    Quit date: 01/06/1994  . Smokeless tobacco: Never Used     Comment: "quit smoking cigarettes in ~ 2000"  . Alcohol use No     Comment: 02/23/2014 "no alcohol since 1990's"    Review of Systems  Constitutional: No fever/chills Eyes: No visual changes. ENT: No sore throat. Cardiovascular: Positive chest pain only with coughing.  Respiratory: Positive shortness of breath. Gastrointestinal: No abdominal pain.  No nausea, no vomiting.  No diarrhea.  No constipation. Genitourinary: Negative for dysuria. Musculoskeletal: Negative for back pain. Skin: Negative for rash. Neurological: Negative for headaches, focal weakness or numbness.  10-point ROS otherwise negative.  ____________________________________________   PHYSICAL EXAM:  VITAL SIGNS: ED Triage Vitals  Enc Vitals Group     BP 07/06/16 1116 (!) 157/114     Pulse Rate 07/06/16 1116 96     Resp 07/06/16 1116 (!) 28     Temp 07/06/16 1116 (!) 100.8 F (38.2 C)     Temp Source 07/06/16 1116 Oral     SpO2 07/06/16 1116 100 %     Pain Score 07/06/16 1106 8   Constitutional: Patient breathing rapidly with audible wheezing. Appears to be in moderate respiratory distress.  Eyes: Conjunctivae are normal. Head: Atraumatic. Nose: No congestion/rhinnorhea. Mouth/Throat: Mucous membranes are moist. Neck: No stridor. Cardiovascular: Sinus tachycardia. Good peripheral circulation. Grossly normal heart sounds.   Respiratory: Increased respiratory effort.  No retractions. Lungs with diffuse expiratory wheezing throughout.  Gastrointestinal: Soft  and nontender. No distention.  Musculoskeletal: No lower extremity tenderness nor edema. No gross deformities of extremities. Neurologic:  Normal speech and language. No gross focal neurologic deficits are appreciated.  Skin:  Skin is warm, dry and intact. No rash noted.   ____________________________________________   LABS (all labs ordered are listed, but only abnormal results are displayed)  Labs Reviewed  RESPIRATORY PANEL BY PCR - Abnormal; Notable for the following:       Result Value   Rhinovirus / Enterovirus DETECTED (*)    All other components within normal limits  BASIC METABOLIC PANEL - Abnormal; Notable for the following:    Glucose, Bld 138 (*)    Creatinine, Ser 1.01 (*)    GFR calc non Af Amer 53 (*)    All other components within normal limits  CBC WITH DIFFERENTIAL/PLATELET - Abnormal; Notable for the following:    WBC 12.2 (*)    Hemoglobin 11.7 (*)    Neutro Abs 10.0 (*)    All other components within normal limits  HEMOGLOBIN A1C - Abnormal; Notable for the following:    Hgb A1c MFr Bld 7.6 (*)    All other components within normal limits  GLUCOSE, CAPILLARY - Abnormal; Notable for the following:    Glucose-Capillary 156 (*)    All other components within normal limits  COMPREHENSIVE METABOLIC PANEL - Abnormal; Notable for the following:    Glucose, Bld 223 (*)    BUN 21 (*)    Creatinine, Ser 1.15 (*)    Calcium 8.3 (*)    Albumin 3.4 (*)    Total Bilirubin 1.5 (*)    GFR calc non Af Amer 46 (*)    GFR calc Af Amer 53 (*)    All other components within normal limits  CBC - Abnormal; Notable for the following:    RBC 3.16 (*)    Hemoglobin 9.1 (*)    HCT 28.6 (*)    Platelets 141 (*)    All other components within normal limits  GLUCOSE, CAPILLARY - Abnormal; Notable for the following:    Glucose-Capillary 216 (*)    All other components within normal limits  GLUCOSE, CAPILLARY - Abnormal; Notable for the following:    Glucose-Capillary 261 (*)     All other components within normal limits  MRSA PCR SCREENING  LACTIC ACID, PLASMA  LACTIC ACID, PLASMA  URINALYSIS, ROUTINE W REFLEX MICROSCOPIC  I-STAT TROPOININ, ED   ____________________________________________  EKG   EKG Interpretation  Date/Time:  Sunday July 06 2016 11:17:55 EDT Ventricular Rate:  90 PR Interval:    QRS Duration: 101 QT Interval:  386 QTC Calculation: 470 R Axis:   -84 Text Interpretation:  Atrial fibrillation Incomplete RBBB and LAFB Borderline ST elevation, lateral leads No STEMI.  Confirmed by Nanda Quinton [phone]) on 07/06/2016 12:13:54 PM       ____________________________________________  RADIOLOGY  Dg Chest Portable 1 View  Result Date: 07/06/2016 CLINICAL DATA:  Shortness of breath. History of COPD and hypertension. EXAM: PORTABLE CHEST 1 VIEW COMPARISON:  Chest x-ray and chest CT 01/17/2016 FINDINGS: The heart is upper limits of normal in size given the AP projection and portable technique. The mediastinal and hilar contours appear normal in stable. Moderate atherosclerotic calcifications. Chronic emphysematous and bronchitic lung changes without definite acute overlying pulmonary process. No definite pleural effusion. The bony thorax is intact. IMPRESSION: Chronic lung changes without definite acute overlying pulmonary process. Electronically Signed   By: Marijo Sanes M.D.   On: 07/06/2016 12:03    ____________________________________________   PROCEDURES  Procedure(s) performed:   Procedures  CRITICAL CARE Performed by: Margette Fast Total critical care time: 45 minutes Critical care time was exclusive of separately billable procedures and treating other patients. Critical care was necessary to treat or prevent imminent or life-threatening deterioration. Critical care was time spent personally by me on the following activities: development of treatment plan with patient and/or surrogate as well as nursing, discussions with  consultants, evaluation of patient's response to treatment, examination of patient, obtaining history from patient or surrogate, ordering and performing treatments and interventions, ordering and review of laboratory studies, ordering and review of radiographic studies, pulse oximetry and re-evaluation of patient's condition.  Nanda Quinton, MD Emergency Medicine  ____________________________________________   INITIAL IMPRESSION / ASSESSMENT AND PLAN / ED COURSE  Pertinent labs & imaging results that were available during my care of the patient were reviewed by me and considered in my medical decision making (see chart for details).  Patient resents to the emergency department for evaluation of difficulty breathing. She has a history of COPD and is on 2L [place] at home. Currently requiring 5L [place] to maintain O2 sat and appear to be in acute respiratory distress. Wheezing throughout all lung fields. Protable CXR with no focal infiltrate. Patient is febrile with unclear source of infection. EKG with a-fib but otherwise normal. Plan for additional nebulizer treatments, O2, and steroid.   Patient appears more comfortable on re-evaluation. Labs are largely unremarkable. Updated patient and family at bedside.   Discussed patient's case with Hospitalist. Patient and family (if present) updated with plan. Care transferred to Hospitalist service.  I reviewed all nursing notes, vitals, pertinent old records, EKGs, labs, imaging (as available). ____________________________________________  FINAL CLINICAL IMPRESSION(S) / ED DIAGNOSES  Final diagnoses:  COPD exacerbation ([place])     MEDICATIONS GIVEN DURING THIS VISIT:  Medications  ipratropium-albuterol (DUONEB) 0.5-2.5 (3) MG/3ML nebulizer solution 3 mL (3 mLs Nebulization Given 07/07/16 0720)  atorvastatin (LIPITOR) tablet 80 mg (80 mg Oral Given 07/06/16 2133)  meclizine (ANTIVERT) tablet 12.5-25 mg (not administered)  busPIRone (BUSPAR) tablet 15 mg  (15 mg Oral Given 07/07/16 0901)  gabapentin (NEURONTIN) capsule 300 mg (300 mg Oral Given 07/07/16 0902)  acetaminophen (TYLENOL) tablet 1,000 mg (not administered)  nitroGLYCERIN (NITROSTAT) SL tablet 0.4 mg (not administered)  carvedilol (COREG) tablet 12.5 mg (12.5 mg Oral Given 07/07/16 0827)  aspirin EC tablet 81 mg (81 mg Oral Given 07/07/16 0901)  bisacodyl (DULCOLAX) suppository 10 mg (not administered)  ondansetron (ZOFRAN) tablet 4 mg (not administered)    Or  ondansetron (ZOFRAN) injection 4 mg (not administered)  heparin injection 5,000 Units (5,000 Units Subcutaneous Given 07/07/16 0509)  insulin aspart (novoLOG) injection 0-9 Units (5 Units Subcutaneous Given 07/07/16 0820)  methylPREDNISolone sodium succinate (SOLU-MEDROL) 125 mg/2 mL injection 60 mg (60 mg Intravenous Given 07/07/16 0509)  guaiFENesin (MUCINEX) 12 hr tablet 600 mg (not administered)  levofloxacin (LEVAQUIN) IVPB 500 mg (0 mg Intravenous Stopped 07/06/16 1924)  lactulose (CHRONULAC) 10 GM/15ML solution 10 g (not administered)  senna-docusate (Senokot-S) tablet 1 tablet (not administered)  bumetanide (BUMEX) tablet 0.5 mg (0.5 mg Oral Given 07/07/16 0901)  montelukast (SINGULAIR) tablet 10 mg (10 mg Oral Given 07/07/16 0901)  pantoprazole (PROTONIX) EC tablet 20 mg (20 mg Oral Given 07/07/16 0900)  rOPINIRole (REQUIP) tablet 1 mg (1 mg Oral Given 07/07/16 0901)  sertraline (ZOLOFT) tablet 150 mg (150 mg Oral Given 07/07/16 0901)  spironolactone (ALDACTONE) tablet 25 mg (25 mg Oral Given 07/07/16 0901)  levothyroxine (SYNTHROID, LEVOTHROID) tablet 100 mcg (100 mcg Oral Given 07/07/16 0830)  levothyroxine (SYNTHROID, LEVOTHROID) tablet 112 mcg (not administered)  albuterol (PROVENTIL) (2.5 MG/3ML) 0.083% nebulizer solution 2.5 mg (not administered)  sodium chloride (OCEAN) 0.65 % nasal spray 1 spray (not administered)  insulin NPH Human (HUMULIN N,NOVOLIN N) injection 24 Units (not administered)  methylPREDNISolone sodium succinate  (SOLU-MEDROL) 125 mg/2 mL injection 125 mg (125 mg Intravenous Given 07/06/16 1141)  ipratropium-albuterol (DUONEB) 0.5-2.5 (3) MG/3ML nebulizer solution 3 mL (3 mLs Nebulization Given 07/06/16 1141)  sodium chloride 0.9 % bolus 500 mL (0 mLs Intravenous Stopped 07/06/16 1342)  acetaminophen (TYLENOL) tablet 1,000 mg (1,000 mg Oral Given 07/06/16 1345)     NEW OUTPATIENT MEDICATIONS STARTED DURING THIS VISIT:  None   Note:  This document was prepared using Dragon voice recognition software and may include unintentional dictation errors.  Nanda Quinton, MD Emergency Medicine  Long, [facility], MD 07/07/16 1130

## 2016-07-06 NOTE — ED Triage Notes (Addendum)
PER EMS: pt from home with c/o "feeling sick" for 3 days and SOB that started this morning. EMS adm 15mg  albuterol, 0.5 atrovent en route and on 6L [place]. She wears 2L of home O2 and initial O2 was 88% upon EMS arrival. Pt has been coughing up brownish/greenish phlegm. A&OX4. BP-136/100, HR-84, RR-28, CBG-112. She reports bilateral leg pain.

## 2016-07-06 NOTE — ED Notes (Signed)
Radiology at bedside

## 2016-07-06 NOTE — Progress Notes (Signed)
Pharmacy Antibiotic Note  Bonnie Henry is a 77 y.o. female admitted on 07/06/2016 with COPD exacerbation.  Pharmacy has been consulted for levaquin dosing. Tmax is 101.6 and WBC is elevated at 12.2.. Scr is 1.01.   Plan: Levaquin 500mg  IV Q24H F/u renal fxn, C&S, clinical status   Height: 5' (152.4 cm) Weight: 205 lb 14.6 oz (93.4 kg) IBW/kg (Calculated) : 45.5  Temp (24hrs), Avg:101.2 F (38.4 C), Min:100.8 F (38.2 C), Max:101.6 F (38.7 C)   Recent Labs Lab 07/06/16 1140  WBC 12.2*  CREATININE 1.01*    Estimated Creatinine Clearance: 49.9 mL/min (A) (by C-G formula based on SCr of 1.01 mg/dL (H)).    Allergies  Allergen Reactions  . Vasotec [Enalapril] Swelling    Throat swells  . Codeine Nausea And Vomiting  . Morphine And Related Nausea And Vomiting    Antimicrobials this admission: Levaquin 7/1>>  Dose adjustments this admission: N/A  Microbiology results: Pending  Thank you for allowing pharmacy to be a part of this patient's care.  Bonnie Henry, Rande Lawman 07/06/2016 3:12 PM

## 2016-07-06 NOTE — H&P (Signed)
History and Physical    Bonnie Henry JQG:[id number] DOB: 1940-03-21 DOA: 07/06/2016   PCP: Arnetha Courser, MD   Patient coming from:  Home    Chief Complaint: Shortness of breath  HPI: Bonnie Henry is a 76 y.o. female with medical history significant for COPD on O2 2 L , OSA, Afib, L breast cancer, CHF, h/o DVT, GERD, Hep C, HTN, HLD  , DM with neuropathy, hypothyroidism, presenting with 3 days history of progressive shortness of breath accompanied by green productive sputum. Denies rhinorrhea or hemoptysis. She reported subjective fevers, chills,  Denies any chest pain, chest wall pain or palpitations.Reports sick contacts in her daughter who had a "viral cold" one week ago . No recent long distant travels. Denies any abdominal pain. Has decreased appetite due to current symptoms. Reports nausea due to mucus production and dry heaves Denies dizziness or vertigo. Denies lower extremity swelling. No confusion was reported. Denies any vision changes, double vision or headaches. No tobacco.   ED Course:  BP (!) 113/34 (BP Location: Right Arm)   Pulse 76   Temp 98.2 F (36.8 C) (Oral)   Resp 13   Ht 5' (1.524 m)   Wt 93.4 kg (205 lb 14.6 oz)   SpO2 95%   BMI 40.21 kg/m    oxygen requirements at the ED increased with rise of O2 from 2 L  4.5-5 L. Received Solu-Medrol 125 in milligrams x1  received nebs x2   Symptoms  improving  WBC 12.2  Bicarb 26 Cr 1.01 Glu 138 AG 12  TN 0 Hb 11.7 Plt 158  Urine pending   Review of Systems:  As per HPI otherwise all other systems reviewed and are negative  Past Medical History:  Diagnosis Date  . Anemia   . Anxiety   . Arthritis    "joints ache all over" . osteoarthritis  . Asthma   . Atrial fibrillation ([place])   . Breast cancer, left breast (HCC)    S/P mastectomy  . CAD (coronary artery disease)   . CHF (congestive heart failure) ([place])   . Chronic back pain   . Constipation 03/04/2015  . COPD (chronic obstructive  pulmonary disease) ([place])   . Depression, major   . Diabetes mellitus without complication ([place])   . DVT (deep venous thrombosis) ([place])   . GERD (gastroesophageal reflux disease)   . Headache   . Heart murmur   . Hepatitis    HEP "C"  . History of blood transfusion    "when I had my mastectomy"  . History of hiatal hernia   . History of right-sided carotid endarterectomy 06/10/2015  . Hypercholesteremia   . Hypertension   . Morbid obesity ([employer])   . Myocardial infarction ([place])   . Neuropathy   . Peripheral vascular disease ([place])   . Post-surgical hypothyroidism   . Restless leg syndrome   . Shortness of breath dyspnea   . Sleep apnea    "2015 wore mask; lost weight; told didn't need mask anymore" (02/23/2014)  . Type II diabetes mellitus ([place])     Past Surgical History:  Procedure Laterality Date  . ARTERY BIOPSY Right 09/29/2014   Procedure: BIOPSY TEMPORAL ARTERY;  Surgeon: Angelia Mould, MD;  Location: [facility];  Service: Vascular;  Laterality: Right;  . BARTHOLIN CYST MARSUPIALIZATION    . BREAST BIOPSY Left    Mastectomy  . CARDIAC CATHETERIZATION  2000's  . CAROTID ENDARTERECTOMY Left 2009  . CATARACT EXTRACTION W/  INTRAOCULAR LENS  IMPLANT, BILATERAL Bilateral ~ 2011  . CORONARY ANGIOPLASTY WITH STENT PLACEMENT  1990's X 2   "2; 1"  . ENDARTERECTOMY Right 05/16/2015   Procedure: ENDARTERECTOMY CAROTID;  Surgeon: Katha Cabal, MD;  Location: [facility];  Service: Vascular;  Laterality: Right;  . ESOPHAGOGASTRODUODENOSCOPY (EGD) WITH PROPOFOL N/A 12/12/2014   Procedure: ESOPHAGOGASTRODUODENOSCOPY (EGD) WITH PROPOFOL;  Surgeon: Lucilla Lame, MD;  Location: [facility];  Service: Endoscopy;  Laterality: N/A;  . EYE SURGERY Bilateral    Catract Extraction with IOL  . HAMMER TOE SURGERY Left    2nd and 3rd digits  . JOINT REPLACEMENT Bilateral    Total Hip Replacement  . JOINT REPLACEMENT Left    Total Knee Replacement  . LEFT HEART CATHETERIZATION WITH CORONARY  ANGIOGRAM N/A 02/24/2014   Procedure: LEFT HEART CATHETERIZATION WITH CORONARY ANGIOGRAM;  Surgeon: Leonie Man, MD;  Location: [facility] C[facility];  Service: Cardiovascular;  Laterality: N/A;  . MASTECTOMY Left 1990's  . SHOULDER SURGERY Right    "cleaned it out; no scope"  . THYROIDECTOMY, PARTIAL  1950's  . TOE SURGERY Right    "cut piece of bone out so toe didn't dig into my foot"  . TONSILLECTOMY  1950's  . TOTAL HIP ARTHROPLASTY Bilateral 1990's  . TOTAL KNEE ARTHROPLASTY Left 1990's  . TRIGGER FINGER RELEASE Left   . TUBAL LIGATION    . VAGINAL HYSTERECTOMY     "partial"    Social History Social History   Social History  . Marital status: Widowed    Spouse name: N/A  . Number of children: N/A  . Years of education: N/A   Occupational History  . Not on file.   Social History Main Topics  . Smoking status: Former Smoker    Packs/day: 1.50    Years: 44.00    Types: Cigarettes    Quit date: 01/06/1994  . Smokeless tobacco: Never Used     Comment: "quit smoking cigarettes in ~ 2000"  . Alcohol use No     Comment: 02/23/2014 "no alcohol since 1990's"  . Drug use: No  . Sexual activity: No   Other Topics Concern  . Not on file   Social History Narrative   Lives with daughter in a mobile home.  Has 4 children alive, 1 deceased.  Retired from [employer].  Education: high school.     Allergies  Allergen Reactions  . Vasotec [Enalapril] Swelling    Throat swells  . Codeine Nausea And Vomiting  . Morphine And Related Nausea And Vomiting    Family History  Problem Relation Age of Onset  . Arthritis Mother   . Cancer Mother   . Diabetes Mother   . Heart disease Mother   . Hyperlipidemia Mother   . Hypertension Mother   . Alcohol abuse Father   . Brain cancer Father   . Alcohol abuse Brother   . Arthritis Brother   . Asthma Brother   . HIV Brother   . Cancer Brother   . Diabetes Brother   . Hyperlipidemia Brother   . Hypertension Brother   . Lung disease  Brother   . Arthritis Maternal Grandmother       Prior to Admission medications   Medication Sig Start Date End Date Taking? Authorizing Provider  acetaminophen (TYLENOL) 500 MG tablet Take 2 tablets (1,000 mg total) by mouth every 6 (six) hours as needed for moderate pain. 05/08/15  Yes Duffy Bruce, MD  albuterol (ACCUNEB) 0.63 MG/3ML nebulizer  solution Take 1 ampule by nebulization every 6 (six) hours as needed for wheezing or shortness of breath.    Yes [provider]  albuterol (PROAIR HFA) 108 (90 BASE) MCG/ACT inhaler Inhale 2 puffs into the lungs every 4 (four) hours as needed for wheezing or shortness of breath.    Yes [provider]  Ascorbic Acid (VITAMIN C PO) Take 1 tablet by mouth daily.   Yes [provider]  aspirin EC 81 MG tablet Take 81 mg by mouth daily.   Yes [provider]  atorvastatin (LIPITOR) 80 MG tablet Take 1 tablet (80 mg total) by mouth at bedtime. 06/03/16  Yes Lada, Satira Anis, MD  budesonide (PULMICORT) 0.5 MG/2ML nebulizer solution Take 0.5 mg by nebulization 2 (two) times daily.    Yes [provider]  bumetanide (BUMEX) 0.5 MG tablet Take 0.5 mg by mouth daily.   Yes [provider]  busPIRone (BUSPAR) 15 MG tablet TAKE ONE TABLET BY MOUTH TWICE DAILY. 11/23/15  Yes Lada, Satira Anis, MD  carvedilol (COREG) 12.5 MG tablet Take 12.5 mg by mouth 2 (two) times daily with a meal.  01/24/14  Yes [provider]  fluticasone (FLONASE) 50 MCG/ACT nasal spray Place 2 sprays into both nostrils at bedtime.  10/16/14  Yes [provider]  formoterol (PERFOROMIST) 20 MCG/2ML nebulizer solution Inhale 20 mcg into the lungs 2 (two) times daily.    Yes [provider]  gabapentin (NEURONTIN) 300 MG capsule TAKE ONE CAPSULE BY MOUTH TWICE DAILY Patient taking differently: TAKE 600 mg BY MOUTH TWICE DAILY 09/21/15  Yes Lada, Satira Anis, MD  Hypromellose (ARTIFICIAL TEARS OP) Place 1 drop into both  eyes daily.    Yes [provider]  insulin NPH Human (HUMULIN N,NOVOLIN N) 100 UNIT/ML injection Inject 24 Units into the skin at bedtime.  06/14/15 07/06/16 Yes [provider]  insulin regular (NOVOLIN R,HUMULIN R) 100 units/mL injection Inject 3-20 Units into the skin 3 (three) times daily before meals. Based on sliding scale   Yes [provider]  lactulose (CHRONULAC) 10 GM/15ML solution Take 15 mLs (10 g total) by mouth 2 (two) times daily as needed for mild constipation. Patient taking differently: Take 10 g by mouth at bedtime.  02/20/15  Yes Lada, Satira Anis, MD  levothyroxine (SYNTHROID, LEVOTHROID) 100 MCG tablet Take 1 tablet (100 mcg total) by mouth every other day. (alternate with the 112 mcg strength) Patient taking differently: Take 100 mcg by mouth See admin instructions. Take 1 tablet (100 mcg) by mouth before breakfast Monday thru Thursday (take 112 mcg other days) 09/08/14  Yes Lada, Satira Anis, MD  levothyroxine (SYNTHROID, LEVOTHROID) 112 MCG tablet Take 1 tablet (112 mcg total) by mouth every other day. (alternate with the 100 mcg strength) Patient taking differently: Take 112 mcg by mouth See admin instructions. Take 1 tablet (112 mcg) by mouth before breakfast on Friday, Saturday, Sunday (take 100 mcg other days) 09/08/14  Yes Lada, Satira Anis, MD  meclizine (ANTIVERT) 25 MG tablet Take 0.5-1 tablets (12.5-25 mg total) by mouth 3 (three) times daily as needed for dizziness. 05/29/16  Yes Lada, Satira Anis, MD  montelukast (SINGULAIR) 10 MG tablet Take 1 tablet (10 mg total) by mouth daily. 12/27/15  Yes Lada, Satira Anis, MD  nitroGLYCERIN (NITROSTAT) 0.4 MG SL tablet Place 0.4 mg under the tongue every 5 (five) minutes as needed for chest pain.   Yes [provider]  Omega-3 Fatty Acids ([place]  OIL) 1200 MG CAPS Take 1 capsule by mouth daily.   Yes [provider]  OXYGEN Inhale 2 L into the lungs continuous.   Yes [provider]    pantoprazole (PROTONIX) 20 MG tablet TAKE ONE TABLET BY MOUTH TWICE DAILY 06/03/16  Yes Lada, Satira Anis, MD  ranitidine (ZANTAC) 300 MG tablet Take 1 tablet (300 mg total) by mouth at bedtime. 01/15/16  Yes Lada, Satira Anis, MD  rOPINIRole (REQUIP) 1 MG tablet Take 1 tablet (1 mg total) by mouth 3 (three) times daily. 01/15/16  Yes Lada, Satira Anis, MD  sertraline (ZOLOFT) 100 MG tablet TAKE ONE & ONE-HALF TABLETS BY MOUTH ONCE DAILY. 04/22/16  Yes Lada, Satira Anis, MD  spironolactone (ALDACTONE) 25 MG tablet Take 25 mg by mouth daily. 08/27/14  Yes [provider]  tiZANidine (ZANAFLEX) 2 MG tablet TAKE ONE TABLET BY MOUTH AT BEDTIME 03/20/16  Yes Patel, Donika K, DO  promethazine (PHENERGAN) 25 MG tablet Take 0.5-1 tablets (12.5-25 mg total) by mouth every 6 (six) hours as needed for nausea or vomiting. 01/15/16 01/25/16  Arnetha Courser, MD    Physical Exam:  Vitals:   07/06/16 1415 07/06/16 1500 07/06/16 1509 07/06/16 1530  BP: (!) 122/52  (!) 113/34   Pulse: 97  76   Resp:   13   Temp:   98.2 F (36.8 C)   TempSrc:   Oral   SpO2: 97%  95% 95%  Weight:  93.4 kg (205 lb 14.6 oz)    Height:  5' (1.524 m)     Constitutional: NAD, ill appearing . Cushingoid appearance  Eyes: PERRL, lids and conjunctivae normal. Exhophtalmia  ENMT: Mucous membranes are moist, without exudate or lesions  Neck: normal, supple, no masses, no thyromegaly Respiratory:  Diffuse wheezing, rhonchi, no rales,  Normal respiratory effort at rest  Cardiovascular: Regular rate and rhythm, no murmurs, rubs or gallops. Trace  extremity edema. 2+ pedal pulses. No carotid bruits.  Abdomen: Soft, morbidly obese  non tender, No hepatosplenomegaly. Bowel sounds positive.  Musculoskeletal: no clubbing / cyanosis. Moves all extremities Skin: no jaundice, No lesions.  Neurologic: Sensation intact  Strength equal in all extremities Psychiatric:   Alert and oriented x 3.  Flat affect     Labs on Admission: I have personally  reviewed following labs and imaging studies  CBC:  Recent Labs Lab 07/06/16 1140  WBC 12.2*  NEUTROABS 10.0*  HGB 11.7*  HCT 37.0  MCV 91.8  PLT 601    Basic Metabolic Panel:  Recent Labs Lab 07/06/16 1140  NA 140  K 4.1  CL 102  CO2 26  GLUCOSE 138*  BUN 15  CREATININE 1.01*  CALCIUM 9.0    GFR: Estimated Creatinine Clearance: 49.9 mL/min (A) (by C-G formula based on SCr of 1.01 mg/dL (H)).  Liver Function Tests: No results for input(s): AST, ALT, ALKPHOS, BILITOT, PROT, ALBUMIN in the last 168 hours. No results for input(s): LIPASE, AMYLASE in the last 168 hours. No results for input(s): AMMONIA in the last 168 hours.  Coagulation Profile: No results for input(s): INR, PROTIME in the last 168 hours.  Cardiac Enzymes: No results for input(s): CKTOTAL, CKMB, CKMBINDEX, TROPONINI in the last 168 hours.  BNP (last 3 results) No results for input(s): PROBNP in the last 8760 hours.  HbA1C: No results for input(s): HGBA1C in the last 72 hours.  CBG: No results for input(s): GLUCAP in the last 168 hours.  Lipid Profile: No results for  input(s): CHOL, HDL, LDLCALC, TRIG, CHOLHDL, LDLDIRECT in the last 72 hours.  Thyroid Function Tests: No results for input(s): TSH, T4TOTAL, FREET4, T3FREE, THYROIDAB in the last 72 hours.  Anemia Panel: No results for input(s): VITAMINB12, FOLATE, FERRITIN, TIBC, IRON, RETICCTPCT in the last 72 hours.  Urine analysis:    Component Value Date/Time   COLORURINE YELLOW 07/21/2015 1352   APPEARANCEUR CLEAR 07/21/2015 1352   APPEARANCEUR Cloudy (A) 02/20/2015 0948   LABSPEC 1.023 07/21/2015 1352   LABSPEC 1.004 09/01/2013 1134   PHURINE 8.0 07/21/2015 1352   GLUCOSEU NEGATIVE 07/21/2015 1352   GLUCOSEU 50 mg/dL 09/01/2013 1134   HGBUR NEGATIVE 07/21/2015 1352   BILIRUBINUR neg 05/02/2016 0821   BILIRUBINUR Negative 02/20/2015 0948   BILIRUBINUR Negative 09/01/2013 1134   KETONESUR NEGATIVE 07/21/2015 1352   PROTEINUR  trace 05/02/2016 0821   PROTEINUR 30 (A) 07/21/2015 1352   UROBILINOGEN 0.2 05/02/2016 0821   UROBILINOGEN 0.2 10/23/2014 1008   NITRITE negative 05/02/2016 0821   NITRITE NEGATIVE 07/21/2015 1352   LEUKOCYTESUR Large (3+) (A) 05/02/2016 0821   LEUKOCYTESUR 2+ (A) 02/20/2015 0948   LEUKOCYTESUR Negative 09/01/2013 1134    Sepsis Labs: @LABRCNTIP (procalcitonin:4,lacticidven:4) )No results found for this or any previous visit (from the past 240 hour(s)).   Radiological Exams on Admission: Dg Chest Portable 1 View  Result Date: 07/06/2016 CLINICAL DATA:  Shortness of breath. History of COPD and hypertension. EXAM: PORTABLE CHEST 1 VIEW COMPARISON:  Chest x-ray and chest CT 01/17/2016 FINDINGS: The heart is upper limits of normal in size given the AP projection and portable technique. The mediastinal and hilar contours appear normal in stable. Moderate atherosclerotic calcifications. Chronic emphysematous and bronchitic lung changes without definite acute overlying pulmonary process. No definite pleural effusion. The bony thorax is intact. IMPRESSION: Chronic lung changes without definite acute overlying pulmonary process. Electronically Signed   By: Marijo Sanes M.D.   On: 07/06/2016 12:03    EKG: Independently reviewed.  Assessment/Plan Active Problems:   COPD exacerbation (HCC)   Type 2 diabetes with nephropathy (HCC)   HTN (hypertension)   CAD S/P remote PCI    High cholesterol   PVD- h/o LCEA   Morbid obesity (HCC)   Chronic pain   COPD, moderate (HCC)   Clinical depression   Acid reflux   Adult hypothyroidism   Apnea, sleep   Diabetes mellitus with diabetic neuropathy (HCC)   Acquired exophthalmos   Chronic respiratory failure with hypoxia (HCC)   Anemia, iron deficiency   Renal insufficiency   Graves' disease with exophthalmos     Acute hypoxic respiratory distress  secondary to ongoing COPD exacerbation   Osats normal at 4.5 L   WBC 12  CXR  No definite acute process   Lactic acid pending   Bicarb 26  Tele Obs  Levaquin Duonebs and Albuterol nebs  Solu-Medrol 60 mg IV every 8 h  Mucinex prn O2 CBC Blood and sputum cultures Respiratory viral panel  CXR in am  Patient will need OP follow up with her pulmonologist in [place]    Hypertension   Controlled Continue home anti-hypertensive medications    Hyperlipidemia Continue home statins   Type II Diabetes with neuropathy Current blood sugar level is 138 Lab Results  Component Value Date   HGBA1C 6.8 06/08/2015  Hgb A1C SSI Continue neurontin   Hypothyroidism: Continue home Synthroid  GERD, no acute symptoms Continue PPI   Restless leg syndrome Continue Requip  Depression Continue home  Zoloft   Chronic  systolic and CHF  CXR  BNP Last 2 D echo 02/2014  EF 60 Weight 205 Lbs  continue Coreg and diuresis  monitor I/Os and daily weights prn 02   OSA Continue CPAP nightly    DVT prophylaxis: SCD's   Code Status:   Full    Family Communication:  Discussed with patient Disposition Plan: Expect patient to be discharged to home after condition improves Consults called:    None  Admission statusSDU    [facility], PA-C [facility]   07/06/2016, 3:31 PM

## 2016-07-06 NOTE — Progress Notes (Signed)
Pt. Refused cpap. Pt. States she was told by her doctor that she doesn't need one anymore. RT informed pt. To notify if she changes her mind and decides to wear one.

## 2016-07-06 NOTE — Plan of Care (Signed)
Problem: Pain Managment: Goal: General experience of comfort will improve Outcome: Progressing Patient denies pain at this time

## 2016-07-06 NOTE — ED Notes (Signed)
Dr. Laverta [place] informed of pts temp of 101.6

## 2016-07-07 DIAGNOSIS — E1151 Type 2 diabetes mellitus with diabetic peripheral angiopathy without gangrene: Secondary | ICD-10-CM | POA: Diagnosis present

## 2016-07-07 DIAGNOSIS — B9789 Other viral agents as the cause of diseases classified elsewhere: Secondary | ICD-10-CM | POA: Diagnosis present

## 2016-07-07 DIAGNOSIS — I5022 Chronic systolic (congestive) heart failure: Secondary | ICD-10-CM | POA: Diagnosis present

## 2016-07-07 DIAGNOSIS — G2581 Restless legs syndrome: Secondary | ICD-10-CM | POA: Diagnosis present

## 2016-07-07 DIAGNOSIS — R0602 Shortness of breath: Secondary | ICD-10-CM | POA: Diagnosis not present

## 2016-07-07 DIAGNOSIS — E1121 Type 2 diabetes mellitus with diabetic nephropathy: Secondary | ICD-10-CM | POA: Diagnosis present

## 2016-07-07 DIAGNOSIS — E7800 Pure hypercholesterolemia, unspecified: Secondary | ICD-10-CM | POA: Diagnosis present

## 2016-07-07 DIAGNOSIS — M545 Low back pain: Secondary | ICD-10-CM | POA: Diagnosis present

## 2016-07-07 DIAGNOSIS — G4733 Obstructive sleep apnea (adult) (pediatric): Secondary | ICD-10-CM | POA: Diagnosis present

## 2016-07-07 DIAGNOSIS — G8929 Other chronic pain: Secondary | ICD-10-CM | POA: Diagnosis present

## 2016-07-07 DIAGNOSIS — I110 Hypertensive heart disease with heart failure: Secondary | ICD-10-CM | POA: Diagnosis present

## 2016-07-07 DIAGNOSIS — K219 Gastro-esophageal reflux disease without esophagitis: Secondary | ICD-10-CM | POA: Diagnosis not present

## 2016-07-07 DIAGNOSIS — E785 Hyperlipidemia, unspecified: Secondary | ICD-10-CM | POA: Diagnosis present

## 2016-07-07 DIAGNOSIS — J441 Chronic obstructive pulmonary disease with (acute) exacerbation: Secondary | ICD-10-CM | POA: Diagnosis not present

## 2016-07-07 DIAGNOSIS — Z6841 Body Mass Index (BMI) 40.0 and over, adult: Secondary | ICD-10-CM | POA: Diagnosis not present

## 2016-07-07 DIAGNOSIS — E1140 Type 2 diabetes mellitus with diabetic neuropathy, unspecified: Secondary | ICD-10-CM | POA: Diagnosis present

## 2016-07-07 DIAGNOSIS — E1165 Type 2 diabetes mellitus with hyperglycemia: Secondary | ICD-10-CM | POA: Diagnosis present

## 2016-07-07 DIAGNOSIS — E890 Postprocedural hypothyroidism: Secondary | ICD-10-CM | POA: Diagnosis present

## 2016-07-07 DIAGNOSIS — I2510 Atherosclerotic heart disease of native coronary artery without angina pectoris: Secondary | ICD-10-CM | POA: Diagnosis present

## 2016-07-07 DIAGNOSIS — E039 Hypothyroidism, unspecified: Secondary | ICD-10-CM | POA: Diagnosis not present

## 2016-07-07 DIAGNOSIS — J189 Pneumonia, unspecified organism: Secondary | ICD-10-CM | POA: Diagnosis present

## 2016-07-07 DIAGNOSIS — N289 Disorder of kidney and ureter, unspecified: Secondary | ICD-10-CM | POA: Diagnosis present

## 2016-07-07 DIAGNOSIS — J9621 Acute and chronic respiratory failure with hypoxia: Secondary | ICD-10-CM | POA: Diagnosis present

## 2016-07-07 DIAGNOSIS — F329 Major depressive disorder, single episode, unspecified: Secondary | ICD-10-CM | POA: Diagnosis present

## 2016-07-07 DIAGNOSIS — D509 Iron deficiency anemia, unspecified: Secondary | ICD-10-CM | POA: Diagnosis present

## 2016-07-07 LAB — GLUCOSE, CAPILLARY
GLUCOSE-CAPILLARY: 261 mg/dL — AB (ref 65–99)
GLUCOSE-CAPILLARY: 352 mg/dL — AB (ref 65–99)
Glucose-Capillary: 344 mg/dL — ABNORMAL HIGH (ref 65–99)
Glucose-Capillary: 436 mg/dL — ABNORMAL HIGH (ref 65–99)

## 2016-07-07 LAB — COMPREHENSIVE METABOLIC PANEL
ALK PHOS: 88 U/L (ref 38–126)
ALT: 16 U/L (ref 14–54)
AST: 24 U/L (ref 15–41)
Albumin: 3.4 g/dL — ABNORMAL LOW (ref 3.5–5.0)
Anion gap: 10 (ref 5–15)
BILIRUBIN TOTAL: 1.5 mg/dL — AB (ref 0.3–1.2)
BUN: 21 mg/dL — AB (ref 6–20)
CALCIUM: 8.3 mg/dL — AB (ref 8.9–10.3)
CO2: 24 mmol/L (ref 22–32)
CREATININE: 1.15 mg/dL — AB (ref 0.44–1.00)
Chloride: 103 mmol/L (ref 101–111)
GFR calc Af Amer: 53 mL/min — ABNORMAL LOW (ref >60)
GFR, EST NON AFRICAN AMERICAN: 46 mL/min — AB (ref >60)
Glucose, Bld: 223 mg/dL — ABNORMAL HIGH (ref 65–99)
Potassium: 3.7 mmol/L (ref 3.5–5.1)
Sodium: 137 mmol/L (ref 135–145)
TOTAL PROTEIN: 6.5 g/dL (ref 6.5–8.1)

## 2016-07-07 LAB — HEMOGLOBIN A1C
Hgb A1c MFr Bld: 7.6 % — ABNORMAL HIGH (ref 4.8–5.6)
Mean Plasma Glucose: 171 mg/dL

## 2016-07-07 LAB — CBC
HCT: 28.6 % — ABNORMAL LOW (ref 36.0–46.0)
Hemoglobin: 9.1 g/dL — ABNORMAL LOW (ref 12.0–15.0)
MCH: 28.8 pg (ref 26.0–34.0)
MCHC: 31.8 g/dL (ref 30.0–36.0)
MCV: 90.5 fL (ref 78.0–100.0)
PLATELETS: 141 10*3/uL — AB (ref 150–400)
RBC: 3.16 MIL/uL — ABNORMAL LOW (ref 3.87–5.11)
RDW: 14.1 % (ref 11.5–15.5)
WBC: 9.8 10*3/uL (ref 4.0–10.5)

## 2016-07-07 MED ORDER — IPRATROPIUM-ALBUTEROL 0.5-2.5 (3) MG/3ML IN SOLN
3.0000 mL | Freq: Four times a day (QID) | RESPIRATORY_TRACT | Status: DC
  Administered 2016-07-07 – 2016-07-09 (×7): 3 mL via RESPIRATORY_TRACT
  Filled 2016-07-07 (×7): qty 3

## 2016-07-07 MED ORDER — INSULIN NPH (HUMAN) (ISOPHANE) 100 UNIT/ML ~~LOC~~ SUSP
24.0000 [IU] | Freq: Every day | SUBCUTANEOUS | Status: DC
  Administered 2016-07-07: 24 [IU] via SUBCUTANEOUS
  Filled 2016-07-07 (×2): qty 10

## 2016-07-07 MED ORDER — SALINE SPRAY 0.65 % NA SOLN
1.0000 | NASAL | Status: DC | PRN
  Filled 2016-07-07: qty 44

## 2016-07-07 MED ORDER — ALBUTEROL SULFATE (2.5 MG/3ML) 0.083% IN NEBU: 2.5000 mg | INHALATION_SOLUTION | RESPIRATORY_TRACT | Status: DC | PRN

## 2016-07-07 NOTE — Progress Notes (Signed)
Pt refused C-PAP per pt said she doesn't need at this moment  it will let me know when she needs it, will continue to monitor pt

## 2016-07-07 NOTE — Progress Notes (Signed)
Results for NASTASIA, KAGE (MRN [id number]) as of 07/07/2016 08:54  Ref. Range 07/27/2015 07:48 07/27/2015 12:05 07/06/2016 16:18 07/06/2016 21:10 07/07/2016 07:37  Glucose-Capillary Latest Ref Range: 65 - 99 mg/dL 283 (H) 410 (H) 156 (H) 216 (H) 261 (H)  Noted that blood sugars have been greater than 180 mg/dl.  Recommend adding Lantus 15 - 20 units daily if blood sugars continue to be elevated. Continue Novolog SENSITIVE correction scale TID & HS as ordered.  Patient takes NPH insulin 24 units daily and Novolin Regular 3-20 units TID at home. Will continue to monitor blood sugars while in the hospital.  Harvel Ricks RN BSN CDE Diabetes Coordinator Pager: [phone]  8am-5pm

## 2016-07-07 NOTE — Progress Notes (Addendum)
PROGRESS NOTE    Bonnie Henry  VOJ:[id number] DOB: [birth year] DOA: 07/06/2016 PCP: Arnetha Courser, MD    Brief Narrative: Bonnie Henry is a 76 y.o. female with medical history significant for COPD on O2 2 L , OSA, Afib, L breast cancer, CHF, h/o DVT, GERD, Hep C, HTN, HLD  , DM with neuropathy, hypothyroidism, presenting with 3 days history of progressive shortness of breath accompanied by green productive sputum  Assessment & Plan:   Active Problems:   Type 2 diabetes with nephropathy (HCC)   HTN (hypertension)   CAD S/P remote PCI    High cholesterol   PVD- h/o LCEA   Morbid obesity (HCC)   Chronic pain   COPD, moderate (HCC)   Clinical depression   Acid reflux   Adult hypothyroidism   Apnea, sleep   Diabetes mellitus with diabetic neuropathy (HCC)   Acquired exophthalmos   COPD exacerbation (HCC)   Chronic respiratory failure with hypoxia (HCC)   Anemia, iron deficiency   Renal insufficiency   Graves' disease with exophthalmos   1-Acute on chronic  hypoxic respiratory failure; secondary to COPD exacerbation,  -Respiratory panel positive for rhinovirus.  -required 4 L oxygen on admission.  -continue with IV solumedrol, nebulizer.  Levaquin to cover for superimposed PNA>  Still with diffuse wheezing and dyspnea.   2-DM; hyperglycemia, uncontrolled.  Resume NPH.  Continue with SSI.   3-Chronic systolic HF ;  Continue with coreg and Bumex.   4-OSA; CPAP.   5-Depression; continue with Zoloft.   6-GERD; continue with synthroid.   7-Anemia; follow trend.   DVT prophylaxis: heparin  Code Status: full code.  Family Communication: care discussed with patient  Disposition Plan: transfer to med-surgery   Consultants:   none   Procedures: none   Antimicrobials: Levaquin    Subjective: She is feeling better, not at baseline for her breathing. Still with diffuse wheezing.  Productive cough, green sputum.   Objective: Vitals:   07/07/16  0300 07/07/16 0443 07/07/16 0720 07/07/16 0740  BP: (!) 115/44   (!) 108/49  Pulse: 66   65  Resp: 13   16  Temp: 98.5 F (36.9 C)   99 F (37.2 C)  TempSrc: Oral   Oral  SpO2: 97%  99% 97%  Weight:  93.5 kg (206 lb 2.1 oz)    Height:        Intake/Output Summary (Last 24 hours) at 07/07/16 1030 Last data filed at 07/07/16 0900  Gross per 24 hour  Intake              980 ml  Output             1125 ml  Net             -145 ml   Filed Weights   07/06/16 1500 07/07/16 0443  Weight: 93.4 kg (205 lb 14.6 oz) 93.5 kg (206 lb 2.1 oz)    Examination:  General exam: Appears calm and comfortable  Respiratory system: Respiratory effort normal. Bilateral Wheezing.  Cardiovascular system: S1 & S2 heard, RRR. No JVD, murmurs, rubs, gallops or clicks. No pedal edema. Gastrointestinal system: Abdomen is nondistended, soft and mild tender. No organomegaly or masses felt. Normal bowel sounds heard. Central nervous system: Alert and oriented. No focal neurological deficits. Extremities: Symmetric 5 x 5 power. Skin: No rashes, lesions or ulcers Psychiatry: Judgement and insight appear normal. Mood & affect appropriate.     Data Reviewed: I have  personally reviewed following labs and imaging studies  CBC:  Recent Labs Lab 07/06/16 1140 07/07/16 0300  WBC 12.2* 9.8  NEUTROABS 10.0*  --   HGB 11.7* 9.1*  HCT 37.0 28.6*  MCV 91.8 90.5  PLT 158 725*   Basic Metabolic Panel:  Recent Labs Lab 07/06/16 1140 07/07/16 0300  NA 140 137  K 4.1 3.7  CL 102 103  CO2 26 24  GLUCOSE 138* 223*  BUN 15 21*  CREATININE 1.01* 1.15*  CALCIUM 9.0 8.3*   GFR: Estimated Creatinine Clearance: 43.8 mL/min (A) (by C-G formula based on SCr of 1.15 mg/dL (H)). Liver Function Tests:  Recent Labs Lab 07/07/16 0300  AST 24  ALT 16  ALKPHOS 88  BILITOT 1.5*  PROT 6.5  ALBUMIN 3.4*   No results for input(s): LIPASE, AMYLASE in the last 168 hours. No results for input(s): AMMONIA in the  last 168 hours. Coagulation Profile: No results for input(s): INR, PROTIME in the last 168 hours. Cardiac Enzymes: No results for input(s): CKTOTAL, CKMB, CKMBINDEX, TROPONINI in the last 168 hours. BNP (last 3 results) No results for input(s): PROBNP in the last 8760 hours. HbA1C:  Recent Labs  07/06/16 1510  HGBA1C 7.6*   CBG:  Recent Labs Lab 07/06/16 1618 07/06/16 2110 07/07/16 0737  GLUCAP 156* 216* 261*   Lipid Profile: No results for input(s): CHOL, HDL, LDLCALC, TRIG, CHOLHDL, LDLDIRECT in the last 72 hours. Thyroid Function Tests: No results for input(s): TSH, T4TOTAL, FREET4, T3FREE, THYROIDAB in the last 72 hours. Anemia Panel: No results for input(s): VITAMINB12, FOLATE, FERRITIN, TIBC, IRON, RETICCTPCT in the last 72 hours. Sepsis Labs:  Recent Labs Lab 07/06/16 1510 07/06/16 1757  LATICACIDVEN 1.0 0.8    Recent Results (from the past 240 hour(s))  Respiratory Panel by PCR     Status: Abnormal   Collection Time: 07/06/16  3:45 PM  Result Value Ref Range Status   Adenovirus NOT DETECTED NOT DETECTED Final   Coronavirus 229E NOT DETECTED NOT DETECTED Final   Coronavirus HKU1 NOT DETECTED NOT DETECTED Final   Coronavirus NL63 NOT DETECTED NOT DETECTED Final   Coronavirus OC43 NOT DETECTED NOT DETECTED Final   Metapneumovirus NOT DETECTED NOT DETECTED Final   Rhinovirus / Enterovirus DETECTED (A) NOT DETECTED Final   Influenza A NOT DETECTED NOT DETECTED Final   Influenza B NOT DETECTED NOT DETECTED Final   Parainfluenza Virus 1 NOT DETECTED NOT DETECTED Final   Parainfluenza Virus 2 NOT DETECTED NOT DETECTED Final   Parainfluenza Virus 3 NOT DETECTED NOT DETECTED Final   Parainfluenza Virus 4 NOT DETECTED NOT DETECTED Final   Respiratory Syncytial Virus NOT DETECTED NOT DETECTED Final   Bordetella pertussis NOT DETECTED NOT DETECTED Final   Chlamydophila pneumoniae NOT DETECTED NOT DETECTED Final   Mycoplasma pneumoniae NOT DETECTED NOT DETECTED  Final  MRSA PCR Screening     Status: None   Collection Time: 07/06/16  3:45 PM  Result Value Ref Range Status   MRSA by PCR NEGATIVE NEGATIVE Final    Comment:        The GeneXpert MRSA Assay (FDA approved for NASAL specimens only), is one component of a comprehensive MRSA colonization surveillance program. It is not intended to diagnose MRSA infection nor to guide or monitor treatment for MRSA infections.          Radiology Studies: Dg Chest Portable 1 View  Result Date: 07/06/2016 CLINICAL DATA:  Shortness of breath. History of COPD and hypertension. EXAM:  PORTABLE CHEST 1 VIEW COMPARISON:  Chest x-ray and chest CT 01/17/2016 FINDINGS: The heart is upper limits of normal in size given the AP projection and portable technique. The mediastinal and hilar contours appear normal in stable. Moderate atherosclerotic calcifications. Chronic emphysematous and bronchitic lung changes without definite acute overlying pulmonary process. No definite pleural effusion. The bony thorax is intact. IMPRESSION: Chronic lung changes without definite acute overlying pulmonary process. Electronically Signed   By: Marijo Sanes M.D.   On: 07/06/2016 12:03        Scheduled Meds: . aspirin EC  81 mg Oral Daily  . atorvastatin  80 mg Oral QHS  . bumetanide  0.5 mg Oral Daily  . busPIRone  15 mg Oral BID  . carvedilol  12.5 mg Oral BID WC  . gabapentin  300 mg Oral BID  . heparin  5,000 Units Subcutaneous Q8H  . insulin aspart  0-9 Units Subcutaneous TID WC  . insulin NPH Human  24 Units Subcutaneous QHS  . ipratropium-albuterol  3 mL Nebulization Q4H  . levothyroxine  100 mcg Oral Once per day on Mon Tue Wed Thu  . [START ON 07/11/2016] levothyroxine  112 mcg Oral Once per day on Sun Fri Sat  . methylPREDNISolone (SOLU-MEDROL) injection  60 mg Intravenous Q8H  . montelukast  10 mg Oral Daily  . pantoprazole  20 mg Oral BID  . rOPINIRole  1 mg Oral TID  . sertraline  150 mg Oral Daily  .  spironolactone  25 mg Oral Daily   Continuous Infusions: . levofloxacin (LEVAQUIN) IV Stopped (07/06/16 1924)     LOS: 0 days    Time spent: 35 minutes.     Elmarie Shiley, MD [facility] Pager [phone]  If 7PM-7AM, please contact night-coverage [email or web address] Password [email or web address] 07/07/2016, 10:30 AM

## 2016-07-07 NOTE — Progress Notes (Signed)
Report called pt transferring to [id number] once done eating lunch.

## 2016-07-08 LAB — CBC
HCT: 30.4 % — ABNORMAL LOW (ref 36.0–46.0)
Hemoglobin: 9.5 g/dL — ABNORMAL LOW (ref 12.0–15.0)
MCH: 28.5 pg (ref 26.0–34.0)
MCHC: 31.3 g/dL (ref 30.0–36.0)
MCV: 91.3 fL (ref 78.0–100.0)
PLATELETS: 145 10*3/uL — AB (ref 150–400)
RBC: 3.33 MIL/uL — AB (ref 3.87–5.11)
RDW: 14.4 % (ref 11.5–15.5)
WBC: 9.7 10*3/uL (ref 4.0–10.5)

## 2016-07-08 LAB — BASIC METABOLIC PANEL
Anion gap: 8 (ref 5–15)
BUN: 30 mg/dL — AB (ref 6–20)
CHLORIDE: 104 mmol/L (ref 101–111)
CO2: 27 mmol/L (ref 22–32)
CREATININE: 1.16 mg/dL — AB (ref 0.44–1.00)
Calcium: 8.5 mg/dL — ABNORMAL LOW (ref 8.9–10.3)
GFR calc Af Amer: 52 mL/min — ABNORMAL LOW (ref >60)
GFR, EST NON AFRICAN AMERICAN: 45 mL/min — AB (ref >60)
GLUCOSE: 371 mg/dL — AB (ref 65–99)
POTASSIUM: 4.5 mmol/L (ref 3.5–5.1)
SODIUM: 139 mmol/L (ref 135–145)

## 2016-07-08 LAB — GLUCOSE, CAPILLARY
GLUCOSE-CAPILLARY: 298 mg/dL — AB (ref 65–99)
Glucose-Capillary: 334 mg/dL — ABNORMAL HIGH (ref 65–99)
Glucose-Capillary: 388 mg/dL — ABNORMAL HIGH (ref 65–99)
Glucose-Capillary: 382 mg/dL — ABNORMAL HIGH (ref 65–99)

## 2016-07-08 MED ORDER — LEVOFLOXACIN IN D5W 500 MG/100ML IV SOLN
500.0000 mg | INTRAVENOUS | Status: DC
  Administered 2016-07-09: 500 mg via INTRAVENOUS
  Filled 2016-07-08: qty 100

## 2016-07-08 MED ORDER — GABAPENTIN 300 MG PO CAPS
600.0000 mg | ORAL_CAPSULE | Freq: Two times a day (BID) | ORAL | Status: DC
  Administered 2016-07-08 – 2016-07-10 (×4): 600 mg via ORAL
  Filled 2016-07-08 (×4): qty 2

## 2016-07-08 MED ORDER — ARFORMOTEROL TARTRATE 15 MCG/2ML IN NEBU
15.0000 ug | INHALATION_SOLUTION | Freq: Two times a day (BID) | RESPIRATORY_TRACT | Status: DC
  Administered 2016-07-08 – 2016-07-10 (×5): 15 ug via RESPIRATORY_TRACT
  Filled 2016-07-08 (×5): qty 2

## 2016-07-08 MED ORDER — BUDESONIDE 0.5 MG/2ML IN SUSP
0.5000 mg | Freq: Two times a day (BID) | RESPIRATORY_TRACT | Status: DC
  Administered 2016-07-08 – 2016-07-10 (×5): 0.5 mg via RESPIRATORY_TRACT
  Filled 2016-07-08 (×5): qty 2

## 2016-07-08 MED ORDER — GABAPENTIN 300 MG PO CAPS
300.0000 mg | ORAL_CAPSULE | ORAL | Status: AC
  Administered 2016-07-08: 300 mg via ORAL
  Filled 2016-07-08: qty 1

## 2016-07-08 MED ORDER — INSULIN NPH (HUMAN) (ISOPHANE) 100 UNIT/ML ~~LOC~~ SUSP
15.0000 [IU] | Freq: Two times a day (BID) | SUBCUTANEOUS | Status: DC
  Administered 2016-07-08 – 2016-07-10 (×5): 15 [IU] via SUBCUTANEOUS
  Filled 2016-07-08 (×2): qty 10

## 2016-07-08 MED ORDER — INSULIN ASPART 100 UNIT/ML ~~LOC~~ SOLN
3.0000 [IU] | Freq: Three times a day (TID) | SUBCUTANEOUS | Status: DC
  Administered 2016-07-08 – 2016-07-10 (×7): 3 [IU] via SUBCUTANEOUS

## 2016-07-08 NOTE — Progress Notes (Signed)
Inpatient Diabetes Program Recommendations  AACE/ADA: New Consensus Statement on Inpatient Glycemic Control (2015)  Target Ranges:  Prepandial:   less than 140 mg/dL      Peak postprandial:   less than 180 mg/dL (1-2 hours)      Critically ill patients:  140 - 180 mg/dL   Lab Results  Component Value Date   GLUCAP 334 (H) 07/08/2016   HGBA1C 7.6 (H) 07/06/2016    Review of Glycemic Control  Diabetes history: DM 2 Outpatient Diabetes medications: NPH 24 units QHS, Novolin Regular 3-20 units tid Current orders for Inpatient glycemic control: NPH 24 units QHS, Novolog Sensitive tid   Inpatient Diabetes Program Recommendations:    Patient receiving IV Solumedrol 60 mg Q8 hours. Glucose in the 300-400 range. Consider ordering NPH 15 units BID. Consider increasing correction scale to Novolog Resistant and add Novolog HS scale.  Thanks,  Tama Headings RN, MSN, [facility] Inpatient Diabetes Coordinator Team Pager [phone] (8a-5p)

## 2016-07-08 NOTE — Progress Notes (Signed)
PROGRESS NOTE    Bonnie Henry  HFW:[id number] DOB: 11-Mar-1940 DOA: 07/06/2016 PCP: Arnetha Courser, MD    Brief Narrative: Bonnie Henry is a 76 y.o. female with medical history significant for COPD on O2 2 L , OSA, Afib, L breast cancer, CHF, h/o DVT, GERD, Hep C, HTN, HLD  , DM with neuropathy, hypothyroidism, presenting with 3 days history of progressive shortness of breath accompanied by green productive sputum  Assessment & Plan:   Active Problems:   Type 2 diabetes with nephropathy (HCC)   HTN (hypertension)   CAD S/P remote PCI    High cholesterol   PVD- h/o LCEA   Morbid obesity (HCC)   Chronic pain   COPD, moderate (HCC)   Clinical depression   Acid reflux   Adult hypothyroidism   Apnea, sleep   Diabetes mellitus with diabetic neuropathy (HCC)   Acquired exophthalmos   COPD exacerbation (HCC)   Chronic respiratory failure with hypoxia (HCC)   Anemia, iron deficiency   Renal insufficiency   Graves' disease with exophthalmos   1-Acute on chronic  hypoxic respiratory failure; secondary to COPD exacerbation,  -Respiratory panel positive for rhinovirus.  -required 4 L oxygen on admission.  -continue with IV solumedrol, nebulizer.  Levaquin to cover for superimposed PNA>  Not at baseline, continue with current care.   2-DM; hyperglycemia, uncontrolled.  Resume NPH. Change to BID>  Continue with SSI. Add meals coverage.   3-Chronic systolic HF ;  Continue with coreg and Bumex.   4-OSA; CPAP.   5-Depression; continue with Zoloft.   6-GERD; continue with synthroid.   7-Anemia; follow trend.   DVT prophylaxis: heparin  Code Status: full code.  Family Communication: care discussed with patient  Disposition Plan: not ready to be discharge.   Consultants:   none   Procedures: none   Antimicrobials: Levaquin    Subjective: She is not feeling better or ready to be discharge. Report dyspnea, persistent productive cough.     Objective: Vitals:   07/08/16 0431 07/08/16 0821 07/08/16 0842 07/08/16 1222  BP: (!) 161/62 (!) 144/52    Pulse: 76 71    Resp: 19 18    Temp: 97.7 F (36.5 C) 98.1 F (36.7 C)    TempSrc: Oral Oral    SpO2: 99% 100% 100% 96%  Weight:      Height:        Intake/Output Summary (Last 24 hours) at 07/08/16 1249 Last data filed at 07/08/16 1100  Gross per 24 hour  Intake              880 ml  Output             1950 ml  Net            -1070 ml   Filed Weights   07/06/16 1500 07/07/16 0443 07/08/16 0424  Weight: 93.4 kg (205 lb 14.6 oz) 93.5 kg (206 lb 2.1 oz) 90.8 kg (200 lb 1.6 oz)    Examination:  General exam: No acute distress, sitting in  couch.  Respiratory system: Respiratory effort normal, Bilateral diffuse expiratory wheezing.  Cardiovascular system: S 1, S 2 RRR Gastrointestinal system: Abdomen soft, NT, ND, B presents.  Central nervous system: Non focal.  Extremities: Symmetric power.  Skin; no rash, no ulcer.      Data Reviewed: I have personally reviewed following labs and imaging studies  CBC:  Recent Labs Lab 07/06/16 1140 07/07/16 0300 07/08/16 0402  WBC 12.2*  9.8 9.7  NEUTROABS 10.0*  --   --   HGB 11.7* 9.1* 9.5*  HCT 37.0 28.6* 30.4*  MCV 91.8 90.5 91.3  PLT 158 141* 924*   Basic Metabolic Panel:  Recent Labs Lab 07/06/16 1140 07/07/16 0300 07/08/16 0402  NA 140 137 139  K 4.1 3.7 4.5  CL 102 103 104  CO2 26 24 27   GLUCOSE 138* 223* 371*  BUN 15 21* 30*  CREATININE 1.01* 1.15* 1.16*  CALCIUM 9.0 8.3* 8.5*   GFR: Estimated Creatinine Clearance: 42.7 mL/min (A) (by C-G formula based on SCr of 1.16 mg/dL (H)). Liver Function Tests:  Recent Labs Lab 07/07/16 0300  AST 24  ALT 16  ALKPHOS 88  BILITOT 1.5*  PROT 6.5  ALBUMIN 3.4*   No results for input(s): LIPASE, AMYLASE in the last 168 hours. No results for input(s): AMMONIA in the last 168 hours. Coagulation Profile: No results for input(s): INR, PROTIME in the  last 168 hours. Cardiac Enzymes: No results for input(s): CKTOTAL, CKMB, CKMBINDEX, TROPONINI in the last 168 hours. BNP (last 3 results) No results for input(s): PROBNP in the last 8760 hours. HbA1C:  Recent Labs  07/06/16 1510  HGBA1C 7.6*   CBG:  Recent Labs Lab 07/07/16 1132 07/07/16 1656 07/07/16 2125 07/08/16 0746 07/08/16 1207  GLUCAP 344* 352* 436* 334* 388*   Lipid Profile: No results for input(s): CHOL, HDL, LDLCALC, TRIG, CHOLHDL, LDLDIRECT in the last 72 hours. Thyroid Function Tests: No results for input(s): TSH, T4TOTAL, FREET4, T3FREE, THYROIDAB in the last 72 hours. Anemia Panel: No results for input(s): VITAMINB12, FOLATE, FERRITIN, TIBC, IRON, RETICCTPCT in the last 72 hours. Sepsis Labs:  Recent Labs Lab 07/06/16 1510 07/06/16 1757  LATICACIDVEN 1.0 0.8    Recent Results (from the past 240 hour(s))  Respiratory Panel by PCR     Status: Abnormal   Collection Time: 07/06/16  3:45 PM  Result Value Ref Range Status   Adenovirus NOT DETECTED NOT DETECTED Final   Coronavirus 229E NOT DETECTED NOT DETECTED Final   Coronavirus HKU1 NOT DETECTED NOT DETECTED Final   Coronavirus NL63 NOT DETECTED NOT DETECTED Final   Coronavirus OC43 NOT DETECTED NOT DETECTED Final   Metapneumovirus NOT DETECTED NOT DETECTED Final   Rhinovirus / Enterovirus DETECTED (A) NOT DETECTED Final   Influenza A NOT DETECTED NOT DETECTED Final   Influenza B NOT DETECTED NOT DETECTED Final   Parainfluenza Virus 1 NOT DETECTED NOT DETECTED Final   Parainfluenza Virus 2 NOT DETECTED NOT DETECTED Final   Parainfluenza Virus 3 NOT DETECTED NOT DETECTED Final   Parainfluenza Virus 4 NOT DETECTED NOT DETECTED Final   Respiratory Syncytial Virus NOT DETECTED NOT DETECTED Final   Bordetella pertussis NOT DETECTED NOT DETECTED Final   Chlamydophila pneumoniae NOT DETECTED NOT DETECTED Final   Mycoplasma pneumoniae NOT DETECTED NOT DETECTED Final  MRSA PCR Screening     Status: None    Collection Time: 07/06/16  3:45 PM  Result Value Ref Range Status   MRSA by PCR NEGATIVE NEGATIVE Final    Comment:        The GeneXpert MRSA Assay (FDA approved for NASAL specimens only), is one component of a comprehensive MRSA colonization surveillance program. It is not intended to diagnose MRSA infection nor to guide or monitor treatment for MRSA infections.          Radiology Studies: No results found.      Scheduled Meds: . arformoterol  15 mcg Nebulization Q12H  .  aspirin EC  81 mg Oral Daily  . atorvastatin  80 mg Oral QHS  . budesonide  0.5 mg Nebulization BID  . bumetanide  0.5 mg Oral Daily  . busPIRone  15 mg Oral BID  . carvedilol  12.5 mg Oral BID WC  . gabapentin  300 mg Oral NOW  . gabapentin  600 mg Oral BID  . heparin  5,000 Units Subcutaneous Q8H  . insulin aspart  0-9 Units Subcutaneous TID WC  . insulin aspart  3 Units Subcutaneous TID WC  . insulin NPH Human  24 Units Subcutaneous QHS  . ipratropium-albuterol  3 mL Nebulization QID  . levothyroxine  100 mcg Oral Once per day on Mon Tue Wed Thu  . [START ON 07/11/2016] levothyroxine  112 mcg Oral Once per day on Sun Fri Sat  . methylPREDNISolone (SOLU-MEDROL) injection  60 mg Intravenous Q8H  . montelukast  10 mg Oral Daily  . pantoprazole  20 mg Oral BID  . rOPINIRole  1 mg Oral TID  . sertraline  150 mg Oral Daily  . spironolactone  25 mg Oral Daily   Continuous Infusions: . levofloxacin (LEVAQUIN) IV Stopped (07/07/16 1816)     LOS: 1 day    Time spent: 35 minutes.     Elmarie Shiley, MD [facility] Pager [phone]  If 7PM-7AM, please contact night-coverage [email or web address] Password [email or web address] 07/08/2016, 12:49 PM

## 2016-07-09 DIAGNOSIS — K219 Gastro-esophageal reflux disease without esophagitis: Secondary | ICD-10-CM

## 2016-07-09 DIAGNOSIS — E039 Hypothyroidism, unspecified: Secondary | ICD-10-CM

## 2016-07-09 LAB — GLUCOSE, CAPILLARY
GLUCOSE-CAPILLARY: 273 mg/dL — AB (ref 65–99)
GLUCOSE-CAPILLARY: 421 mg/dL — AB (ref 65–99)
GLUCOSE-CAPILLARY: 263 mg/dL — AB (ref 65–99)
Glucose-Capillary: 447 mg/dL — ABNORMAL HIGH (ref 65–99)

## 2016-07-09 MED ORDER — IPRATROPIUM-ALBUTEROL 0.5-2.5 (3) MG/3ML IN SOLN
3.0000 mL | Freq: Three times a day (TID) | RESPIRATORY_TRACT | Status: DC
  Administered 2016-07-09 – 2016-07-10 (×3): 3 mL via RESPIRATORY_TRACT
  Filled 2016-07-09 (×3): qty 3

## 2016-07-09 NOTE — Progress Notes (Signed)
Pt. not currently using CPAP since weight loss, aware to notify if needed, RT to monitor

## 2016-07-09 NOTE — Progress Notes (Signed)
ANTIBIOTIC CONSULT NOTE   Pharmacy Consult for Levaquin Indication: COPD exac / PNA  Allergies  Allergen Reactions  . Vasotec [Enalapril] Swelling    Throat swells  . Codeine Nausea And Vomiting  . Morphine And Related Nausea And Vomiting    Patient Measurements: Height: 5' (152.4 cm) Weight: 195 lb 6.4 oz (88.6 kg) IBW/kg (Calculated) : 45.5 Adjusted Body Weight:    Vital Signs: Temp: 97.6 F (36.4 C) (07/04 0702) Temp Source: Oral (07/04 0702) BP: 179/68 (07/04 0702) Pulse Rate: 69 (07/04 0933) Intake/Output from previous day: 07/03 0701 - 07/04 0700 In: 480 [P.O.:480] Out: 3050 [Urine:3050] Intake/Output from this shift: No intake/output data recorded.  Labs:  Recent Labs  07/06/16 1140 07/07/16 0300 07/08/16 0402  WBC 12.2* 9.8 9.7  HGB 11.7* 9.1* 9.5*  PLT 158 141* 145*  CREATININE 1.01* 1.15* 1.16*   Estimated Creatinine Clearance: 42.1 mL/min (A) (by C-G formula based on SCr of 1.16 mg/dL (H)). No results for input(s): VANCOTROUGH, VANCOPEAK, VANCORANDOM, GENTTROUGH, GENTPEAK, GENTRANDOM, TOBRATROUGH, TOBRAPEAK, TOBRARND, AMIKACINPEAK, AMIKACINTROU, AMIKACIN in the last 72 hours.   Microbiology:   Medical History: Past Medical History:  Diagnosis Date  . Anemia   . Anxiety   . Arthritis    "joints ache all over" . osteoarthritis  . Asthma   . Atrial fibrillation ([place])   . Breast cancer, left breast (HCC)    S/P mastectomy  . CAD (coronary artery disease)   . CHF (congestive heart failure) ([place])   . Chronic back pain   . Constipation 03/04/2015  . COPD (chronic obstructive pulmonary disease) ([place])   . Depression, major   . Diabetes mellitus without complication ([place])   . DVT (deep venous thrombosis) ([place])   . GERD (gastroesophageal reflux disease)   . Headache   . Heart murmur   . Hepatitis    HEP "C"  . History of blood transfusion    "when I had my mastectomy"  . History of hiatal hernia   . History of right-sided carotid  endarterectomy 06/10/2015  . Hypercholesteremia   . Hypertension   . Morbid obesity ([place])   . Myocardial infarction ([place])   . Neuropathy   . Peripheral vascular disease ([place])   . Post-surgical hypothyroidism   . Restless leg syndrome   . Shortness of breath dyspnea   . Sleep apnea    "2015 wore mask; lost weight; told didn't need mask anymore" (02/23/2014)  . Type II diabetes mellitus (HCC)     Assessment: ID: abx for COPD excac/PNA - WBC 9.7 unchanged, Afebrile, renal stable. Down to 2L [place]  Levaquin 7/1>>  7/1 rvp - rhinovirus  Goal of Therapy:  Eradication of infection  Plan:  Levaquin 500mg  IV adjusted to every 48 hrs for CrCl<50  Franchon Ketterman S. Alford Highland, PharmD, BCPS Clinical Staff Pharmacist Pager [phone]  Eilene Ghazi Stillinger 07/09/2016,9:54 AM

## 2016-07-09 NOTE — Progress Notes (Signed)
PROGRESS NOTE    Bonnie Henry Henry  QQV:[id number] DOB: 1940-02-09 DOA: 07/06/2016 PCP: Arnetha Courser, MD    Brief Narrative: Bonnie Henry Henry is a 76 y.o. female with medical history significant for COPD on O2 2 L , OSA, Afib, L breast cancer, CHF, h/o DVT, GERD, Hep C, HTN, HLD  , DM with neuropathy, hypothyroidism, presenting with 3 days history of progressive shortness of breath accompanied by green productive sputum  Assessment & Plan:   Active Problems:   Type 2 diabetes with nephropathy (HCC)   HTN (hypertension)   CAD S/P remote PCI    High cholesterol   PVD- h/o LCEA   Morbid obesity (HCC)   Chronic pain   COPD, moderate (HCC)   Clinical depression   Acid reflux   Adult hypothyroidism   Apnea, sleep   Diabetes mellitus with diabetic neuropathy (HCC)   Acquired exophthalmos   COPD exacerbation (HCC)   Chronic respiratory failure with hypoxia (HCC)   Anemia, iron deficiency   Renal insufficiency   Graves' disease with exophthalmos   1-Acute on chronic  hypoxic respiratory failure; secondary to COPD exacerbation,  - Respiratory panel positive for rhinovirus.  - required 4 L oxygen on admission.  - continue with IV solumedrol, nebulizer.  - continue Levaquin for suspected superimposed PNA - Still not at baseline, continue with current care.   2-DM; hyperglycemia, uncontrolled.  - Resume Current regimen. - Continue with SSI.   3-Chronic systolic HF ;  - Stable on Continue with coreg and Bumex.   4-OSA; CPAP.   5-Depression;  - stable continue with Zoloft.   6-GERD - continue protonix  7-Anemia; follow trend.   DVT prophylaxis: heparin  Code Status: full code.  Family Communication: care discussed with patient  Disposition Plan: not ready to be discharge.   Consultants:   none   Procedures: none   Antimicrobials: Levaquin    Subjective: Patient reports that she feels better but is not quite at baseline. She is interested in going  home soon but reports wheezing still.   Objective: Vitals:   07/09/16 0702 07/09/16 0933 07/09/16 1043 07/09/16 1153  BP: (!) 179/68  (!) 180/64 (!) 180/64  Pulse: 70 69 66 68  Resp: 18 18  18   Temp: 97.6 F (36.4 C)     TempSrc: Oral     SpO2: 98% 97%  97%  Weight:      Height:        Intake/Output Summary (Last 24 hours) at 07/09/16 1826 Last data filed at 07/09/16 1315  Gross per 24 hour  Intake                0 ml  Output             1800 ml  Net            -1800 ml   Filed Weights   07/07/16 0443 07/08/16 0424 07/09/16 0657  Weight: 93.5 kg (206 lb 2.1 oz) 90.8 kg (200 lb 1.6 oz) 88.6 kg (195 lb 6.4 oz)    Examination:  General exam: No acute distress, Awake and alert sitting up with nasal cannula not place Respiratory system: Respiratory effort normal,Patient has expiratory wheezes diffusely bilaterally, equal chest rise Cardiovascular system: S 1, S 2 RRR, no murmurs Gastrointestinal system: Abdomen soft, NT, ND, B presents.  Central nervous system: Non focal. No facial asymmetry Extremities: Symmetric power.  Skin; no rash, no ulcer. On limited exam   Data  Reviewed: I have personally reviewed following labs and imaging studies  CBC:  Recent Labs Lab 07/06/16 1140 07/07/16 0300 07/08/16 0402  WBC 12.2* 9.8 9.7  NEUTROABS 10.0*  --   --   HGB 11.7* 9.1* 9.5*  HCT 37.0 28.6* 30.4*  MCV 91.8 90.5 91.3  PLT 158 141* 932*   Basic Metabolic Panel:  Recent Labs Lab 07/06/16 1140 07/07/16 0300 07/08/16 0402  NA 140 137 139  K 4.1 3.7 4.5  CL 102 103 104  CO2 26 24 27   GLUCOSE 138* 223* 371*  BUN 15 21* 30*  CREATININE 1.01* 1.15* 1.16*  CALCIUM 9.0 8.3* 8.5*   GFR: Estimated Creatinine Clearance: 42.1 mL/min (A) (by C-G formula based on SCr of 1.16 mg/dL (H)). Liver Function Tests:  Recent Labs Lab 07/07/16 0300  AST 24  ALT 16  ALKPHOS 88  BILITOT 1.5*  PROT 6.5  ALBUMIN 3.4*   No results for input(s): LIPASE, AMYLASE in the last  168 hours. No results for input(s): AMMONIA in the last 168 hours. Coagulation Profile: No results for input(s): INR, PROTIME in the last 168 hours. Cardiac Enzymes: No results for input(s): CKTOTAL, CKMB, CKMBINDEX, TROPONINI in the last 168 hours. BNP (last 3 results) No results for input(s): PROBNP in the last 8760 hours. HbA1C: No results for input(s): HGBA1C in the last 72 hours. CBG:  Recent Labs Lab 07/08/16 1726 07/08/16 2222 07/09/16 0817 07/09/16 1203 07/09/16 1723  GLUCAP 382* 298* 273* 447* 421*   Lipid Profile: No results for input(s): CHOL, HDL, LDLCALC, TRIG, CHOLHDL, LDLDIRECT in the last 72 hours. Thyroid Function Tests: No results for input(s): TSH, T4TOTAL, FREET4, T3FREE, THYROIDAB in the last 72 hours. Anemia Panel: No results for input(s): VITAMINB12, FOLATE, FERRITIN, TIBC, IRON, RETICCTPCT in the last 72 hours. Sepsis Labs:  Recent Labs Lab 07/06/16 1510 07/06/16 1757  LATICACIDVEN 1.0 0.8    Recent Results (from the past 240 hour(s))  Respiratory Panel by PCR     Status: Abnormal   Collection Time: 07/06/16  3:45 PM  Result Value Ref Range Status   Adenovirus NOT DETECTED NOT DETECTED Final   Coronavirus 229E NOT DETECTED NOT DETECTED Final   Coronavirus HKU1 NOT DETECTED NOT DETECTED Final   Coronavirus NL63 NOT DETECTED NOT DETECTED Final   Coronavirus OC43 NOT DETECTED NOT DETECTED Final   Metapneumovirus NOT DETECTED NOT DETECTED Final   Rhinovirus / Enterovirus DETECTED (A) NOT DETECTED Final   Influenza A NOT DETECTED NOT DETECTED Final   Influenza B NOT DETECTED NOT DETECTED Final   Parainfluenza Virus 1 NOT DETECTED NOT DETECTED Final   Parainfluenza Virus 2 NOT DETECTED NOT DETECTED Final   Parainfluenza Virus 3 NOT DETECTED NOT DETECTED Final   Parainfluenza Virus 4 NOT DETECTED NOT DETECTED Final   Respiratory Syncytial Virus NOT DETECTED NOT DETECTED Final   Bordetella pertussis NOT DETECTED NOT DETECTED Final   Chlamydophila  pneumoniae NOT DETECTED NOT DETECTED Final   Mycoplasma pneumoniae NOT DETECTED NOT DETECTED Final  MRSA PCR Screening     Status: None   Collection Time: 07/06/16  3:45 PM  Result Value Ref Range Status   MRSA by PCR NEGATIVE NEGATIVE Final    Comment:        The GeneXpert MRSA Assay (FDA approved for NASAL specimens only), is one component of a comprehensive MRSA colonization surveillance program. It is not intended to diagnose MRSA infection nor to guide or monitor treatment for MRSA infections.  Radiology Studies: No results found.  Scheduled Meds: . arformoterol  15 mcg Nebulization Q12H  . aspirin EC  81 mg Oral Henry  . atorvastatin  80 mg Oral QHS  . budesonide  0.5 mg Nebulization BID  . bumetanide  0.5 mg Oral Henry  . busPIRone  15 mg Oral BID  . carvedilol  12.5 mg Oral BID WC  . gabapentin  600 mg Oral BID  . heparin  5,000 Units Subcutaneous Q8H  . insulin aspart  0-9 Units Subcutaneous TID WC  . insulin aspart  3 Units Subcutaneous TID WC  . insulin NPH Human  15 Units Subcutaneous BID AC & HS  . ipratropium-albuterol  3 mL Nebulization TID  . levothyroxine  100 mcg Oral Once per day on Mon Tue Wed Thu  . [START ON 07/11/2016] levothyroxine  112 mcg Oral Once per day on Sun Fri Sat  . methylPREDNISolone (SOLU-MEDROL) injection  60 mg Intravenous Q8H  . montelukast  10 mg Oral Henry  . pantoprazole  20 mg Oral BID  . rOPINIRole  1 mg Oral TID  . sertraline  150 mg Oral Henry  . spironolactone  25 mg Oral Henry   Continuous Infusions: . levofloxacin (LEVAQUIN) IV       LOS: 2 days    Time spent: 35 minutes.   Velvet Bathe, MD [facility] Pager [phone]  If 7PM-7AM, please contact night-coverage [email or web address] Password [email or web address] 07/09/2016, 6:26 PM

## 2016-07-10 LAB — GLUCOSE, CAPILLARY
Glucose-Capillary: 278 mg/dL — ABNORMAL HIGH (ref 65–99)
Glucose-Capillary: 381 mg/dL — ABNORMAL HIGH (ref 65–99)

## 2016-07-10 MED ORDER — PREDNISONE 10 MG (21) PO TBPK: ORAL_TABLET | ORAL | 0 refills | Status: DC

## 2016-07-10 MED ORDER — LEVOFLOXACIN 500 MG PO TABS: 500.0000 mg | ORAL_TABLET | ORAL | 0 refills | Status: AC

## 2016-07-10 NOTE — Care Management Note (Addendum)
Case Management Note  Patient Details  Name: Bonnie Henry MRN: [id number] Date of Birth: 1940-09-10  Subjective/Objective:     Admitted with COPD exacerbation, hx of COPD on O2 2 L , OSA, Afib, L breast cancer, CHF, h/o DVT, GERD, Hep C, HTN, HLD, DM with neuropathy, hypothyroidism. Resides with daughter. Independent with ADL's PTA. DME: walker, oxygen, nebulizer. Pt active with [facility] for RN,PT services       [name] ([phone]                  PCP: Enid Derry  Action/Plan: Return to home when medically stable. CM to f//u with disposition needs.  Expected Discharge Date:  07/09/16               Expected Discharge Plan:  Home/Self Care  In-House Referral:     Discharge planning Services  CM Consult  Post Acute Care Choice:    Choice offered to:     DME Arranged:    DME Agency:     HH Arranged:   RN,PT. PTA pt already receiving home health services. [place] Agency:   [place]  Status of Service:  Completed, signed off  If discussed at [place] of Stay Meetings, dates discussed:    Additional Comments:  Sharin Mons, RN 07/10/2016, 12:26 PM

## 2016-07-10 NOTE — Progress Notes (Addendum)
Inpatient Diabetes Program Recommendations  AACE/ADA: New Consensus Statement on Inpatient Glycemic Control (2015)  Target Ranges:  Prepandial:   less than 140 mg/dL      Peak postprandial:   less than 180 mg/dL (1-2 hours)      Critically ill patients:  140 - 180 mg/dL   Lab Results  Component Value Date   GLUCAP 381 (H) 07/10/2016   HGBA1C 7.6 (H) 07/06/2016    Review of Glycemic Control  Diabetes history: DM 2 Outpatient Diabetes medications: NPH 24 units QHS, Novolin Regular 3-20 units tid Current orders for Inpatient glycemic control: NPH 24 units QHS, Novolog Sensitive tid, Novolog 3 units tid meal coverage  Inpatient Diabetes Program Recommendations:    Patient receiving IV Solumedrol 60 mg Q8 hours. Glucose in the 200-300 range. NPH increased yesterday. Glucose still elevated.  Consider increasing NPH to 22 units BID.  Consider increasing meal coverage to Novolog 5 units tid Consider adding Novolog HS scale.  Thanks,  Tama Headings RN, MSN, [facility] Inpatient Diabetes Coordinator Team Pager [phone] (8a-5p)

## 2016-07-10 NOTE — Progress Notes (Signed)
Discharge instructions (including medications) discussed with and copy provided to patient/caregiver 

## 2016-07-10 NOTE — Discharge Summary (Signed)
Physician Discharge Summary  Bonnie Henry OZH:[id number] DOB: 05-Oct-1940 DOA: 07/06/2016  PCP: Arnetha Courser, MD  Admit date: 07/06/2016 Discharge date: 07/10/2016  Time spent: > 35 minutes  Recommendations for Outpatient Follow-up:  1. Monitor blood sugars and adjust hypoglycemic regimen accordingly 2. We'll discharge patient on antibiotics and prednisone taper   Discharge Diagnoses:  Active Problems:   Type 2 diabetes with nephropathy (HCC)   HTN (hypertension)   CAD S/P remote PCI    High cholesterol   PVD- h/o LCEA   Morbid obesity (HCC)   Chronic pain   COPD, moderate (HCC)   Clinical depression   Acid reflux   Adult hypothyroidism   Apnea, sleep   Diabetes mellitus with diabetic neuropathy (HCC)   Acquired exophthalmos   COPD exacerbation (HCC)   Chronic respiratory failure with hypoxia (HCC)   Anemia, iron deficiency   Renal insufficiency   Graves' disease with exophthalmos   Discharge Condition: Stable  Diet recommendation: Diabetic diet  Filed Weights   07/08/16 0424 07/09/16 0657 07/10/16 0011  Weight: 90.8 kg (200 lb 1.6 oz) 88.6 kg (195 lb 6.4 oz) 90.9 kg (200 lb 4.8 oz)    History of present illness:  76 y.o.femalewith medical history significant for COPD on O2 2 L , OSA, Afib, L breast cancer, CHF, h/o DVT, GERD, Hep C, HTN, HLD , DM with neuropathy, hypothyroidism, presenting with 3 days history of progressive shortness of breath accompanied by greenproductive sputum   Hospital Course:  -Acute on chronic  hypoxic respiratory failure; secondary to COPD exacerbation,  - Respiratory panel positive for rhinovirus.  - required 4 L oxygen on admission.  - continue with IV solumedrol, nebulizer.  - continue Levaquin for suspected superimposed PNA, will continue for 2 more doses to complete a seven-day treatment course - Still not at baseline, continue with current care.   2-DM; hyperglycemia, uncontrolled.  - Patient had episodes of  hyperglycemia but steroid dosing will be decreased on discharge as such I suspect continued improvement in blood sugar levels  3-Chronic systolic HF ;  - Stable continue prior to admission medication regimen  4-OSA; CPAP as per home settings.   5-Depression;  - stable continue with Zoloft.   6-GERD - continue protonix  7-Anemia; stable and improved from last check   Procedures:  None  Consultations:  None  Discharge Exam: Vitals:   07/10/16 0911 07/10/16 1302  BP:    Pulse: 67 (!) 56  Resp: 18 16  Temp:      General: Pt in nad, alert and awake Cardiovascular: RRR, no rubs Respiratory: no increase  Wob, no wheezes  Discharge Instructions   Discharge Instructions    Call MD for:  difficulty breathing, headache or visual disturbances    Complete by:  As directed    Call MD for:  temperature >100.4    Complete by:  As directed    Diet - low sodium heart healthy    Complete by:  As directed    Discharge instructions    Complete by:  As directed    Please follow-up with your pulmonologist within the next one or 2 weeks for further evaluation recommendations.   Increase activity slowly    Complete by:  As directed      Current Discharge Medication List    START taking these medications   Details  levofloxacin (LEVAQUIN) 500 MG tablet Take 1 tablet (500 mg total) by mouth every other day. Qty: 1 tablet, Refills: 0  predniSONE (STERAPRED UNI-PAK 21 TAB) 10 MG (21) TBPK tablet 50 mg po daily for the next 2 days, then 40 mg po for the next 2 days, then 30 mg po for the next 2 days, then 20 mg po for the next 2 days, then 10 mg daily for the next 2 days. Qty: 35 tablet, Refills: 0      CONTINUE these medications which have NOT CHANGED   Details  acetaminophen (TYLENOL) 500 MG tablet Take 2 tablets (1,000 mg total) by mouth every 6 (six) hours as needed for moderate pain. Qty: 30 tablet, Refills: 0    albuterol (ACCUNEB) 0.63 MG/3ML nebulizer solution  Take 1 ampule by nebulization every 6 (six) hours as needed for wheezing or shortness of breath.     albuterol (PROAIR HFA) 108 (90 BASE) MCG/ACT inhaler Inhale 2 puffs into the lungs every 4 (four) hours as needed for wheezing or shortness of breath.     Ascorbic Acid (VITAMIN C PO) Take 1 tablet by mouth daily.    aspirin EC 81 MG tablet Take 81 mg by mouth daily.    atorvastatin (LIPITOR) 80 MG tablet Take 1 tablet (80 mg total) by mouth at bedtime. Qty: 90 tablet, Refills: 0    budesonide (PULMICORT) 0.5 MG/2ML nebulizer solution Take 0.5 mg by nebulization 2 (two) times daily.     bumetanide (BUMEX) 0.5 MG tablet Take 0.5 mg by mouth daily.    busPIRone (BUSPAR) 15 MG tablet TAKE ONE TABLET BY MOUTH TWICE DAILY. Qty: 180 tablet, Refills: 3    carvedilol (COREG) 12.5 MG tablet Take 12.5 mg by mouth 2 (two) times daily with a meal.     fluticasone (FLONASE) 50 MCG/ACT nasal spray Place 2 sprays into both nostrils at bedtime.     formoterol (PERFOROMIST) 20 MCG/2ML nebulizer solution Inhale 20 mcg into the lungs 2 (two) times daily.     gabapentin (NEURONTIN) 300 MG capsule TAKE ONE CAPSULE BY MOUTH TWICE DAILY Qty: 60 capsule, Refills: 6    Hypromellose (ARTIFICIAL TEARS OP) Place 1 drop into both eyes daily.     insulin NPH Human (HUMULIN N,NOVOLIN N) 100 UNIT/ML injection Inject 24 Units into the skin at bedtime.     insulin regular (NOVOLIN R,HUMULIN R) 100 units/mL injection Inject 3-20 Units into the skin 3 (three) times daily before meals. Based on sliding scale    lactulose (CHRONULAC) 10 GM/15ML solution Take 15 mLs (10 g total) by mouth 2 (two) times daily as needed for mild constipation. Qty: 1892 mL, Refills: 5   Associated Diagnoses: Other constipation    !! levothyroxine (SYNTHROID, LEVOTHROID) 100 MCG tablet Take 1 tablet (100 mcg total) by mouth every other day. (alternate with the 112 mcg strength) Qty: 15 tablet, Refills: 2    !! levothyroxine (SYNTHROID,  LEVOTHROID) 112 MCG tablet Take 1 tablet (112 mcg total) by mouth every other day. (alternate with the 100 mcg strength) Qty: 15 tablet, Refills: 2    meclizine (ANTIVERT) 25 MG tablet Take 0.5-1 tablets (12.5-25 mg total) by mouth 3 (three) times daily as needed for dizziness. Qty: 30 tablet, Refills: 0    montelukast (SINGULAIR) 10 MG tablet Take 1 tablet (10 mg total) by mouth daily. Qty: 30 tablet, Refills: 11    nitroGLYCERIN (NITROSTAT) 0.4 MG SL tablet Place 0.4 mg under the tongue every 5 (five) minutes as needed for chest pain.    Omega-3 Fatty Acids (FISH OIL) 1200 MG CAPS Take 1 capsule by mouth daily.  OXYGEN Inhale 2 L into the lungs continuous.    pantoprazole (PROTONIX) 20 MG tablet TAKE ONE TABLET BY MOUTH TWICE DAILY Qty: 180 tablet, Refills: 1    ranitidine (ZANTAC) 300 MG tablet Take 1 tablet (300 mg total) by mouth at bedtime. Qty: 30 tablet, Refills: 11    rOPINIRole (REQUIP) 1 MG tablet Take 1 tablet (1 mg total) by mouth 3 (three) times daily. Qty: 90 tablet, Refills: 6    sertraline (ZOLOFT) 100 MG tablet TAKE ONE & ONE-HALF TABLETS BY MOUTH ONCE DAILY. Qty: 45 tablet, Refills: 6    spironolactone (ALDACTONE) 25 MG tablet Take 25 mg by mouth daily.    tiZANidine (ZANAFLEX) 2 MG tablet TAKE ONE TABLET BY MOUTH AT BEDTIME Qty: 30 tablet, Refills: 5    promethazine (PHENERGAN) 25 MG tablet Take 0.5-1 tablets (12.5-25 mg total) by mouth every 6 (six) hours as needed for nausea or vomiting. Qty: 20 tablet, Refills: 0     !! - Potential duplicate medications found. Please discuss with provider.     Allergies  Allergen Reactions  . Vasotec [Enalapril] Swelling    Throat swells  . Codeine Nausea And Vomiting  . Morphine And Related Nausea And Vomiting      The results of significant diagnostics from this hospitalization (including imaging, microbiology, ancillary and laboratory) are listed below for reference.    Significant Diagnostic Studies: Dg  Chest Portable 1 View  Result Date: 07/06/2016 CLINICAL DATA:  Shortness of breath. History of COPD and hypertension. EXAM: PORTABLE CHEST 1 VIEW COMPARISON:  Chest x-ray and chest CT 01/17/2016 FINDINGS: The heart is upper limits of normal in size given the AP projection and portable technique. The mediastinal and hilar contours appear normal in stable. Moderate atherosclerotic calcifications. Chronic emphysematous and bronchitic lung changes without definite acute overlying pulmonary process. No definite pleural effusion. The bony thorax is intact. IMPRESSION: Chronic lung changes without definite acute overlying pulmonary process. Electronically Signed   By: Marijo Sanes M.D.   On: 07/06/2016 12:03    Microbiology: Recent Results (from the past 240 hour(s))  Respiratory Panel by PCR     Status: Abnormal   Collection Time: 07/06/16  3:45 PM  Result Value Ref Range Status   Adenovirus NOT DETECTED NOT DETECTED Final   Coronavirus 229E NOT DETECTED NOT DETECTED Final   Coronavirus HKU1 NOT DETECTED NOT DETECTED Final   Coronavirus NL63 NOT DETECTED NOT DETECTED Final   Coronavirus OC43 NOT DETECTED NOT DETECTED Final   Metapneumovirus NOT DETECTED NOT DETECTED Final   Rhinovirus / Enterovirus DETECTED (A) NOT DETECTED Final   Influenza A NOT DETECTED NOT DETECTED Final   Influenza B NOT DETECTED NOT DETECTED Final   Parainfluenza Virus 1 NOT DETECTED NOT DETECTED Final   Parainfluenza Virus 2 NOT DETECTED NOT DETECTED Final   Parainfluenza Virus 3 NOT DETECTED NOT DETECTED Final   Parainfluenza Virus 4 NOT DETECTED NOT DETECTED Final   Respiratory Syncytial Virus NOT DETECTED NOT DETECTED Final   Bordetella pertussis NOT DETECTED NOT DETECTED Final   Chlamydophila pneumoniae NOT DETECTED NOT DETECTED Final   Mycoplasma pneumoniae NOT DETECTED NOT DETECTED Final  MRSA PCR Screening     Status: None   Collection Time: 07/06/16  3:45 PM  Result Value Ref Range Status   MRSA by PCR NEGATIVE  NEGATIVE Final    Comment:        The GeneXpert MRSA Assay (FDA approved for NASAL specimens only), is one component of a comprehensive  MRSA colonization surveillance program. It is not intended to diagnose MRSA infection nor to guide or monitor treatment for MRSA infections.      Labs: Basic Metabolic Panel:  Recent Labs Lab 07/06/16 1140 07/07/16 0300 07/08/16 0402  NA 140 137 139  K 4.1 3.7 4.5  CL 102 103 104  CO2 26 24 27   GLUCOSE 138* 223* 371*  BUN 15 21* 30*  CREATININE 1.01* 1.15* 1.16*  CALCIUM 9.0 8.3* 8.5*   Liver Function Tests:  Recent Labs Lab 07/07/16 0300  AST 24  ALT 16  ALKPHOS 88  BILITOT 1.5*  PROT 6.5  ALBUMIN 3.4*   No results for input(s): LIPASE, AMYLASE in the last 168 hours. No results for input(s): AMMONIA in the last 168 hours. CBC:  Recent Labs Lab 07/06/16 1140 07/07/16 0300 07/08/16 0402  WBC 12.2* 9.8 9.7  NEUTROABS 10.0*  --   --   HGB 11.7* 9.1* 9.5*  HCT 37.0 28.6* 30.4*  MCV 91.8 90.5 91.3  PLT 158 141* 145*   Cardiac Enzymes: No results for input(s): CKTOTAL, CKMB, CKMBINDEX, TROPONINI in the last 168 hours. BNP: BNP (last 3 results) No results for input(s): BNP in the last 8760 hours.  ProBNP (last 3 results) No results for input(s): PROBNP in the last 8760 hours.  CBG:  Recent Labs Lab 07/09/16 1203 07/09/16 1723 07/09/16 2146 07/10/16 0758 07/10/16 1223  GLUCAP 447* 421* 263* 278* 381*    Signed:  Velvet Bathe MD.  [facility] 07/10/2016, 1:44 PM

## 2016-07-13 DIAGNOSIS — M6281 Muscle weakness (generalized): Secondary | ICD-10-CM | POA: Diagnosis not present

## 2016-07-13 DIAGNOSIS — M545 Low back pain: Secondary | ICD-10-CM | POA: Diagnosis not present

## 2016-07-13 DIAGNOSIS — M25561 Pain in right knee: Secondary | ICD-10-CM | POA: Diagnosis not present

## 2016-07-13 DIAGNOSIS — I110 Hypertensive heart disease with heart failure: Secondary | ICD-10-CM | POA: Diagnosis not present

## 2016-07-13 DIAGNOSIS — E1142 Type 2 diabetes mellitus with diabetic polyneuropathy: Secondary | ICD-10-CM | POA: Diagnosis not present

## 2016-07-13 DIAGNOSIS — G8929 Other chronic pain: Secondary | ICD-10-CM | POA: Diagnosis not present

## 2016-07-15 DIAGNOSIS — R32 Unspecified urinary incontinence: Secondary | ICD-10-CM | POA: Diagnosis not present

## 2016-07-15 DIAGNOSIS — R8271 Bacteriuria: Secondary | ICD-10-CM | POA: Diagnosis not present

## 2016-07-16 ENCOUNTER — Telehealth: Payer: Self-pay | Admitting: Family Medicine

## 2016-07-16 DIAGNOSIS — G4733 Obstructive sleep apnea (adult) (pediatric): Secondary | ICD-10-CM | POA: Diagnosis not present

## 2016-07-16 DIAGNOSIS — J449 Chronic obstructive pulmonary disease, unspecified: Secondary | ICD-10-CM | POA: Diagnosis not present

## 2016-07-16 DIAGNOSIS — R05 Cough: Secondary | ICD-10-CM | POA: Diagnosis not present

## 2016-07-16 DIAGNOSIS — R0609 Other forms of dyspnea: Secondary | ICD-10-CM | POA: Diagnosis not present

## 2016-07-16 NOTE — Telephone Encounter (Signed)
That's fine; I'll need the diagnosis code they think is appropriate and then I'll need to see her within 30 days for that purpose. Thank you

## 2016-07-16 NOTE — Telephone Encounter (Signed)
Bonnie Henry from [facility] is requesting a verbal order for skill nurse to see her 2 times weekly for 3 weeks and then once weekly for 3 weeks. Also 2 as needed visits and to resume her physical therapy [phone]

## 2016-07-16 NOTE — Telephone Encounter (Signed)
Notified dx code is COPD, and unsteady with ambulation and fall prevention

## 2016-07-17 DIAGNOSIS — M545 Low back pain: Secondary | ICD-10-CM | POA: Diagnosis not present

## 2016-07-17 DIAGNOSIS — I110 Hypertensive heart disease with heart failure: Secondary | ICD-10-CM | POA: Diagnosis not present

## 2016-07-17 DIAGNOSIS — G8929 Other chronic pain: Secondary | ICD-10-CM | POA: Diagnosis not present

## 2016-07-17 DIAGNOSIS — E1142 Type 2 diabetes mellitus with diabetic polyneuropathy: Secondary | ICD-10-CM | POA: Diagnosis not present

## 2016-07-17 DIAGNOSIS — M6281 Muscle weakness (generalized): Secondary | ICD-10-CM | POA: Diagnosis not present

## 2016-07-17 DIAGNOSIS — M25561 Pain in right knee: Secondary | ICD-10-CM | POA: Diagnosis not present

## 2016-07-18 ENCOUNTER — Telehealth: Payer: Self-pay

## 2016-07-18 DIAGNOSIS — I110 Hypertensive heart disease with heart failure: Secondary | ICD-10-CM | POA: Diagnosis not present

## 2016-07-18 DIAGNOSIS — G8929 Other chronic pain: Secondary | ICD-10-CM | POA: Diagnosis not present

## 2016-07-18 DIAGNOSIS — M25561 Pain in right knee: Secondary | ICD-10-CM | POA: Diagnosis not present

## 2016-07-18 DIAGNOSIS — E1142 Type 2 diabetes mellitus with diabetic polyneuropathy: Secondary | ICD-10-CM | POA: Diagnosis not present

## 2016-07-18 DIAGNOSIS — M6281 Muscle weakness (generalized): Secondary | ICD-10-CM | POA: Diagnosis not present

## 2016-07-18 DIAGNOSIS — M545 Low back pain: Secondary | ICD-10-CM | POA: Diagnosis not present

## 2016-07-18 NOTE — Telephone Encounter (Signed)
[facility] called for PT for 2x week for 4 weeks: for balance training and dyspnea. They were also told patient would have to be seen within 30 days.  Verbal given

## 2016-07-21 DIAGNOSIS — M545 Low back pain: Secondary | ICD-10-CM | POA: Diagnosis not present

## 2016-07-21 DIAGNOSIS — M25561 Pain in right knee: Secondary | ICD-10-CM | POA: Diagnosis not present

## 2016-07-21 DIAGNOSIS — E1142 Type 2 diabetes mellitus with diabetic polyneuropathy: Secondary | ICD-10-CM | POA: Diagnosis not present

## 2016-07-21 DIAGNOSIS — I110 Hypertensive heart disease with heart failure: Secondary | ICD-10-CM | POA: Diagnosis not present

## 2016-07-21 DIAGNOSIS — G8929 Other chronic pain: Secondary | ICD-10-CM | POA: Diagnosis not present

## 2016-07-21 DIAGNOSIS — M6281 Muscle weakness (generalized): Secondary | ICD-10-CM | POA: Diagnosis not present

## 2016-07-22 DIAGNOSIS — M545 Low back pain: Secondary | ICD-10-CM | POA: Diagnosis not present

## 2016-07-22 DIAGNOSIS — I110 Hypertensive heart disease with heart failure: Secondary | ICD-10-CM | POA: Diagnosis not present

## 2016-07-22 DIAGNOSIS — M6281 Muscle weakness (generalized): Secondary | ICD-10-CM | POA: Diagnosis not present

## 2016-07-22 DIAGNOSIS — G8929 Other chronic pain: Secondary | ICD-10-CM | POA: Diagnosis not present

## 2016-07-22 DIAGNOSIS — M25561 Pain in right knee: Secondary | ICD-10-CM | POA: Diagnosis not present

## 2016-07-22 DIAGNOSIS — E1142 Type 2 diabetes mellitus with diabetic polyneuropathy: Secondary | ICD-10-CM | POA: Diagnosis not present

## 2016-07-23 ENCOUNTER — Ambulatory Visit (INDEPENDENT_AMBULATORY_CARE_PROVIDER_SITE_OTHER): Payer: Medicare Other | Admitting: Family Medicine

## 2016-07-23 ENCOUNTER — Encounter: Payer: Self-pay | Admitting: Family Medicine

## 2016-07-23 DIAGNOSIS — M545 Low back pain: Secondary | ICD-10-CM

## 2016-07-23 DIAGNOSIS — G8929 Other chronic pain: Secondary | ICD-10-CM

## 2016-07-23 DIAGNOSIS — L84 Corns and callosities: Secondary | ICD-10-CM | POA: Diagnosis not present

## 2016-07-23 DIAGNOSIS — M1711 Unilateral primary osteoarthritis, right knee: Secondary | ICD-10-CM

## 2016-07-23 DIAGNOSIS — I6523 Occlusion and stenosis of bilateral carotid arteries: Secondary | ICD-10-CM

## 2016-07-23 NOTE — Progress Notes (Signed)
BP 114/72   Pulse 60   Temp 98.2 F (36.8 C) (Oral)   Resp 16   Wt 206 lb (93.4 kg)   SpO2 98%   BMI 40.23 kg/m    Subjective:    Patient ID: Bonnie Henry, female    DOB: Sep 13, 1940, 76 y.o.   MRN: [id number]  HPI: TEREKA Henry is a 76 y.o. female  Chief Complaint  Patient presents with  . discuss order    for tens unit    HPI Patient is here with daughter C/o foot pain; toes of both feet; burning feeling; seeing podiatrist; pain with touch; thickened nail 2nd toe RIGHT foot She would like a TENS unit The last time she was here, they gave her a brochure from someone for a TENS unit Pain medicine is not really helping Thinking about a TENS unit Very sharp pain right across the middle of the low back; hurts to flex at the waist Last CT scan of abd/pelvis showed: Musculoskeletal: Degenerative changes in the spine.   Right leg bothering her, the knee She does not want to go back to ortho for injections Biofreeze didn't help Lidocaine didn't help  ---------------------------------------------- Right knee xray July 21, 2015: IMPRESSION: Severe degenerative joint disease is noted medially. No acute abnormality seen in the right knee.   Electronically Signed   By: Marijo Conception, M.D.   On: 07/21/2015 15:38 ----------------------------------------------  Back of the right hand gets cold at times; fingers do not turn blue or purple; that's painful when that happens; burns like fire; she has a vein and vascular; they will see him again; they cleaned out her neck  She has been getting headaches; more frequent now, but prednisone is jumping her sugars going up; sugars fluctuate; she doesn't behave; Dr. Burnard Bunting has her taking novolin R three times a day with meals plus sliding scale; using Novolin N at night; suggested that they call about N twice a day and reduce the R  Has urologist keeping up with her input and output; he will run a test next month and  insert catheter  Depression screen [facility] 2/9 05/02/2016 01/29/2016 01/15/2016 06/08/2015 05/04/2015  Decreased Interest 0 0 0 0 0  Down, Depressed, Hopeless 1 0 1 0 0  PHQ - 2 Score 1 0 1 0 0  Altered sleeping - - - - -  Tired, decreased energy - - - - -  Change in appetite - - - - -  Feeling bad or failure about yourself  - - - - -  Trouble concentrating - - - - -  Moving slowly or fidgety/restless - - - - -  Suicidal thoughts - - - - -  PHQ-9 Score - - - - -  Difficult doing work/chores - - - - -    Relevant past medical, surgical, family and social history reviewed Past Medical History:  Diagnosis Date  . Anemia   . Anxiety   . Arthritis    "joints ache all over" . osteoarthritis  . Asthma   . Atrial fibrillation ([place])   . Breast cancer, left breast (HCC)    S/P mastectomy  . CAD (coronary artery disease)   . CHF (congestive heart failure) ([place])   . Chronic back pain   . Constipation 03/04/2015  . COPD (chronic obstructive pulmonary disease) ([place])   . Depression, major   . Diabetes mellitus without complication ([place])   . DVT (deep venous thrombosis) ([place])   . GERD (  gastroesophageal reflux disease)   . Headache   . Heart murmur   . Hepatitis    HEP "C"  . History of blood transfusion    "when I had my mastectomy"  . History of hiatal hernia   . History of right-sided carotid endarterectomy 06/10/2015  . Hypercholesteremia   . Hypertension   . Morbid obesity ([place])   . Myocardial infarction ([place])   . Neuropathy   . Peripheral vascular disease ([place])   . Post-surgical hypothyroidism   . Restless leg syndrome   . Shortness of breath dyspnea   . Sleep apnea    "2015 wore mask; lost weight; told didn't need mask anymore" (02/23/2014)  . Type II diabetes mellitus ([place])    Past Surgical History:  Procedure Laterality Date  . ARTERY BIOPSY Right 09/29/2014   Procedure: BIOPSY TEMPORAL ARTERY;  Surgeon: Angelia Mould, MD;  Location: [facility];  Service: Vascular;   Laterality: Right;  . BARTHOLIN CYST MARSUPIALIZATION    . BREAST BIOPSY Left    Mastectomy  . CARDIAC CATHETERIZATION  2000's  . CAROTID ENDARTERECTOMY Left 2009  . CATARACT EXTRACTION W/ INTRAOCULAR LENS  IMPLANT, BILATERAL Bilateral ~ 2011  . CORONARY ANGIOPLASTY WITH STENT PLACEMENT  1990's X 2   "2; 1"  . ENDARTERECTOMY Right 05/16/2015   Procedure: ENDARTERECTOMY CAROTID;  Surgeon: Katha Cabal, MD;  Location: [facility];  Service: Vascular;  Laterality: Right;  . ESOPHAGOGASTRODUODENOSCOPY (EGD) WITH PROPOFOL N/A 12/12/2014   Procedure: ESOPHAGOGASTRODUODENOSCOPY (EGD) WITH PROPOFOL;  Surgeon: Lucilla Lame, MD;  Location: [facility];  Service: Endoscopy;  Laterality: N/A;  . EYE SURGERY Bilateral    Catract Extraction with IOL  . HAMMER TOE SURGERY Left    2nd and 3rd digits  . JOINT REPLACEMENT Bilateral    Total Hip Replacement  . JOINT REPLACEMENT Left    Total Knee Replacement  . LEFT HEART CATHETERIZATION WITH CORONARY ANGIOGRAM N/A 02/24/2014   Procedure: LEFT HEART CATHETERIZATION WITH CORONARY ANGIOGRAM;  Surgeon: Leonie Man, MD;  Location: [facility] CATH LAB;  Service: Cardiovascular;  Laterality: N/A;  . MASTECTOMY Left 1990's  . SHOULDER SURGERY Right    "cleaned it out; no scope"  . THYROIDECTOMY, PARTIAL  1950's  . TOE SURGERY Right    "cut piece of bone out so toe didn't dig into my foot"  . TONSILLECTOMY  1950's  . TOTAL HIP ARTHROPLASTY Bilateral 1990's  . TOTAL KNEE ARTHROPLASTY Left 1990's  . TRIGGER FINGER RELEASE Left   . TUBAL LIGATION    . VAGINAL HYSTERECTOMY     "partial"   Family History  Problem Relation Age of Onset  . Arthritis Mother   . Cancer Mother   . Diabetes Mother   . Heart disease Mother   . Hyperlipidemia Mother   . Hypertension Mother   . Alcohol abuse Father   . Brain cancer Father   . Alcohol abuse Brother   . Arthritis Brother   . Asthma Brother   . HIV Brother   . Cancer Brother   . Diabetes Brother   .  Hyperlipidemia Brother   . Hypertension Brother   . Lung disease Brother   . Arthritis Maternal Grandmother    Social History   Social History  . Marital status: Widowed    Spouse name: N/A  . Number of children: N/A  . Years of education: N/A   Occupational History  . Not on file.   Social History Main Topics  . Smoking status:  Former Smoker    Packs/day: 1.50    Years: 44.00    Types: Cigarettes    Quit date: 01/06/1994  . Smokeless tobacco: Never Used     Comment: "quit smoking cigarettes in ~ 2000"  . Alcohol use No     Comment: 02/23/2014 "no alcohol since 1990's"  . Drug use: No  . Sexual activity: No   Other Topics Concern  . Not on file   Social History Narrative   Lives with daughter in a mobile home.  Has 4 children alive, 1 deceased.  Retired from [employer].  Education: high school.    Interim medical history since last visit reviewed. Allergies and medications reviewed  Review of Systems Per HPI unless specifically indicated above     Objective:    BP 114/72   Pulse 60   Temp 98.2 F (36.8 C) (Oral)   Resp 16   Wt 206 lb (93.4 kg)   SpO2 98%   BMI 40.23 kg/m   Wt Readings from Last 3 Encounters:  07/23/16 206 lb (93.4 kg)  07/10/16 200 lb 4.8 oz (90.9 kg)  05/29/16 209 lb (94.8 kg)    Physical Exam  Constitutional: She appears well-nourished. No distress.  Elderly female seated in wheelchair; gait not assessed; appears chronically ill  Cardiovascular: Normal rate.   Pulmonary/Chest: Effort normal.  Abdominal: She exhibits no distension.  Musculoskeletal:       Right knee: She exhibits decreased range of motion (with both flexion and extension of the RIGHT knee), swelling, effusion and deformity. She exhibits no ecchymosis. Tenderness found. Patellar tendon (inferior) tenderness noted. No medial joint line and no lateral joint line tenderness noted.       Lumbar back: She exhibits decreased range of motion and tenderness.  Neurological: She is  alert.  Skin:  Persistent callus on ball of right foot without ulceration or erythema  Psychiatric: Her mood appears not anxious. She does not exhibit a depressed mood.   Diabetic Foot Form - Detailed   Diabetic Foot Exam - detailed Diabetic Foot exam was performed with the following findings:  Yes 07/23/2016 10:09 AM  Visual Foot Exam completed.:  Yes  Is there swelling or and abnormal foot shape?:  No Pulse Foot Exam completed.:  Yes  Right Dorsalis Pedis:  Present Left Dorsalis Pedis:  Present  Sensory Foot Exam Completed.:  Yes Semmes-Weinstein Monofilament Test R Site 1-Great Toe:  Pos L Site 1-Great Toe:  Pos           Assessment & Plan:   Problem List Items Addressed This Visit      Musculoskeletal and Integument   Osteoarthritis of right knee    "Severe" noted on last xrays July 2017; will not get xrays again today as likely progressed, but won't change plan; patient does not want to see ortho; did not get relief from Biofreeze or Lidocaine; will be glad to order TENS unit for her      Relevant Medications   predniSONE (DELTASONE) 10 MG tablet   Foot callus    Persistent, right ball of foot, no ulceration        Other   Morbid obesity ([place])    Encouraged weight loss; eat smaller portions, drink plenty of water; weight loss would help so many of her chronic health issues, including her knee arthritis      Low back pain    Chronic issue; I will certainly be glad to sign for a TENS unit; she may  choose the company and then I will be happy to sign orders      Relevant Medications   predniSONE (DELTASONE) 10 MG tablet      Follow up plan: No Follow-up on file.  An after-visit summary was printed and given to the patient at [place].  Please see the patient instructions which may contain other information and recommendations beyond what is mentioned above in the assessment and plan.  Meds ordered this encounter  Medications  . doxycycline (VIBRAMYCIN) 100 MG  capsule    Sig: Take 100 mg by mouth 2 (two) times daily.  . predniSONE (DELTASONE) 10 MG tablet    Sig: Take 10 mg by mouth daily.    No orders of the defined types were placed in this encounter.

## 2016-07-23 NOTE — Assessment & Plan Note (Signed)
Persistent, right ball of foot, no ulceration

## 2016-07-23 NOTE — Patient Instructions (Signed)
Do please call your endocrinologist about switching your insulin up Call me with the company you want to use for the TENS unit Eat smaller portions, drink plenty of water

## 2016-07-23 NOTE — Assessment & Plan Note (Signed)
"  Severe" noted on last xrays July 2017; will not get xrays again today as likely progressed, but won't change plan; patient does not want to see ortho; did not get relief from Biofreeze or Lidocaine; will be glad to order TENS unit for her

## 2016-07-24 DIAGNOSIS — M545 Low back pain: Secondary | ICD-10-CM | POA: Diagnosis not present

## 2016-07-24 DIAGNOSIS — M6281 Muscle weakness (generalized): Secondary | ICD-10-CM | POA: Diagnosis not present

## 2016-07-24 DIAGNOSIS — G8929 Other chronic pain: Secondary | ICD-10-CM | POA: Diagnosis not present

## 2016-07-24 DIAGNOSIS — E1142 Type 2 diabetes mellitus with diabetic polyneuropathy: Secondary | ICD-10-CM | POA: Diagnosis not present

## 2016-07-24 DIAGNOSIS — I110 Hypertensive heart disease with heart failure: Secondary | ICD-10-CM | POA: Diagnosis not present

## 2016-07-24 DIAGNOSIS — M25561 Pain in right knee: Secondary | ICD-10-CM | POA: Diagnosis not present

## 2016-07-29 ENCOUNTER — Telehealth: Payer: Self-pay | Admitting: Family Medicine

## 2016-07-29 DIAGNOSIS — G8929 Other chronic pain: Secondary | ICD-10-CM | POA: Diagnosis not present

## 2016-07-29 DIAGNOSIS — I110 Hypertensive heart disease with heart failure: Secondary | ICD-10-CM | POA: Diagnosis not present

## 2016-07-29 DIAGNOSIS — M6281 Muscle weakness (generalized): Secondary | ICD-10-CM | POA: Diagnosis not present

## 2016-07-29 DIAGNOSIS — M25561 Pain in right knee: Secondary | ICD-10-CM | POA: Diagnosis not present

## 2016-07-29 DIAGNOSIS — M545 Low back pain: Secondary | ICD-10-CM | POA: Diagnosis not present

## 2016-07-29 DIAGNOSIS — E1142 Type 2 diabetes mellitus with diabetic polyneuropathy: Secondary | ICD-10-CM | POA: Diagnosis not present

## 2016-07-29 NOTE — Telephone Encounter (Signed)
ERRENOUS °

## 2016-07-30 ENCOUNTER — Telehealth: Payer: Self-pay

## 2016-07-30 NOTE — Telephone Encounter (Signed)
[facility] nurse Augusto Garbe called wanted to inform you patient states has chest pain(burning sensation) with sob on and off for years.  States when she breaths in it hurts and has experienced dizziness.  This is not a everyday thing but wanted to let you know what the patient told her.

## 2016-07-31 DIAGNOSIS — G8929 Other chronic pain: Secondary | ICD-10-CM | POA: Diagnosis not present

## 2016-07-31 DIAGNOSIS — I110 Hypertensive heart disease with heart failure: Secondary | ICD-10-CM | POA: Diagnosis not present

## 2016-07-31 DIAGNOSIS — M25561 Pain in right knee: Secondary | ICD-10-CM | POA: Diagnosis not present

## 2016-07-31 DIAGNOSIS — E1142 Type 2 diabetes mellitus with diabetic polyneuropathy: Secondary | ICD-10-CM | POA: Diagnosis not present

## 2016-07-31 DIAGNOSIS — M545 Low back pain: Secondary | ICD-10-CM | POA: Diagnosis not present

## 2016-07-31 DIAGNOSIS — M6281 Muscle weakness (generalized): Secondary | ICD-10-CM | POA: Diagnosis not present

## 2016-08-04 DIAGNOSIS — M545 Low back pain: Secondary | ICD-10-CM | POA: Diagnosis not present

## 2016-08-04 DIAGNOSIS — E1142 Type 2 diabetes mellitus with diabetic polyneuropathy: Secondary | ICD-10-CM | POA: Diagnosis not present

## 2016-08-04 DIAGNOSIS — G8929 Other chronic pain: Secondary | ICD-10-CM | POA: Diagnosis not present

## 2016-08-04 DIAGNOSIS — M25561 Pain in right knee: Secondary | ICD-10-CM | POA: Diagnosis not present

## 2016-08-04 DIAGNOSIS — I110 Hypertensive heart disease with heart failure: Secondary | ICD-10-CM | POA: Diagnosis not present

## 2016-08-04 DIAGNOSIS — M6281 Muscle weakness (generalized): Secondary | ICD-10-CM | POA: Diagnosis not present

## 2016-08-05 DIAGNOSIS — R32 Unspecified urinary incontinence: Secondary | ICD-10-CM | POA: Diagnosis not present

## 2016-08-05 NOTE — Assessment & Plan Note (Signed)
Encouraged weight loss; eat smaller portions, drink plenty of water; weight loss would help so many of her chronic health issues, including her knee arthritis

## 2016-08-05 NOTE — Assessment & Plan Note (Signed)
Chronic issue; I will certainly be glad to sign for a TENS unit; she may choose the company and then I will be happy to sign orders

## 2016-08-06 ENCOUNTER — Encounter: Payer: Self-pay | Admitting: Family Medicine

## 2016-08-06 ENCOUNTER — Ambulatory Visit (INDEPENDENT_AMBULATORY_CARE_PROVIDER_SITE_OTHER): Payer: Medicare Other | Admitting: Family Medicine

## 2016-08-06 VITALS — BP 122/64 | HR 62 | Temp 98.0°F | Resp 16 | Wt 206.6 lb

## 2016-08-06 DIAGNOSIS — R51 Headache: Secondary | ICD-10-CM

## 2016-08-06 DIAGNOSIS — J449 Chronic obstructive pulmonary disease, unspecified: Secondary | ICD-10-CM | POA: Diagnosis not present

## 2016-08-06 DIAGNOSIS — R519 Headache, unspecified: Secondary | ICD-10-CM

## 2016-08-06 DIAGNOSIS — J9611 Chronic respiratory failure with hypoxia: Secondary | ICD-10-CM

## 2016-08-06 DIAGNOSIS — D649 Anemia, unspecified: Secondary | ICD-10-CM | POA: Diagnosis not present

## 2016-08-06 DIAGNOSIS — E0500 Thyrotoxicosis with diffuse goiter without thyrotoxic crisis or storm: Secondary | ICD-10-CM

## 2016-08-06 DIAGNOSIS — I2510 Atherosclerotic heart disease of native coronary artery without angina pectoris: Secondary | ICD-10-CM | POA: Diagnosis not present

## 2016-08-06 DIAGNOSIS — R413 Other amnesia: Secondary | ICD-10-CM | POA: Diagnosis not present

## 2016-08-06 DIAGNOSIS — E7800 Pure hypercholesterolemia, unspecified: Secondary | ICD-10-CM | POA: Diagnosis not present

## 2016-08-06 DIAGNOSIS — I6523 Occlusion and stenosis of bilateral carotid arteries: Secondary | ICD-10-CM | POA: Diagnosis not present

## 2016-08-06 DIAGNOSIS — E1121 Type 2 diabetes mellitus with diabetic nephropathy: Secondary | ICD-10-CM

## 2016-08-06 DIAGNOSIS — Z5181 Encounter for therapeutic drug level monitoring: Secondary | ICD-10-CM | POA: Diagnosis not present

## 2016-08-06 DIAGNOSIS — S51019A Laceration without foreign body of unspecified elbow, initial encounter: Secondary | ICD-10-CM

## 2016-08-06 DIAGNOSIS — R296 Repeated falls: Secondary | ICD-10-CM

## 2016-08-06 DIAGNOSIS — F329 Major depressive disorder, single episode, unspecified: Secondary | ICD-10-CM | POA: Diagnosis not present

## 2016-08-06 DIAGNOSIS — Z9861 Coronary angioplasty status: Secondary | ICD-10-CM

## 2016-08-06 DIAGNOSIS — F32A Depression, unspecified: Secondary | ICD-10-CM

## 2016-08-06 LAB — CBC
HEMATOCRIT: 32.3 % — AB (ref 35.0–45.0)
HEMOGLOBIN: 10.4 g/dL — AB (ref 11.7–15.5)
MCH: 29.5 pg (ref 27.0–33.0)
MCHC: 32.2 g/dL (ref 32.0–36.0)
MCV: 91.5 fL (ref 80.0–100.0)
MPV: 10.6 fL (ref 7.5–12.5)
Platelets: 168 10*3/uL (ref 140–400)
RBC: 3.53 MIL/uL — AB (ref 3.80–5.10)
RDW: 15.1 % — ABNORMAL HIGH (ref 11.0–15.0)
WBC: 5.4 10*3/uL (ref 3.8–10.8)

## 2016-08-06 LAB — LIPID PANEL
Cholesterol: 181 mg/dL (ref <200)
HDL: 69 mg/dL (ref >50)
LDL CALC: 82 mg/dL (ref <100)
TRIGLYCERIDES: 148 mg/dL (ref <150)
Total CHOL/HDL Ratio: 2.6 Ratio (ref <5.0)
VLDL: 30 mg/dL (ref <30)

## 2016-08-06 LAB — TSH: TSH: 2.14 m[IU]/L

## 2016-08-06 LAB — ALT: ALT: 20 U/L (ref 6–29)

## 2016-08-06 MED ORDER — DULOXETINE HCL 30 MG PO CPEP: ORAL_CAPSULE | ORAL | 0 refills | Status: DC

## 2016-08-06 NOTE — Assessment & Plan Note (Signed)
On supplemental oxygen ATC; seeing pulmonologist

## 2016-08-06 NOTE — Assessment & Plan Note (Signed)
Will be referring her to neurologist; skin tear and bruises noted today; she is receiving physical therapy

## 2016-08-06 NOTE — Assessment & Plan Note (Signed)
Refer to ophthalmologist 

## 2016-08-06 NOTE — Assessment & Plan Note (Signed)
Check lipids today; on statin; on aspirin

## 2016-08-06 NOTE — Assessment & Plan Note (Signed)
Refer to neurologist 

## 2016-08-06 NOTE — Assessment & Plan Note (Signed)
On prednisone, seeing pulmonologist

## 2016-08-06 NOTE — Assessment & Plan Note (Signed)
Checking lipids today; goal LDL is less than 70

## 2016-08-06 NOTE — Progress Notes (Signed)
BP 122/64   Pulse 62   Temp 98 F (36.7 C) (Oral)   Resp 16   Wt 206 lb 9.6 oz (93.7 kg)   SpO2 97%   BMI 40.35 kg/m    Subjective:    Patient ID: Bonnie Henry, female    DOB: April 24, 1940, 76 y.o.   MRN: [id number]  HPI: Bonnie Henry is a 76 y.o. female  Chief Complaint  Patient presents with  . Headache    Awhile  . Fall    bruses on right breast amd stomach, forarm and right arm. split open on the right arm    Headache    Fall  Associated symptoms include headaches. Pertinent negatives include no hematuria.   She fell two days ago when bending forward on one side of the chair; bruised her abdomen, right breast, right arm and suffered a skin tear on the right elbow; hit the side of her head, no injury; no LOC Seeing physical therapy Constant headaches; going on for at least a year; has had head scanned, didn't find anything abnormal except fatty tissue behind left eye; the protrusion is getting worse; the eye doctor saw her and noticed it, didn't think anything dangerous; would be annoying for a while and then needs something done Hypothyroidism; managed by endocrinologist; has Graves disease Type 2 diabetes; managed by endo; sugars are running up and down She had labs done in the hospital, noted BMP and CBC She is more depressed lately; can't do what she used to do; not walking like before; pain is getting worse; right knee getting worse, back getting worse; wheelchair, walker at home; hospital bed; does not want to see a pain doctor; has seen one; sensitive to narcotics COPD; on prednisone daily per pulm, using ATC oxygen  Depression screen [facility] 2/9 08/06/2016 05/02/2016 01/29/2016 01/15/2016 06/08/2015  Decreased Interest 0 0 0 0 0  Down, Depressed, Hopeless 1 1 0 1 0  PHQ - 2 Score 1 1 0 1 0  Altered sleeping - - - - -  Tired, decreased energy - - - - -  Change in appetite - - - - -  Feeling bad or failure about yourself  - - - - -  Trouble concentrating - -  - - -  Moving slowly or fidgety/restless - - - - -  Suicidal thoughts - - - - -  PHQ-9 Score - - - - -  Difficult doing work/chores - - - - -    Relevant past medical, surgical, family and social history reviewed Past Medical History:  Diagnosis Date  . Anemia   . Anxiety   . Arthritis    "joints ache all over" . osteoarthritis  . Asthma   . Atrial fibrillation ([place])   . Breast cancer, left breast (HCC)    S/P mastectomy  . CAD (coronary artery disease)   . CHF (congestive heart failure) ([place])   . Chronic back pain   . Constipation 03/04/2015  . COPD (chronic obstructive pulmonary disease) ([place])   . Depression, major   . Diabetes mellitus without complication ([place])   . DVT (deep venous thrombosis) ([place])   . GERD (gastroesophageal reflux disease)   . Headache   . Heart murmur   . Hepatitis    HEP "C"  . History of blood transfusion    "when I had my mastectomy"  . History of hiatal hernia   . History of right-sided carotid endarterectomy 06/10/2015  . Hypercholesteremia   .  Hypertension   . Morbid obesity ([place])   . Myocardial infarction ([place])   . Neuropathy   . Peripheral vascular disease ([place])   . Post-surgical hypothyroidism   . Restless leg syndrome   . Shortness of breath dyspnea   . Sleep apnea    "2015 wore mask; lost weight; told didn't need mask anymore" (02/23/2014)  . Type II diabetes mellitus ([place])    Past Surgical History:  Procedure Laterality Date  . ARTERY BIOPSY Right 09/29/2014   Procedure: BIOPSY TEMPORAL ARTERY;  Surgeon: Angelia Mould, MD;  Location: [facility];  Service: Vascular;  Laterality: Right;  . BARTHOLIN CYST MARSUPIALIZATION    . BREAST BIOPSY Left    Mastectomy  . CARDIAC CATHETERIZATION  2000's  . CAROTID ENDARTERECTOMY Left 2009  . CATARACT EXTRACTION W/ INTRAOCULAR LENS  IMPLANT, BILATERAL Bilateral ~ 2011  . CORONARY ANGIOPLASTY WITH STENT PLACEMENT  1990's X 2   "2; 1"  . ENDARTERECTOMY Right 05/16/2015   Procedure:  ENDARTERECTOMY CAROTID;  Surgeon: Katha Cabal, MD;  Location: [facility];  Service: Vascular;  Laterality: Right;  . ESOPHAGOGASTRODUODENOSCOPY (EGD) WITH PROPOFOL N/A 12/12/2014   Procedure: ESOPHAGOGASTRODUODENOSCOPY (EGD) WITH PROPOFOL;  Surgeon: Lucilla Lame, MD;  Location: [facility];  Service: Endoscopy;  Laterality: N/A;  . EYE SURGERY Bilateral    Catract Extraction with IOL  . HAMMER TOE SURGERY Left    2nd and 3rd digits  . JOINT REPLACEMENT Bilateral    Total Hip Replacement  . JOINT REPLACEMENT Left    Total Knee Replacement  . LEFT HEART CATHETERIZATION WITH CORONARY ANGIOGRAM N/A 02/24/2014   Procedure: LEFT HEART CATHETERIZATION WITH CORONARY ANGIOGRAM;  Surgeon: Leonie Man, MD;  Location: [facility] CATH LAB;  Service: Cardiovascular;  Laterality: N/A;  . MASTECTOMY Left 1990's  . SHOULDER SURGERY Right    "cleaned it out; no scope"  . THYROIDECTOMY, PARTIAL  1950's  . TOE SURGERY Right    "cut piece of bone out so toe didn't dig into my foot"  . TONSILLECTOMY  1950's  . TOTAL HIP ARTHROPLASTY Bilateral 1990's  . TOTAL KNEE ARTHROPLASTY Left 1990's  . TRIGGER FINGER RELEASE Left   . TUBAL LIGATION    . VAGINAL HYSTERECTOMY     "partial"   Family History  Problem Relation Age of Onset  . Arthritis Mother   . Cancer Mother   . Diabetes Mother   . Heart disease Mother   . Hyperlipidemia Mother   . Hypertension Mother   . Alcohol abuse Father   . Brain cancer Father   . Alcohol abuse Brother   . Arthritis Brother   . Asthma Brother   . HIV Brother   . Cancer Brother   . Diabetes Brother   . Hyperlipidemia Brother   . Hypertension Brother   . Lung disease Brother   . Arthritis Maternal Grandmother    Social History   Social History  . Marital status: Widowed    Spouse name: N/A  . Number of children: N/A  . Years of education: N/A   Occupational History  . Not on file.   Social History Main Topics  . Smoking status: Former Smoker    Packs/day:  1.50    Years: 44.00    Types: Cigarettes    Quit date: 01/06/1994  . Smokeless tobacco: Never Used     Comment: "quit smoking cigarettes in ~ 2000"  . Alcohol use No     Comment: 02/23/2014 "no alcohol since 1990's"  .  Drug use: No  . Sexual activity: No   Other Topics Concern  . Not on file   Social History Narrative   Lives with daughter in a mobile home.  Has 4 children alive, 1 deceased.  Retired from [employer].  Education: high school.    Interim medical history since last visit reviewed. Allergies and medications reviewed  Review of Systems  Constitutional: Positive for unexpected weight change.  Gastrointestinal: Negative for blood in stool.  Genitourinary: Negative for hematuria.  Neurological: Positive for headaches.   Per HPI unless specifically indicated above     Objective:    BP 122/64   Pulse 62   Temp 98 F (36.7 C) (Oral)   Resp 16   Wt 206 lb 9.6 oz (93.7 kg)   SpO2 97%   BMI 40.35 kg/m   Wt Readings from Last 3 Encounters:  08/06/16 206 lb 9.6 oz (93.7 kg)  07/23/16 206 lb (93.4 kg)  07/10/16 200 lb 4.8 oz (90.9 kg)    Physical Exam  Constitutional: She appears well-developed and well-nourished. No distress.  Elderly female seated in wheelchair; gait not assessed; appears chronically ill  Eyes: Right eye exhibits no discharge and no hordeolum. Left eye exhibits no discharge and no hordeolum. Right conjunctiva is not injected. Left conjunctiva is not injected. No scleral icterus.  Exophthalmos of RIGHT eye more than left; almost complete loss of crease over the right upper eyelid  Neck: No thyromegaly present.  Cardiovascular: Normal rate.   Pulmonary/Chest: Effort normal.  Abdominal: She exhibits no distension. There is tenderness (over site of bruise, but not rigid).    Musculoskeletal:       Right knee: She exhibits decreased range of motion (with both flexion and extension of the RIGHT knee), swelling, effusion and deformity. She exhibits no  ecchymosis. Tenderness found. Patellar tendon (inferior) tenderness noted. No medial joint line and no lateral joint line tenderness noted.       Lumbar back: She exhibits decreased range of motion and tenderness.  Neurological: She is alert.  Skin: Bruising and ecchymosis noted.     2 cm skin tear right elbow, flap lesion  Psychiatric: Her mood appears not anxious. She does not exhibit a depressed mood.   Diabetic Foot Form - Detailed   Diabetic Foot Exam - detailed Diabetic Foot exam was performed with the following findings:  Yes 08/06/2016 10:00 AM  Visual Foot Exam completed.:  Yes  Are the shoes appropriate in style and fit?:  Yes Is there swelling or and abnormal foot shape?:  No Is there elevated skin temparature?:  No Pulse Foot Exam completed.:  Yes  Right Dorsalis Pedis:  Present Left Dorsalis Pedis:  Present  Semmes-Weinstein Monofilament Test R Site 1-Great Toe:  Pos L Site 1-Great Toe:  Pos           Assessment & Plan:   Problem List Items Addressed This Visit      Cardiovascular and Mediastinum   Carotid stenosis    Checking lipids today; goal LDL is less than 70      CAD S/P remote PCI  (Chronic)    Check lipids today; on statin; on aspirin        Respiratory   COPD, moderate ([place])    On prednisone, seeing pulmonologist      Chronic respiratory failure with hypoxia ([place])    On supplemental oxygen ATC; seeing pulmonologist        Endocrine   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)    Foot exam by MD; A1c managed by endo      Graves' disease with exophthalmos    Refer to ophthalmologist      Relevant Orders   Ambulatory referral to Ophthalmology   TSH     Other   Medication monitoring encounter   High cholesterol (Chronic)   Memory loss, short term    Refer to neurologist      Relevant Orders   Ambulatory referral to Neurology   Frequent headaches - Primary    Refer to neurologist and chiropractor      Relevant  Medications   DULoxetine (CYMBALTA) 30 MG capsule   Other Relevant Orders   Ambulatory referral to Chiropractic   Ambulatory referral to Neurology   Frequent falls    Will be referring her to neurologist; skin tear and bruises noted today; she is receiving physical therapy      Clinical depression    Tired of the pain she says; can't get around like she used to; discussed new normal; will switch from sertraline to cymbalta, which should also give [place] some improvement in her pain; close f/u; taper off SSRI as we start SNRI; discussed s/s of serotonin syndrome, reasons to contact me right away; close f/u      Relevant Medications   DULoxetine (CYMBALTA) 30 MG capsule   Anemia    Check labs      Relevant Orders   CBC   Fe+TIBC+Fer    Other Visit Diagnoses    Skin tear of elbow without complication, initial encounter       dressed with antibiotic ointment and bandage; no evidence of infection; pt receiving PT because of falls      Follow up plan: Return in about 4 weeks (around 09/03/2016) for follow-up visit with Dr. Sanda Klein.  An after-visit summary was printed and given to the patient at [place].  Please see the patient instructions which may contain other information and recommendations beyond what is mentioned above in the assessment and plan.  Meds ordered this encounter  Medications  . DULoxetine (CYMBALTA) 30 MG capsule    Sig: One by mouth daily for ten days, then two a day    Dispense:  50 capsule    Refill:  0    Switching over from sertraline to cymbalta    Orders Placed This Encounter  Procedures  . CBC  . TSH  . Fe+TIBC+Fer  . Ambulatory referral to Ophthalmology  . Ambulatory referral to Chiropractic  . Ambulatory referral to Neurology  Face-to-face time with patient was more than 40 minutes, >50% time spent counseling and coordination of care

## 2016-08-06 NOTE — Patient Instructions (Addendum)
Starting Thursday August 2nd - take 30 mg of Cymbalta and 100 mg sertraline Starting on Tuesday August 7th - take 30 mg of Cymbalta and 50 mg of sertraline Starting on Sunday August 12th - take 60 mg of Cymbalta and NO sertraline  We'll have you see the chiropractor and the eye doctor We'll get labs today

## 2016-08-06 NOTE — Assessment & Plan Note (Addendum)
Tired of the pain she says; can't get around like she used to; discussed new normal; will switch from sertraline to cymbalta, which should also give [place] some improvement in her pain; close f/u; taper off SSRI as we start SNRI; discussed s/s of serotonin syndrome, reasons to contact me right away; close f/u

## 2016-08-06 NOTE — Assessment & Plan Note (Signed)
Foot exam by MD; A1c managed by endo

## 2016-08-06 NOTE — Assessment & Plan Note (Signed)
Check labs 

## 2016-08-06 NOTE — Assessment & Plan Note (Signed)
Refer to neurologist and chiropractor

## 2016-08-07 ENCOUNTER — Other Ambulatory Visit: Payer: Self-pay | Admitting: Family Medicine

## 2016-08-07 DIAGNOSIS — I110 Hypertensive heart disease with heart failure: Secondary | ICD-10-CM | POA: Diagnosis not present

## 2016-08-07 DIAGNOSIS — E1142 Type 2 diabetes mellitus with diabetic polyneuropathy: Secondary | ICD-10-CM | POA: Diagnosis not present

## 2016-08-07 DIAGNOSIS — M6281 Muscle weakness (generalized): Secondary | ICD-10-CM | POA: Diagnosis not present

## 2016-08-07 DIAGNOSIS — G8929 Other chronic pain: Secondary | ICD-10-CM | POA: Diagnosis not present

## 2016-08-07 DIAGNOSIS — M25561 Pain in right knee: Secondary | ICD-10-CM | POA: Diagnosis not present

## 2016-08-07 DIAGNOSIS — M545 Low back pain: Secondary | ICD-10-CM | POA: Diagnosis not present

## 2016-08-07 LAB — IRON,TIBC AND FERRITIN PANEL
%SAT: 18 % (ref 11–50)
Ferritin: 60 ng/mL (ref 20–288)
Iron: 57 ug/dL (ref 45–160)
TIBC: 313 ug/dL (ref 250–450)

## 2016-08-07 MED ORDER — ATORVASTATIN CALCIUM 80 MG PO TABS: 80.0000 mg | ORAL_TABLET | Freq: Every day | ORAL | 1 refills | Status: AC

## 2016-08-11 DIAGNOSIS — M6281 Muscle weakness (generalized): Secondary | ICD-10-CM | POA: Diagnosis not present

## 2016-08-11 DIAGNOSIS — M25561 Pain in right knee: Secondary | ICD-10-CM | POA: Diagnosis not present

## 2016-08-11 DIAGNOSIS — G8929 Other chronic pain: Secondary | ICD-10-CM | POA: Diagnosis not present

## 2016-08-11 DIAGNOSIS — E1142 Type 2 diabetes mellitus with diabetic polyneuropathy: Secondary | ICD-10-CM | POA: Diagnosis not present

## 2016-08-11 DIAGNOSIS — I110 Hypertensive heart disease with heart failure: Secondary | ICD-10-CM | POA: Diagnosis not present

## 2016-08-11 DIAGNOSIS — M545 Low back pain: Secondary | ICD-10-CM | POA: Diagnosis not present

## 2016-08-12 DIAGNOSIS — M25561 Pain in right knee: Secondary | ICD-10-CM | POA: Diagnosis not present

## 2016-08-12 DIAGNOSIS — M6281 Muscle weakness (generalized): Secondary | ICD-10-CM | POA: Diagnosis not present

## 2016-08-12 DIAGNOSIS — M545 Low back pain: Secondary | ICD-10-CM | POA: Diagnosis not present

## 2016-08-12 DIAGNOSIS — G8929 Other chronic pain: Secondary | ICD-10-CM | POA: Diagnosis not present

## 2016-08-12 DIAGNOSIS — E1142 Type 2 diabetes mellitus with diabetic polyneuropathy: Secondary | ICD-10-CM | POA: Diagnosis not present

## 2016-08-12 DIAGNOSIS — I110 Hypertensive heart disease with heart failure: Secondary | ICD-10-CM | POA: Diagnosis not present

## 2016-08-12 DIAGNOSIS — N3941 Urge incontinence: Secondary | ICD-10-CM | POA: Diagnosis not present

## 2016-08-12 DIAGNOSIS — N311 Reflex neuropathic bladder, not elsewhere classified: Secondary | ICD-10-CM | POA: Diagnosis not present

## 2016-08-15 ENCOUNTER — Encounter: Payer: Self-pay | Admitting: Family Medicine

## 2016-08-15 ENCOUNTER — Telehealth: Payer: Self-pay | Admitting: Family Medicine

## 2016-08-15 NOTE — Telephone Encounter (Signed)
LMOM to inform pt to call the office

## 2016-08-15 NOTE — Telephone Encounter (Signed)
Please reach out to patient and encourage her to call

## 2016-08-15 NOTE — Telephone Encounter (Signed)
FYICaren Griffins with [facility] wanted Dr Sanda Klein to know that they have been unsuccessful in contacting the patient. They have tried for 3 days to contact her.  Cynthia's # is [phone].

## 2016-08-21 DIAGNOSIS — M62838 Other muscle spasm: Secondary | ICD-10-CM | POA: Diagnosis not present

## 2016-08-21 DIAGNOSIS — N3941 Urge incontinence: Secondary | ICD-10-CM | POA: Diagnosis not present

## 2016-08-21 DIAGNOSIS — M6281 Muscle weakness (generalized): Secondary | ICD-10-CM | POA: Diagnosis not present

## 2016-08-21 DIAGNOSIS — N393 Stress incontinence (female) (male): Secondary | ICD-10-CM | POA: Diagnosis not present

## 2016-08-21 DIAGNOSIS — R278 Other lack of coordination: Secondary | ICD-10-CM | POA: Diagnosis not present

## 2016-08-22 ENCOUNTER — Ambulatory Visit
Admission: RE | Admit: 2016-08-22 | Discharge: 2016-08-22 | Disposition: A | Discharge status: Home / Self Care | Payer: Medicare Other | Source: Ambulatory Visit | Attending: Family Medicine | Admitting: Family Medicine

## 2016-08-22 ENCOUNTER — Encounter: Payer: Self-pay | Admitting: Family Medicine

## 2016-08-22 ENCOUNTER — Ambulatory Visit (INDEPENDENT_AMBULATORY_CARE_PROVIDER_SITE_OTHER): Payer: Medicare Other | Admitting: Family Medicine

## 2016-08-22 VITALS — BP 124/68 | HR 67 | Temp 98.0°F | Resp 16 | Wt 203.2 lb

## 2016-08-22 DIAGNOSIS — G44329 Chronic post-traumatic headache, not intractable: Secondary | ICD-10-CM | POA: Diagnosis not present

## 2016-08-22 DIAGNOSIS — I6782 Cerebral ischemia: Secondary | ICD-10-CM | POA: Insufficient documentation

## 2016-08-22 DIAGNOSIS — Z794 Long term (current) use of insulin: Secondary | ICD-10-CM | POA: Diagnosis not present

## 2016-08-22 DIAGNOSIS — R482 Apraxia: Secondary | ICD-10-CM

## 2016-08-22 DIAGNOSIS — E1140 Type 2 diabetes mellitus with diabetic neuropathy, unspecified: Secondary | ICD-10-CM

## 2016-08-22 DIAGNOSIS — G529 Cranial nerve disorder, unspecified: Secondary | ICD-10-CM | POA: Insufficient documentation

## 2016-08-22 DIAGNOSIS — R51 Headache: Secondary | ICD-10-CM

## 2016-08-22 DIAGNOSIS — R519 Headache, unspecified: Secondary | ICD-10-CM

## 2016-08-22 DIAGNOSIS — G9389 Other specified disorders of brain: Secondary | ICD-10-CM | POA: Insufficient documentation

## 2016-08-22 DIAGNOSIS — H5704 Mydriasis: Secondary | ICD-10-CM

## 2016-08-22 DIAGNOSIS — I6523 Occlusion and stenosis of bilateral carotid arteries: Secondary | ICD-10-CM

## 2016-08-22 DIAGNOSIS — G8929 Other chronic pain: Secondary | ICD-10-CM

## 2016-08-22 MED ORDER — DULOXETINE HCL 60 MG PO CPEP: 60.0000 mg | ORAL_CAPSULE | Freq: Every day | ORAL | 3 refills | Status: AC

## 2016-08-22 NOTE — Assessment & Plan Note (Signed)
Foot exam by MD today 

## 2016-08-22 NOTE — Patient Instructions (Addendum)
Let's do get her in to see the neurologist on Monday Let's get the MRI of the brain today and talk with a doctor or radiology staff member before you leave the hospital If your symptoms gets worse, go to the ER or call 911

## 2016-08-22 NOTE — Progress Notes (Signed)
BP 124/68   Pulse 67   Temp 98 F (36.7 C) (Oral)   Resp 16   Wt 203 lb 3.2 oz (92.2 kg)   SpO2 96% Comment: 2L  BMI 39.68 kg/m    Subjective:    Patient ID: Bonnie Henry, female    DOB: [birth year], 76 y.o.   MRN: [id number]  HPI: Bonnie Henry is a 76 y.o. female  Chief Complaint  Patient presents with  . Depression    mental status change   HPI Something has changed since last visit in terms of mental status She feels confused and different; she turns around and doesn't know where she is at Daughter has noticed a big change She does not remember where her bedroom is or the bathroom is; feels like she is sitting in the opposite direction from where she is sitting; she will get up and go to the bathroom and goes in the wrong direction; happening at home mostly; also in restaurant once; just left he motel room, and then was at the restaurant and thought they will still at the motel; actually at [place] naming things; trouble talking at times; doesn't come out; jumbled words, blah blah blah; not the right word; occasionally trouble with people's names Asked patient's daughter if her husband was there; he passed away five years ago She thinks there are other people in the [place]; thinks they are there, and forgets where she is; no actual visual or auditory hallucinations Has to really have it explained to here where she is Daughter explained this to her neurologist, Rufina Falco yesterday She has fallen several times; has hit her head several times; most recent fall was a few weeks ago Headache is chronic; 6 out of 10 right now; declined going to the ER  Depression screen [facility] 2/9 08/22/2016 08/06/2016 05/02/2016 01/29/2016 01/15/2016  Decreased Interest 0 0 0 0 0  Down, Depressed, Hopeless 1 1 1  0 1  PHQ - 2 Score 1 1 1  0 1  Altered sleeping - - - - -  Tired, decreased energy - - - - -  Change in appetite - - - - -  Feeling bad or failure about  yourself  - - - - -  Trouble concentrating - - - - -  Moving slowly or fidgety/restless - - - - -  Suicidal thoughts - - - - -  PHQ-9 Score - - - - -  Difficult doing work/chores - - - - -   Relevant past medical, surgical, family and social history reviewed Past Medical History:  Diagnosis Date  . Anemia   . Anxiety   . Arthritis    "joints ache all over" . osteoarthritis  . Asthma   . Atrial fibrillation ([place])   . Breast cancer, left breast (HCC)    S/P mastectomy  . CAD (coronary artery disease)   . CHF (congestive heart failure) ([place])   . Chronic back pain   . Constipation 03/04/2015  . COPD (chronic obstructive pulmonary disease) ([place])   . Depression, major   . Diabetes mellitus without complication ([place])   . DVT (deep venous thrombosis) ([place])   . GERD (gastroesophageal reflux disease)   . Headache   . Heart murmur   . Hepatitis    HEP "C"  . History of blood transfusion    "when I had my mastectomy"  . History of hiatal hernia   . History of right-sided carotid endarterectomy 06/10/2015  . Hypercholesteremia   .  Hypertension   . Morbid obesity ([place])   . Myocardial infarction ([place])   . Neuropathy   . Peripheral vascular disease ([place])   . Post-surgical hypothyroidism   . Restless leg syndrome   . Shortness of breath dyspnea   . Sleep apnea    "2015 wore mask; lost weight; told didn't need mask anymore" (02/23/2014)  . Type II diabetes mellitus ([place])    Past Surgical History:  Procedure Laterality Date  . ARTERY BIOPSY Right 09/29/2014   Procedure: BIOPSY TEMPORAL ARTERY;  Surgeon: Angelia Mould, MD;  Location: [place];  Service: Vascular;  Laterality: Right;  . BARTHOLIN CYST MARSUPIALIZATION    . BREAST BIOPSY Left    Mastectomy  . CARDIAC CATHETERIZATION  2000's  . CAROTID ENDARTERECTOMY Left 2009  . CATARACT EXTRACTION W/ INTRAOCULAR LENS  IMPLANT, BILATERAL Bilateral ~ 2011  . CORONARY ANGIOPLASTY WITH STENT PLACEMENT  1990's X 2   "2; 1"  .  ENDARTERECTOMY Right 05/16/2015   Procedure: ENDARTERECTOMY CAROTID;  Surgeon: Katha Cabal, MD;  Location: [facility];  Service: Vascular;  Laterality: Right;  . ESOPHAGOGASTRODUODENOSCOPY (EGD) WITH PROPOFOL N/A 12/12/2014   Procedure: ESOPHAGOGASTRODUODENOSCOPY (EGD) WITH PROPOFOL;  Surgeon: Lucilla Lame, MD;  Location: [facility];  Service: Endoscopy;  Laterality: N/A;  . EYE SURGERY Bilateral    Catract Extraction with IOL  . HAMMER TOE SURGERY Left    2nd and 3rd digits  . JOINT REPLACEMENT Bilateral    Total Hip Replacement  . JOINT REPLACEMENT Left    Total Knee Replacement  . LEFT HEART CATHETERIZATION WITH CORONARY ANGIOGRAM N/A 02/24/2014   Procedure: LEFT HEART CATHETERIZATION WITH CORONARY ANGIOGRAM;  Surgeon: Leonie Man, MD;  Location: [facility] CATH LAB;  Service: Cardiovascular;  Laterality: N/A;  . MASTECTOMY Left 1990's  . SHOULDER SURGERY Right    "cleaned it out; no scope"  . THYROIDECTOMY, PARTIAL  1950's  . TOE SURGERY Right    "cut piece of bone out so toe didn't dig into my foot"  . TONSILLECTOMY  1950's  . TOTAL HIP ARTHROPLASTY Bilateral 1990's  . TOTAL KNEE ARTHROPLASTY Left 1990's  . TRIGGER FINGER RELEASE Left   . TUBAL LIGATION    . VAGINAL HYSTERECTOMY     "partial"   Family History  Problem Relation Age of Onset  . Arthritis Mother   . Cancer Mother   . Diabetes Mother   . Heart disease Mother   . Hyperlipidemia Mother   . Hypertension Mother   . Alcohol abuse Father   . Brain cancer Father   . Alcohol abuse Brother   . Arthritis Brother   . Asthma Brother   . HIV Brother   . Cancer Brother   . Diabetes Brother   . Hyperlipidemia Brother   . Hypertension Brother   . Lung disease Brother   . Arthritis Maternal Grandmother    Social History   Social History  . Marital status: Widowed    Spouse name: N/A  . Number of children: N/A  . Years of education: N/A   Occupational History  . Not on file.   Social History Main Topics  .  Smoking status: Former Smoker    Packs/day: 1.50    Years: 44.00    Types: Cigarettes    Quit date: 01/06/1994  . Smokeless tobacco: Never Used     Comment: "quit smoking cigarettes in ~ 2000"  . Alcohol use No     Comment: 02/23/2014 "no alcohol since 1990's"  .  Drug use: No  . Sexual activity: No   Other Topics Concern  . Not on file   Social History Narrative   Lives with daughter in a mobile home.  Has 4 children alive, 1 deceased.  Retired from [employer].  Education: high school.    Interim medical history since last visit reviewed. Allergies and medications reviewed  Review of Systems Per HPI unless specifically indicated above     Objective:    BP 124/68   Pulse 67   Temp 98 F (36.7 C) (Oral)   Resp 16   Wt 203 lb 3.2 oz (92.2 kg)   SpO2 96% Comment: 2L  BMI 39.68 kg/m   Wt Readings from Last 3 Encounters:  08/22/16 203 lb 3.2 oz (92.2 kg)  08/06/16 206 lb 9.6 oz (93.7 kg)  07/23/16 206 lb (93.4 kg)    Physical Exam  Constitutional: She is oriented to person, place, and time.  Obese, elderly female seated in wheelchair; gait not assessed  HENT:  Nose: No rhinorrhea.  Mouth/Throat: Oropharynx is clear and moist and mucous membranes are normal.  Wearing supplemental oxygen via nasal cannula; scalp is tender to the touch; no visible erythema  Cardiovascular: Normal rate and regular rhythm.   Pulmonary/Chest: Effort normal. She has decreased breath sounds.  Neurological: She is alert and oriented to person, place, and time. She is not disoriented. A cranial nerve deficit is present.  Left pupil is fixed and dilated  Skin: Ecchymosis noted. No pallor.  Psychiatric: She has a normal mood and affect.   Diabetic Foot Form - Detailed   Diabetic Foot Exam - detailed Right Dorsalis Pedis:  Present Left Dorsalis Pedis:  Present  Semmes-Weinstein Monofilament Test R Site 1-Great Toe:  Pos L Site 1-Great Toe:  Pos        Results for orders placed or performed in  visit on 08/06/16  CBC  Result Value Ref Range   WBC 5.4 3.8 - 10.8 K/uL   RBC 3.53 (L) 3.80 - 5.10 MIL/uL   Hemoglobin 10.4 (L) 11.7 - 15.5 g/dL   HCT 32.3 (L) 35.0 - 45.0 %   MCV 91.5 80.0 - 100.0 fL   MCH 29.5 27.0 - 33.0 pg   MCHC 32.2 32.0 - 36.0 g/dL   RDW 15.1 (H) 11.0 - 15.0 %   Platelets 168 140 - 400 K/uL   MPV 10.6 7.5 - 12.5 fL  TSH  Result Value Ref Range   TSH 2.14 mIU/L  Fe+TIBC+Fer  Result Value Ref Range   Ferritin 60 20 - 288 ng/mL   Iron 57 45 - 160 ug/dL   TIBC 313 250 - 450 ug/dL   %SAT 18 11 - 50 %  ALT  Result Value Ref Range   ALT 20 6 - 29 U/L  Lipid panel  Result Value Ref Range   Cholesterol 181 <200 mg/dL   Triglycerides 148 <150 mg/dL   HDL 69 >50 mg/dL   Total CHOL/HDL Ratio 2.6 <5.0 Ratio   VLDL 30 <30 mg/dL   LDL Cholesterol 82 <100 mg/dL      Assessment & Plan:   Problem List Items Addressed This Visit      Endocrine   Diabetes mellitus with diabetic neuropathy ([place])    Foot exam by MD today       Other Visit Diagnoses    Speech apraxia    -  Primary   I believe this is the correct term for what she's experiencing; I  will get brain MRA/MRI; refer to neuro; patient will see neuro on Monday; to ER/call 911 prn   Relevant Orders   MR Brain Wo Contrast   MR Angiogram Head Wo Contrast   Dilatation pupillary       not sure if true new CN deficit or sequela from previous cataract surgery; regardless, she refuses to go to ER; will get STAT MRI/MRA brain; to ER/call 911 PRN   Relevant Orders   MR Brain Wo Contrast   MR Angiogram Head Wo Contrast   Cranial nerve disorder       not sure if pupillary finding new, sequela of cataract surgery or true new CN deficit; will get STAT brain MRI/MRA, to neuro on Monday; refused ER today   Relevant Orders   MR Brain Wo Contrast   MR Angiogram Head Wo Contrast   Chronic post-traumatic headache, not intractable       Relevant Medications   DULoxetine (CYMBALTA) 60 MG capsule   Other Relevant  Orders   MR Brain Wo Contrast   MR Angiogram Head Wo Contrast   Chronic nonintractable headache, unspecified headache type       Relevant Medications   DULoxetine (CYMBALTA) 60 MG capsule   Other Relevant Orders   MR Angiogram Head Wo Contrast       Follow up plan: No Follow-up on file.  An after-visit summary was printed and given to the patient at [place].  Please see the patient instructions which may contain other information and recommendations beyond what is mentioned above in the assessment and plan.  Meds ordered this encounter  Medications  . mirabegron ER (MYRBETRIQ) 25 MG TB24 tablet    Sig: Take 25 mg by mouth daily.  . DULoxetine (CYMBALTA) 60 MG capsule    Sig: Take 1 capsule (60 mg total) by mouth daily.    Dispense:  30 capsule    Refill:  3    Orders Placed This Encounter  Procedures  . MR Brain Wo Contrast  . MR Angiogram Head Wo Contrast

## 2016-08-25 ENCOUNTER — Ambulatory Visit (INDEPENDENT_AMBULATORY_CARE_PROVIDER_SITE_OTHER): Payer: Medicare Other | Admitting: Neurology

## 2016-08-25 ENCOUNTER — Encounter: Payer: Self-pay | Admitting: Neurology

## 2016-08-25 VITALS — BP 144/72 | HR 74 | Ht 59.0 in | Wt 204.4 lb

## 2016-08-25 DIAGNOSIS — I6523 Occlusion and stenosis of bilateral carotid arteries: Secondary | ICD-10-CM | POA: Diagnosis not present

## 2016-08-25 DIAGNOSIS — I6521 Occlusion and stenosis of right carotid artery: Secondary | ICD-10-CM | POA: Diagnosis not present

## 2016-08-25 DIAGNOSIS — E0842 Diabetes mellitus due to underlying condition with diabetic polyneuropathy: Secondary | ICD-10-CM | POA: Diagnosis not present

## 2016-08-25 DIAGNOSIS — R4189 Other symptoms and signs involving cognitive functions and awareness: Secondary | ICD-10-CM | POA: Diagnosis not present

## 2016-08-25 DIAGNOSIS — R2689 Other abnormalities of gait and mobility: Secondary | ICD-10-CM

## 2016-08-25 NOTE — Patient Instructions (Signed)
-   continue ASA and lipitor for stroke prevention - continue follow up with pulmonary regarding COPD control and steroids use - check BP and glucose at home and record - Follow up with your primary care physician for stroke risk factor modification. Recommend maintain blood pressure goal <130/80, diabetes with hemoglobin A1c goal below 7.0% and lipids with LDL cholesterol goal below 70 mg/dL.  - will refer to neuropsychological testing  - avoid over exertion, stress, infection to avoid lower brain function.  - avoid fall as possible, use walker if need to walk, avoid standing up too fast and stand for a while before start to walk.  - follow up in 3 months.

## 2016-08-26 ENCOUNTER — Ambulatory Visit
Admission: RE | Admit: 2016-08-26 | Discharge: 2016-08-26 | Disposition: A | Discharge status: Home / Self Care | Payer: Medicare Other | Source: Ambulatory Visit | Attending: Specialist | Admitting: Specialist

## 2016-08-26 DIAGNOSIS — R2689 Other abnormalities of gait and mobility: Secondary | ICD-10-CM | POA: Insufficient documentation

## 2016-08-26 DIAGNOSIS — Z9981 Dependence on supplemental oxygen: Secondary | ICD-10-CM | POA: Insufficient documentation

## 2016-08-26 DIAGNOSIS — E0842 Diabetes mellitus due to underlying condition with diabetic polyneuropathy: Secondary | ICD-10-CM | POA: Insufficient documentation

## 2016-08-26 DIAGNOSIS — G4710 Hypersomnia, unspecified: Secondary | ICD-10-CM | POA: Diagnosis not present

## 2016-08-26 DIAGNOSIS — J449 Chronic obstructive pulmonary disease, unspecified: Secondary | ICD-10-CM | POA: Insufficient documentation

## 2016-08-26 DIAGNOSIS — R4189 Other symptoms and signs involving cognitive functions and awareness: Secondary | ICD-10-CM | POA: Insufficient documentation

## 2016-08-26 DIAGNOSIS — R918 Other nonspecific abnormal finding of lung field: Secondary | ICD-10-CM | POA: Diagnosis not present

## 2016-08-26 DIAGNOSIS — R0609 Other forms of dyspnea: Secondary | ICD-10-CM | POA: Diagnosis not present

## 2016-08-26 LAB — BLOOD GAS, ARTERIAL
Acid-Base Excess: 10.2 mmol/L — ABNORMAL HIGH (ref 0.0–2.0)
Bicarbonate: 36 mmol/L — ABNORMAL HIGH (ref 20.0–28.0)
FIO2: 0.28
O2 Saturation: 97 %
PCO2 ART: 53 mmHg — AB (ref 32.0–48.0)
PH ART: 7.44 (ref 7.350–7.450)
PO2 ART: 87 mmHg (ref 83.0–108.0)
Patient temperature: 37

## 2016-08-26 NOTE — Progress Notes (Signed)
NEUROLOGY CLINIC NEW PATIENT NOTE  NAME: Bonnie Henry DOB: October 23, 1940  I saw Bonnie Henry as a new patient in the neurovascular clinic today regarding  Chief Complaint  Patient presents with  . New Patient (Initial Visit)    Referral from Dr Enid Derry for headaches, memory loss, confusion, speech for the past 5 years, saw Dr .Posey Pronto at [facility] neurology in 2017 request to not see Dr. Posey Pronto , pt on 2 liters of oxygen  .  HPI: Bonnie Henry is a 76 y.o. female with PMH of diabetes, COPD on oxygen, left breast cancer s/p mastectomy, severe RICA stenosis s/p CEA, hyperlipidemia, morbid obesity, memory loss and depression  who presents as a new patient for Frequent falls, intermittent disorientation and speech difficulty. patient is continent by her daughter.  Patient stated that since this year, she had about 15 falls. Sometimes there are mechanical falls, tripping on things, or turning too fast; sometimes when she is bending over to pick up things on the floor and she fell forward; sometimes she stands up fast with lightheadedness or just started walking and she is down; and sometimes she had right knee pain and her knees give out. She denies any leg weakness, loss of consciousness, or hitting her head. She is in wheelchair today with her daughter in office.  Patient and daughter reported intermittent disorientation about the place. She goes to bathroom, and comes out does not knowing where she was; sometimes goes to the wrong direction; asking to go home although he is right at home. Also has intermittent garbled speech, speech does not make sense or intelligible words. This episodes are more frequently having in the evenings, or she is tired, or more prominent at times she had severe cold several months ago.  She has been following with Dr. Posey Pronto at [facility] for memory loss. MRI showed moderate WM changes and atrophy. Has declined neuropsychologic testing in the past. She  still on prednisone for COPD treatment and her glucose has been difficult for control.  She had a sleep study 05/2016 showed very mild OSA, currently not on CPAP, she stated that she can not have good sleep every day.  Past Medical History:  Diagnosis Date  . Anemia   . Anxiety   . Arthritis    "joints ache all over" . osteoarthritis  . Asthma   . Atrial fibrillation ([place])   . Breast cancer, left breast (HCC)    S/P mastectomy  . CAD (coronary artery disease)   . CHF (congestive heart failure) ([place])   . Chronic back pain   . Constipation 03/04/2015  . COPD (chronic obstructive pulmonary disease) ([place])   . Depression, major   . Diabetes mellitus without complication ([place])   . DVT (deep venous thrombosis) ([place])   . GERD (gastroesophageal reflux disease)   . Headache   . Heart murmur   . Hepatitis    HEP "C"  . History of blood transfusion    "when I had my mastectomy"  . History of hiatal hernia   . History of right-sided carotid endarterectomy 06/10/2015  . Hypercholesteremia   . Hypertension   . Morbid obesity ([place])   . Myocardial infarction ([place])   . Neuropathy   . Peripheral vascular disease ([place])   . Post-surgical hypothyroidism   . Restless leg syndrome   . Shortness of breath dyspnea   . Sleep apnea    "2015 wore mask; lost weight; told didn't need mask anymore" (02/23/2014)  .  Type II diabetes mellitus ([place])    Past Surgical History:  Procedure Laterality Date  . ARTERY BIOPSY Right 09/29/2014   Procedure: BIOPSY TEMPORAL ARTERY;  Surgeon: Angelia Mould, MD;  Location: [place];  Service: Vascular;  Laterality: Right;  . BARTHOLIN CYST MARSUPIALIZATION    . BREAST BIOPSY Left    Mastectomy  . CARDIAC CATHETERIZATION  2000's  . CAROTID ENDARTERECTOMY Left 2009  . CATARACT EXTRACTION W/ INTRAOCULAR LENS  IMPLANT, BILATERAL Bilateral ~ 2011  . CORONARY ANGIOPLASTY WITH STENT PLACEMENT  1990's X 2   "2; 1"  . ENDARTERECTOMY Right 05/16/2015   Procedure:  ENDARTERECTOMY CAROTID;  Surgeon: Katha Cabal, MD;  Location: [facility];  Service: Vascular;  Laterality: Right;  . ESOPHAGOGASTRODUODENOSCOPY (EGD) WITH PROPOFOL N/A 12/12/2014   Procedure: ESOPHAGOGASTRODUODENOSCOPY (EGD) WITH PROPOFOL;  Surgeon: Lucilla Lame, MD;  Location: [facility] ENDOSCOPY;  Service: Endoscopy;  Laterality: N/A;  . EYE SURGERY Bilateral    Catract Extraction with IOL  . HAMMER TOE SURGERY Left    2nd and 3rd digits  . JOINT REPLACEMENT Bilateral    Total Hip Replacement  . JOINT REPLACEMENT Left    Total Knee Replacement  . LEFT HEART CATHETERIZATION WITH CORONARY ANGIOGRAM N/A 02/24/2014   Procedure: LEFT HEART CATHETERIZATION WITH CORONARY ANGIOGRAM;  Surgeon: Leonie Man, MD;  Location: [facility] CATH LAB;  Service: Cardiovascular;  Laterality: N/A;  . MASTECTOMY Left 1990's  . SHOULDER SURGERY Right    "cleaned it out; no scope"  . THYROIDECTOMY, PARTIAL  1950's  . TOE SURGERY Right    "cut piece of bone out so toe didn't dig into my foot"  . TONSILLECTOMY  1950's  . TOTAL HIP ARTHROPLASTY Bilateral 1990's  . TOTAL KNEE ARTHROPLASTY Left 1990's  . TRIGGER FINGER RELEASE Left   . TUBAL LIGATION    . VAGINAL HYSTERECTOMY     "partial"   Family History  Problem Relation Age of Onset  . Arthritis Mother   . Cancer Mother   . Diabetes Mother   . Heart disease Mother   . Hyperlipidemia Mother   . Hypertension Mother   . Alcohol abuse Father   . Brain cancer Father   . Alcohol abuse Brother   . Arthritis Brother   . Asthma Brother   . HIV Brother   . Cancer Brother   . Diabetes Brother   . Hyperlipidemia Brother   . Hypertension Brother   . Lung disease Brother   . Arthritis Maternal Grandmother    Current Outpatient Prescriptions  Medication Sig Dispense Refill  . acetaminophen (TYLENOL) 500 MG tablet Take 2 tablets (1,000 mg total) by mouth every 6 (six) hours as needed for moderate pain. 30 tablet 0  . albuterol (ACCUNEB) 0.63 MG/3ML nebulizer  solution Take 1 ampule by nebulization every 6 (six) hours as needed for wheezing or shortness of breath.     Marland Kitchen albuterol (PROAIR HFA) 108 (90 BASE) MCG/ACT inhaler Inhale 2 puffs into the lungs every 4 (four) hours as needed for wheezing or shortness of breath.     . Ascorbic Acid (VITAMIN C PO) Take 1 tablet by mouth 3 (three) times a week.     Marland Kitchen aspirin EC 81 MG tablet Take 81 mg by mouth daily.    Marland Kitchen atorvastatin (LIPITOR) 80 MG tablet Take 1 tablet (80 mg total) by mouth at bedtime. 90 tablet 1  . budesonide (PULMICORT) 0.5 MG/2ML nebulizer solution Take 0.5 mg by nebulization 2 (two) times daily.     Marland Kitchen  bumetanide (BUMEX) 0.5 MG tablet Take 0.5 mg by mouth as needed.     . busPIRone (BUSPAR) 15 MG tablet TAKE ONE TABLET BY MOUTH TWICE DAILY. 180 tablet 3  . carvedilol (COREG) 12.5 MG tablet Take 12.5 mg by mouth 2 (two) times daily with a meal.     . DULoxetine (CYMBALTA) 60 MG capsule Take 1 capsule (60 mg total) by mouth daily. 30 capsule 3  . fluticasone (FLONASE) 50 MCG/ACT nasal spray Place 2 sprays into both nostrils as needed for allergies.     . formoterol (PERFOROMIST) 20 MCG/2ML nebulizer solution Inhale 20 mcg into the lungs 2 (two) times daily.     Marland Kitchen gabapentin (NEURONTIN) 300 MG capsule TAKE ONE CAPSULE BY MOUTH TWICE DAILY (Patient taking differently: TAKE 600 mg BY MOUTH TWICE DAILY) 60 capsule 6  . Hypromellose (ARTIFICIAL TEARS OP) Place 1 drop into both eyes as needed (dry eyes).     . insulin NPH Human (HUMULIN N,NOVOLIN N) 100 UNIT/ML injection Inject 24 Units into the skin at bedtime.     . insulin regular (NOVOLIN R,HUMULIN R) 100 units/mL injection Inject 3-20 Units into the skin 3 (three) times daily before meals. Based on sliding scale     . levothyroxine (SYNTHROID, LEVOTHROID) 100 MCG tablet Take 1 tablet (100 mcg total) by mouth every other day. (alternate with the 112 mcg strength) (Patient taking differently: Take 100 mcg by mouth See admin instructions. Take 1  tablet (100 mcg) by mouth before breakfast Monday thru Thursday (take 112 mcg other days)) 15 tablet 2  . levothyroxine (SYNTHROID, LEVOTHROID) 112 MCG tablet Take 1 tablet (112 mcg total) by mouth every other day. (alternate with the 100 mcg strength) (Patient taking differently: Take 112 mcg by mouth See admin instructions. Take 1 tablet (112 mcg) by mouth before breakfast on Friday, Saturday, Sunday (take 100 mcg other days)) 15 tablet 2  . meclizine (ANTIVERT) 25 MG tablet Take 0.5-1 tablets (12.5-25 mg total) by mouth 3 (three) times daily as needed for dizziness. 30 tablet 0  . mirabegron ER (MYRBETRIQ) 25 MG TB24 tablet Take 25 mg by mouth daily.    . montelukast (SINGULAIR) 10 MG tablet Take 1 tablet (10 mg total) by mouth daily. 30 tablet 11  . nitroGLYCERIN (NITROSTAT) 0.4 MG SL tablet Place 0.4 mg under the tongue every 5 (five) minutes as needed for chest pain.    . Omega-3 Fatty Acids (FISH OIL) 1200 MG CAPS Take 1 capsule by mouth 2 (two) times a week.     . OXYGEN Inhale 2 L into the lungs continuous.    . pantoprazole (PROTONIX) 20 MG tablet TAKE ONE TABLET BY MOUTH TWICE DAILY 180 tablet 1  . predniSONE (DELTASONE) 10 MG tablet Take 10 mg by mouth 3 (three) times a week.     . ranitidine (ZANTAC) 300 MG tablet Take 1 tablet (300 mg total) by mouth at bedtime. 30 tablet 11  . rOPINIRole (REQUIP) 1 MG tablet Take 1 tablet (1 mg total) by mouth 3 (three) times daily. 90 tablet 6  . spironolactone (ALDACTONE) 25 MG tablet Take 25 mg by mouth daily.    Marland Kitchen tiZANidine (ZANAFLEX) 2 MG tablet TAKE ONE TABLET BY MOUTH AT BEDTIME 30 tablet 5   No current facility-administered medications for this visit.    Allergies  Allergen Reactions  . Vasotec [Enalapril] Swelling    Throat swells  . Codeine Nausea And Vomiting  . Morphine And Related Nausea And Vomiting  Social History   Social History  . Marital status: Widowed    Spouse name: N/A  . Number of children: N/A  . Years of  education: N/A   Occupational History  . Not on file.   Social History Main Topics  . Smoking status: Former Smoker    Packs/day: 1.50    Years: 44.00    Types: Cigarettes    Quit date: 01/06/1994  . Smokeless tobacco: Never Used     Comment: "quit smoking cigarettes in ~ 2000"  . Alcohol use No     Comment: 02/23/2014 "no alcohol since 1990's"  . Drug use: No  . Sexual activity: No   Other Topics Concern  . Not on file   Social History Narrative   Lives with daughter in a mobile home.  Has 4 children alive, 1 deceased.  Retired from [employer].  Education: high school.    Review of Systems Full 14 system review of systems performed and notable only for those listed, all others are neg:  Constitutional:  Weight gain, fatigue Cardiovascular: Murmur Ear/Nose/Throat:  Hearing loss, spinning sensation, trouble swallowing Skin:  Eyes:   Respiratory:  SOB, wheezing, snoring Gastroitestinal:  Incontinence Genitourinary: Incontinence, urination problems Hematology/Lymphatic:  Easy bruising Endocrine:  Musculoskeletal:  Joint pain, joint swelling, aching muscles Allergy/Immunology:  Runny nose, skin sensitivity Neurological:  Memory loss, confusion, headache, weakness, slurred speech, dizziness Psychiatric: Depression, anxiety Sleep: Snoring, restless legs   Physical Exam  Vitals:   08/25/16 1321  BP: (!) 144/72  Pulse: 74    General - Morbid obesity, well developed, on home oxygen with mild respiratory distress.  Ophthalmologic - fundi not visualized due to eye movement.  Cardiovascular - Regular rate and rhythm with no murmur.   Neck - supple, no nuchal rigidity.  Mental Status -  Level of arousal and orientation to time, place, and person were intact. Language including expression, naming, repetition, comprehension, reading, and writing was assessed and found intact. Fund of Knowledge was assessed and was intact.  Cranial Nerves II - XII - II - Visual field  intact OU. III, IV, VI - Extraocular movements intact. V - Facial sensation intact bilaterally. VII - Facial movement intact bilaterally. VIII - Hearing & vestibular intact bilaterally. X - Palate elevates symmetrically. XI - Chin turning & shoulder shrug intact bilaterally. XII - Tongue protrusion intact.  Motor Strength - The patient's strength was 5/5 BUEs, 4/5 BLEs and pronator drift was absent.  Bulk was normal and fasciculations were absent.   Motor Tone - Muscle tone was assessed at the neck and appendages and was normal.  Reflexes - The patient's reflexes were normal in all extremities and she had no pathological reflexes.  Sensory - Light touch, temperature/pinprick were assessed and were normal    Coordination - The patient had normal movements in the hands with no ataxia or dysmetria.  Tremor was absent.  Gait and Station - in wheelchair, not tested.   Imaging  I have personally reviewed the radiological images below and agree with the radiology interpretations.  MRI cervical spine wo contrast 02/20/2015: 1. Cervical spondylosis and degenerative disc disease, causing mild impingement at the C4-5, C5-6, and C6-7 levels, as detailed above. 2. Suspected mild chronic ischemic microvascular white matter disease along the posterior pons, not changed from prior.  MRI brain wo contrast 04/25/2015: 1. No acute or focal abnormality to explain the patient's symptoms. 2. Moderate generalized atrophy and moderate to severe diffuse white matter disease. This likely reflects  the sequela of chronic microvascular ischemia. 3. Stable exophthalmos.  Mri and MRA Head Wo Contrast 08/22/2016 IMPRESSION: Brain MRI: 1. No acute finding or change from 04/25/2015. 2. Extensive chronic small vessel ischemia in the cerebral white matter. 3. Atrophy and ventriculomegaly. Intracranial MRA: Negative.   TTE 02/2014 Left ventricle: The cavity size was normal. Wall thickness was increased in a  pattern of mild LVH. The estimated ejection fraction was 60%. Regional wall motion abnormalities cannot be excluded. Doppler parameters are consistent with high ventricular filling pressure. - Aortic valve: Valve is poorly seen. There is some thickening. I suspect mild AS considering all data. Trace AI. - Left atrium: The atrium was mildly dilated. - Right ventricle: The cavity size was normal. Systolic function was normal.  05/2016 sleep study - very mild OSA, AHI only 6.1  Lab Review Component     Latest Ref Rng & Units 05/29/2016 07/06/2016 08/06/2016  Cholesterol     <200 mg/dL 183  181  Triglycerides     <150 mg/dL 153 (H)  148  HDL Cholesterol     >50 mg/dL 63  69  Total CHOL/HDL Ratio     <5.0 Ratio 2.9  2.6  VLDL     <30 mg/dL 31 (H)  30  LDL (calc)     <100 mg/dL 89  82  Hemoglobin A1C     4.8 - 5.6 %  7.6 (H)   Mean Plasma Glucose     mg/dL  171   TSH     mIU/L   2.14     Assessment and Plan:   In summary, Bonnie Henry is a 76 y.o. female with PMH of  presents diabetes, COPD on oxygen, left breast cancer s/p mastectomy, severe RICA stenosis s/p CEA, hyperlipidemia, morbid obesity, memory loss and depression  who presents as a new patient for Frequent falls, intermittent disorientation and speech difficulty. Her falls are related to mechanical falls or getting up too fast. Her lose of place orientation and dysarthria are related to tiredness/physical condition decline. MRI showed brain atrophy and WM changes. 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 Pt agreed about neuropsych testing this time.    - continue ASA and lipitor for stroke prevention - continue follow up with pulmonary regarding COPD control and steroids use - check BP and glucose at home and record - Follow up with your primary care physician for stroke risk factor modification. Recommend maintain blood pressure goal <130/80, diabetes with hemoglobin A1c goal below  7.0% and lipids with LDL cholesterol goal below 70 mg/dL.  - will refer to neuropsychological testing  - avoid over exertion, stress, infection to avoid lower brain function.  - avoid fall as possible, use walker if need to walk, avoid standing up too fast and stand for a while before start to walk.  - follow up in 3 months.   Thank you very much for the opportunity to participate in the care of this patient.  Please do not hesitate to call if any questions or concerns arise.  No orders of the defined types were placed in this encounter.   No orders of the defined types were placed in this encounter.   Patient Instructions  - continue ASA and lipitor for stroke prevention - continue follow up with pulmonary regarding COPD control and steroids use - check BP and glucose at home and record - Follow up with your primary care physician for stroke risk factor modification. Recommend maintain blood  pressure goal <130/80, diabetes with hemoglobin A1c goal below 7.0% and lipids with LDL cholesterol goal below 70 mg/dL.  - will refer to neuropsychological testing  - avoid over exertion, stress, infection to avoid lower brain function.  - avoid fall as possible, use walker if need to walk, avoid standing up too fast and stand for a while before start to walk.  - follow up in 3 months.    Rosalin Hawking, MD PhD [name] [address] [place], [place] [address] [phone]

## 2016-08-27 ENCOUNTER — Other Ambulatory Visit (HOSPITAL_COMMUNITY): Payer: Self-pay | Admitting: Specialist

## 2016-08-27 DIAGNOSIS — R0609 Other forms of dyspnea: Principal | ICD-10-CM

## 2016-09-01 ENCOUNTER — Telehealth: Payer: Self-pay | Admitting: Family Medicine

## 2016-09-01 DIAGNOSIS — R2681 Unsteadiness on feet: Secondary | ICD-10-CM

## 2016-09-01 DIAGNOSIS — R079 Chest pain, unspecified: Secondary | ICD-10-CM | POA: Diagnosis not present

## 2016-09-01 DIAGNOSIS — G4733 Obstructive sleep apnea (adult) (pediatric): Secondary | ICD-10-CM | POA: Diagnosis not present

## 2016-09-01 DIAGNOSIS — Z794 Long term (current) use of insulin: Secondary | ICD-10-CM | POA: Diagnosis not present

## 2016-09-01 DIAGNOSIS — I10 Essential (primary) hypertension: Secondary | ICD-10-CM | POA: Diagnosis not present

## 2016-09-01 DIAGNOSIS — E119 Type 2 diabetes mellitus without complications: Secondary | ICD-10-CM | POA: Diagnosis not present

## 2016-09-01 NOTE — Telephone Encounter (Signed)
Patient is home-bound in the sense that it is hard for her to get out, daughter has to accompany her; she is a fall risk; does not drive Working with [facility] to recert her therapy at home Needs skilled nursing to help with medicines Also needs day time assistance when daughter not there

## 2016-09-02 DIAGNOSIS — L600 Ingrowing nail: Secondary | ICD-10-CM | POA: Diagnosis not present

## 2016-09-02 DIAGNOSIS — M79674 Pain in right toe(s): Secondary | ICD-10-CM | POA: Diagnosis not present

## 2016-09-03 DIAGNOSIS — M62838 Other muscle spasm: Secondary | ICD-10-CM | POA: Diagnosis not present

## 2016-09-03 DIAGNOSIS — K5900 Constipation, unspecified: Secondary | ICD-10-CM | POA: Diagnosis not present

## 2016-09-03 DIAGNOSIS — N393 Stress incontinence (female) (male): Secondary | ICD-10-CM | POA: Diagnosis not present

## 2016-09-03 DIAGNOSIS — M6281 Muscle weakness (generalized): Secondary | ICD-10-CM | POA: Diagnosis not present

## 2016-09-04 ENCOUNTER — Telehealth: Payer: Self-pay | Admitting: Family Medicine

## 2016-09-04 ENCOUNTER — Ambulatory Visit: Payer: Self-pay | Admitting: Family Medicine

## 2016-09-04 DIAGNOSIS — E119 Type 2 diabetes mellitus without complications: Secondary | ICD-10-CM | POA: Diagnosis not present

## 2016-09-04 NOTE — Telephone Encounter (Signed)
Patient is status post unilateral mastectomy for breast cancer I do not see that she has had breast imaging any time recently I'm canceling the bilateral screening mammo Please call her, encourage her to have remaining breast imaged and order please if she agrees Thank you

## 2016-09-05 ENCOUNTER — Other Ambulatory Visit: Payer: Self-pay

## 2016-09-05 DIAGNOSIS — Z1239 Encounter for other screening for malignant neoplasm of breast: Secondary | ICD-10-CM

## 2016-09-05 DIAGNOSIS — Z853 Personal history of malignant neoplasm of breast: Secondary | ICD-10-CM

## 2016-09-05 NOTE — Telephone Encounter (Signed)
Patient daughter notified will order

## 2016-09-06 ENCOUNTER — Other Ambulatory Visit: Payer: Self-pay | Admitting: Family Medicine

## 2016-09-09 DIAGNOSIS — L03031 Cellulitis of right toe: Secondary | ICD-10-CM | POA: Diagnosis not present

## 2016-09-10 DIAGNOSIS — J449 Chronic obstructive pulmonary disease, unspecified: Secondary | ICD-10-CM | POA: Diagnosis not present

## 2016-09-12 ENCOUNTER — Other Ambulatory Visit: Payer: Self-pay

## 2016-09-12 DIAGNOSIS — Z87891 Personal history of nicotine dependence: Secondary | ICD-10-CM | POA: Diagnosis not present

## 2016-09-12 DIAGNOSIS — G2581 Restless legs syndrome: Secondary | ICD-10-CM | POA: Diagnosis not present

## 2016-09-12 DIAGNOSIS — E1151 Type 2 diabetes mellitus with diabetic peripheral angiopathy without gangrene: Secondary | ICD-10-CM | POA: Diagnosis not present

## 2016-09-12 DIAGNOSIS — M25562 Pain in left knee: Secondary | ICD-10-CM | POA: Diagnosis not present

## 2016-09-12 DIAGNOSIS — J9611 Chronic respiratory failure with hypoxia: Secondary | ICD-10-CM | POA: Diagnosis not present

## 2016-09-12 DIAGNOSIS — I110 Hypertensive heart disease with heart failure: Secondary | ICD-10-CM | POA: Diagnosis not present

## 2016-09-12 DIAGNOSIS — I509 Heart failure, unspecified: Secondary | ICD-10-CM | POA: Diagnosis not present

## 2016-09-12 DIAGNOSIS — G4730 Sleep apnea, unspecified: Secondary | ICD-10-CM | POA: Diagnosis not present

## 2016-09-12 DIAGNOSIS — D509 Iron deficiency anemia, unspecified: Secondary | ICD-10-CM | POA: Diagnosis not present

## 2016-09-12 DIAGNOSIS — M25561 Pain in right knee: Secondary | ICD-10-CM | POA: Diagnosis not present

## 2016-09-12 DIAGNOSIS — Z853 Personal history of malignant neoplasm of breast: Secondary | ICD-10-CM | POA: Diagnosis not present

## 2016-09-12 DIAGNOSIS — Z794 Long term (current) use of insulin: Secondary | ICD-10-CM | POA: Diagnosis not present

## 2016-09-12 DIAGNOSIS — E1142 Type 2 diabetes mellitus with diabetic polyneuropathy: Secondary | ICD-10-CM | POA: Diagnosis not present

## 2016-09-12 DIAGNOSIS — F329 Major depressive disorder, single episode, unspecified: Secondary | ICD-10-CM | POA: Diagnosis not present

## 2016-09-12 DIAGNOSIS — G8929 Other chronic pain: Secondary | ICD-10-CM | POA: Diagnosis not present

## 2016-09-12 DIAGNOSIS — R2689 Other abnormalities of gait and mobility: Secondary | ICD-10-CM

## 2016-09-12 DIAGNOSIS — F419 Anxiety disorder, unspecified: Secondary | ICD-10-CM | POA: Diagnosis not present

## 2016-09-12 DIAGNOSIS — Z86718 Personal history of other venous thrombosis and embolism: Secondary | ICD-10-CM | POA: Diagnosis not present

## 2016-09-12 DIAGNOSIS — I480 Paroxysmal atrial fibrillation: Secondary | ICD-10-CM | POA: Diagnosis not present

## 2016-09-12 DIAGNOSIS — J439 Emphysema, unspecified: Secondary | ICD-10-CM | POA: Diagnosis not present

## 2016-09-12 DIAGNOSIS — R471 Dysarthria and anarthria: Secondary | ICD-10-CM | POA: Diagnosis not present

## 2016-09-12 DIAGNOSIS — I2510 Atherosclerotic heart disease of native coronary artery without angina pectoris: Secondary | ICD-10-CM | POA: Diagnosis not present

## 2016-09-12 MED ORDER — ROPINIROLE HCL 1 MG PO TABS: 1.0000 mg | ORAL_TABLET | Freq: Three times a day (TID) | ORAL | 1 refills | Status: DC

## 2016-09-12 NOTE — Telephone Encounter (Signed)
Pt seen by Dr. Sanda Klein, PCP 08/22/2016 - refill provided.

## 2016-09-15 ENCOUNTER — Telehealth: Payer: Self-pay | Admitting: Neurology

## 2016-09-15 ENCOUNTER — Other Ambulatory Visit: Payer: Self-pay | Admitting: Neurology

## 2016-09-15 DIAGNOSIS — R4189 Other symptoms and signs involving cognitive functions and awareness: Secondary | ICD-10-CM

## 2016-09-15 DIAGNOSIS — J449 Chronic obstructive pulmonary disease, unspecified: Secondary | ICD-10-CM | POA: Diagnosis not present

## 2016-09-15 DIAGNOSIS — R32 Unspecified urinary incontinence: Secondary | ICD-10-CM | POA: Diagnosis not present

## 2016-09-15 DIAGNOSIS — R350 Frequency of micturition: Secondary | ICD-10-CM | POA: Diagnosis not present

## 2016-09-15 DIAGNOSIS — R3915 Urgency of urination: Secondary | ICD-10-CM | POA: Diagnosis not present

## 2016-09-15 NOTE — Telephone Encounter (Signed)
Please let the daughter know that I have put in the order for neuropsych testing to be done at [facility] neurology with Dr. Macarthur Critchley. They will contact her for appointment soon. Thanks.   Rosalin Hawking, MD PhD Stroke Neurology 09/15/2016 12:35 PM

## 2016-09-15 NOTE — Telephone Encounter (Signed)
Message sent to Dr. Erlinda Hong about neuropsych testing. IT was mention in his note.

## 2016-09-15 NOTE — Telephone Encounter (Signed)
Referral has been sent . Thanks Dana.  

## 2016-09-15 NOTE — Telephone Encounter (Signed)
Pt daughter (on [place]) asking for a call back re: psychiatric testing

## 2016-09-16 ENCOUNTER — Encounter: Payer: Self-pay | Admitting: Psychology

## 2016-09-16 DIAGNOSIS — R079 Chest pain, unspecified: Secondary | ICD-10-CM | POA: Diagnosis not present

## 2016-09-16 NOTE — Telephone Encounter (Signed)
Left vm for patients daughter that referral has been sent to Rodney Cruise heath at [place] neurology who does cognitive testing. Their number is [phone].Hte referral states a vm has been left for them from [place] neurolog[facility].

## 2016-09-17 ENCOUNTER — Other Ambulatory Visit: Payer: Self-pay | Admitting: Neurology

## 2016-09-17 DIAGNOSIS — N3941 Urge incontinence: Secondary | ICD-10-CM | POA: Diagnosis not present

## 2016-09-17 DIAGNOSIS — M25561 Pain in right knee: Secondary | ICD-10-CM | POA: Diagnosis not present

## 2016-09-17 DIAGNOSIS — M25562 Pain in left knee: Secondary | ICD-10-CM | POA: Diagnosis not present

## 2016-09-17 DIAGNOSIS — M62838 Other muscle spasm: Secondary | ICD-10-CM | POA: Diagnosis not present

## 2016-09-17 DIAGNOSIS — J439 Emphysema, unspecified: Secondary | ICD-10-CM | POA: Diagnosis not present

## 2016-09-17 DIAGNOSIS — R3915 Urgency of urination: Secondary | ICD-10-CM | POA: Diagnosis not present

## 2016-09-17 DIAGNOSIS — M6281 Muscle weakness (generalized): Secondary | ICD-10-CM | POA: Diagnosis not present

## 2016-09-17 DIAGNOSIS — I110 Hypertensive heart disease with heart failure: Secondary | ICD-10-CM | POA: Diagnosis not present

## 2016-09-17 DIAGNOSIS — I509 Heart failure, unspecified: Secondary | ICD-10-CM | POA: Diagnosis not present

## 2016-09-17 DIAGNOSIS — E1142 Type 2 diabetes mellitus with diabetic polyneuropathy: Secondary | ICD-10-CM | POA: Diagnosis not present

## 2016-09-17 DIAGNOSIS — K5900 Constipation, unspecified: Secondary | ICD-10-CM | POA: Diagnosis not present

## 2016-09-18 DIAGNOSIS — J439 Emphysema, unspecified: Secondary | ICD-10-CM | POA: Diagnosis not present

## 2016-09-18 DIAGNOSIS — Z6841 Body Mass Index (BMI) 40.0 and over, adult: Secondary | ICD-10-CM | POA: Diagnosis not present

## 2016-09-18 DIAGNOSIS — J449 Chronic obstructive pulmonary disease, unspecified: Secondary | ICD-10-CM | POA: Diagnosis not present

## 2016-09-18 DIAGNOSIS — M25562 Pain in left knee: Secondary | ICD-10-CM | POA: Diagnosis not present

## 2016-09-18 DIAGNOSIS — E1142 Type 2 diabetes mellitus with diabetic polyneuropathy: Secondary | ICD-10-CM | POA: Diagnosis not present

## 2016-09-18 DIAGNOSIS — Z01818 Encounter for other preprocedural examination: Secondary | ICD-10-CM | POA: Diagnosis not present

## 2016-09-18 DIAGNOSIS — M25561 Pain in right knee: Secondary | ICD-10-CM | POA: Diagnosis not present

## 2016-09-18 DIAGNOSIS — R0602 Shortness of breath: Secondary | ICD-10-CM | POA: Diagnosis not present

## 2016-09-18 DIAGNOSIS — I509 Heart failure, unspecified: Secondary | ICD-10-CM | POA: Diagnosis not present

## 2016-09-18 DIAGNOSIS — I110 Hypertensive heart disease with heart failure: Secondary | ICD-10-CM | POA: Diagnosis not present

## 2016-09-22 DIAGNOSIS — I509 Heart failure, unspecified: Secondary | ICD-10-CM | POA: Diagnosis not present

## 2016-09-22 DIAGNOSIS — J439 Emphysema, unspecified: Secondary | ICD-10-CM | POA: Diagnosis not present

## 2016-09-22 DIAGNOSIS — E1142 Type 2 diabetes mellitus with diabetic polyneuropathy: Secondary | ICD-10-CM | POA: Diagnosis not present

## 2016-09-22 DIAGNOSIS — M25561 Pain in right knee: Secondary | ICD-10-CM | POA: Diagnosis not present

## 2016-09-22 DIAGNOSIS — I110 Hypertensive heart disease with heart failure: Secondary | ICD-10-CM | POA: Diagnosis not present

## 2016-09-22 DIAGNOSIS — M25562 Pain in left knee: Secondary | ICD-10-CM | POA: Diagnosis not present

## 2016-09-23 ENCOUNTER — Telehealth: Payer: Self-pay | Admitting: Family Medicine

## 2016-09-23 NOTE — Telephone Encounter (Signed)
Bonnie Henry from [facility] requesting verbal orders for 2x week for 4 weeks with physical therapist. Pt had recent fall a few weeks ago. [phone]

## 2016-09-24 NOTE — Telephone Encounter (Signed)
Patient notified

## 2016-09-24 NOTE — Telephone Encounter (Signed)
That's fine.  Thank you.

## 2016-09-25 DIAGNOSIS — Z794 Long term (current) use of insulin: Secondary | ICD-10-CM | POA: Diagnosis not present

## 2016-09-25 DIAGNOSIS — E1165 Type 2 diabetes mellitus with hyperglycemia: Secondary | ICD-10-CM | POA: Diagnosis not present

## 2016-09-25 DIAGNOSIS — E1142 Type 2 diabetes mellitus with diabetic polyneuropathy: Secondary | ICD-10-CM | POA: Diagnosis not present

## 2016-09-29 DIAGNOSIS — I110 Hypertensive heart disease with heart failure: Secondary | ICD-10-CM | POA: Diagnosis not present

## 2016-09-29 DIAGNOSIS — I509 Heart failure, unspecified: Secondary | ICD-10-CM | POA: Diagnosis not present

## 2016-09-29 DIAGNOSIS — M25562 Pain in left knee: Secondary | ICD-10-CM | POA: Diagnosis not present

## 2016-09-29 DIAGNOSIS — J439 Emphysema, unspecified: Secondary | ICD-10-CM | POA: Diagnosis not present

## 2016-09-29 DIAGNOSIS — M25561 Pain in right knee: Secondary | ICD-10-CM | POA: Diagnosis not present

## 2016-09-29 DIAGNOSIS — E1142 Type 2 diabetes mellitus with diabetic polyneuropathy: Secondary | ICD-10-CM | POA: Diagnosis not present

## 2016-09-30 ENCOUNTER — Telehealth: Payer: Self-pay | Admitting: Family Medicine

## 2016-09-30 DIAGNOSIS — J439 Emphysema, unspecified: Secondary | ICD-10-CM | POA: Diagnosis not present

## 2016-09-30 DIAGNOSIS — E1142 Type 2 diabetes mellitus with diabetic polyneuropathy: Secondary | ICD-10-CM | POA: Diagnosis not present

## 2016-09-30 DIAGNOSIS — M1711 Unilateral primary osteoarthritis, right knee: Secondary | ICD-10-CM | POA: Diagnosis not present

## 2016-09-30 DIAGNOSIS — I110 Hypertensive heart disease with heart failure: Secondary | ICD-10-CM | POA: Diagnosis not present

## 2016-09-30 DIAGNOSIS — M25562 Pain in left knee: Secondary | ICD-10-CM | POA: Diagnosis not present

## 2016-09-30 DIAGNOSIS — I509 Heart failure, unspecified: Secondary | ICD-10-CM | POA: Diagnosis not present

## 2016-09-30 DIAGNOSIS — M25561 Pain in right knee: Secondary | ICD-10-CM | POA: Diagnosis not present

## 2016-09-30 NOTE — Telephone Encounter (Signed)
That's fine.  Thank you.

## 2016-09-30 NOTE — Telephone Encounter (Signed)
Left a detail messaged verbalizing the pt speech therapy that's suggested

## 2016-09-30 NOTE — Telephone Encounter (Signed)
Bonnie Henry from [facility] requesting a verbal for speech therapy related to cognitive communication deficits and swallowing difficulty. 2x weekly for 4 weeks [phone]

## 2016-10-01 ENCOUNTER — Ambulatory Visit: Payer: Self-pay | Admitting: Neurology

## 2016-10-03 DIAGNOSIS — M25561 Pain in right knee: Secondary | ICD-10-CM | POA: Diagnosis not present

## 2016-10-03 DIAGNOSIS — M25562 Pain in left knee: Secondary | ICD-10-CM | POA: Diagnosis not present

## 2016-10-03 DIAGNOSIS — J439 Emphysema, unspecified: Secondary | ICD-10-CM | POA: Diagnosis not present

## 2016-10-03 DIAGNOSIS — I110 Hypertensive heart disease with heart failure: Secondary | ICD-10-CM | POA: Diagnosis not present

## 2016-10-03 DIAGNOSIS — E1142 Type 2 diabetes mellitus with diabetic polyneuropathy: Secondary | ICD-10-CM | POA: Diagnosis not present

## 2016-10-03 DIAGNOSIS — I509 Heart failure, unspecified: Secondary | ICD-10-CM | POA: Diagnosis not present

## 2016-10-07 ENCOUNTER — Ambulatory Visit: Payer: Self-pay | Admitting: Family Medicine

## 2016-10-08 ENCOUNTER — Encounter: Payer: Self-pay | Admitting: Neurology

## 2016-10-08 ENCOUNTER — Ambulatory Visit (INDEPENDENT_AMBULATORY_CARE_PROVIDER_SITE_OTHER): Payer: Medicare Other | Admitting: Neurology

## 2016-10-08 VITALS — BP 139/71 | HR 63 | Wt 207.6 lb

## 2016-10-08 DIAGNOSIS — R51 Headache: Secondary | ICD-10-CM | POA: Diagnosis not present

## 2016-10-08 DIAGNOSIS — R2689 Other abnormalities of gait and mobility: Secondary | ICD-10-CM | POA: Diagnosis not present

## 2016-10-08 DIAGNOSIS — E0842 Diabetes mellitus due to underlying condition with diabetic polyneuropathy: Secondary | ICD-10-CM

## 2016-10-08 DIAGNOSIS — R4189 Other symptoms and signs involving cognitive functions and awareness: Secondary | ICD-10-CM

## 2016-10-08 DIAGNOSIS — F32A Depression, unspecified: Secondary | ICD-10-CM

## 2016-10-08 DIAGNOSIS — F329 Major depressive disorder, single episode, unspecified: Secondary | ICD-10-CM

## 2016-10-08 DIAGNOSIS — I6523 Occlusion and stenosis of bilateral carotid arteries: Secondary | ICD-10-CM

## 2016-10-08 DIAGNOSIS — G8929 Other chronic pain: Secondary | ICD-10-CM

## 2016-10-08 NOTE — Progress Notes (Signed)
NEUROLOGY CLINIC FOLLOW UP NOTE  NAME: Bonnie Henry DOB: [birth year]  HPI: Bonnie Henry is a 76 y.o. female with PMH of diabetes, COPD on oxygen, left breast cancer s/p mastectomy, severe RICA stenosis s/p CEA, hyperlipidemia, morbid obesity, memory loss and depression  who presents as a new patient for Frequent falls, intermittent disorientation and speech difficulty. patient is continent by her daughter.  Patient stated that since this year, she had about 15 falls. Sometimes there are mechanical falls, tripping on things, or turning too fast; sometimes when she is bending over to pick up things on the floor and she fell forward; sometimes she stands up fast with lightheadedness or just started walking and she is down; and sometimes she had right knee pain and her knees give out. She denies any leg weakness, loss of consciousness, or hitting her head. She is in wheelchair today with her daughter in office.  Patient and daughter reported intermittent disorientation about the place. She goes to bathroom, and comes out does not knowing where she was; sometimes goes to the wrong direction; asking to go home although he is right at home. Also has intermittent garbled speech, speech does not make sense or intelligible words. This episodes are more frequently having in the evenings, or she is tired, or more prominent at times she had severe cold several months ago.  She has been following with Dr. Posey Pronto at [facility] for memory loss. MRI showed moderate WM changes and atrophy. Has declined neuropsychologic testing in the past. She still on prednisone for COPD treatment and her glucose has been difficult for control.  She had a sleep study 05/2016 showed very mild OSA, currently not on CPAP, she stated that she can not have good sleep every day.  Interval history: During the interval time, pt daughter stated that pt condition getting worse. She started to have hallucinations, seeing people not  there, it could happen during the day or at night.  patient stated that she hearg her son's voice and sometimes she saw her son in the room. Sometimes she cannot recognize whether it was real or not. Auditory hallucination more frequent than visual hallucination. Patient also had one panic attack / paranoid episode 2 weeks ago while she was in her daughter's car, daughter went to [employer] shopping briefly, she had a nap in the car and then woke up and could not find anybody in the car, then she became panic, drove around the car and later stopped by police at another parking lot. At home, she also fear of people leaving her alone, always checking her daughter to see whether she was with her. As per daughter, she forgets children's name, taking medications, cannot remember what she was doing. She also has a lot of pain, limiting her activities. She also had worsening headaches recently. She felt depressed which confirmed by daughter, and she was crying during this clinical visit. She was on Zoloft in the past for depression, weaned off due to ineffectiveness, currently on Cymbalta. She had appointment for neuropsychological testing in November this year. As per daughter, patient COPD is getting worse, and will need to follow up with [facility] pulmonology sooner. Daughter still thinks patient had OSA which made her confusion on waking up every day in the morning. Sugar not in good control at home, fluctuating a lot. She has appointment this afternoon with PCP.  Past Medical History:  Diagnosis Date  . Anemia   . Anxiety   . Arthritis    "  joints ache all over" . osteoarthritis  . Asthma   . Atrial fibrillation ([place])   . Breast cancer, left breast (HCC)    S/P mastectomy  . CAD (coronary artery disease)   . CHF (congestive heart failure) ([place])   . Chronic back pain   . Constipation 03/04/2015  . COPD (chronic obstructive pulmonary disease) ([place])   . Depression, major   . Diabetes mellitus without complication  ([place])   . DVT (deep venous thrombosis) ([place])   . GERD (gastroesophageal reflux disease)   . Headache   . Heart murmur   . Hepatitis    HEP "C"  . History of blood transfusion    "when I had my mastectomy"  . History of hiatal hernia   . History of right-sided carotid endarterectomy 06/10/2015  . Hypercholesteremia   . Hypertension   . Morbid obesity ([place])   . Myocardial infarction ([place])   . Neuropathy   . Peripheral vascular disease ([place])   . Post-surgical hypothyroidism   . Restless leg syndrome   . Shortness of breath dyspnea   . Sleep apnea    "2015 wore mask; lost weight; told didn't need mask anymore" (02/23/2014)  . Type II diabetes mellitus ([place])    Past Surgical History:  Procedure Laterality Date  . ARTERY BIOPSY Right 09/29/2014   Procedure: BIOPSY TEMPORAL ARTERY;  Surgeon: Angelia Mould, MD;  Location: [place];  Service: Vascular;  Laterality: Right;  . BARTHOLIN CYST MARSUPIALIZATION    . BREAST BIOPSY Left    Mastectomy  . CARDIAC CATHETERIZATION  2000's  . CAROTID ENDARTERECTOMY Left 2009  . CATARACT EXTRACTION W/ INTRAOCULAR LENS  IMPLANT, BILATERAL Bilateral ~ 2011  . CORONARY ANGIOPLASTY WITH STENT PLACEMENT  1990's X 2   "2; 1"  . ENDARTERECTOMY Right 05/16/2015   Procedure: ENDARTERECTOMY CAROTID;  Surgeon: Katha Cabal, MD;  Location: [facility];  Service: Vascular;  Laterality: Right;  . ESOPHAGOGASTRODUODENOSCOPY (EGD) WITH PROPOFOL N/A 12/12/2014   Procedure: ESOPHAGOGASTRODUODENOSCOPY (EGD) WITH PROPOFOL;  Surgeon: Lucilla Lame, MD;  Location: [facility] ENDOSCOPY;  Service: Endoscopy;  Laterality: N/A;  . EYE SURGERY Bilateral    Catract Extraction with IOL  . HAMMER TOE SURGERY Left    2nd and 3rd digits  . JOINT REPLACEMENT Bilateral    Total Hip Replacement  . JOINT REPLACEMENT Left    Total Knee Replacement  . LEFT HEART CATHETERIZATION WITH CORONARY ANGIOGRAM N/A 02/24/2014   Procedure: LEFT HEART CATHETERIZATION WITH CORONARY ANGIOGRAM;   Surgeon: Leonie Man, MD;  Location: [facility] CATH LAB;  Service: Cardiovascular;  Laterality: N/A;  . MASTECTOMY Left 1990's  . SHOULDER SURGERY Right    "cleaned it out; no scope"  . THYROIDECTOMY, PARTIAL  1950's  . TOE SURGERY Right    "cut piece of bone out so toe didn't dig into my foot"  . TONSILLECTOMY  1950's  . TOTAL HIP ARTHROPLASTY Bilateral 1990's  . TOTAL KNEE ARTHROPLASTY Left 1990's  . TRIGGER FINGER RELEASE Left   . TUBAL LIGATION    . VAGINAL HYSTERECTOMY     "partial"   Family History  Problem Relation Age of Onset  . Arthritis Mother   . Cancer Mother   . Diabetes Mother   . Heart disease Mother   . Hyperlipidemia Mother   . Hypertension Mother   . Alcohol abuse Father   . Brain cancer Father   . Alcohol abuse Brother   . Arthritis Brother   . Asthma Brother   .  HIV Brother   . Cancer Brother   . Diabetes Brother   . Hyperlipidemia Brother   . Hypertension Brother   . Lung disease Brother   . Arthritis Maternal Grandmother    Current Outpatient Prescriptions  Medication Sig Dispense Refill  . acetaminophen (TYLENOL) 500 MG tablet Take 2 tablets (1,000 mg total) by mouth every 6 (six) hours as needed for moderate pain. 30 tablet 0  . albuterol (ACCUNEB) 0.63 MG/3ML nebulizer solution Take 1 ampule by nebulization every 6 (six) hours as needed for wheezing or shortness of breath.     Marland Kitchen albuterol (PROAIR HFA) 108 (90 BASE) MCG/ACT inhaler Inhale 2 puffs into the lungs every 4 (four) hours as needed for wheezing or shortness of breath.     . Ascorbic Acid (VITAMIN C PO) Take 1 tablet by mouth 3 (three) times a week.     Marland Kitchen aspirin EC 81 MG tablet Take 81 mg by mouth daily.    Marland Kitchen atorvastatin (LIPITOR) 80 MG tablet Take 1 tablet (80 mg total) by mouth at bedtime. 90 tablet 1  . budesonide (PULMICORT) 0.5 MG/2ML nebulizer solution Take 0.5 mg by nebulization 2 (two) times daily.     . bumetanide (BUMEX) 0.5 MG tablet Take 0.5 mg by mouth as needed.     .  busPIRone (BUSPAR) 15 MG tablet TAKE ONE TABLET BY MOUTH TWICE DAILY. 180 tablet 3  . carvedilol (COREG) 12.5 MG tablet Take 12.5 mg by mouth 2 (two) times daily with a meal.     . DULoxetine (CYMBALTA) 60 MG capsule Take 1 capsule (60 mg total) by mouth daily. 30 capsule 3  . fluticasone (FLONASE) 50 MCG/ACT nasal spray Place 2 sprays into both nostrils as needed for allergies.     . formoterol (PERFOROMIST) 20 MCG/2ML nebulizer solution Inhale 20 mcg into the lungs 2 (two) times daily.     Marland Kitchen gabapentin (NEURONTIN) 300 MG capsule TAKE ONE CAPSULE BY MOUTH TWICE DAILY (Patient taking differently: TAKE 600 mg BY MOUTH TWICE DAILY) 60 capsule 6  . Hypromellose (ARTIFICIAL TEARS OP) Place 1 drop into both eyes as needed (dry eyes).     . insulin NPH Human (HUMULIN N,NOVOLIN N) 100 UNIT/ML injection Inject 24 Units into the skin at bedtime.     . insulin regular (NOVOLIN R,HUMULIN R) 100 units/mL injection Inject 3-20 Units into the skin 3 (three) times daily before meals. Based on sliding scale     . levothyroxine (SYNTHROID, LEVOTHROID) 100 MCG tablet Take 1 tablet (100 mcg total) by mouth every other day. (alternate with the 112 mcg strength) (Patient taking differently: Take 100 mcg by mouth See admin instructions. Take 1 tablet (100 mcg) by mouth before breakfast Monday thru Thursday (take 112 mcg other days)) 15 tablet 2  . levothyroxine (SYNTHROID, LEVOTHROID) 112 MCG tablet Take 1 tablet (112 mcg total) by mouth every other day. (alternate with the 100 mcg strength) (Patient taking differently: Take 112 mcg by mouth See admin instructions. Take 1 tablet (112 mcg) by mouth before breakfast on Friday, Saturday, Sunday (take 100 mcg other days)) 15 tablet 2  . meclizine (ANTIVERT) 25 MG tablet Take 0.5-1 tablets (12.5-25 mg total) by mouth 3 (three) times daily as needed for dizziness. 30 tablet 0  . montelukast (SINGULAIR) 10 MG tablet Take 1 tablet (10 mg total) by mouth daily. 30 tablet 11  .  nitroGLYCERIN (NITROSTAT) 0.4 MG SL tablet Place 0.4 mg under the tongue every 5 (five) minutes as needed  for chest pain.    . Omega-3 Fatty Acids (FISH OIL) 1200 MG CAPS Take 1 capsule by mouth 2 (two) times a week.     . OXYGEN Inhale 2 L into the lungs continuous.    . pantoprazole (PROTONIX) 20 MG tablet TAKE ONE TABLET BY MOUTH TWICE DAILY 180 tablet 1  . ranitidine (ZANTAC) 300 MG tablet Take 1 tablet (300 mg total) by mouth at bedtime. 30 tablet 11  . rOPINIRole (REQUIP) 1 MG tablet Take 1 tablet (1 mg total) by mouth 3 (three) times daily. 90 tablet 1  . spironolactone (ALDACTONE) 25 MG tablet Take 25 mg by mouth daily.    Marland Kitchen tiZANidine (ZANAFLEX) 2 MG tablet TAKE 1 TABLET BY MOUTH AT BEDTIME 30 tablet 2   No current facility-administered medications for this visit.    Allergies  Allergen Reactions  . Vasotec [Enalapril] Swelling    Throat swells  . Codeine Nausea And Vomiting  . Morphine And Related Nausea And Vomiting   Social History   Social History  . Marital status: Widowed    Spouse name: N/A  . Number of children: N/A  . Years of education: N/A   Occupational History  . Not on file.   Social History Main Topics  . Smoking status: Former Smoker    Packs/day: 1.50    Years: 44.00    Types: Cigarettes    Quit date: 01/06/1994  . Smokeless tobacco: Never Used     Comment: "quit smoking cigarettes in ~ 2000"  . Alcohol use No     Comment: 02/23/2014 "no alcohol since 1990's"  . Drug use: No  . Sexual activity: No   Other Topics Concern  . Not on file   Social History Narrative   Lives with daughter in a mobile home.  Has 4 children alive, 1 deceased.  Retired from [employer].  Education: high school.    Review of Systems Full 14 system review of systems performed and notable only for those listed, all others are neg:  Constitutional:  fatigue Cardiovascular: Murmur Ear/Nose/Throat:  Hearing loss, Ringing in ears Skin:  Eyes:  Light sensitivity Respiratory:   SOB Gastroitestinal: Nausea Genitourinary: Difficulty urination, urinary disease Hematology/Lymphatic:  Easy bruising Endocrine:  Musculoskeletal:  Joint pain, joint swelling, , back pain, aching muscles, walking difficulty Allergy/Immunology:  Runny nose, skin sensitivity Neurological:  Memory loss,  headache, weakness, slurred speech, dizziness, tremors  Psychiatric: agitation, confusion, decreased concentration, depression, hallucinations  Sleep: Snoring, restless legs, daytime sleepiness   Physical Exam  Vitals:   10/08/16 1115  BP: 139/71  Pulse: 63    General - Morbid obesity, well developed, on home oxygen with mild respiratory distress. Crying in clinic due to depression  Ophthalmologic - fundi not visualized due to eye movement.  Cardiovascular - Regular rate and rhythm with no murmur.   Neck - supple, no nuchal rigidity.  Mental Status -  Level of arousal and orientation to time, place, and person were intact. Language including expression, naming, repetition, comprehension, reading, and writing was assessed and found intact. Fund of Knowledge was assessed and was intact.  Cranial Nerves II - XII - II - Visual field intact OU. III, IV, VI - Extraocular movements intact. V - Facial sensation intact bilaterally. VII - Facial movement intact bilaterally. VIII - Hearing & vestibular intact bilaterally. X - Palate elevates symmetrically. XI - Chin turning & shoulder shrug intact bilaterally. XII - Tongue protrusion intact.  Motor Strength - The patient's strength was  5/5 BUEs, 4/5 BLEs and pronator drift was absent.  Bulk was normal and fasciculations were absent.   Motor Tone - Muscle tone was assessed at the neck and appendages and was normal.  Reflexes - The patient's reflexes were normal in all extremities and she had no pathological reflexes.  Sensory - Light touch, temperature/pinprick were assessed and were normal    Coordination - The patient had normal  movements in the hands with no ataxia or dysmetria.  Tremor was absent.  Gait and Station - in wheelchair, not tested.   Imaging  I have personally reviewed the radiological images below and agree with the radiology interpretations.  MRI cervical spine wo contrast 02/20/2015: 1. Cervical spondylosis and degenerative disc disease, causing mild impingement at the C4-5, C5-6, and C6-7 levels, as detailed above. 2. Suspected mild chronic ischemic microvascular white matter disease along the posterior pons, not changed from prior.  MRI brain wo contrast 04/25/2015: 1. No acute or focal abnormality to explain the patient's symptoms. 2. Moderate generalized atrophy and moderate to severe diffuse white matter disease. This likely reflects the sequela of chronic microvascular ischemia. 3. Stable exophthalmos.  Mri and MRA Head Wo Contrast 08/22/2016 IMPRESSION: Brain MRI: 1. No acute finding or change from 04/25/2015. 2. Extensive chronic small vessel ischemia in the cerebral white matter. 3. Atrophy and ventriculomegaly. Intracranial MRA: Negative.   TTE 02/2014 Left ventricle: The cavity size was normal. Wall thickness was increased in a pattern of mild LVH. The estimated ejection fraction was 60%. Regional wall motion abnormalities cannot be excluded. Doppler parameters are consistent with high ventricular filling pressure. - Aortic valve: Valve is poorly seen. There is some thickening. I suspect mild AS considering all data. Trace AI. - Left atrium: The atrium was mildly dilated. - Right ventricle: The cavity size was normal. Systolic function was normal.  05/2016 sleep study - very mild OSA, AHI only 6.1  Lab Review Component     Latest Ref Rng & Units 05/29/2016 07/06/2016 08/06/2016  Cholesterol     <200 mg/dL 183  181  Triglycerides     <150 mg/dL 153 (H)  148  HDL Cholesterol     >50 mg/dL 63  69  Total CHOL/HDL Ratio     <5.0 Ratio 2.9  2.6  VLDL     <30 mg/dL 31  (H)  30  LDL (calc)     <100 mg/dL 89  82  Hemoglobin A1C     4.8 - 5.6 %  7.6 (H)   Mean Plasma Glucose     mg/dL  171   TSH     mIU/L   2.14     Assessment   In summary, Bonnie Henry is a 76 y.o. female with PMH of diabetes, COPD on oxygen, left breast cancer s/p mastectomy, severe RICA stenosis s/p CEA, hyperlipidemia, morbid obesity, memory loss and depression  who followed in clinic for Frequent falls, intermittent disorientation and speech difficulty. Her falls are related to mechanical falls or getting up too fast. Her lose of place orientation and dysarthria are related to tiredness/physical condition decline. MRI showed brain atrophy and WM changes. Her memory issue, paranoid behavior, panic attack, hallucinations are more consistent with depression, decreased brain reserve and MCI. Neuropsych testing pending in 11/2016. Pt will need psychiatry and psychology referral, and follow up with PCP to manage chronic medical condition such as COPD, DM, chronic pain.   Plan: - continue ASA and lipitor for stroke prevention - continue follow  up with pulmonary regarding COPD control - check BP and glucose at home and record - Follow up with your primary care physician for stroke risk factor modification. Recommend maintain blood pressure goal <130/80, diabetes with hemoglobin A1c goal below 7.0% and lipids with LDL cholesterol goal below 70 mg/dL.  - pending neuropsychological testing  - following with PCP this afternoon to discuss about optimal treatment of associated medical conditions - recommend psychology and psychiatry referral for depression treatment.  - avoid over exertion, stress, infection to avoid lower brain function.  - avoid fall as possible, use walker if need to walk, avoid standing up too fast and stand for a while before start to walk.  - follow up in 3 months.   I spent more than 25 minutes of face to face time with the patient. Greater than 50% of time was spent  in counseling and coordination of care. We discussed etiology of MCI, treatment of depression, managing chronic medical conditions.   No orders of the defined types were placed in this encounter.   No orders of the defined types were placed in this encounter.   Patient Instructions   - continue ASA and lipitor for stroke prevention - continue follow up with pulmonary regarding COPD control - check BP and glucose at home and record - Follow up with your primary care physician for stroke risk factor modification. Recommend maintain blood pressure goal <130/80, diabetes with hemoglobin A1c goal below 7.0% and lipids with LDL cholesterol goal below 70 mg/dL.  - pending neuropsychological testing  - following with PCP this afternoon to discuss about optimal treatment of associated medical conditions - recommend psychology and psychiatry referral for depression treatment.  - avoid over exertion, stress, infection to avoid lower brain function.  - avoid fall as possible, use walker if need to walk, avoid standing up too fast and stand for a while before start to walk.  - follow up in 3 months.    Rosalin Hawking, MD PhD [facility] [address] [place], [place] [address] [phone]

## 2016-10-08 NOTE — Patient Instructions (Addendum)
-   continue ASA and lipitor for stroke prevention - continue follow up with pulmonary regarding COPD control - check BP and glucose at home and record - Follow up with your primary care physician for stroke risk factor modification. Recommend maintain blood pressure goal <130/80, diabetes with hemoglobin A1c goal below 7.0% and lipids with LDL cholesterol goal below 70 mg/dL.  - pending neuropsychological testing  - following with PCP this afternoon to discuss about optimal treatment of associated medical conditions - recommend psychology and psychiatry referral for depression treatment.  - avoid over exertion, stress, infection to avoid lower brain function.  - avoid fall as possible, use walker if need to walk, avoid standing up too fast and stand for a while before start to walk.  - follow up in 3 months.

## 2016-10-09 DIAGNOSIS — K5900 Constipation, unspecified: Secondary | ICD-10-CM | POA: Diagnosis not present

## 2016-10-09 DIAGNOSIS — R3915 Urgency of urination: Secondary | ICD-10-CM | POA: Diagnosis not present

## 2016-10-09 DIAGNOSIS — N311 Reflex neuropathic bladder, not elsewhere classified: Secondary | ICD-10-CM | POA: Diagnosis not present

## 2016-10-09 DIAGNOSIS — M62838 Other muscle spasm: Secondary | ICD-10-CM | POA: Diagnosis not present

## 2016-10-09 DIAGNOSIS — R32 Unspecified urinary incontinence: Secondary | ICD-10-CM | POA: Diagnosis not present

## 2016-10-09 DIAGNOSIS — M6281 Muscle weakness (generalized): Secondary | ICD-10-CM | POA: Diagnosis not present

## 2016-10-10 ENCOUNTER — Encounter: Payer: Self-pay | Admitting: Family Medicine

## 2016-10-10 ENCOUNTER — Ambulatory Visit (INDEPENDENT_AMBULATORY_CARE_PROVIDER_SITE_OTHER): Payer: Medicare Other | Admitting: Family Medicine

## 2016-10-10 VITALS — BP 118/64 | HR 76 | Temp 98.3°F | Resp 16 | Wt 206.0 lb

## 2016-10-10 DIAGNOSIS — R413 Other amnesia: Secondary | ICD-10-CM | POA: Diagnosis not present

## 2016-10-10 DIAGNOSIS — R4189 Other symptoms and signs involving cognitive functions and awareness: Secondary | ICD-10-CM

## 2016-10-10 DIAGNOSIS — I6523 Occlusion and stenosis of bilateral carotid arteries: Secondary | ICD-10-CM

## 2016-10-10 DIAGNOSIS — M25562 Pain in left knee: Secondary | ICD-10-CM | POA: Diagnosis not present

## 2016-10-10 DIAGNOSIS — I110 Hypertensive heart disease with heart failure: Secondary | ICD-10-CM | POA: Diagnosis not present

## 2016-10-10 DIAGNOSIS — E1140 Type 2 diabetes mellitus with diabetic neuropathy, unspecified: Secondary | ICD-10-CM

## 2016-10-10 DIAGNOSIS — Z794 Long term (current) use of insulin: Secondary | ICD-10-CM | POA: Diagnosis not present

## 2016-10-10 DIAGNOSIS — M25561 Pain in right knee: Secondary | ICD-10-CM | POA: Diagnosis not present

## 2016-10-10 DIAGNOSIS — J9611 Chronic respiratory failure with hypoxia: Secondary | ICD-10-CM

## 2016-10-10 DIAGNOSIS — E1142 Type 2 diabetes mellitus with diabetic polyneuropathy: Secondary | ICD-10-CM | POA: Diagnosis not present

## 2016-10-10 DIAGNOSIS — I509 Heart failure, unspecified: Secondary | ICD-10-CM | POA: Diagnosis not present

## 2016-10-10 DIAGNOSIS — R296 Repeated falls: Secondary | ICD-10-CM | POA: Diagnosis not present

## 2016-10-10 DIAGNOSIS — Z23 Encounter for immunization: Secondary | ICD-10-CM | POA: Diagnosis not present

## 2016-10-10 DIAGNOSIS — J439 Emphysema, unspecified: Secondary | ICD-10-CM | POA: Diagnosis not present

## 2016-10-10 MED ORDER — LINAGLIPTIN 5 MG PO TABS: 5.0000 mg | ORAL_TABLET | Freq: Every day | ORAL | 0 refills | Status: DC

## 2016-10-10 NOTE — Patient Instructions (Signed)
Please do see your new diabetes and thyroid doctor We'll have you start working with the psychologist and psychiatrist If you have not heard anything from my staff in a week about any orders/referrals/studies from today, please contact us here to follow-up [phone] Start the new medicine for your diabetes If your sugars start to drop, then reduce your insulin by 25-30% and contact us right away

## 2016-10-10 NOTE — Assessment & Plan Note (Signed)
Will have cognitive assessment soon, ordered through neurologist

## 2016-10-10 NOTE — Assessment & Plan Note (Signed)
Wearing supplemental oxygen ATC

## 2016-10-10 NOTE — Assessment & Plan Note (Addendum)
Foot exam by MD; glad to hear that she will be seeing new endocrinologist soon; will add tradjenta and leave her insulin regimen alone for them to adjust; I explained that I am not an endocrinologist, that new doctor may opt to discontinue this or use something else or adjust her insulin further, will defer those decisions to him; however, I did not want to leave her sugars uncontrolled until she got in for her appointment, hence the new Rx; if sugars do start to come down significantly prior to endo appt, daughter / patient may decrease her insulin 25-30% and contact us

## 2016-10-10 NOTE — Progress Notes (Signed)
BP 118/64   Pulse 76   Temp 98.3 F (36.8 C) (Oral)   Resp 16   Wt 206 lb (93.4 kg)   SpO2 97% Comment: 2L  BMI 41.61 kg/m    Subjective:    Patient ID: Bonnie Henry, female    DOB: [birth year], 76 y.o.   MRN: [id number]  HPI: Bonnie Henry is a 76 y.o. female  Chief Complaint  Patient presents with  . Dementia  . Follow-up   HPI Patient is here for her dementia; daughter with her today They went to see the neurologist two days ago, Dr. Rosalin Hawking He doesn't think she has true dementia She has cognitive impairment Reviewed note together Intermittent disorientation, goes to the bathroom and might not know where she was; goes in the wrong direction at time Intermittent garbled speech More frequently in the evenings, or when tired, which is now all day long; not as bad as in the evening when it's worse She forgets the names of her children, not just grandchildren Dr. Posey Pronto at [facility] was also seen for memory loss, MRI showed moderate WM changes and atrophy Mild OSA, not on CPAP; one doctor wanted her on CPAP, patient says the other did not say she had it Her pulmonologist said that the test came back and showed she didn't need it Thought about having knee surgery; pulm and cardio approved that, but ortho suggested shots with gel before surgery They are going to try 3 shots before surgery She might need BiPAP for a short period of time after surgery COPD getting worse per pulm Full neurologic exam just two days ago done and she'll go to cognitive testing He recommended psychiatrist and psychologist Daughter went into [place], patient was in the car; asked if she wanted to come in; left keys in the car; left windows down so she wouldn't be hot; daughter came out of the store and the car was gone and her mother was gone; she searched the parking lot and used her key fob to find the car which was down at [place]; patient had called the police because she did not know where  she was, was scared; patient had fallen asleep and woke up and didn't know where she was Daughter no longer leaving her alone; does not have alert (recommended right away) In home care, has PT now; she will getting speech and OT soon They are going to work with her in regards to her memory [employer] is the company she is working with Neurologist said that her sugar and depression and possibly her thyroid could all be going on together They go see the new endocrinologist, this month Blood sugars are too high; checking FSBS at home, most of the time, over 200 Dumping sugar in her urine per urologist, saw that doctor yesterday BP is stable, does not think it's too low  Depression screen [facility] 2/9 10/10/2016 08/22/2016 08/06/2016 05/02/2016 01/29/2016  Decreased Interest 0 0 0 0 0  Down, Depressed, Hopeless 1 1 1 1  0  PHQ - 2 Score 1 1 1 1  0  Altered sleeping - - - - -  Tired, decreased energy - - - - -  Change in appetite - - - - -  Feeling bad or failure about yourself  - - - - -  Trouble concentrating - - - - -  Moving slowly or fidgety/restless - - - - -  Suicidal thoughts - - - - -  PHQ-9 Score - - - - -  Difficult doing work/chores - - - - -    Relevant past medical, surgical, family and social history reviewed Past Medical History:  Diagnosis Date  . Anemia   . Anxiety   . Arthritis    "joints ache all over" . osteoarthritis  . Asthma   . Atrial fibrillation ([place])   . Breast cancer, left breast (HCC)    S/P mastectomy  . CAD (coronary artery disease)   . CHF (congestive heart failure) ([place])   . Chronic back pain   . Constipation 03/04/2015  . COPD (chronic obstructive pulmonary disease) ([place])   . Depression, major   . Diabetes mellitus without complication ([place])   . DVT (deep venous thrombosis) ([place])   . GERD (gastroesophageal reflux disease)   . Headache   . Heart murmur   . Hepatitis    HEP "C"  . History of blood transfusion    "when I had my mastectomy"  . History of  hiatal hernia   . History of right-sided carotid endarterectomy 06/10/2015  . Hypercholesteremia   . Hypertension   . Morbid obesity ([place])   . Myocardial infarction ([place])   . Neuropathy   . Peripheral vascular disease ([place])   . Post-surgical hypothyroidism   . Restless leg syndrome   . Shortness of breath dyspnea   . Sleep apnea    "2015 wore mask; lost weight; told didn't need mask anymore" (02/23/2014)  . Type II diabetes mellitus ([place])    Past Surgical History:  Procedure Laterality Date  . ARTERY BIOPSY Right 09/29/2014   Procedure: BIOPSY TEMPORAL ARTERY;  Surgeon: Angelia Mould, MD;  Location: [facility];  Service: Vascular;  Laterality: Right;  . BARTHOLIN CYST MARSUPIALIZATION    . BREAST BIOPSY Left    Mastectomy  . CARDIAC CATHETERIZATION  2000's  . CAROTID ENDARTERECTOMY Left 2009  . CATARACT EXTRACTION W/ INTRAOCULAR LENS  IMPLANT, BILATERAL Bilateral ~ 2011  . CORONARY ANGIOPLASTY WITH STENT PLACEMENT  1990's X 2   "2; 1"  . ENDARTERECTOMY Right 05/16/2015   Procedure: ENDARTERECTOMY CAROTID;  Surgeon: Katha Cabal, MD;  Location: [facility];  Service: Vascular;  Laterality: Right;  . ESOPHAGOGASTRODUODENOSCOPY (EGD) WITH PROPOFOL N/A 12/12/2014   Procedure: ESOPHAGOGASTRODUODENOSCOPY (EGD) WITH PROPOFOL;  Surgeon: Lucilla Lame, MD;  Location: [facility];  Service: Endoscopy;  Laterality: N/A;  . EYE SURGERY Bilateral    Catract Extraction with IOL  . HAMMER TOE SURGERY Left    2nd and 3rd digits  . JOINT REPLACEMENT Bilateral    Total Hip Replacement  . JOINT REPLACEMENT Left    Total Knee Replacement  . LEFT HEART CATHETERIZATION WITH CORONARY ANGIOGRAM N/A 02/24/2014   Procedure: LEFT HEART CATHETERIZATION WITH CORONARY ANGIOGRAM;  Surgeon: Leonie Man, MD;  Location: [facility] C[facility];  Service: Cardiovascular;  Laterality: N/A;  . MASTECTOMY Left 1990's  . SHOULDER SURGERY Right    "cleaned it out; no scope"  . THYROIDECTOMY, PARTIAL  1950's  . TOE  SURGERY Right    "cut piece of bone out so toe didn't dig into my foot"  . TONSILLECTOMY  1950's  . TOTAL HIP ARTHROPLASTY Bilateral 1990's  . TOTAL KNEE ARTHROPLASTY Left 1990's  . TRIGGER FINGER RELEASE Left   . TUBAL LIGATION    . VAGINAL HYSTERECTOMY     "partial"   Family History  Problem Relation Age of Onset  . Arthritis Mother   . Cancer Mother   . Diabetes Mother   . Heart disease Mother   .  Hyperlipidemia Mother   . Hypertension Mother   . Alcohol abuse Father   . Brain cancer Father   . Arthritis Maternal Grandmother   . Alzheimer's disease Maternal Grandmother   . Diabetes Daughter   . Hyperlipidemia Daughter   . Hypertension Daughter   . Thyroid cancer Daughter   . Diabetes Son   . Diabetes Daughter   . Hypertension Daughter   . Diabetes Son   . Hypertension Son   . Hyperlipidemia Son   . Thyroid cancer Son   . Diabetes Brother   . Diabetes Brother    Social History   Social History  . Marital status: Widowed    Spouse name: N/A  . Number of children: N/A  . Years of education: N/A   Occupational History  . Not on file.   Social History Main Topics  . Smoking status: Former Smoker    Packs/day: 1.50    Years: 44.00    Types: Cigarettes    Quit date: 01/06/1994  . Smokeless tobacco: Never Used     Comment: "quit smoking cigarettes in ~ 2000"  . Alcohol use No     Comment: 02/23/2014 "no alcohol since 1990's"  . Drug use: No  . Sexual activity: No   Other Topics Concern  . Not on file   Social History Narrative   Lives with daughter in a mobile home.  Has 4 children alive, 1 deceased.  Retired from [employer].  Education: high school.    Interim medical history since last visit reviewed. Allergies and medications reviewed  Review of Systems  Psychiatric/Behavioral: Negative for dysphoric mood, self-injury and suicidal ideas.   Per HPI unless specifically indicated above     Objective:    BP 118/64   Pulse 76   Temp 98.3 F (36.8 C)  (Oral)   Resp 16   Wt 206 lb (93.4 kg)   SpO2 97% Comment: 2L  BMI 41.61 kg/m   Wt Readings from Last 3 Encounters:  10/10/16 206 lb (93.4 kg)  10/08/16 207 lb 9.6 oz (94.2 kg)  08/25/16 204 lb 6.4 oz (92.7 kg)    Physical Exam  Constitutional:  Obese, elderly female seated in wheelchair; gait not assessed; appears chronically deconditioned and ill; nontoxic  HENT:  Nose: No rhinorrhea.  Mouth/Throat: Oropharynx is clear and moist and mucous membranes are normal.  Wearing supplemental oxygen via nasal cannula  Cardiovascular: Normal rate and regular rhythm.   Pulmonary/Chest: Effort normal. She has decreased breath sounds.  Neurological: She is alert. She is not disoriented. She displays no tremor.  Left pupil is fixed and dilated; gait not assessed; has walker with her today  Skin: Ecchymosis noted. No pallor.  Psychiatric: She has a normal mood and affect. Her mood appears not anxious.  Dry sense of humor, a little irritable today   Diabetic Foot Form - Detailed   Diabetic Foot Exam - detailed Diabetic Foot exam was performed with the following findings:  Yes 10/10/2016  2:19 PM  Visual Foot Exam completed.:  Yes  Pulse Foot Exam completed.:  Yes  Right Dorsalis Pedis:  Present Left Dorsalis Pedis:  Present  Sensory Foot Exam Completed.:  Yes Semmes-Weinstein Monofilament Test R Site 1-Great Toe:  Neg L Site 1-Great Toe:  Neg    Comments:  S/p toenail removal 2nd digit RIGHT foot       Assessment & Plan:   Problem List Items Addressed This Visit      Respiratory   Chronic  respiratory failure with hypoxia (HCC)    Wearing supplemental oxygen ATC        Endocrine   Diabetes mellitus with diabetic neuropathy ([place])    Foot exam by MD; glad to hear that she will be seeing new endocrinologist soon; will add tradjenta and leave her insulin regimen alone for them to adjust; I explained that I am not an endocrinologist, that new doctor may opt to discontinue this or use  something else or adjust her insulin further, will defer those decisions to him; however, I did not want to leave her sugars uncontrolled until she got in for her appointment, hence the new Rx; if sugars do start to come down significantly prior to endo appt, daughter / patient may decrease her insulin 25-30% and contact us      Relevant Medications   linagliptin (TRADJENTA) 5 MG TABS tablet     Other   Morbid obesity ([place])    Patient not losing any appreciable weight      Relevant Medications   linagliptin (TRADJENTA) 5 MG TABS tablet   Memory loss, short term - Primary    Refer to psychiatrist and psychologist per neurologist recommendation, as depression may be affecting her memory      Relevant Orders   Ambulatory referral to Psychiatry   Ambulatory referral to Psychology   Frequent falls    Receiving multi-modal therapy at home      Cognitive impairment    Will have cognitive assessment soon, ordered through neurologist      Relevant Orders   Ambulatory referral to Psychiatry   Ambulatory referral to Psychology    Other Visit Diagnoses    Needs flu shot       Relevant Orders   Flu vaccine HIGH DOSE PF (Fluzone High dose) (Completed)       Follow up plan: No Follow-up on file.  An after-visit summary was printed and given to the patient at [place].  Please see the patient instructions which may contain other information and recommendations beyond what is mentioned above in the assessment and plan.  Meds ordered this encounter  Medications  . linagliptin (TRADJENTA) 5 MG TABS tablet    Sig: Take 1 tablet (5 mg total) by mouth daily.    Dispense:  30 tablet    Refill:  0    Orders Placed This Encounter  Procedures  . Flu vaccine HIGH DOSE PF (Fluzone High dose)  . Ambulatory referral to Psychiatry  . Ambulatory referral to Psychology

## 2016-10-10 NOTE — Assessment & Plan Note (Signed)
Receiving multi-modal therapy at home

## 2016-10-10 NOTE — Assessment & Plan Note (Signed)
Refer to psychiatrist and psychologist per neurologist recommendation, as depression may be affecting her memory

## 2016-10-10 NOTE — Assessment & Plan Note (Signed)
Patient not losing any appreciable weight

## 2016-10-13 ENCOUNTER — Telehealth: Payer: Self-pay

## 2016-10-13 DIAGNOSIS — M25561 Pain in right knee: Secondary | ICD-10-CM | POA: Diagnosis not present

## 2016-10-13 DIAGNOSIS — I509 Heart failure, unspecified: Secondary | ICD-10-CM | POA: Diagnosis not present

## 2016-10-13 DIAGNOSIS — E1142 Type 2 diabetes mellitus with diabetic polyneuropathy: Secondary | ICD-10-CM | POA: Diagnosis not present

## 2016-10-13 DIAGNOSIS — I110 Hypertensive heart disease with heart failure: Secondary | ICD-10-CM | POA: Diagnosis not present

## 2016-10-13 DIAGNOSIS — J439 Emphysema, unspecified: Secondary | ICD-10-CM | POA: Diagnosis not present

## 2016-10-13 DIAGNOSIS — M25562 Pain in left knee: Secondary | ICD-10-CM | POA: Diagnosis not present

## 2016-10-13 NOTE — Telephone Encounter (Signed)
[facility] called to report a fall, no injury EMS came out to help get her up and checked her out.

## 2016-10-15 DIAGNOSIS — I110 Hypertensive heart disease with heart failure: Secondary | ICD-10-CM | POA: Diagnosis not present

## 2016-10-15 DIAGNOSIS — I509 Heart failure, unspecified: Secondary | ICD-10-CM | POA: Diagnosis not present

## 2016-10-15 DIAGNOSIS — M25561 Pain in right knee: Secondary | ICD-10-CM | POA: Diagnosis not present

## 2016-10-15 DIAGNOSIS — M25562 Pain in left knee: Secondary | ICD-10-CM | POA: Diagnosis not present

## 2016-10-15 DIAGNOSIS — E1142 Type 2 diabetes mellitus with diabetic polyneuropathy: Secondary | ICD-10-CM | POA: Diagnosis not present

## 2016-10-15 DIAGNOSIS — J439 Emphysema, unspecified: Secondary | ICD-10-CM | POA: Diagnosis not present

## 2016-10-17 DIAGNOSIS — I509 Heart failure, unspecified: Secondary | ICD-10-CM | POA: Diagnosis not present

## 2016-10-17 DIAGNOSIS — M25561 Pain in right knee: Secondary | ICD-10-CM | POA: Diagnosis not present

## 2016-10-17 DIAGNOSIS — J439 Emphysema, unspecified: Secondary | ICD-10-CM | POA: Diagnosis not present

## 2016-10-17 DIAGNOSIS — E1142 Type 2 diabetes mellitus with diabetic polyneuropathy: Secondary | ICD-10-CM | POA: Diagnosis not present

## 2016-10-17 DIAGNOSIS — M25562 Pain in left knee: Secondary | ICD-10-CM | POA: Diagnosis not present

## 2016-10-17 DIAGNOSIS — I110 Hypertensive heart disease with heart failure: Secondary | ICD-10-CM | POA: Diagnosis not present

## 2016-10-22 DIAGNOSIS — M1711 Unilateral primary osteoarthritis, right knee: Secondary | ICD-10-CM | POA: Diagnosis not present

## 2016-10-23 DIAGNOSIS — I509 Heart failure, unspecified: Secondary | ICD-10-CM | POA: Diagnosis not present

## 2016-10-23 DIAGNOSIS — M25562 Pain in left knee: Secondary | ICD-10-CM | POA: Diagnosis not present

## 2016-10-23 DIAGNOSIS — J439 Emphysema, unspecified: Secondary | ICD-10-CM | POA: Diagnosis not present

## 2016-10-23 DIAGNOSIS — E1142 Type 2 diabetes mellitus with diabetic polyneuropathy: Secondary | ICD-10-CM | POA: Diagnosis not present

## 2016-10-23 DIAGNOSIS — M25561 Pain in right knee: Secondary | ICD-10-CM | POA: Diagnosis not present

## 2016-10-23 DIAGNOSIS — I110 Hypertensive heart disease with heart failure: Secondary | ICD-10-CM | POA: Diagnosis not present

## 2016-10-28 DIAGNOSIS — M1711 Unilateral primary osteoarthritis, right knee: Secondary | ICD-10-CM | POA: Diagnosis not present

## 2016-10-30 ENCOUNTER — Telehealth: Payer: Self-pay

## 2016-10-30 DIAGNOSIS — J439 Emphysema, unspecified: Secondary | ICD-10-CM | POA: Diagnosis not present

## 2016-10-30 DIAGNOSIS — M25561 Pain in right knee: Secondary | ICD-10-CM | POA: Diagnosis not present

## 2016-10-30 DIAGNOSIS — I110 Hypertensive heart disease with heart failure: Secondary | ICD-10-CM | POA: Diagnosis not present

## 2016-10-30 DIAGNOSIS — I509 Heart failure, unspecified: Secondary | ICD-10-CM | POA: Diagnosis not present

## 2016-10-30 DIAGNOSIS — M25562 Pain in left knee: Secondary | ICD-10-CM | POA: Diagnosis not present

## 2016-10-30 DIAGNOSIS — E1142 Type 2 diabetes mellitus with diabetic polyneuropathy: Secondary | ICD-10-CM | POA: Diagnosis not present

## 2016-10-30 NOTE — Telephone Encounter (Signed)
Copied from [place] #[id number]. Topic: Referral - Status >> Oct 28, 2016  3:12 PM Robina Ade, Leone Payor wrote: Alla Feeling from [facility] called and said that she is patients speech therapist and was not able to see patient today because she could not reach patient or daughter on their phone to come out to her home. She wanted to let the provider know about this. Please call her back if any questions, thanks.

## 2016-10-30 NOTE — Telephone Encounter (Signed)
Noted thank you

## 2016-11-03 DIAGNOSIS — E1169 Type 2 diabetes mellitus with other specified complication: Secondary | ICD-10-CM | POA: Insufficient documentation

## 2016-11-03 DIAGNOSIS — E1165 Type 2 diabetes mellitus with hyperglycemia: Secondary | ICD-10-CM | POA: Diagnosis not present

## 2016-11-03 DIAGNOSIS — I10 Essential (primary) hypertension: Secondary | ICD-10-CM | POA: Diagnosis not present

## 2016-11-03 DIAGNOSIS — Z794 Long term (current) use of insulin: Secondary | ICD-10-CM | POA: Diagnosis not present

## 2016-11-03 DIAGNOSIS — E1159 Type 2 diabetes mellitus with other circulatory complications: Secondary | ICD-10-CM | POA: Diagnosis not present

## 2016-11-03 DIAGNOSIS — E785 Hyperlipidemia, unspecified: Secondary | ICD-10-CM | POA: Diagnosis not present

## 2016-11-04 DIAGNOSIS — I110 Hypertensive heart disease with heart failure: Secondary | ICD-10-CM | POA: Diagnosis not present

## 2016-11-04 DIAGNOSIS — I509 Heart failure, unspecified: Secondary | ICD-10-CM | POA: Diagnosis not present

## 2016-11-04 DIAGNOSIS — E1142 Type 2 diabetes mellitus with diabetic polyneuropathy: Secondary | ICD-10-CM | POA: Diagnosis not present

## 2016-11-04 DIAGNOSIS — M25561 Pain in right knee: Secondary | ICD-10-CM | POA: Diagnosis not present

## 2016-11-04 DIAGNOSIS — J439 Emphysema, unspecified: Secondary | ICD-10-CM | POA: Diagnosis not present

## 2016-11-04 DIAGNOSIS — M25562 Pain in left knee: Secondary | ICD-10-CM | POA: Diagnosis not present

## 2016-11-05 DIAGNOSIS — I110 Hypertensive heart disease with heart failure: Secondary | ICD-10-CM | POA: Diagnosis not present

## 2016-11-05 DIAGNOSIS — E1142 Type 2 diabetes mellitus with diabetic polyneuropathy: Secondary | ICD-10-CM | POA: Diagnosis not present

## 2016-11-05 DIAGNOSIS — I509 Heart failure, unspecified: Secondary | ICD-10-CM | POA: Diagnosis not present

## 2016-11-05 DIAGNOSIS — M25562 Pain in left knee: Secondary | ICD-10-CM | POA: Diagnosis not present

## 2016-11-05 DIAGNOSIS — J439 Emphysema, unspecified: Secondary | ICD-10-CM | POA: Diagnosis not present

## 2016-11-05 DIAGNOSIS — M1711 Unilateral primary osteoarthritis, right knee: Secondary | ICD-10-CM | POA: Diagnosis not present

## 2016-11-05 DIAGNOSIS — M25561 Pain in right knee: Secondary | ICD-10-CM | POA: Diagnosis not present

## 2016-11-06 DIAGNOSIS — M25561 Pain in right knee: Secondary | ICD-10-CM | POA: Diagnosis not present

## 2016-11-06 DIAGNOSIS — E1142 Type 2 diabetes mellitus with diabetic polyneuropathy: Secondary | ICD-10-CM | POA: Diagnosis not present

## 2016-11-06 DIAGNOSIS — I509 Heart failure, unspecified: Secondary | ICD-10-CM | POA: Diagnosis not present

## 2016-11-06 DIAGNOSIS — J439 Emphysema, unspecified: Secondary | ICD-10-CM | POA: Diagnosis not present

## 2016-11-06 DIAGNOSIS — I110 Hypertensive heart disease with heart failure: Secondary | ICD-10-CM | POA: Diagnosis not present

## 2016-11-06 DIAGNOSIS — M25562 Pain in left knee: Secondary | ICD-10-CM | POA: Diagnosis not present

## 2016-11-07 DIAGNOSIS — M25562 Pain in left knee: Secondary | ICD-10-CM | POA: Diagnosis not present

## 2016-11-07 DIAGNOSIS — I509 Heart failure, unspecified: Secondary | ICD-10-CM | POA: Diagnosis not present

## 2016-11-07 DIAGNOSIS — J439 Emphysema, unspecified: Secondary | ICD-10-CM | POA: Diagnosis not present

## 2016-11-07 DIAGNOSIS — E1142 Type 2 diabetes mellitus with diabetic polyneuropathy: Secondary | ICD-10-CM | POA: Diagnosis not present

## 2016-11-07 DIAGNOSIS — I110 Hypertensive heart disease with heart failure: Secondary | ICD-10-CM | POA: Diagnosis not present

## 2016-11-07 DIAGNOSIS — M25561 Pain in right knee: Secondary | ICD-10-CM | POA: Diagnosis not present

## 2016-11-11 DIAGNOSIS — E1121 Type 2 diabetes mellitus with diabetic nephropathy: Secondary | ICD-10-CM | POA: Diagnosis not present

## 2016-11-11 DIAGNOSIS — Z794 Long term (current) use of insulin: Secondary | ICD-10-CM | POA: Diagnosis not present

## 2016-11-11 DIAGNOSIS — J9611 Chronic respiratory failure with hypoxia: Secondary | ICD-10-CM | POA: Diagnosis not present

## 2016-11-11 DIAGNOSIS — E1151 Type 2 diabetes mellitus with diabetic peripheral angiopathy without gangrene: Secondary | ICD-10-CM | POA: Diagnosis not present

## 2016-11-11 DIAGNOSIS — I110 Hypertensive heart disease with heart failure: Secondary | ICD-10-CM | POA: Diagnosis not present

## 2016-11-11 DIAGNOSIS — F329 Major depressive disorder, single episode, unspecified: Secondary | ICD-10-CM | POA: Diagnosis not present

## 2016-11-11 DIAGNOSIS — G8929 Other chronic pain: Secondary | ICD-10-CM | POA: Diagnosis not present

## 2016-11-11 DIAGNOSIS — M25562 Pain in left knee: Secondary | ICD-10-CM | POA: Diagnosis not present

## 2016-11-11 DIAGNOSIS — R413 Other amnesia: Secondary | ICD-10-CM | POA: Diagnosis not present

## 2016-11-11 DIAGNOSIS — I509 Heart failure, unspecified: Secondary | ICD-10-CM | POA: Diagnosis not present

## 2016-11-11 DIAGNOSIS — R471 Dysarthria and anarthria: Secondary | ICD-10-CM | POA: Diagnosis not present

## 2016-11-11 DIAGNOSIS — J432 Centrilobular emphysema: Secondary | ICD-10-CM | POA: Diagnosis not present

## 2016-11-11 DIAGNOSIS — I2510 Atherosclerotic heart disease of native coronary artery without angina pectoris: Secondary | ICD-10-CM | POA: Diagnosis not present

## 2016-11-11 DIAGNOSIS — Z86718 Personal history of other venous thrombosis and embolism: Secondary | ICD-10-CM | POA: Diagnosis not present

## 2016-11-11 DIAGNOSIS — D509 Iron deficiency anemia, unspecified: Secondary | ICD-10-CM | POA: Diagnosis not present

## 2016-11-11 DIAGNOSIS — G4730 Sleep apnea, unspecified: Secondary | ICD-10-CM | POA: Diagnosis not present

## 2016-11-11 DIAGNOSIS — E1142 Type 2 diabetes mellitus with diabetic polyneuropathy: Secondary | ICD-10-CM | POA: Diagnosis not present

## 2016-11-11 DIAGNOSIS — M1711 Unilateral primary osteoarthritis, right knee: Secondary | ICD-10-CM | POA: Diagnosis not present

## 2016-11-11 DIAGNOSIS — M47892 Other spondylosis, cervical region: Secondary | ICD-10-CM | POA: Diagnosis not present

## 2016-11-11 DIAGNOSIS — I480 Paroxysmal atrial fibrillation: Secondary | ICD-10-CM | POA: Diagnosis not present

## 2016-11-11 DIAGNOSIS — Z853 Personal history of malignant neoplasm of breast: Secondary | ICD-10-CM | POA: Diagnosis not present

## 2016-11-11 DIAGNOSIS — Z87891 Personal history of nicotine dependence: Secondary | ICD-10-CM | POA: Diagnosis not present

## 2016-11-11 DIAGNOSIS — F419 Anxiety disorder, unspecified: Secondary | ICD-10-CM | POA: Diagnosis not present

## 2016-11-11 DIAGNOSIS — G2581 Restless legs syndrome: Secondary | ICD-10-CM | POA: Diagnosis not present

## 2016-11-12 DIAGNOSIS — M1711 Unilateral primary osteoarthritis, right knee: Secondary | ICD-10-CM | POA: Diagnosis not present

## 2016-11-12 DIAGNOSIS — J432 Centrilobular emphysema: Secondary | ICD-10-CM | POA: Diagnosis not present

## 2016-11-12 DIAGNOSIS — E1142 Type 2 diabetes mellitus with diabetic polyneuropathy: Secondary | ICD-10-CM | POA: Diagnosis not present

## 2016-11-12 DIAGNOSIS — I110 Hypertensive heart disease with heart failure: Secondary | ICD-10-CM | POA: Diagnosis not present

## 2016-11-12 DIAGNOSIS — M47892 Other spondylosis, cervical region: Secondary | ICD-10-CM | POA: Diagnosis not present

## 2016-11-12 DIAGNOSIS — G8929 Other chronic pain: Secondary | ICD-10-CM | POA: Diagnosis not present

## 2016-11-13 ENCOUNTER — Other Ambulatory Visit: Payer: Self-pay | Admitting: Family Medicine

## 2016-11-13 ENCOUNTER — Encounter: Payer: Self-pay | Admitting: Psychology

## 2016-11-13 DIAGNOSIS — R2689 Other abnormalities of gait and mobility: Secondary | ICD-10-CM

## 2016-11-13 NOTE — Progress Notes (Deleted)
NEUROPSYCHOLOGICAL INTERVIEW (CPT: [id number])  Name: Bonnie Henry Date of Birth: 1940/09/22 Date of Interview: 11/13/2016  Reason for Referral:  Bonnie Henry is a 76 y.o. female who is referred for neuropsychological evaluation by Dr. Rosalin Hawking of [facility] due to concerns about cognitive impairment. This patient is accompanied in the office by her *** who supplements the history.  History of Presenting Problem:  Xu 08/25/2016 Episodic Disorientation to place and dysarthria (intermittent garbled speech) are related to tiredness/physical condition decline. MRI showed brain atrophy and WM changes. 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  Xu 10/08/2016 Worsening sx, hallucinations, depression, paranoid behavior, anxiety, increased memory loss (forgetting names of children) Sx are more consistent with depression, decreased brain reserve and MCI. Daughter no longer leaving her alone. PCP referred to psychiatry and psychology  Repeat MRI with MRA 08/22/2016: IMPRESSION: Brain MRI: 1. No acute finding or change from 04/25/2015. 2. Extensive chronic small vessel ischemia in the cerebral white matter. 3. Atrophy and ventriculomegaly. Intracranial MRA: Negative.  Dr. Posey Pronto neurology consultation 04/13/2015: MoCA 18/30 Pt declined neuropsych testing Followed up with Dr. Posey Pronto on 07/12/2015, Dr. Posey Pronto noted she is on a number of medications which can contribute to cog dysfx and formal neuropsych testing would help determine if her cog changes are due to dementia, mood or polypharmacy.     Onset/Course  Upon direct questioning, the patient/caregiver reported:   Forgetting recent conversations/events:  Repeating statements/questions: Misplacing/losing items: Forgetting appointments or other obligations: Forgetting to take medications:  Difficulty concentrating: Starting but not finishing tasks: Distracted easily: Processing  information more slowly:  Word-finding difficulty: Word substitutions: Writing difficulty: Spelling difficulty: Comprehension difficulty:  Getting lost when driving: Making wrong turns when driving: Uncertain about directions when driving or passenger:    Family neuro hx: Any family hx dementia?   Current Functioning: Work:  Complex ADLs Driving: Medication management: Management of finances: Appointments: Cooking:     Medical/Physical complaints:  Any hx of stroke/TIA, MI, LOC/TBI, Sz? Hx falls?  Balance, probs walking? Sleep: Insomnia? OSA? CPAP? REM sleep beh sx? Visual illusions/hallucinations? Appetite/Nutrition/Weight changes       Current mood:  Depressed mood Anxiety Stress  Behavioral disturbance/Personality change  Suicidal Ideation/Intention:   Psychiatric History: History of depression, anxiety, other MH disorder: History of MH treatment: History of SI: History of substance dependence/treatment:   Social History: Born/Raised:  Education: Occupational history: Marital history: Children: Alcohol:  Tobacco: SA:  Medical History: Past Medical History:  Diagnosis Date  . Anemia   . Anxiety   . Arthritis    "joints ache all over" . osteoarthritis  . Asthma   . Atrial fibrillation ([place])   . Breast cancer, left breast (HCC)    S/P mastectomy  . CAD (coronary artery disease)   . CHF (congestive heart failure) ([place])   . Chronic back pain   . Constipation 03/04/2015  . COPD (chronic obstructive pulmonary disease) ([place])   . Depression, major   . Diabetes mellitus without complication ([place])   . DVT (deep venous thrombosis) ([place])   . GERD (gastroesophageal reflux disease)   . Headache   . Heart murmur   . Hepatitis    HEP "C"  . History of blood transfusion    "when I had my mastectomy"  . History of hiatal hernia   . History of right-sided carotid endarterectomy 06/10/2015  . Hypercholesteremia   . Hypertension   .  Morbid obesity ([place])   . Myocardial  infarction ([place])   . Neuropathy   . Peripheral vascular disease ([place])   . Post-surgical hypothyroidism   . Restless leg syndrome   . Shortness of breath dyspnea   . Sleep apnea    "2015 wore mask; lost weight; told didn't need mask anymore" (02/23/2014)  . Type II diabetes mellitus (HCC)       Current Medications:  Outpatient Encounter Medications as of 11/13/2016  Medication Sig  . acetaminophen (TYLENOL) 500 MG tablet Take 2 tablets (1,000 mg total) by mouth every 6 (six) hours as needed for moderate pain.  Marland Kitchen albuterol (ACCUNEB) 0.63 MG/3ML nebulizer solution Take 1 ampule by nebulization every 6 (six) hours as needed for wheezing or shortness of breath.   Marland Kitchen albuterol (PROAIR HFA) 108 (90 BASE) MCG/ACT inhaler Inhale 2 puffs into the lungs every 4 (four) hours as needed for wheezing or shortness of breath.   . Ascorbic Acid (VITAMIN C PO) Take 1 tablet by mouth 3 (three) times a week.   Marland Kitchen aspirin EC 81 MG tablet Take 81 mg by mouth daily.  Marland Kitchen atorvastatin (LIPITOR) 80 MG tablet Take 1 tablet (80 mg total) by mouth at bedtime.  . budesonide (PULMICORT) 0.5 MG/2ML nebulizer solution Take 0.5 mg by nebulization 2 (two) times daily.   . bumetanide (BUMEX) 0.5 MG tablet Take 0.5 mg by mouth as needed.   . busPIRone (BUSPAR) 15 MG tablet TAKE ONE TABLET BY MOUTH TWICE DAILY.  . carvedilol (COREG) 12.5 MG tablet Take 12.5 mg by mouth 2 (two) times daily with a meal.   . DULoxetine (CYMBALTA) 60 MG capsule Take 1 capsule (60 mg total) by mouth daily.  . fluticasone (FLONASE) 50 MCG/ACT nasal spray Place 2 sprays into both nostrils as needed for allergies.   . formoterol (PERFOROMIST) 20 MCG/2ML nebulizer solution Inhale 20 mcg into the lungs 2 (two) times daily.   Marland Kitchen gabapentin (NEURONTIN) 300 MG capsule TAKE ONE CAPSULE BY MOUTH TWICE DAILY (Patient taking differently: TAKE 600 mg BY MOUTH TWICE DAILY)  . Hypromellose (ARTIFICIAL TEARS OP) Place 1 drop into  both eyes as needed (dry eyes).   . insulin NPH Human (HUMULIN N,NOVOLIN N) 100 UNIT/ML injection Inject 24 Units into the skin at bedtime.   . insulin regular (NOVOLIN R,HUMULIN R) 100 units/mL injection Inject 3-20 Units into the skin 3 (three) times daily before meals. Based on sliding scale   . levothyroxine (SYNTHROID, LEVOTHROID) 100 MCG tablet Take 1 tablet (100 mcg total) by mouth every other day. (alternate with the 112 mcg strength) (Patient taking differently: Take 100 mcg by mouth See admin instructions. Take 1 tablet (100 mcg) by mouth before breakfast Monday thru Thursday (take 112 mcg other days))  . levothyroxine (SYNTHROID, LEVOTHROID) 112 MCG tablet Take 1 tablet (112 mcg total) by mouth every other day. (alternate with the 100 mcg strength) (Patient taking differently: Take 112 mcg by mouth See admin instructions. Take 1 tablet (112 mcg) by mouth before breakfast on Friday, Saturday, Sunday (take 100 mcg other days))  . linagliptin (TRADJENTA) 5 MG TABS tablet Take 1 tablet (5 mg total) by mouth daily.  . meclizine (ANTIVERT) 25 MG tablet Take 0.5-1 tablets (12.5-25 mg total) by mouth 3 (three) times daily as needed for dizziness.  . montelukast (SINGULAIR) 10 MG tablet Take 1 tablet (10 mg total) by mouth daily.  . nitroGLYCERIN (NITROSTAT) 0.4 MG SL tablet Place 0.4 mg under the tongue every 5 (five) minutes as needed for chest pain.  Marland Kitchen  Omega-3 Fatty Acids (FISH OIL) 1200 MG CAPS Take 1 capsule by mouth 2 (two) times a week.   . OXYGEN Inhale 2 L into the lungs continuous.  . pantoprazole (PROTONIX) 20 MG tablet TAKE ONE TABLET BY MOUTH TWICE DAILY  . ranitidine (ZANTAC) 300 MG tablet Take 1 tablet (300 mg total) by mouth at bedtime.  Marland Kitchen rOPINIRole (REQUIP) 1 MG tablet Take 1 tablet (1 mg total) by mouth 3 (three) times daily.  Marland Kitchen spironolactone (ALDACTONE) 25 MG tablet Take 25 mg by mouth daily.  Marland Kitchen tiZANidine (ZANAFLEX) 2 MG tablet TAKE 1 TABLET BY MOUTH AT BEDTIME   No  facility-administered encounter medications on file as of 11/13/2016.      Behavioral Observations:   Appearance: Neatly, casually and appropriately dressed and groomed*** Gait: Ambulated independently, no gross abnormalities observed*** Speech: Fluent; normal rate, rhythm and volume. *** word finding difficulty. Thought process: Linear, goal directed*** Affect: Full, anxious*** Interpersonal: Pleasant, appropriate***   TESTING: There is medical necessity to proceed with neuropsychological assessment as the results will be used to aid in differential diagnosis and clinical decision-making and to inform specific treatment recommendations. Per the patient, *** and medical records reviewed, there has been a change in cognitive functioning and a reasonable suspicion of dementia***.  Following the clinical interview, the patient completed a full battery of neuropsychological testing with my psychometrician under my supervision.   PLAN: The patient will return to see me for a follow-up session at which time her test performances and my impressions and treatment recommendations will be reviewed in detail.  Full report to follow.  PLAN: The patient will return for a full battery of neuropsychological testing with a psychometrician under my supervision. Education regarding testing procedures was provided. Subsequently, the patient will see this provider for a follow-up session at which time her test performances and my impressions and treatment recommendations will be reviewed in detail.  Full neuropsychological evaluation report to follow.

## 2016-11-14 DIAGNOSIS — E1142 Type 2 diabetes mellitus with diabetic polyneuropathy: Secondary | ICD-10-CM | POA: Diagnosis not present

## 2016-11-14 DIAGNOSIS — I110 Hypertensive heart disease with heart failure: Secondary | ICD-10-CM | POA: Diagnosis not present

## 2016-11-14 DIAGNOSIS — J432 Centrilobular emphysema: Secondary | ICD-10-CM | POA: Diagnosis not present

## 2016-11-14 DIAGNOSIS — M47892 Other spondylosis, cervical region: Secondary | ICD-10-CM | POA: Diagnosis not present

## 2016-11-14 DIAGNOSIS — M1711 Unilateral primary osteoarthritis, right knee: Secondary | ICD-10-CM | POA: Diagnosis not present

## 2016-11-14 DIAGNOSIS — G8929 Other chronic pain: Secondary | ICD-10-CM | POA: Diagnosis not present

## 2016-11-18 ENCOUNTER — Telehealth: Payer: Self-pay | Admitting: Family Medicine

## 2016-11-18 DIAGNOSIS — M1711 Unilateral primary osteoarthritis, right knee: Secondary | ICD-10-CM | POA: Diagnosis not present

## 2016-11-18 DIAGNOSIS — M47892 Other spondylosis, cervical region: Secondary | ICD-10-CM | POA: Diagnosis not present

## 2016-11-18 DIAGNOSIS — G8929 Other chronic pain: Secondary | ICD-10-CM | POA: Diagnosis not present

## 2016-11-18 DIAGNOSIS — I110 Hypertensive heart disease with heart failure: Secondary | ICD-10-CM | POA: Diagnosis not present

## 2016-11-18 DIAGNOSIS — J432 Centrilobular emphysema: Secondary | ICD-10-CM | POA: Diagnosis not present

## 2016-11-18 DIAGNOSIS — E1142 Type 2 diabetes mellitus with diabetic polyneuropathy: Secondary | ICD-10-CM | POA: Diagnosis not present

## 2016-11-18 NOTE — Telephone Encounter (Signed)
Yes, please schedule appt or refer to urgent care

## 2016-11-18 NOTE — Telephone Encounter (Signed)
Copied from [place]. Topic: Quick Communication - See Telephone Encounter >> Nov 18, 2016 11:46 AM Cleaster Corin, NT wrote: CRM for notification. See Telephone encounter for: 11/18/16. Alisha(physical therapy asst.) from [facility] called to let DR. Lada know that Inez Catalina has had a fall at home and she is compiling about bi lateral knee pain (non weight bearing 6 out of 10 (weight bearing 7 out of to a 10/10) reported fall 2 days ago 11/16/16 aches and pain bi-lateral shoulders down to arms (8/10) right leg also feels like its going to give out. Any questions please call Alisha at [phone] wanting to report fall and also needing to know if pt. Needs to set up appt.

## 2016-11-19 NOTE — Telephone Encounter (Signed)
Loretha Brasil PT assistant

## 2016-11-19 NOTE — Telephone Encounter (Signed)
Called Alisha PT assistant, informed her that per Dr.Lada the pt needs to be seen in our office or by urgent care. Lars Mage asks that I call pt and recommend this as she believes the pt may be more open to coming in if she hears it from someone other than her. Told Alisha that I would gladly call pt.    Called pt, no answer. LM for pt informing her of the need to be seen by our office or urgent care for recent fall. CRM created. Pt to call back for questions or concerns.

## 2016-11-20 DIAGNOSIS — I110 Hypertensive heart disease with heart failure: Secondary | ICD-10-CM | POA: Diagnosis not present

## 2016-11-20 DIAGNOSIS — E1142 Type 2 diabetes mellitus with diabetic polyneuropathy: Secondary | ICD-10-CM | POA: Diagnosis not present

## 2016-11-20 DIAGNOSIS — M47892 Other spondylosis, cervical region: Secondary | ICD-10-CM | POA: Diagnosis not present

## 2016-11-20 DIAGNOSIS — M1711 Unilateral primary osteoarthritis, right knee: Secondary | ICD-10-CM | POA: Diagnosis not present

## 2016-11-20 DIAGNOSIS — J432 Centrilobular emphysema: Secondary | ICD-10-CM | POA: Diagnosis not present

## 2016-11-20 DIAGNOSIS — G8929 Other chronic pain: Secondary | ICD-10-CM | POA: Diagnosis not present

## 2016-11-21 ENCOUNTER — Ambulatory Visit (INDEPENDENT_AMBULATORY_CARE_PROVIDER_SITE_OTHER): Payer: Medicare Other | Admitting: Family Medicine

## 2016-11-21 ENCOUNTER — Ambulatory Visit
Admission: RE | Admit: 2016-11-21 | Discharge: 2016-11-21 | Disposition: A | Discharge status: Home / Self Care | Payer: Medicare Other | Source: Ambulatory Visit | Attending: Family Medicine | Admitting: Family Medicine

## 2016-11-21 ENCOUNTER — Encounter: Payer: Self-pay | Admitting: Family Medicine

## 2016-11-21 VITALS — BP 132/64 | HR 82 | Temp 98.0°F | Resp 18 | Wt 205.5 lb

## 2016-11-21 DIAGNOSIS — M25461 Effusion, right knee: Secondary | ICD-10-CM | POA: Insufficient documentation

## 2016-11-21 DIAGNOSIS — M47816 Spondylosis without myelopathy or radiculopathy, lumbar region: Secondary | ICD-10-CM

## 2016-11-21 DIAGNOSIS — M545 Low back pain, unspecified: Secondary | ICD-10-CM

## 2016-11-21 DIAGNOSIS — J449 Chronic obstructive pulmonary disease, unspecified: Secondary | ICD-10-CM | POA: Diagnosis not present

## 2016-11-21 DIAGNOSIS — I10 Essential (primary) hypertension: Secondary | ICD-10-CM | POA: Diagnosis not present

## 2016-11-21 DIAGNOSIS — I6523 Occlusion and stenosis of bilateral carotid arteries: Secondary | ICD-10-CM

## 2016-11-21 DIAGNOSIS — G319 Degenerative disease of nervous system, unspecified: Secondary | ICD-10-CM

## 2016-11-21 DIAGNOSIS — E0500 Thyrotoxicosis with diffuse goiter without thyrotoxic crisis or storm: Secondary | ICD-10-CM | POA: Diagnosis not present

## 2016-11-21 DIAGNOSIS — M898X5 Other specified disorders of bone, thigh: Secondary | ICD-10-CM

## 2016-11-21 DIAGNOSIS — Z5181 Encounter for therapeutic drug level monitoring: Secondary | ICD-10-CM | POA: Diagnosis not present

## 2016-11-21 DIAGNOSIS — I700 Atherosclerosis of aorta: Secondary | ICD-10-CM | POA: Insufficient documentation

## 2016-11-21 DIAGNOSIS — M25561 Pain in right knee: Secondary | ICD-10-CM | POA: Diagnosis not present

## 2016-11-21 DIAGNOSIS — I2510 Atherosclerotic heart disease of native coronary artery without angina pectoris: Secondary | ICD-10-CM

## 2016-11-21 DIAGNOSIS — R2689 Other abnormalities of gait and mobility: Secondary | ICD-10-CM | POA: Diagnosis not present

## 2016-11-21 DIAGNOSIS — Z9861 Coronary angioplasty status: Secondary | ICD-10-CM

## 2016-11-21 DIAGNOSIS — I739 Peripheral vascular disease, unspecified: Secondary | ICD-10-CM

## 2016-11-21 DIAGNOSIS — J849 Interstitial pulmonary disease, unspecified: Secondary | ICD-10-CM | POA: Diagnosis not present

## 2016-11-21 DIAGNOSIS — E0842 Diabetes mellitus due to underlying condition with diabetic polyneuropathy: Secondary | ICD-10-CM

## 2016-11-21 DIAGNOSIS — M79651 Pain in right thigh: Secondary | ICD-10-CM | POA: Diagnosis not present

## 2016-11-21 DIAGNOSIS — J9611 Chronic respiratory failure with hypoxia: Secondary | ICD-10-CM | POA: Diagnosis not present

## 2016-11-21 DIAGNOSIS — R296 Repeated falls: Secondary | ICD-10-CM

## 2016-11-21 MED ORDER — NITROGLYCERIN 0.4 MG SL SUBL: 0.4000 mg | SUBLINGUAL_TABLET | SUBLINGUAL | 5 refills | Status: AC | PRN

## 2016-11-21 MED ORDER — OXYCODONE-ACETAMINOPHEN 5-325 MG PO TABS: 0.5000 | ORAL_TABLET | Freq: Four times a day (QID) | ORAL | 0 refills | Status: DC | PRN

## 2016-11-21 NOTE — Patient Instructions (Addendum)
Please go across the street for xrays Staff will be working to help find you therapy and skilled services Practice good fall precautions   Fall Prevention in the Home Falls can cause injuries. They can happen to people of all ages. There are many things you can do to make your home safe and to help prevent falls. What can I do on the outside of my home?  Regularly fix the edges of walkways and driveways and fix any cracks.  Remove anything that might make you trip as you walk through a door, such as a raised step or threshold.  Trim any bushes or trees on the path to your home.  Use bright outdoor lighting.  Clear any walking paths of anything that might make someone trip, such as rocks or tools.  Regularly check to see if handrails are loose or broken. Make sure that both sides of any steps have handrails.  Any raised decks and porches should have guardrails on the edges.  Have any leaves, snow, or ice cleared regularly.  Use sand or salt on walking paths during winter.  Clean up any spills in your garage right away. This includes oil or grease spills. What can I do in the bathroom?  Use night lights.  Install grab bars by the toilet and in the tub and shower. Do not use towel bars as grab bars.  Use non-skid mats or decals in the tub or shower.  If you need to sit down in the shower, use a plastic, non-slip stool.  Keep the floor dry. Clean up any water that spills on the floor as soon as it happens.  Remove soap buildup in the tub or shower regularly.  Attach bath mats securely with double-sided non-slip rug tape.  Do not have throw rugs and other things on the floor that can make you trip. What can I do in the bedroom?  Use night lights.  Make sure that you have a light by your bed that is easy to reach.  Do not use any sheets or blankets that are too big for your bed. They should not hang down onto the floor.  Have a firm chair that has side arms. You can use  this for support while you get dressed.  Do not have throw rugs and other things on the floor that can make you trip. What can I do in the kitchen?  Clean up any spills right away.  Avoid walking on wet floors.  Keep items that you use a lot in easy-to-reach places.  If you need to reach something above you, use a strong step stool that has a grab bar.  Keep electrical cords out of the way.  Do not use floor polish or wax that makes floors slippery. If you must use wax, use non-skid floor wax.  Do not have throw rugs and other things on the floor that can make you trip. What can I do with my stairs?  Do not leave any items on the stairs.  Make sure that there are handrails on both sides of the stairs and use them. Fix handrails that are broken or loose. Make sure that handrails are as long as the stairways.  Check any carpeting to make sure that it is firmly attached to the stairs. Fix any carpet that is loose or worn.  Avoid having throw rugs at the top or bottom of the stairs. If you do have throw rugs, attach them to the floor with carpet  tape.  Make sure that you have a light switch at the top of the stairs and the bottom of the stairs. If you do not have them, ask someone to add them for you. What else can I do to help prevent falls?  Wear shoes that: ? Do not have high heels. ? Have rubber bottoms. ? Are comfortable and fit you well. ? Are closed at the toe. Do not wear sandals.  If you use a stepladder: ? Make sure that it is fully opened. Do not climb a closed stepladder. ? Make sure that both sides of the stepladder are locked into place. ? Ask someone to hold it for you, if possible.  Clearly mark and make sure that you can see: ? Any grab bars or handrails. ? First and last steps. ? Where the edge of each step is.  Use tools that help you move around (mobility aids) if they are needed. These include: ? Canes. ? Walkers. ? Scooters. ? Crutches.  Turn on  the lights when you go into a dark area. Replace any light bulbs as soon as they burn out.  Set up your furniture so you have a clear path. Avoid moving your furniture around.  If any of your floors are uneven, fix them.  If there are any pets around you, be aware of where they are.  Review your medicines with your doctor. Some medicines can make you feel dizzy. This can increase your chance of falling. Ask your doctor what other things that you can do to help prevent falls. This information is not intended to replace advice given to you by your health care provider. Make sure you discuss any questions you have with your health care provider. Document Released: 10/19/2008 Document Revised: 05/31/2015 Document Reviewed: 01/27/2014 Elsevier Interactive Patient Education  [employer].

## 2016-11-21 NOTE — Assessment & Plan Note (Signed)
MRI from Sept 2016; now with worsening pain; falls; will address with pain medicine; injections did not really help; will apply for SNF with physical therapy

## 2016-11-21 NOTE — Assessment & Plan Note (Signed)
With increased falling; weakness, peripheral neuropathy, brain atrophy, arthritis; using walker and still falling; refer to skilled facility for physical therapy

## 2016-11-21 NOTE — Assessment & Plan Note (Signed)
Stable, left carotid endarterectomy

## 2016-11-21 NOTE — Assessment & Plan Note (Signed)
Check labs 

## 2016-11-21 NOTE — Assessment & Plan Note (Signed)
Check TSH 

## 2016-11-21 NOTE — Assessment & Plan Note (Signed)
Seeing pulmonologist; on chronic supplemental oxygen

## 2016-11-21 NOTE — Assessment & Plan Note (Signed)
controlled 

## 2016-11-21 NOTE — Progress Notes (Signed)
BP 132/64   Pulse 82   Temp 98 F (36.7 C) (Oral)   Resp 18   Wt 205 lb 8 oz (93.2 kg)   SpO2 93%   BMI 41.51 kg/m    Subjective:    Patient ID: Bonnie Henry, female    DOB: [birth year], 76 y.o.   MRN: [id number]  HPI: Bonnie Henry is a 76 y.o. female  Chief Complaint  Patient presents with  . Follow-up    HPI  Patient is here with her daughter She is having knee pain in the right knee She fell over her walker last weekend, bruised the right thigh; hit her walker'could not bear weight at first Had another fall and hit the right knee She saw the orthopaedist and had the shots in the knee (series); daughter says the knee is actually worse than it was; no fevers after injections [place] again and came down on her knees and hands Four falls in the last month Memory issues are worsenings; headaches are bad, more often She is supposed to see Dr. Darlina Rumpf (neurologist) soon; missed last appt Lives in a trailer She is having swelling in her hands, bases of the thumbs; veins are popping; very tender; not hot or red; bothering her before the fall Taking tylenol for pain and  Type 2 diabetes; on two types of insulin right now; sugars running 100 something to 300 something; seeing endocrinologist; prednisone has run her sugars up She has been on oxycodone and hydrocodone and tramadol all in the past She has been on oxycodone most recently; it messes with her mental status Tried the shots in the back Home environment would not likely support a scooter or motorized wheelchair I planted the seed of assisted living or nursing home She has weakness; she has shortness of breath; DOE Check FSBS 3x a day  MRI lumbar spine Sept 2016:  IMPRESSION: Non-compressive disc bulges at L3-4 and above.  Facet arthropathy at L4-5 and L5-S1. Anterolisthesis of 3 mm at L4-5 and 4 mm at L5-S1. This could worsen with standing or flexion, particularly at the L4-5 level where the facet joints  contain some fluid. There is mild narrowing of the lateral recesses and foramina at L4-5 which could worsen with standing or flexion.   Electronically Signed   By: Nelson Chimes M.D.   On: 09/18/2014 13:58   Depression screen [facility] 2/9 11/21/2016 10/10/2016 08/22/2016 08/06/2016 05/02/2016  Decreased Interest 0 0 0 0 0  Down, Depressed, Hopeless 0 1 1 1 1   PHQ - 2 Score 0 1 1 1 1   Altered sleeping - - - - -  Tired, decreased energy - - - - -  Change in appetite - - - - -  Feeling bad or failure about yourself  - - - - -  Trouble concentrating - - - - -  Moving slowly or fidgety/restless - - - - -  Suicidal thoughts - - - - -  PHQ-9 Score - - - - -  Difficult doing work/chores - - - - -    Relevant past medical, surgical, family and social history reviewed Past Medical History:  Diagnosis Date  . Anemia   . Anxiety   . Arthritis    "joints ache all over" . osteoarthritis  . Asthma   . Atrial fibrillation ([place])   . Breast cancer, left breast (HCC)    S/P mastectomy  . CAD (coronary artery disease)   . CHF (congestive heart failure) ([place])   .  Chronic back pain   . Constipation 03/04/2015  . COPD (chronic obstructive pulmonary disease) ([place])   . Depression, major   . Diabetes mellitus without complication ([place])   . DVT (deep venous thrombosis) ([place])   . GERD (gastroesophageal reflux disease)   . Headache   . Heart murmur   . Hepatitis    HEP "C"  . History of blood transfusion    "when I had my mastectomy"  . History of hiatal hernia   . History of right-sided carotid endarterectomy 06/10/2015  . Hypercholesteremia   . Hypertension   . Morbid obesity ([place])   . Myocardial infarction ([place])   . Neuropathy   . Peripheral vascular disease ([place])   . Post-surgical hypothyroidism   . Restless leg syndrome   . Shortness of breath dyspnea   . Sleep apnea    "2015 wore mask; lost weight; told didn't need mask anymore" (02/23/2014)  . Type II diabetes mellitus ([place])    Past  Surgical History:  Procedure Laterality Date  . ARTERY BIOPSY Right 09/29/2014   Procedure: BIOPSY TEMPORAL ARTERY;  Surgeon: Angelia Mould, MD;  Location: [place];  Service: Vascular;  Laterality: Right;  . BARTHOLIN CYST MARSUPIALIZATION    . BREAST BIOPSY Left    Mastectomy  . CARDIAC CATHETERIZATION  2000's  . CAROTID ENDARTERECTOMY Left 2009  . CATARACT EXTRACTION W/ INTRAOCULAR LENS  IMPLANT, BILATERAL Bilateral ~ 2011  . CORONARY ANGIOPLASTY WITH STENT PLACEMENT  1990's X 2   "2; 1"  . ENDARTERECTOMY Right 05/16/2015   Procedure: ENDARTERECTOMY CAROTID;  Surgeon: Katha Cabal, MD;  Location: [facility];  Service: Vascular;  Laterality: Right;  . ESOPHAGOGASTRODUODENOSCOPY (EGD) WITH PROPOFOL N/A 12/12/2014   Procedure: ESOPHAGOGASTRODUODENOSCOPY (EGD) WITH PROPOFOL;  Surgeon: Lucilla Lame, MD;  Location: [facility];  Service: Endoscopy;  Laterality: N/A;  . EYE SURGERY Bilateral    Catract Extraction with IOL  . HAMMER TOE SURGERY Left    2nd and 3rd digits  . JOINT REPLACEMENT Bilateral    Total Hip Replacement  . JOINT REPLACEMENT Left    Total Knee Replacement  . LEFT HEART CATHETERIZATION WITH CORONARY ANGIOGRAM N/A 02/24/2014   Procedure: LEFT HEART CATHETERIZATION WITH CORONARY ANGIOGRAM;  Surgeon: Leonie Man, MD;  Location: [facility] CATH LAB;  Service: Cardiovascular;  Laterality: N/A;  . MASTECTOMY Left 1990's  . SHOULDER SURGERY Right    "cleaned it out; no scope"  . THYROIDECTOMY, PARTIAL  1950's  . TOE SURGERY Right    "cut piece of bone out so toe didn't dig into my foot"  . TONSILLECTOMY  1950's  . TOTAL HIP ARTHROPLASTY Bilateral 1990's  . TOTAL KNEE ARTHROPLASTY Left 1990's  . TRIGGER FINGER RELEASE Left   . TUBAL LIGATION    . VAGINAL HYSTERECTOMY     "partial"   Family History  Problem Relation Age of Onset  . Arthritis Mother   . Cancer Mother   . Diabetes Mother   . Heart disease Mother   . Hyperlipidemia Mother   . Hypertension Mother     . Alcohol abuse Father   . Brain cancer Father   . Arthritis Maternal Grandmother   . Alzheimer's disease Maternal Grandmother   . Diabetes Daughter   . Hyperlipidemia Daughter   . Hypertension Daughter   . Thyroid cancer Daughter   . Diabetes Son   . Diabetes Daughter   . Hypertension Daughter   . Diabetes Son   . Hypertension Son   .  Hyperlipidemia Son   . Thyroid cancer Son   . Diabetes Brother   . Diabetes Brother    Social History   Socioeconomic History  . Marital status: Widowed    Spouse name: Not on file  . Number of children: Not on file  . Years of education: Not on file  . Highest education level: Not on file  Social Needs  . Financial resource strain: Not on file  . Food insecurity - worry: Not on file  . Food insecurity - inability: Not on file  . Transportation needs - medical: Not on file  . Transportation needs - non-medical: Not on file  Occupational History  . Not on file  Tobacco Use  . Smoking status: Former Smoker    Packs/day: 1.50    Years: 44.00    Pack years: 66.00    Types: Cigarettes    Last attempt to quit: 01/06/1994    Years since quitting: 22.9  . Smokeless tobacco: Never Used  . Tobacco comment: "quit smoking cigarettes in ~ 2000"  Substance and Sexual Activity  . Alcohol use: No    Alcohol/week: 0.0 oz    Comment: 02/23/2014 "no alcohol since 1990's"  . Drug use: No  . Sexual activity: No  Other Topics Concern  . Not on file  Social History Narrative   Lives with daughter in a mobile home.  Has 4 children alive, 1 deceased.  Retired from [employer].  Education: high school.    Interim medical history since last visit reviewed. Allergies and medications reviewed  Review of Systems Per HPI unless specifically indicated above     Objective:    BP 132/64   Pulse 82   Temp 98 F (36.7 C) (Oral)   Resp 18   Wt 205 lb 8 oz (93.2 kg)   SpO2 93%   BMI 41.51 kg/m   Wt Readings from Last 3 Encounters:  11/21/16 205 lb 8 oz  (93.2 kg)  10/10/16 206 lb (93.4 kg)  10/08/16 207 lb 9.6 oz (94.2 kg)    Physical Exam  Constitutional:  Obese elderly female, appears significantly deconditioned; nontoxic  HENT:  Mouth/Throat: Oropharynx is clear and moist.  Eyes: No scleral icterus.  Cardiovascular: Normal rate and regular rhythm.  Pulmonary/Chest:  Wearing supplemental oxygen via nasal cannula at 2 l/m; no dyspnea while seated, but easily dyspneic with exertion  Musculoskeletal:       Right knee: She exhibits decreased range of motion, swelling and ecchymosis. Tenderness found.  Small hematoma of the medial right thigh with significant ecchymosis  Neurological:  Oriented to name, Nov 2018, doctor's office, [place]  Skin: No pallor.  Psychiatric: Her mood appears not anxious. Her affect is not blunt. She exhibits a depressed mood.  Tearful when discussing her many health issues and their impact on her quality of life   Diabetic Foot Form - Detailed   Diabetic Foot Exam - detailed Diabetic Foot exam was performed with the following findings:  Yes 11/21/2016  1:09 PM  Visual Foot Exam completed.:  Yes  Pulse Foot Exam completed.:  Yes  Sensory Foot Exam Completed.:  Yes Semmes-Weinstein Monofilament Test         Assessment & Plan:   Problem List Items Addressed This Visit      Cardiovascular and Mediastinum   PVD- h/o LCEA (Chronic)    Stable, left carotid endarterectomy      Relevant Medications   nitroGLYCERIN (NITROSTAT) 0.4 MG SL tablet   Other Relevant  Orders   Lipid panel (Completed)   HTN (hypertension) (Chronic)    controlled      Relevant Medications   nitroGLYCERIN (NITROSTAT) 0.4 MG SL tablet   CAD S/P remote PCI  (Chronic)    Refill given of NTG      Relevant Medications   nitroGLYCERIN (NITROSTAT) 0.4 MG SL tablet   Other Relevant Orders   CBC with Differential/Platelet   COMPLETE METABOLIC PANEL WITH GFR     Respiratory   COPD, moderate (HCC)    Seeing pulmonologist       Relevant Orders   AMB Referral to [place] Management   Chronic respiratory failure with hypoxia (HCC)    On chronic supplemental O2; cannot tolerate prednisone because of sugar elevation with her diabetes; will try to place in skilled facility; her dyspnea limits her ability to do household chores, ambulation      Relevant Orders   AMB Referral to [place] Management   Chronic interstitial lung disease ([place])    Seeing pulmonologist; on chronic supplemental oxygen      Relevant Orders   AMB Referral to [place] Management     Endocrine   Graves' disease with exophthalmos    Check TSH      Relevant Orders   TSH (Completed)   TSH   Diabetic polyneuropathy associated with diabetes mellitus due to underlying condition ([place])    Foot exam by MD; I offered starting prednisone for her pain and breathing, but she declined saying that the prednisone in the past ran her sugars up too high; seeing endocrinologist in [place]      Relevant Medications   gabapentin (NEURONTIN) 600 MG tablet   Other Relevant Orders   AMB Referral to [place] Management   Lipid panel (Completed)   Lipid panel     Nervous and Auditory   Diffuse cerebral atrophy    Chronic condition; trying to stay on top of multiple chronic health problems      Relevant Orders   AMB Referral to [place] Management     Musculoskeletal and Integument   Facet arthropathy, lumbar    MRI from Sept 2016; now with worsening pain; falls; will address with pain medicine; injections did not really help; will apply for SNF with physical therapy      Relevant Orders   AMB Referral to [place] Management     Other   Low back pain   Relevant Medications   oxyCODONE-acetaminophen (ROXICET) 5-325 MG tablet   Other Relevant Orders   DG Lumbar Spine Complete (Completed)   Urinalysis w microscopic + reflex cultur (Completed)   Urinalysis w microscopic + reflex cultur   Multifactorial gait disorder    With increased  falling; weakness, peripheral neuropathy, brain atrophy, arthritis; using walker and still falling; refer to skilled facility for physical therapy      Relevant Orders   AMB Referral to [place] Management   Frequent falls    Patient has had numerous falls recently; multifactorial; will benefit from PT evaluation and therapy; refer to SNF      Relevant Orders   AMB Referral to [place] Management   Encounter for medication monitoring    Check labs      Relevant Orders   CBC with Differential/Platelet (Completed)   COMPLETE METABOLIC PANEL WITH GFR (Completed)    Other Visit Diagnoses    Acute pain of right knee    -  Primary   Relevant Orders  DG Knee Complete 4 Views Right (Completed)   Pain in right femur       Relevant Orders   DG FEMUR, MIN 2 VIEWS RIGHT (Completed)       Follow up plan: No Follow-up on file.  An after-visit summary was printed and given to the patient at [place].  Please see the patient instructions which may contain other information and recommendations beyond what is mentioned above in the assessment and plan.  Meds ordered this encounter  Medications  . gabapentin (NEURONTIN) 600 MG tablet    Sig: Take 1 tablet (600 mg total) 2 (two) times daily by mouth.  . nitroGLYCERIN (NITROSTAT) 0.4 MG SL tablet    Sig: Place 1 tablet (0.4 mg total) every 5 (five) minutes as needed under the tongue for chest pain. Max of 3 pills total, call 911    Dispense:  25 tablet    Refill:  5  . oxyCODONE-acetaminophen (ROXICET) 5-325 MG tablet    Sig: Take 0.5-1 tablets every 6 (six) hours as needed by mouth for moderate pain or severe pain.    Dispense:  20 tablet    Refill:  0    Orders Placed This Encounter  Procedures  . DG Knee Complete 4 Views Right  . DG Lumbar Spine Complete  . DG FEMUR, MIN 2 VIEWS RIGHT  . CBC with Differential/Platelet  . COMPLETE METABOLIC PANEL WITH GFR  . TSH  . Lipid panel  . Urinalysis w microscopic + reflex cultur  .  CBC with Differential/Platelet  . COMPLETE METABOLIC PANEL WITH GFR  . Lipid panel  . TSH  . Urinalysis w microscopic + reflex cultur  . REFLEXIVE URINE CULTURE  . AMB Referral to [place] Management   Face-to-face time with patient was more than 40 minutes, >50% time spent counseling and coordination of care

## 2016-11-21 NOTE — Assessment & Plan Note (Signed)
On chronic supplemental O2; cannot tolerate prednisone because of sugar elevation with her diabetes; will try to place in skilled facility; her dyspnea limits her ability to do household chores, ambulation

## 2016-11-21 NOTE — Assessment & Plan Note (Signed)
Seeing pulmonologist 

## 2016-11-21 NOTE — Assessment & Plan Note (Signed)
Foot exam by MD; I offered starting prednisone for her pain and breathing, but she declined saying that the prednisone in the past ran her sugars up too high; seeing endocrinologist in [facility]

## 2016-11-21 NOTE — Assessment & Plan Note (Signed)
Refill given of NTG

## 2016-11-21 NOTE — Assessment & Plan Note (Signed)
Patient has had numerous falls recently; multifactorial; will benefit from PT evaluation and therapy; refer to SNF

## 2016-11-21 NOTE — Assessment & Plan Note (Signed)
Chronic condition; trying to stay on top of multiple chronic health problems

## 2016-11-22 LAB — CBC WITH DIFFERENTIAL/PLATELET
BASOS PCT: 0.4 %
Basophils Absolute: 32 cells/uL (ref 0–200)
EOS PCT: 3.1 %
Eosinophils Absolute: 248 cells/uL (ref 15–500)
HCT: 33.7 % — ABNORMAL LOW (ref 35.0–45.0)
Hemoglobin: 11.2 g/dL — ABNORMAL LOW (ref 11.7–15.5)
LYMPHS ABS: 2064 {cells}/uL (ref 850–3900)
MCH: 29.1 pg (ref 27.0–33.0)
MCHC: 33.2 g/dL (ref 32.0–36.0)
MCV: 87.5 fL (ref 80.0–100.0)
MPV: 11.4 fL (ref 7.5–12.5)
Monocytes Relative: 7.7 %
NEUTROS PCT: 63 %
Neutro Abs: 5040 cells/uL (ref 1500–7800)
PLATELETS: 187 10*3/uL (ref 140–400)
RBC: 3.85 10*6/uL (ref 3.80–5.10)
RDW: 12.7 % (ref 11.0–15.0)
TOTAL LYMPHOCYTE: 25.8 %
WBC: 8 10*3/uL (ref 3.8–10.8)
WBCMIX: 616 {cells}/uL (ref 200–950)

## 2016-11-22 LAB — LIPID PANEL
CHOL/HDL RATIO: 2.8 (calc) (ref <5.0)
CHOLESTEROL: 174 mg/dL (ref <200)
HDL: 63 mg/dL (ref >50)
LDL CHOLESTEROL (CALC): 84 mg/dL
NON-HDL CHOLESTEROL (CALC): 111 mg/dL (ref <130)
TRIGLYCERIDES: 168 mg/dL — AB (ref <150)

## 2016-11-22 LAB — COMPLETE METABOLIC PANEL WITH GFR
AG Ratio: 1.4 (calc) (ref 1.0–2.5)
ALT: 16 U/L (ref 6–29)
AST: 21 U/L (ref 10–35)
Albumin: 4.2 g/dL (ref 3.6–5.1)
Alkaline phosphatase (APISO): 128 U/L (ref 33–130)
BUN/Creatinine Ratio: 14 (calc) (ref 6–22)
BUN: 14 mg/dL (ref 7–25)
CALCIUM: 9.1 mg/dL (ref 8.6–10.4)
CO2: 32 mmol/L (ref 20–32)
CREATININE: 1.03 mg/dL — AB (ref 0.60–0.93)
Chloride: 100 mmol/L (ref 98–110)
GFR, EST AFRICAN AMERICAN: 62 mL/min/{1.73_m2} (ref >=60)
GFR, EST NON AFRICAN AMERICAN: 54 mL/min/{1.73_m2} — AB (ref >=60)
Globulin: 2.9 g/dL (calc) (ref 1.9–3.7)
Glucose, Bld: 194 mg/dL — ABNORMAL HIGH (ref 65–99)
POTASSIUM: 4.1 mmol/L (ref 3.5–5.3)
Sodium: 140 mmol/L (ref 135–146)
TOTAL PROTEIN: 7.1 g/dL (ref 6.1–8.1)
Total Bilirubin: 1.5 mg/dL — ABNORMAL HIGH (ref 0.2–1.2)

## 2016-11-22 LAB — URINALYSIS W MICROSCOPIC + REFLEX CULTURE
BACTERIA UA: NONE SEEN /HPF
Bilirubin Urine: NEGATIVE
HGB URINE DIPSTICK: NEGATIVE
Hyaline Cast: NONE SEEN /LPF
KETONES UR: NEGATIVE
LEUKOCYTE ESTERASE: NEGATIVE
Nitrites, Initial: NEGATIVE
PROTEIN: NEGATIVE
RBC / HPF: NONE SEEN /HPF (ref 0–2)
Specific Gravity, Urine: 1.011 (ref 1.001–1.03)
WBC UA: NONE SEEN /HPF (ref 0–5)
pH: 6.5 (ref 5.0–8.0)

## 2016-11-22 LAB — TSH: TSH: 1.61 mIU/L (ref 0.40–4.50)

## 2016-11-22 LAB — NO CULTURE INDICATED

## 2016-11-24 ENCOUNTER — Other Ambulatory Visit: Payer: Self-pay | Admitting: Licensed Clinical Social Worker

## 2016-11-24 NOTE — Patient Outreach (Signed)
[facility]) Care Management  11/24/2016  Bonnie Henry [birth year] [id number]  Assessment- CSW received new referral on patient from PCP. Patient is wanting SNF placement. PCP has already faxed completed FL2. CSW completed initial outreach call to patient but was unable to reach her. HIPPA compliant voice message was left encouraging a return call once available.  Plan-CSW will await for return call or complete additional outreach. CSW will send involvement letter to PCP.  Bonnie Henry, BSW, MSW, [email or web address] Phone: [phone] Fax: [phone]

## 2016-11-25 ENCOUNTER — Other Ambulatory Visit: Payer: Self-pay | Admitting: Licensed Clinical Social Worker

## 2016-11-25 ENCOUNTER — Telehealth: Payer: Self-pay

## 2016-11-25 NOTE — Telephone Encounter (Signed)
Copied from [place] [phone]. Topic: Inquiry >> Nov 25, 2016  3:44 PM Vernona Rieger wrote: Social Worker is calling from [facility], she is giving an update for Dr Sanda Klein. She faxed over the [facility] that Dr lada completed, she sent it to a lot of facilities in [place] and a lot are denying her bc she did not have that 3 day hospitalization. Medicare can not pay for that. Family is not wanting to do private pay at the facility either. They are deciding now if they want to do long term placement now since insurance want pay for short term stay. She said she will keep Dr Sanda Klein updated. Slaughters [name]) Education officer, museum.

## 2016-11-25 NOTE — Patient Outreach (Addendum)
[facility]) Care Management  11/25/2016  LENAH MESSENGER 05-Feb-1940 [id number]  Assessment- CSW faxed completed FL2 to [facility], [place], [facility], [facility], [facility], [facility] and [place] [facility] successfully. However, CSW spoke to several facilities to follow up on whether they received FL2 and all stated that since patient did not have a 3 day qualifying stay at a hospital that she would need to be private pay for short term rehab as Medicare will not cover this request. Prices vary from $200-$300 per day at [facility].   CSW completed call to patient's daughter and provided update. Family is NOT interested in private pay for SNF placement. CSW informed family of their options: Patient would need a 3 day stay a hospital to gain SNF placement that Medicare will cover or family can start the LTC placement process. Daughter is unsure if patient is willing to do LTC placement but will discuss this with her today. Daughter is agreeable to CSW following up tomorrow on their decision.  CSW completed call to PCP's office and left message with front desk to provide update to PCP.  Plan-CSW will follow up within 24 hours.  [name], BSW, MSW, [email or web address] Phone: [phone] Fax: [phone]

## 2016-11-25 NOTE — Patient Outreach (Signed)
[facility]) Care Management  11/25/2016  Bonnie Henry 1940-02-22 [id number]  Assessment- CSW completed second outreach to patient and patient answered accordingly. HIPPA verifications were received. CSW introduced self, reason for call and of [facility] social work services. Patient reports that her daughter is more familiar with her health care needs. However, patient is agreeable to short term SNF placement. She states that she does not want LTC placement at this time. She shares that she has had several falls recently. She reports using a walker. Patient reports that her daughter lives with her and that she would prefer for CSW to contact her to coordinate care and inform her on the SNF placement process. CSW completed call to patient's daughter at [phone]. Daughter provided HIPPA verifications after answering call. CSW introduced self and reason for call. Daughter was educated on the SNF placement process. CSW questioned if there were any facilities they would like CSW to start with first and family declined. CSW informed family that this may be tricky to place her soon due to the upcoming holiday. Daughter reports that patient's grandson is visiting patient on Thanksgiving and family is okay with placement after the holiday. CSW informed family that CSW will may FL2 to several facilities within the area today. Daughter appreciative of assistance.  [facility] CM Care Plan Problem One     Most Recent Value  Care Plan Problem One  Need for SNF placement per PCP and family  Role Documenting the Problem One  Clinical Social Worker  Care Plan for Problem One  Active  [facility] Long Term Goal   Patient will be placed at SNF within 30 days per PCP request and will have a safe discharge back home from SNF within 90 days per family and patient report  [facility] Long Term Goal Start Date  11/25/16  Interventions for Problem One Long Term Goal  CSW has received FL2 from PCP. CSW will fax FL2 to several  facilities to start SNF placement. CSW has discussed the process with family so they can know what to expect. CSW will coordinate with SNF admissions to seek placement.     Plan-CSW will fax FL2 to multiple facilities in order to seek SNF placement.  [name], BSW, MSW, [email or web address] Phone: [phone] Fax: [phone]

## 2016-11-26 ENCOUNTER — Telehealth: Payer: Self-pay | Admitting: Family Medicine

## 2016-11-26 ENCOUNTER — Telehealth: Payer: Self-pay | Admitting: Neurology

## 2016-11-26 ENCOUNTER — Other Ambulatory Visit: Payer: Self-pay | Admitting: Licensed Clinical Social Worker

## 2016-11-26 NOTE — Telephone Encounter (Signed)
Hi, Lorna Few, could you check with Hinton Dyer to refer her to any neuropsychiatrist who can see her the earliest? Thanks.   Rosalin Hawking, MD PhD Stroke Neurology 11/26/2016 2:38 PM

## 2016-11-26 NOTE — Patient Outreach (Signed)
[facility]) Care Management  11/26/2016  SHELBI VACCARO [birth year] [id number]  Assessment- CSW completed outreach call to patient's daughter Nara and she answered successfully. HIPPA verifications provided. CSW informed daughter that she was wanting to follow up on family's decision for LTC placement or private pay SNF stay. Family is not agreeable to private pay. Daughter reports that patient is not wanting LTC placement at this time either. CSW informed daughter that if family desires LTC placement then CSW can always get back involved to assist with this. CSW provided education on the LTC placement process. CSW provided education on in home support if patient is going to resume staying at home. CSW questioned if family is needing assistance with transportation and daughter declined. CSW will follow up with family within one week to assess further social work needs.   Plan-CSW will follow up within one week and continue to provide social work assistance and support.  [name], BSW, MSW, [email or web address] Phone: [phone] Fax: [phone]

## 2016-11-26 NOTE — Telephone Encounter (Signed)
Pt's daughter called said the pt missed the appt scheduled with Dr Halina Andreas and it has been r/s to 2/14. She is wanting to know if there is anyway Dr Erlinda Hong could get the pt in sooner. Please call to advise.

## 2016-11-26 NOTE — Telephone Encounter (Signed)
Rn call patients daughter back about her mom miss appt with Dr. Richrd Sox heath. Rn stated Dr .Halina Andreas schedule is always because of her work hours. Rn stated previous pts have been schedule out because of miss appts. Rn will send message to Dr. Erlinda Hong if he wants to r/s appt or refer her somewhere else. FYI.

## 2016-11-26 NOTE — Telephone Encounter (Signed)
Copied from [place] [phone]. Topic: Quick Communication - See Telephone Encounter >> Nov 26, 2016 11:03 AM Aurelio Brash B wrote: CRM for notification. See Telephone encounter for: daughter requesting lab results  11/26/16.

## 2016-11-26 NOTE — Telephone Encounter (Signed)
Daughter notified of labs

## 2016-12-01 ENCOUNTER — Other Ambulatory Visit: Payer: Self-pay | Admitting: Licensed Clinical Social Worker

## 2016-12-01 ENCOUNTER — Ambulatory Visit: Payer: Self-pay | Admitting: Neurology

## 2016-12-01 NOTE — Telephone Encounter (Signed)
Thanks much, Hinton Dyer, for your effort.   Rosalin Hawking, MD PhD Stroke Neurology 12/01/2016 4:28 PM

## 2016-12-01 NOTE — Patient Outreach (Signed)
Request received from Brooke Joyce, LCSW to mail patient personal care resources.  Information mailed today. 

## 2016-12-01 NOTE — Telephone Encounter (Signed)
I will send to the [facility] to See Dr. Marlane Hatcher  Neuropsychological testing . Telephone  [place] . I tried to call patient and tell her and tell her details and her telephone just rang and rang. Patient has no voice mail. Referral has been sent.

## 2016-12-01 NOTE — Patient Outreach (Signed)
[facility]) Care Management  12/01/2016  Bonnie Henry 07-11-1940 [id number]  Assessment- CSW completed outreach call to patient and both patient and daughter answered. CSW was placed on speaker phone so that she could discuss LTC placement with both family members. Daughter reports that they are still unsure if they wish to do LTC placement and would prefer to do SNF for a short term stay first but understands that insurance cannot pay for this at this time because there has been no qualifying hospital stay. CSW provided education on the LTC placement process including the application process for Special Assistance Medicaid. CSW also educated from on personal care service resources in the case that patient continues to stay at home. Patient questions if CSW can mail all resources to their residence for them to review to assist them in their decision making. CSW is agreeable to do this. CSW encouraged family to take some time to discuss their decision and to also consider doing a tour at a facility of their choosing if LTC placement is wanted. Family is agreeable to case closure at this time as family is undecided on if they wish to pursue LTC placement or not as short term SNF staff is no longer an option. However, CSW can get involved again at any time to assist with social work needs once a decision has been made. Family is agreeable to this and will contact CSW in the future if they have any questions or concerns.   Plan-CSW will send case closure letter to PCP. CSW will update [facility] Care Management Assistant. CSW will send requested resource information to patient's residence to assist with their decision making process.  [name], BSW, MSW, [email or web address] Phone: [phone] Fax: [phone]

## 2016-12-02 ENCOUNTER — Telehealth: Payer: Self-pay

## 2016-12-02 DIAGNOSIS — E1042 Type 1 diabetes mellitus with diabetic polyneuropathy: Secondary | ICD-10-CM | POA: Diagnosis not present

## 2016-12-02 NOTE — Telephone Encounter (Signed)
The paperwork I completed should be valid Please contact [facility] social worker and get this process started again Thank you; see her note for other questions

## 2016-12-02 NOTE — Telephone Encounter (Signed)
Called pt's daughter advised her that as long as there were no major changes in her mother's health that we could avoid her appt tomorrow as she was just recently seen. Daughter states that there were no changes that they simply need forms changed from short term to long term in order to comply with Medicaid. Pt states that she was told the physician would need to call the state in order to get these forms approved.

## 2016-12-03 ENCOUNTER — Ambulatory Visit: Payer: Self-pay | Admitting: Family Medicine

## 2016-12-03 ENCOUNTER — Other Ambulatory Visit: Payer: Self-pay | Admitting: Licensed Clinical Social Worker

## 2016-12-03 ENCOUNTER — Other Ambulatory Visit: Payer: Self-pay | Admitting: Family Medicine

## 2016-12-03 DIAGNOSIS — M1711 Unilateral primary osteoarthritis, right knee: Secondary | ICD-10-CM | POA: Diagnosis not present

## 2016-12-03 NOTE — Patient Outreach (Signed)
[facility]) Care Management  12/03/2016  DAVE MANNES [birth year] [id number]  Assessment- CSW completed outreach call to daughter to follow up on LTC placement as earlier today was not a good time for her to discuss. CSW was unable to reach daughter but left a HIPPA compliant voice message with an update that FL2 will be up at front desk at PCP office for her to pick up in order to go to DSS and apply for LTC Medicaid. CSW also encouraged return call once available.   Plan-CSW will await for return call from patient or complete additional outreach by 12/05/16.  [name], BSW, MSW, [email or web address] Phone: [phone] Fax: [phone]

## 2016-12-03 NOTE — Telephone Encounter (Signed)
Called Bonnie Fried, RN and social worker to clarify what we need to do in order to help this pt received long term facility care. Advised her that FL2s are done and filled out correctly, she states that when she spoke to the pt as well as the pt's daughter they were hesitant at that time to pursue long term care. However now there are ready, will place FL2s up front for daughter to obtain copy in order to take to DSS to get the process going.

## 2016-12-03 NOTE — Patient Outreach (Signed)
[facility]) Care Management  12/03/2016  GRETTA SAMONS [birth year] [id number]  Assessment- CSW received incoming phone call from PCP's nurse on 12/03/16. She reports that she spoke with patient's daughter and family have decided that they do want LTC placement now. CSW will re-open case now that decision has been made. CSW completed call to patient's daughter and she answered. HIPPA verifications provided. Daughter reports that family do want LTC placement now. CSW provided education on the LTC process again. CSW informed daughter that she will need to go to DSS to apply for LTC Medicaid but will need FL2. Daughter is willing to go to PCP office and pick up FL2 either today or tomorrow. Daughter reports that this is not a good time to talk but request that CSW contact her at a later time.   CSW completed call to PCP office and spoke with PCP's nurse. CSW informed staff to please leave FL2 with front desk for family to pick up.  Plan-CSW will follow up within 24 hours.  [name], BSW, MSW, [email or web address] Phone: [phone] Fax: [phone]

## 2016-12-04 ENCOUNTER — Other Ambulatory Visit: Payer: Self-pay | Admitting: Licensed Clinical Social Worker

## 2016-12-04 NOTE — Patient Outreach (Signed)
[facility]) Care Management  12/04/2016  Bonnie Henry August 31, 1940 [id number]  Assessment-CSW completed outreach attempt today to see if family was able to pick up FL2 form and apply for LTC Medicaid services. CSW unable to reach patient successfully. CSW left a HIPPA compliant voice message encouraging patient to return call once available.  Plan-CSW will await return call or complete an additional outreach if needed.  [name], BSW, MSW, [email or web address] Phone: [phone] Fax: [phone]

## 2016-12-05 ENCOUNTER — Other Ambulatory Visit: Payer: Self-pay | Admitting: Licensed Clinical Social Worker

## 2016-12-05 NOTE — Patient Outreach (Signed)
[facility]) Care Management  12/05/2016  Bonnie Henry Apr 25, 1940 [id number]  Assessment- CSW completed outreach call to patient's home and patient's daughter answered. She reports that she successfully picked up FL2 from PCP's office. Daughter reports gathering the necessary documents in order to apply for LTC Medicaid. Daughter has address to DSS office in [place], [place]. [place] questioned when she will go to DSS and she reports "probably on Monday." CSW suggested that family take the weekend to complete some tours of facilities they are interested in. Daughter reports doing some research online on some of the facilities CSW sent her. Daughter mentioned [facility] and [facility]. CSW informed her that they have limited beds for Medicaid but that she can contact to question. CSW will follow up on 12/08/16.  CSW contacted both [facility] and [facility] and left a message with Admissions.   Plan-CSW will continue to assist with LTC placement and follow up on 12/08/16.  [name], BSW, MSW, [email or web address] Phone: [phone] Fax: [phone]

## 2016-12-08 ENCOUNTER — Ambulatory Visit (HOSPITAL_COMMUNITY)
Admission: RE | Admit: 2016-12-08 | Discharge: 2016-12-08 | Disposition: A | Discharge status: Home / Self Care | Payer: Medicare Other | Source: Ambulatory Visit | Attending: Specialist | Admitting: Specialist

## 2016-12-08 ENCOUNTER — Other Ambulatory Visit: Payer: Self-pay | Admitting: Licensed Clinical Social Worker

## 2016-12-08 DIAGNOSIS — R0609 Other forms of dyspnea: Secondary | ICD-10-CM | POA: Diagnosis not present

## 2016-12-08 DIAGNOSIS — R918 Other nonspecific abnormal finding of lung field: Secondary | ICD-10-CM | POA: Diagnosis not present

## 2016-12-08 DIAGNOSIS — I700 Atherosclerosis of aorta: Secondary | ICD-10-CM | POA: Diagnosis not present

## 2016-12-08 DIAGNOSIS — J449 Chronic obstructive pulmonary disease, unspecified: Secondary | ICD-10-CM | POA: Diagnosis not present

## 2016-12-08 NOTE — Patient Outreach (Signed)
[facility]) Care Management  12/08/2016  Bonnie Henry April 27, 1940 [id number]  Assessment-CSW completed outreach attempt today to both patient's residence number and daughter' mobile number. CSW unable to reach either successfully. CSW left a HIPPA compliant voice message encouraging family to return call once available. Daughter was agreeable to go to DSS to apply for LTC Medicaid on 12/08/16 and CSW encouraged family on voice message to continue with this plan as the FL2 is time sensitive.   Plan-CSW will await return call or complete an additional outreach if needed.  [name], BSW, MSW, [email or web address] Phone: [phone] Fax: [phone]

## 2016-12-09 ENCOUNTER — Encounter: Payer: Self-pay | Admitting: Family Medicine

## 2016-12-09 ENCOUNTER — Ambulatory Visit (INDEPENDENT_AMBULATORY_CARE_PROVIDER_SITE_OTHER): Payer: Medicare Other | Admitting: Family Medicine

## 2016-12-09 ENCOUNTER — Emergency Department
Admission: EM | Admit: 2016-12-09 | Discharge: 2016-12-11 | Disposition: A | Discharge status: Other Acute Inpatient Hospital | Payer: Medicare Other | Attending: Emergency Medicine | Admitting: Emergency Medicine

## 2016-12-09 ENCOUNTER — Emergency Department: Payer: Medicare Other

## 2016-12-09 ENCOUNTER — Other Ambulatory Visit: Payer: Self-pay

## 2016-12-09 ENCOUNTER — Encounter: Payer: Self-pay | Admitting: Emergency Medicine

## 2016-12-09 ENCOUNTER — Other Ambulatory Visit: Payer: Self-pay | Admitting: Licensed Clinical Social Worker

## 2016-12-09 VITALS — BP 136/78 | HR 69 | Temp 98.0°F | Resp 18 | Wt 205.6 lb

## 2016-12-09 DIAGNOSIS — I2510 Atherosclerotic heart disease of native coronary artery without angina pectoris: Secondary | ICD-10-CM | POA: Insufficient documentation

## 2016-12-09 DIAGNOSIS — Z046 Encounter for general psychiatric examination, requested by authority: Secondary | ICD-10-CM | POA: Diagnosis not present

## 2016-12-09 DIAGNOSIS — K219 Gastro-esophageal reflux disease without esophagitis: Secondary | ICD-10-CM | POA: Diagnosis present

## 2016-12-09 DIAGNOSIS — F0390 Unspecified dementia without behavioral disturbance: Secondary | ICD-10-CM

## 2016-12-09 DIAGNOSIS — N189 Chronic kidney disease, unspecified: Secondary | ICD-10-CM | POA: Insufficient documentation

## 2016-12-09 DIAGNOSIS — J441 Chronic obstructive pulmonary disease with (acute) exacerbation: Secondary | ICD-10-CM | POA: Diagnosis not present

## 2016-12-09 DIAGNOSIS — R51 Headache: Secondary | ICD-10-CM | POA: Diagnosis not present

## 2016-12-09 DIAGNOSIS — I700 Atherosclerosis of aorta: Secondary | ICD-10-CM | POA: Diagnosis not present

## 2016-12-09 DIAGNOSIS — Z87891 Personal history of nicotine dependence: Secondary | ICD-10-CM | POA: Diagnosis not present

## 2016-12-09 DIAGNOSIS — E1122 Type 2 diabetes mellitus with diabetic chronic kidney disease: Secondary | ICD-10-CM | POA: Diagnosis not present

## 2016-12-09 DIAGNOSIS — R45851 Suicidal ideations: Secondary | ICD-10-CM | POA: Diagnosis not present

## 2016-12-09 DIAGNOSIS — F03A0 Unspecified dementia, mild, without behavioral disturbance, psychotic disturbance, mood disturbance, and anxiety: Secondary | ICD-10-CM

## 2016-12-09 DIAGNOSIS — I130 Hypertensive heart and chronic kidney disease with heart failure and stage 1 through stage 4 chronic kidney disease, or unspecified chronic kidney disease: Secondary | ICD-10-CM | POA: Diagnosis not present

## 2016-12-09 DIAGNOSIS — F333 Major depressive disorder, recurrent, severe with psychotic symptoms: Secondary | ICD-10-CM

## 2016-12-09 DIAGNOSIS — F329 Major depressive disorder, single episode, unspecified: Secondary | ICD-10-CM | POA: Insufficient documentation

## 2016-12-09 DIAGNOSIS — I6523 Occlusion and stenosis of bilateral carotid arteries: Secondary | ICD-10-CM

## 2016-12-09 DIAGNOSIS — F323 Major depressive disorder, single episode, severe with psychotic features: Secondary | ICD-10-CM

## 2016-12-09 DIAGNOSIS — J45909 Unspecified asthma, uncomplicated: Secondary | ICD-10-CM | POA: Insufficient documentation

## 2016-12-09 DIAGNOSIS — Z955 Presence of coronary angioplasty implant and graft: Secondary | ICD-10-CM | POA: Diagnosis not present

## 2016-12-09 DIAGNOSIS — J432 Centrilobular emphysema: Secondary | ICD-10-CM | POA: Diagnosis present

## 2016-12-09 DIAGNOSIS — Z9861 Coronary angioplasty status: Secondary | ICD-10-CM

## 2016-12-09 DIAGNOSIS — R0989 Other specified symptoms and signs involving the circulatory and respiratory systems: Secondary | ICD-10-CM | POA: Diagnosis not present

## 2016-12-09 DIAGNOSIS — Z794 Long term (current) use of insulin: Secondary | ICD-10-CM | POA: Diagnosis not present

## 2016-12-09 DIAGNOSIS — Z96652 Presence of left artificial knee joint: Secondary | ICD-10-CM | POA: Insufficient documentation

## 2016-12-09 DIAGNOSIS — J449 Chronic obstructive pulmonary disease, unspecified: Secondary | ICD-10-CM | POA: Diagnosis not present

## 2016-12-09 DIAGNOSIS — Z853 Personal history of malignant neoplasm of breast: Secondary | ICD-10-CM | POA: Insufficient documentation

## 2016-12-09 DIAGNOSIS — G8929 Other chronic pain: Secondary | ICD-10-CM | POA: Diagnosis present

## 2016-12-09 DIAGNOSIS — Z79899 Other long term (current) drug therapy: Secondary | ICD-10-CM | POA: Diagnosis not present

## 2016-12-09 DIAGNOSIS — E039 Hypothyroidism, unspecified: Secondary | ICD-10-CM | POA: Insufficient documentation

## 2016-12-09 DIAGNOSIS — R410 Disorientation, unspecified: Secondary | ICD-10-CM | POA: Diagnosis not present

## 2016-12-09 DIAGNOSIS — I509 Heart failure, unspecified: Secondary | ICD-10-CM | POA: Insufficient documentation

## 2016-12-09 DIAGNOSIS — Z96643 Presence of artificial hip joint, bilateral: Secondary | ICD-10-CM | POA: Insufficient documentation

## 2016-12-09 DIAGNOSIS — Z7982 Long term (current) use of aspirin: Secondary | ICD-10-CM | POA: Insufficient documentation

## 2016-12-09 DIAGNOSIS — E1140 Type 2 diabetes mellitus with diabetic neuropathy, unspecified: Secondary | ICD-10-CM | POA: Diagnosis present

## 2016-12-09 HISTORY — DX: Atherosclerosis of aorta: I70.0

## 2016-12-09 LAB — COMPREHENSIVE METABOLIC PANEL
ALBUMIN: 4.4 g/dL (ref 3.5–5.0)
ALK PHOS: 157 U/L — AB (ref 38–126)
ALT: 19 U/L (ref 14–54)
ANION GAP: 11 (ref 5–15)
AST: 31 U/L (ref 15–41)
BILIRUBIN TOTAL: 1.2 mg/dL (ref 0.3–1.2)
BUN: 16 mg/dL (ref 6–20)
CO2: 27 mmol/L (ref 22–32)
Calcium: 9.2 mg/dL (ref 8.9–10.3)
Chloride: 101 mmol/L (ref 101–111)
Creatinine, Ser: 1.08 mg/dL — ABNORMAL HIGH (ref 0.44–1.00)
GFR calc Af Amer: 57 mL/min — ABNORMAL LOW (ref >60)
GFR, EST NON AFRICAN AMERICAN: 49 mL/min — AB (ref >60)
GLUCOSE: 173 mg/dL — AB (ref 65–99)
Potassium: 3.8 mmol/L (ref 3.5–5.1)
Sodium: 139 mmol/L (ref 135–145)
Total Protein: 8.2 g/dL — ABNORMAL HIGH (ref 6.5–8.1)

## 2016-12-09 LAB — CBC
HCT: 36 % (ref 35.0–47.0)
HEMOGLOBIN: 12.1 g/dL (ref 12.0–16.0)
MCH: 29.5 pg (ref 26.0–34.0)
MCHC: 33.7 g/dL (ref 32.0–36.0)
MCV: 87.8 fL (ref 80.0–100.0)
PLATELETS: 204 10*3/uL (ref 150–440)
RBC: 4.1 MIL/uL (ref 3.80–5.20)
RDW: 13.9 % (ref 11.5–14.5)
WBC: 8.7 10*3/uL (ref 3.6–11.0)

## 2016-12-09 LAB — ETHANOL

## 2016-12-09 LAB — SALICYLATE LEVEL

## 2016-12-09 LAB — URINE DRUG SCREEN, QUALITATIVE (ARMC ONLY)
Amphetamines, Ur Screen: NOT DETECTED
BARBITURATES, UR SCREEN: NOT DETECTED
Benzodiazepine, Ur Scrn: NOT DETECTED
CANNABINOID 50 NG, UR ~~LOC~~: NOT DETECTED
COCAINE METABOLITE, UR ~~LOC~~: NOT DETECTED
MDMA (ECSTASY) UR SCREEN: NOT DETECTED
METHADONE SCREEN, URINE: NOT DETECTED
Opiate, Ur Screen: NOT DETECTED
Phencyclidine (PCP) Ur S: NOT DETECTED
TRICYCLIC, UR SCREEN: NOT DETECTED

## 2016-12-09 LAB — HEMOGLOBIN A1C
Hgb A1c MFr Bld: 8.2 % — ABNORMAL HIGH (ref 4.8–5.6)
MEAN PLASMA GLUCOSE: 188.64 mg/dL

## 2016-12-09 LAB — ACETAMINOPHEN LEVEL

## 2016-12-09 LAB — GLUCOSE, CAPILLARY: GLUCOSE-CAPILLARY: 180 mg/dL — AB (ref 65–99)

## 2016-12-09 MED ORDER — GABAPENTIN 300 MG PO CAPS
600.0000 mg | ORAL_CAPSULE | Freq: Two times a day (BID) | ORAL | Status: DC
  Administered 2016-12-09 – 2016-12-11 (×4): 600 mg via ORAL
  Filled 2016-12-09 (×5): qty 2

## 2016-12-09 MED ORDER — CARVEDILOL 6.25 MG PO TABS
12.5000 mg | ORAL_TABLET | Freq: Two times a day (BID) | ORAL | Status: DC
  Administered 2016-12-10 – 2016-12-11 (×3): 12.5 mg via ORAL
  Filled 2016-12-09 (×3): qty 2

## 2016-12-09 MED ORDER — IPRATROPIUM-ALBUTEROL 0.5-2.5 (3) MG/3ML IN SOLN
3.0000 mL | Freq: Once | RESPIRATORY_TRACT | Status: AC
  Administered 2016-12-09: 3 mL via RESPIRATORY_TRACT
  Filled 2016-12-09: qty 3

## 2016-12-09 MED ORDER — INSULIN ASPART 100 UNIT/ML ~~LOC~~ SOLN
0.0000 [IU] | Freq: Three times a day (TID) | SUBCUTANEOUS | Status: DC
  Administered 2016-12-10 (×2): 5 [IU] via SUBCUTANEOUS
  Administered 2016-12-11: 8 [IU] via SUBCUTANEOUS
  Administered 2016-12-11: 15 [IU] via SUBCUTANEOUS
  Filled 2016-12-09 (×4): qty 1

## 2016-12-09 MED ORDER — BUSPIRONE HCL 5 MG PO TABS
15.0000 mg | ORAL_TABLET | Freq: Two times a day (BID) | ORAL | Status: DC
  Administered 2016-12-09 – 2016-12-11 (×4): 15 mg via ORAL
  Filled 2016-12-09 (×4): qty 3

## 2016-12-09 MED ORDER — SPIRONOLACTONE 25 MG PO TABS
25.0000 mg | ORAL_TABLET | Freq: Every day | ORAL | Status: DC
  Administered 2016-12-10 – 2016-12-11 (×2): 25 mg via ORAL
  Filled 2016-12-09 (×3): qty 1

## 2016-12-09 MED ORDER — BUMETANIDE 0.5 MG PO TABS
0.5000 mg | ORAL_TABLET | Freq: Once | ORAL | Status: AC
  Administered 2016-12-09: 0.5 mg via ORAL
  Filled 2016-12-09: qty 1

## 2016-12-09 MED ORDER — RISPERIDONE 1 MG PO TABS
1.0000 mg | ORAL_TABLET | Freq: Every day | ORAL | Status: DC
  Administered 2016-12-09 – 2016-12-10 (×2): 1 mg via ORAL
  Filled 2016-12-09 (×2): qty 1

## 2016-12-09 MED ORDER — LEVOTHYROXINE SODIUM 112 MCG PO TABS
112.0000 ug | ORAL_TABLET | Freq: Every day | ORAL | Status: DC
  Administered 2016-12-10 – 2016-12-11 (×2): 112 ug via ORAL
  Filled 2016-12-09 (×2): qty 1

## 2016-12-09 MED ORDER — DULOXETINE HCL 60 MG PO CPEP
60.0000 mg | ORAL_CAPSULE | Freq: Every day | ORAL | Status: DC
  Administered 2016-12-09 – 2016-12-11 (×3): 60 mg via ORAL
  Filled 2016-12-09 (×3): qty 1

## 2016-12-09 MED ORDER — MONTELUKAST SODIUM 10 MG PO TABS
10.0000 mg | ORAL_TABLET | Freq: Every day | ORAL | Status: DC
  Administered 2016-12-09 – 2016-12-10 (×2): 10 mg via ORAL
  Filled 2016-12-09 (×3): qty 1

## 2016-12-09 NOTE — ED Notes (Signed)
Patient assigned to appropriate care area. Patient oriented to unit/care area: Informed that, for their safety, care areas are designed for safety and monitored by security cameras at all times; and visiting hours explained to patient. Patient verbalizes understanding, and verbal contract for safety obtained. 

## 2016-12-09 NOTE — Assessment & Plan Note (Signed)
Explained this on report to patient and daughter; goal LDL less than 70

## 2016-12-09 NOTE — ED Triage Notes (Signed)
Pt to ED with family c/o headache and confusion x4-5 months.  Was seen at PCP today and sent to ED.  Pt states SI x1 week without plan.  Denies HI.  Daughter states hallucinations, memory impairment, being a flight risk.

## 2016-12-09 NOTE — Patient Outreach (Signed)
[facility]) Care Management  12/09/2016  Bonnie Henry 08-13-1940 [id number]  Assessment-CSW completed outreach attempt today to both patient's residence and daughter's mobile number to follow up on whether family applied for LTC Medicaid. CSW unable to reach patient or family successfully. CSW left a HIPPA compliant voice message encouraging patient or daughter to return call once available.  Plan-CSW will await return call or complete an additional outreach if needed.  Bonnie Henry, BSW, MSW, [email or web address] Phone: [phone] Fax: [phone]
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
## 2016-12-09 NOTE — ED Notes (Signed)
Pt presents with si/depression x months, worsening in last 2 weeks. Pt states she hears voices and sees people who are there, and that they are "pushy." States she doesn't want to commit suicide, but "I wish it was all over." Pt's daughter allowed to stay with her until EDP assessment complete, and then she left with password. Pt desires her daughter [name] to have any and all information regarding her care and condition. Pt alert & oriented; chronically on 2L O2.

## 2016-12-09 NOTE — Patient Instructions (Signed)
Please go to the emergency department to have them evaluate your confusion and depression to see if you would benefit from being in the hospital They can have a psychiatrist evaluate you there

## 2016-12-09 NOTE — Assessment & Plan Note (Signed)
May have acute exacerbation; sending to ER for evaluation, though I think her respiratory symptoms are minor compared to her psychiatric condition today

## 2016-12-09 NOTE — Consult Note (Signed)
[facility] Consult   Reason for Consult: Consult for 76 year old woman brought in because of depression Referring Physician:  Clearnce Hasten Patient Identification: Bonnie Henry MRN:  [id number] Principal Diagnosis: Depression, major, recurrent, severe with psychosis ([place]) Diagnosis:   Patient Active Problem List   Diagnosis Date Noted  . Depression, major, recurrent, severe with psychosis ([place]) [F33.3] 12/09/2016  . Mild dementia [F03.90] 12/09/2016  . Facet arthropathy, lumbar [M47.816] 11/21/2016  . Hyperlipidemia due to type 2 diabetes mellitus ([place]) [E11.69, E78.5] 11/03/2016  . Cognitive impairment [R41.89] 08/26/2016  . Multifactorial gait disorder [R26.89] 08/26/2016  . Diabetic polyneuropathy associated with diabetes mellitus due to underlying condition ([place]) [E08.42] 08/26/2016  . Osteoarthritis of right knee [M17.11] 07/23/2016  . Graves' disease with exophthalmos [E05.00] 05/29/2016  . Medication monitoring encounter [Z51.81] 05/26/2016  . Renal insufficiency [N28.9] 08/03/2015  . Anemia, iron deficiency [D50.9] 07/22/2015  . Metabolic encephalopathy [W09.81] 07/21/2015  . Chronic respiratory failure with hypoxia ([place]) [J96.11] 07/21/2015  . History of right-sided carotid endarterectomy [Z98.890] 06/10/2015  . Carotid stenosis, right [I65.21] 05/16/2015  . Low back pain [M54.5] 05/04/2015  . Chronic nonintractable headache [R51] 03/30/2015  . Memory loss, short term [R41.3] 03/30/2015  . Patellar subluxation [S83.003A] 03/29/2015  . Constipation [K59.00] 03/04/2015  . Lumbar stenosis with neurogenic claudication [M48.062] 02/23/2015  . DDD (degenerative disc disease), lumbar [M51.36] 02/23/2015  . Cervical radiculitis [M54.12] 02/23/2015  . Encounter for medication monitoring [Z51.81] 02/20/2015  . Knee pain, bilateral [M25.561, M25.562] 02/08/2015  . Frequent falls [R29.6] 01/22/2015  . Diffuse cerebral atrophy [G31.9] 10/31/2014  . Chronic  interstitial lung disease ([place]) [J84.9] 10/26/2014  . Centrilobular emphysema ([place]) [J43.2] 10/26/2014  . Vision blurred [H53.8] 09/28/2014  . Acquired exophthalmos [H05.20] 09/28/2014  . Diabetes mellitus with diabetic neuropathy ([place]) [E11.40] 08/31/2014  . Hammertoe [M20.40] 08/31/2014  . Foot callus [L84] 08/31/2014  . Shoulder girdle syndrome [G54.5] 07/06/2014  . Anemia [D64.9] 05/21/2014  . Chronic pain [G89.29] 03/02/2014  . Depression [F32.9] 03/02/2014  . Acid reflux [K21.9] 03/02/2014  . Glaucoma [H40.9] 03/02/2014  . Adult hypothyroidism [E03.9] 03/02/2014  . Apnea, sleep [G47.30] 03/02/2014  . PAT (paroxysmal atrial tachycardia) ([place]) [I47.1] 02/24/2014  . Unstable angina ([place]) [I20.0] 02/23/2014  . Type 2 diabetes with nephropathy ([place]) [E11.21] 02/23/2014  . HTN (hypertension) [I10] 02/23/2014  . CAD S/P remote PCI  [I25.10, Z98.61] 02/23/2014  . High cholesterol [E78.00] 02/23/2014  . PVD- h/o LCEA [I73.9] 02/23/2014  . Morbid obesity ([place]) [E66.01] 02/23/2014  . Aortic stenosis murmur [I35.0] 02/23/2014  . COPD, moderate ([place]) [J44.9] 07/27/2013    Total Time spent with patient: 1 hour  Subjective:   Bonnie Henry is a 76 y.o. female patient admitted with "I have been seeing things that are not there" patient interviewed chart reviewed.  Reviewed recent notes from her primary care doctor.  Labs reviewed.  76 year old woman brought into the emergency room.  HPI: For initially what was thought to be medical complaints but then revealed that she was depressed and having suicidal thoughts.  Patient says she is been extremely depressed.  She cannot tell me how long it has been going on but she knows it has been getting worse recently.  She says every afternoon she is seeing people in the house who are not there.  Particularly her deceased son.  When she tells me that she starts to cry.  Patient says that she feels she has nothing positive in her life to live for.  Feels sad all the time.  Has had some suicidal thoughts and wishes she were not here anymore.  She says she is compliant with her medical treatment.  Recently she has been feeling more weak and has had some falls at home.  It looks like recently there is been an effort underway to try and get her moved to assisted living which may be part of what is making her so depressed because she tells me she thinks that her children do not care about her.  She denies using alcohol or drugs.  He thinks she is on some medicine for depression but she is not sure what it is.  Social history: Lives with her daughter.  Judging from multiple recent notes social work from her primary care doctor has been involved to try and work on possible transition to a different living environment.  Patient says she has 4 adult children who are alive one deceased.  The deceased one committed suicide a few years ago.  Medical history: Multiple medical problems including diabetes COPD chronic oxygen dependence history of coronary artery disease history of hypothyroidism  Substance abuse history: Denies any history of alcohol or drug abuse  Past Psychiatric History: Long-standing depression.  She says she has been on medicine several times in the past she cannot remember what it is.  Denies ever trying to kill herself in the past.  He thinks she has had one psychiatric hospitalization when she had a nervous breakdown about 18 years ago.  Does not remember where that was.  Risk to Self: Is patient at risk for suicide?: Yes Risk to Others:   Prior Inpatient Therapy:   Prior Outpatient Therapy:    Past Medical History:  Past Medical History:  Diagnosis Date  . Anemia   . Anxiety   . Arthritis    "joints ache all over" . osteoarthritis  . Asthma   . Atrial fibrillation ([place])   . Breast cancer, left breast (H[facility])    S/P mastectomy  . CAD (coronary artery disease)   . CHF (congestive heart failure) ([place])   . Chronic back pain    . Constipation 03/04/2015  . COPD (chronic obstructive pulmonary disease) ([place])   . Depression, major   . Diabetes mellitus without complication ([place])   . DVT (deep venous thrombosis) ([place])   . GERD (gastroesophageal reflux disease)   . Headache   . Heart murmur   . Hepatitis    HEP "C"  . History of blood transfusion    "when I had my mastectomy"  . History of hiatal hernia   . History of right-sided carotid endarterectomy 06/10/2015  . Hypercholesteremia   . Hypertension   . Morbid obesity ([place])   . Myocardial infarction ([place])   . Neuropathy   . Peripheral vascular disease ([place])   . Post-surgical hypothyroidism   . Restless leg syndrome   . Shortness of breath dyspnea   . Sleep apnea    "2015 wore mask; lost weight; told didn't need mask anymore" (02/23/2014)  . Type II diabetes mellitus ([place])     Past Surgical History:  Procedure Laterality Date  . ARTERY BIOPSY Right 09/29/2014   Procedure: BIOPSY TEMPORAL ARTERY;  Surgeon: Angelia Mould, MD;  Location: [facility];  Service: Vascular;  Laterality: Right;  . BARTHOLIN CYST MARSUPIALIZATION    . BREAST BIOPSY Left    Mastectomy  . CARDIAC CATHETERIZATION  2000's  . CAROTID ENDARTERECTOMY Left 2009  . CATARACT EXTRACTION W/ INTRAOCULAR LENS  IMPLANT, BILATERAL Bilateral ~ 2011  . CORONARY ANGIOPLASTY WITH STENT PLACEMENT  1990's X 2   "2; 1"  . ENDARTERECTOMY Right 05/16/2015   Procedure: ENDARTERECTOMY CAROTID;  Surgeon: Katha Cabal, MD;  Location: [facility];  Service: Vascular;  Laterality: Right;  . ESOPHAGOGASTRODUODENOSCOPY (EGD) WITH PROPOFOL N/A 12/12/2014   Procedure: ESOPHAGOGASTRODUODENOSCOPY (EGD) WITH PROPOFOL;  Surgeon: Lucilla Lame, MD;  Location: [facility];  Service: Endoscopy;  Laterality: N/A;  . EYE SURGERY Bilateral    Catract Extraction with IOL  . HAMMER TOE SURGERY Left    2nd and 3rd digits  . JOINT REPLACEMENT Bilateral    Total Hip Replacement  . JOINT REPLACEMENT Left    Total Knee  Replacement  . LEFT HEART CATHETERIZATION WITH CORONARY ANGIOGRAM N/A 02/24/2014   Procedure: LEFT HEART CATHETERIZATION WITH CORONARY ANGIOGRAM;  Surgeon: Leonie Man, MD;  Location: [facility] C[facility] LAB;  Service: Cardiovascular;  Laterality: N/A;  . MASTECTOMY Left 1990's  . SHOULDER SURGERY Right    "cleaned it out; no scope"  . THYROIDECTOMY, PARTIAL  1950's  . TOE SURGERY Right    "cut piece of bone out so toe didn't dig into my foot"  . TONSILLECTOMY  1950's  . TOTAL HIP ARTHROPLASTY Bilateral 1990's  . TOTAL KNEE ARTHROPLASTY Left 1990's  . TRIGGER FINGER RELEASE Left   . TUBAL LIGATION    . VAGINAL HYSTERECTOMY     "partial"   Family History:  Family History  Problem Relation Age of Onset  . Arthritis Mother   . Cancer Mother   . Diabetes Mother   . Heart disease Mother   . Hyperlipidemia Mother   . Hypertension Mother   . Alcohol abuse Father   . Brain cancer Father   . Arthritis Maternal Grandmother   . Alzheimer's disease Maternal Grandmother   . Diabetes Daughter   . Hyperlipidemia Daughter   . Hypertension Daughter   . Thyroid cancer Daughter   . Diabetes Son   . Diabetes Daughter   . Hypertension Daughter   . Diabetes Son   . Hypertension Son   . Hyperlipidemia Son   . Thyroid cancer Son   . Diabetes Brother   . Diabetes Brother    Family Psychiatric  History: Had relatives who had Alzheimer's disease and most notably had a son who committed suicide Social History:  Social History   Substance and Sexual Activity  Alcohol Use No  . Alcohol/week: 0.0 oz   Comment: 02/23/2014 "no alcohol since 1990's"     Social History   Substance and Sexual Activity  Drug Use No    Social History   Socioeconomic History  . Marital status: Widowed    Spouse name: None  . Number of children: None  . Years of education: None  . Highest education level: None  Social Needs  . Financial resource strain: None  . Food insecurity - worry: None  . Food insecurity -  inability: None  . Transportation needs - medical: None  . Transportation needs - non-medical: None  Occupational History  . None  Tobacco Use  . Smoking status: Former Smoker    Packs/day: 1.50    Years: 44.00    Pack years: 66.00    Types: Cigarettes    Last attempt to quit: 01/06/1994    Years since quitting: 22.9  . Smokeless tobacco: Never Used  . Tobacco comment: "quit smoking cigarettes in ~ 2000"  Substance and Sexual Activity  . Alcohol use: No  Alcohol/week: 0.0 oz    Comment: 02/23/2014 "no alcohol since 1990's"  . Drug use: No  . Sexual activity: No  Other Topics Concern  . None  Social History Narrative   Lives with daughter in a mobile home.  Has 4 children alive, 1 deceased.  Retired from [employer].  Education: high school.   Additional Social History:    Allergies:   Allergies  Allergen Reactions  . Vasotec [Enalapril] Swelling    Throat swells  . Codeine Nausea And Vomiting  . Morphine And Related Nausea And Vomiting    Labs:  Results for orders placed or performed during the hospital encounter of 12/09/16 (from the past 48 hour(s))  Comprehensive metabolic panel     Status: Abnormal   Collection Time: 12/09/16  4:11 PM  Result Value Ref Range   Sodium 139 135 - 145 mmol/L   Potassium 3.8 3.5 - 5.1 mmol/L   Chloride 101 101 - 111 mmol/L   CO2 27 22 - 32 mmol/L   Glucose, Bld 173 (H) 65 - 99 mg/dL   BUN 16 6 - 20 mg/dL   Creatinine, Ser 1.08 (H) 0.44 - 1.00 mg/dL   Calcium 9.2 8.9 - 10.3 mg/dL   Total Protein 8.2 (H) 6.5 - 8.1 g/dL   Albumin 4.4 3.5 - 5.0 g/dL   AST 31 15 - 41 U/L   ALT 19 14 - 54 U/L   Alkaline Phosphatase 157 (H) 38 - 126 U/L   Total Bilirubin 1.2 0.3 - 1.2 mg/dL   GFR calc non Af Amer 49 (L) >60 mL/min   GFR calc Af Amer 57 (L) >60 mL/min    Comment: (NOTE) The eGFR has been calculated using the CKD EPI equation. This calculation has not been validated in all clinical situations. eGFR's persistently <60 mL/min signify  possible Chronic Kidney Disease.    Anion gap 11 5 - 15  Ethanol     Status: None   Collection Time: 12/09/16  4:11 PM  Result Value Ref Range   Alcohol, Ethyl (B) <10 <10 mg/dL    Comment:        LOWEST DETECTABLE LIMIT FOR SERUM ALCOHOL IS 10 mg/dL FOR MEDICAL PURPOSES ONLY   Salicylate level     Status: None   Collection Time: 12/09/16  4:11 PM  Result Value Ref Range   Salicylate Lvl <8.1 2.8 - 30.0 mg/dL  Acetaminophen level     Status: Abnormal   Collection Time: 12/09/16  4:11 PM  Result Value Ref Range   Acetaminophen (Tylenol), Serum <10 (L) 10 - 30 ug/mL    Comment:        THERAPEUTIC CONCENTRATIONS VARY SIGNIFICANTLY. A RANGE OF 10-30 ug/mL MAY BE AN EFFECTIVE CONCENTRATION FOR MANY PATIENTS. HOWEVER, SOME ARE BEST TREATED AT CONCENTRATIONS OUTSIDE THIS RANGE. ACETAMINOPHEN CONCENTRATIONS >150 ug/mL AT 4 HOURS AFTER INGESTION AND >50 ug/mL AT 12 HOURS AFTER INGESTION ARE OFTEN ASSOCIATED WITH TOXIC REACTIONS.   cbc     Status: None   Collection Time: 12/09/16  4:11 PM  Result Value Ref Range   WBC 8.7 3.6 - 11.0 K/uL   RBC 4.10 3.80 - 5.20 MIL/uL   Hemoglobin 12.1 12.0 - 16.0 g/dL   HCT 36.0 35.0 - 47.0 %   MCV 87.8 80.0 - 100.0 fL   MCH 29.5 26.0 - 34.0 pg   MCHC 33.7 32.0 - 36.0 g/dL   RDW 13.9 11.5 - 14.5 %   Platelets 204 150 - 440 K/uL  No current facility-administered medications for this encounter.    Current Outpatient Medications  Medication Sig Dispense Refill  . acetaminophen (TYLENOL) 500 MG tablet Take 2 tablets (1,000 mg total) by mouth every 6 (six) hours as needed for moderate pain. 30 tablet 0  . albuterol (ACCUNEB) 0.63 MG/3ML nebulizer solution Take 1 ampule by nebulization every 6 (six) hours as needed for wheezing or shortness of breath.     Marland Kitchen albuterol (PROAIR HFA) 108 (90 BASE) MCG/ACT inhaler Inhale 2 puffs into the lungs every 4 (four) hours as needed for wheezing or shortness of breath.     . Ascorbic Acid (VITAMIN C PO)  Take 1 tablet by mouth 3 (three) times a week.     Marland Kitchen aspirin EC 81 MG tablet Take 81 mg by mouth daily.    Marland Kitchen atorvastatin (LIPITOR) 80 MG tablet Take 1 tablet (80 mg total) by mouth at bedtime. 90 tablet 1  . budesonide (PULMICORT) 0.5 MG/2ML nebulizer solution Take 0.5 mg by nebulization 2 (two) times daily.     . bumetanide (BUMEX) 0.5 MG tablet Take 0.5 mg by mouth as needed.     . busPIRone (BUSPAR) 15 MG tablet TAKE ONE TABLET BY MOUTH TWICE DAILY 180 tablet 3  . carvedilol (COREG) 12.5 MG tablet Take 12.5 mg by mouth 2 (two) times daily with a meal.     . DULoxetine (CYMBALTA) 60 MG capsule Take 1 capsule (60 mg total) by mouth daily. 30 capsule 3  . fluticasone (FLONASE) 50 MCG/ACT nasal spray Place 2 sprays into both nostrils as needed for allergies.     . formoterol (PERFOROMIST) 20 MCG/2ML nebulizer solution Inhale 20 mcg into the lungs 2 (two) times daily.     Marland Kitchen gabapentin (NEURONTIN) 600 MG tablet Take 1 tablet (600 mg total) 2 (two) times daily by mouth.    . Hypromellose (ARTIFICIAL TEARS OP) Place 1 drop into both eyes as needed (dry eyes).     . insulin NPH Human (HUMULIN N,NOVOLIN N) 100 UNIT/ML injection Inject 24 Units into the skin at bedtime.     . insulin regular (NOVOLIN R,HUMULIN R) 100 units/mL injection Inject 28 Units 3 (three) times daily before meals into the skin. Based on sliding scale     . levothyroxine (SYNTHROID, LEVOTHROID) 100 MCG tablet Take 1 tablet (100 mcg total) by mouth every other day. (alternate with the 112 mcg strength) (Patient taking differently: Take 100 mcg by mouth See admin instructions. Take 1 tablet (100 mcg) by mouth before breakfast Monday thru Thursday (take 112 mcg other days)) 15 tablet 2  . levothyroxine (SYNTHROID, LEVOTHROID) 112 MCG tablet Take 1 tablet (112 mcg total) by mouth every other day. (alternate with the 100 mcg strength) (Patient taking differently: Take 112 mcg by mouth See admin instructions. Take 1 tablet (112 mcg) by mouth  before breakfast on Friday, Saturday, Sunday (take 100 mcg other days)) 15 tablet 2  . meclizine (ANTIVERT) 25 MG tablet Take 0.5-1 tablets (12.5-25 mg total) by mouth 3 (three) times daily as needed for dizziness. 30 tablet 0  . montelukast (SINGULAIR) 10 MG tablet Take 1 tablet (10 mg total) by mouth daily. 30 tablet 11  . nitroGLYCERIN (NITROSTAT) 0.4 MG SL tablet Place 1 tablet (0.4 mg total) every 5 (five) minutes as needed under the tongue for chest pain. Max of 3 pills total, call 911 25 tablet 5  . Omega-3 Fatty Acids (FISH OIL) 1200 MG CAPS Take 1 capsule by mouth 2 (two)  times a week.     Marland Kitchen oxyCODONE-acetaminophen (ROXICET) 5-325 MG tablet Take 0.5-1 tablets every 6 (six) hours as needed by mouth for moderate pain or severe pain. 20 tablet 0  . OXYGEN Inhale 2 L into the lungs continuous.    . pantoprazole (PROTONIX) 20 MG tablet TAKE ONE TABLET BY MOUTH TWICE DAILY 180 tablet 1  . ranitidine (ZANTAC) 300 MG tablet Take 1 tablet (300 mg total) by mouth at bedtime. 30 tablet 11  . rOPINIRole (REQUIP) 1 MG tablet TAKE 1 TABLET BY MOUTH THREE TIMES DAILY 270 tablet 3  . spironolactone (ALDACTONE) 25 MG tablet Take 25 mg by mouth daily.    Marland Kitchen tiZANidine (ZANAFLEX) 2 MG tablet TAKE 1 TABLET BY MOUTH AT BEDTIME 30 tablet 2    Musculoskeletal: Strength & Muscle Tone: decreased Gait & Station: unsteady Patient leans: Front  Psychiatric Specialty Exam: Physical Exam  Nursing note and vitals reviewed. Constitutional: She appears well-developed and well-nourished.  HENT:  Head: Normocephalic and atraumatic.  Eyes: Conjunctivae are normal. Pupils are equal, round, and reactive to light.  Neck: Normal range of motion.  Cardiovascular: Regular rhythm and normal heart sounds.  Respiratory: She is in respiratory distress.  GI: Soft.  Musculoskeletal: Normal range of motion.  Neurological: She is alert.  Skin: Skin is warm and dry.  Psychiatric: Her speech is delayed. She is slowed. Thought  content is paranoid. She expresses impulsivity. She exhibits a depressed mood. She expresses suicidal ideation. She exhibits abnormal recent memory.    Review of Systems  Constitutional: Negative.   HENT: Negative.   Eyes: Negative.   Respiratory: Positive for shortness of breath.   Cardiovascular: Negative.   Gastrointestinal: Negative.   Musculoskeletal: Negative.   Skin: Negative.   Neurological: Negative.   Psychiatric/Behavioral: Positive for depression, hallucinations, memory loss and suicidal ideas. Negative for substance abuse. The patient is nervous/anxious. The patient does not have insomnia.     Blood pressure (!) 147/58, pulse 68, temperature 98 F (36.7 C), temperature source Oral, resp. rate 18, height 4' 9" (1.448 m), weight 205 lb (93 kg), SpO2 100 %.Body mass index is 44.36 kg/m.  General Appearance: Disheveled  Eye Contact:  Fair  Speech:  Slow  Volume:  Decreased  Mood:  Depressed and Dysphoric  Affect:  Depressed and Tearful  Thought Process:  Goal Directed  Orientation:  Full (Time, Place, and Person)  Thought Content:  Hallucinations: Visual and Rumination  Suicidal Thoughts:  Yes.  with intent/plan  Homicidal Thoughts:  No  Memory:  Immediate;   Fair Recent;   Poor Remote;   Fair  Judgement:  Impaired  Insight:  Fair  Psychomotor Activity:  Decreased  Concentration:  Concentration: Fair  Recall:  [employer] of Knowledge:  Fair  Language:  Fair  Akathisia:  No  Handed:  Right  AIMS (if indicated):     Assets:  Desire for Improvement Housing Social Support  ADL's:  Intact  Cognition:  Impaired,  Mild  Sleep:        Treatment Plan Summary: Daily contact with patient to assess and evaluate symptoms and progress in treatment, Medication management and Plan 76 year old woman with multiple medical problems presents with severe major depression with hallucinations and suicidal ideation.  Because she is oxygen dependent she is not a candidate for  admission to the psychiatric unit here.  Patient is under IVC.  I will try and make sure we continue her current medicine and start some antipsychotics for  later in the day particularly.  Case reviewed with TTS and I recommend referral to inpatient geriatric psychiatry.  Disposition: Recommend psychiatric Inpatient admission when medically cleared. Supportive therapy provided about ongoing stressors.  Alethia Berthold, MD 12/09/2016 6:43 PM

## 2016-12-09 NOTE — ED Notes (Signed)
Pharmacy just sent the Pt's 19:00 medication.

## 2016-12-09 NOTE — ED Notes (Signed)
Pt's daughter called to get an update on the Pt. Pt's daughter provided the nurse with the password and an update on the Pt was given.

## 2016-12-09 NOTE — ED Notes (Signed)
Garage door open at this time as pt is on O2.

## 2016-12-09 NOTE — ED Notes (Signed)
Daughter Teressa Senter [phone] cell and [phone] home.

## 2016-12-09 NOTE — ED Provider Notes (Addendum)
[facility] Emergency Department Provider Note  ____________________________________________   MD Initiated Contact with Patient 12/09/16 1634     (approximate)  I have reviewed the triage vital signs and the nursing notes.   HISTORY  Chief Complaint Altered Mental Status and Headache   HPI Bonnie Henry is a 76 y.o. female with a history of COPD on nasal cannula oxygen as well as depression was presented to the emergency department with suicidal ideation.  The patient says that she "just does not want to be here anymore."  Says that she also is having intermittent headaches but is been having these since January 1 and has suffered multiple falls.  However, she does not report any recent falls and says that she has had imaging which did not reveal any brain masses.  She has not reporting any headache pain at this time.  Denies any homicidal ideation.  Denies any specific plan to kill herself and has not attempted any suicidal actions.  However, she has been admitted to a psychiatric for similar symptoms.  She denies any drinking or drug use.   Past Medical History:  Diagnosis Date  . Anemia   . Anxiety   . Arthritis    "joints ache all over" . osteoarthritis  . Asthma   . Atrial fibrillation ([place])   . Breast cancer, left breast (HCC)    S/P mastectomy  . CAD (coronary artery disease)   . CHF (congestive heart failure) ([place])   . Chronic back pain   . Constipation 03/04/2015  . COPD (chronic obstructive pulmonary disease) ([place])   . Depression, major   . Diabetes mellitus without complication ([place])   . DVT (deep venous thrombosis) ([place])   . GERD (gastroesophageal reflux disease)   . Headache   . Heart murmur   . Hepatitis    HEP "C"  . History of blood transfusion    "when I had my mastectomy"  . History of hiatal hernia   . History of right-sided carotid endarterectomy 06/10/2015  . Hypercholesteremia   . Hypertension   . Morbid obesity  ([place])   . Myocardial infarction ([place])   . Neuropathy   . Peripheral vascular disease ([place])   . Post-surgical hypothyroidism   . Restless leg syndrome   . Shortness of breath dyspnea   . Sleep apnea    "2015 wore mask; lost weight; told didn't need mask anymore" (02/23/2014)  . Type II diabetes mellitus [facility])     Patient Active Problem List   Diagnosis Date Noted  . Facet arthropathy, lumbar 11/21/2016  . Hyperlipidemia due to type 2 diabetes mellitus ([place]) 11/03/2016  . Cognitive impairment 08/26/2016  . Multifactorial gait disorder 08/26/2016  . Diabetic polyneuropathy associated with diabetes mellitus due to underlying condition ([place]) 08/26/2016  . Osteoarthritis of right knee 07/23/2016  . Graves' disease with exophthalmos 05/29/2016  . Medication monitoring encounter 05/26/2016  . Renal insufficiency 08/03/2015  . Anemia, iron deficiency 07/22/2015  . Metabolic encephalopathy 15/40/0867  . Chronic respiratory failure with hypoxia ([place]) 07/21/2015  . History of right-sided carotid endarterectomy 06/10/2015  . Carotid stenosis, right 05/16/2015  . Low back pain 05/04/2015  . Chronic nonintractable headache 03/30/2015  . Memory loss, short term 03/30/2015  . Patellar subluxation 03/29/2015  . Constipation 03/04/2015  . Lumbar stenosis with neurogenic claudication 02/23/2015  . DDD (degenerative disc disease), lumbar 02/23/2015  . Cervical radiculitis 02/23/2015  . Encounter for medication monitoring 02/20/2015  . Knee pain, bilateral 02/08/2015  .  Frequent falls 01/22/2015  . 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 Chronic interstitial lung disease ([place]) 10/26/2014  . Centrilobular emphysema ([place]) 10/26/2014  . Vision blurred 09/28/2014  . Acquired exophthalmos 09/28/2014  . Diabetes mellitus with diabetic neuropathy ([place]) 08/31/2014  . Hammertoe 08/31/2014  . Foot callus 08/31/2014  . 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 Anemia 05/21/2014  . Chronic pain 03/02/2014    . Depression 03/02/2014  . Acid reflux 03/02/2014  . Glaucoma 03/02/2014  . Adult hypothyroidism 03/02/2014  . Apnea, sleep 03/02/2014  . PAT (paroxysmal atrial tachycardia) ([place]) 02/24/2014  . Unstable angina ([place]) 02/23/2014  . Type 2 diabetes with nephropathy ([place]) 02/23/2014  . HTN (hypertension) 02/23/2014  . CAD S/P remote PCI  02/23/2014  . High cholesterol 02/23/2014  . PVD- h/o LCEA 02/23/2014  . Morbid obesity ([place]) 02/23/2014  . Aortic stenosis murmur 02/23/2014  . COPD, moderate ([place]) 07/27/2013    Past Surgical History:  Procedure Laterality Date  . ARTERY BIOPSY Right 09/29/2014   Procedure: BIOPSY TEMPORAL ARTERY;  Surgeon: Angelia Mould, MD;  Location: [place];  Service: Vascular;  Laterality: Right;  . BARTHOLIN CYST MARSUPIALIZATION    . BREAST BIOPSY Left    Mastectomy  . CARDIAC CATHETERIZATION  2000's  . CAROTID ENDARTERECTOMY Left 2009  . CATARACT EXTRACTION W/ INTRAOCULAR LENS  IMPLANT, BILATERAL Bilateral ~ 2011  . CORONARY ANGIOPLASTY WITH STENT PLACEMENT  1990's X 2   "2; 1"  . ENDARTERECTOMY Right 05/16/2015   Procedure: ENDARTERECTOMY CAROTID;  Surgeon: Katha Cabal, MD;  Location: [facility];  Service: Vascular;  Laterality: Right;  . ESOPHAGOGASTRODUODENOSCOPY (EGD) WITH PROPOFOL N/A 12/12/2014   Procedure: ESOPHAGOGASTRODUODENOSCOPY (EGD) WITH PROPOFOL;  Surgeon: Lucilla Lame, MD;  Location: [facility] ENDOSCOPY;  Service: Endoscopy;  Laterality: N/A;  . EYE SURGERY Bilateral    Catract Extraction with IOL  . HAMMER TOE SURGERY Left    2nd and 3rd digits  . JOINT REPLACEMENT Bilateral    Total Hip Replacement  . JOINT REPLACEMENT Left    Total Knee Replacement  . LEFT HEART CATHETERIZATION WITH CORONARY ANGIOGRAM N/A 02/24/2014   Procedure: LEFT HEART CATHETERIZATION WITH CORONARY ANGIOGRAM;  Surgeon: Leonie Man, MD;  Location: [facility] CATH LAB;  Service: Cardiovascular;  Laterality: N/A;  . MASTECTOMY Left 1990's  . SHOULDER SURGERY Right     "cleaned it out; no scope"  . THYROIDECTOMY, PARTIAL  1950's  . TOE SURGERY Right    "cut piece of bone out so toe didn't dig into my foot"  . TONSILLECTOMY  1950's  . TOTAL HIP ARTHROPLASTY Bilateral 1990's  . TOTAL KNEE ARTHROPLASTY Left 1990's  . TRIGGER FINGER RELEASE Left   . TUBAL LIGATION    . VAGINAL HYSTERECTOMY     "partial"    Prior to Admission medications   Medication Sig Start Date End Date Taking? Authorizing Provider  acetaminophen (TYLENOL) 500 MG tablet Take 2 tablets (1,000 mg total) by mouth every 6 (six) hours as needed for moderate pain. 05/08/15   Duffy Bruce, MD  albuterol (ACCUNEB) 0.63 MG/3ML nebulizer solution Take 1 ampule by nebulization every 6 (six) hours as needed for wheezing or shortness of breath.     [provider]  albuterol (PROAIR HFA) 108 (90 BASE) MCG/ACT inhaler Inhale 2 puffs into the lungs every 4 (four) hours as needed for wheezing or shortness of breath.     [provider]  Ascorbic Acid (VITAMIN C PO) Take 1 tablet by mouth 3 (three)  times a week.     [provider]  aspirin EC 81 MG tablet Take 81 mg by mouth daily.    [provider]  atorvastatin (LIPITOR) 80 MG tablet Take 1 tablet (80 mg total) by mouth at bedtime. 08/07/16   Lada, Satira Anis, MD  budesonide (PULMICORT) 0.5 MG/2ML nebulizer solution Take 0.5 mg by nebulization 2 (two) times daily.     [provider]  bumetanide (BUMEX) 0.5 MG tablet Take 0.5 mg by mouth as needed.     [provider]  busPIRone (BUSPAR) 15 MG tablet TAKE ONE TABLET BY MOUTH TWICE DAILY 12/04/16   Arnetha Courser, MD  carvedilol (COREG) 12.5 MG tablet Take 12.5 mg by mouth 2 (two) times daily with a meal.  01/24/14   [provider]  DULoxetine (CYMBALTA) 60 MG capsule Take 1 capsule (60 mg total) by mouth daily. 08/22/16   Arnetha Courser, MD  fluticasone (FLONASE) 50 MCG/ACT nasal spray Place 2 sprays into both nostrils as needed for  allergies.  10/16/14   [provider]  formoterol (PERFOROMIST) 20 MCG/2ML nebulizer solution Inhale 20 mcg into the lungs 2 (two) times daily.     [provider]  gabapentin (NEURONTIN) 600 MG tablet Take 1 tablet (600 mg total) 2 (two) times daily by mouth. 11/21/16   Lada, Satira Anis, MD  Hypromellose (ARTIFICIAL TEARS OP) Place 1 drop into both eyes as needed (dry eyes).     [provider]  insulin NPH Human (HUMULIN N,NOVOLIN N) 100 UNIT/ML injection Inject 24 Units into the skin at bedtime.  06/14/15 10/08/16  [provider]  insulin regular (NOVOLIN R,HUMULIN R) 100 units/mL injection Inject 28 Units 3 (three) times daily before meals into the skin. Based on sliding scale     [provider]  levothyroxine (SYNTHROID, LEVOTHROID) 100 MCG tablet Take 1 tablet (100 mcg total) by mouth every other day. (alternate with the 112 mcg strength) Patient taking differently: Take 100 mcg by mouth See admin instructions. Take 1 tablet (100 mcg) by mouth before breakfast Monday thru Thursday (take 112 mcg other days) 09/08/14   Arnetha Courser, MD  levothyroxine (SYNTHROID, LEVOTHROID) 112 MCG tablet Take 1 tablet (112 mcg total) by mouth every other day. (alternate with the 100 mcg strength) Patient taking differently: Take 112 mcg by mouth See admin instructions. Take 1 tablet (112 mcg) by mouth before breakfast on Friday, Saturday, Sunday (take 100 mcg other days) 09/08/14   Arnetha Courser, MD  meclizine (ANTIVERT) 25 MG tablet Take 0.5-1 tablets (12.5-25 mg total) by mouth 3 (three) times daily as needed for dizziness. 05/29/16   Lada, Satira Anis, MD  montelukast (SINGULAIR) 10 MG tablet Take 1 tablet (10 mg total) by mouth daily. 12/27/15   Lada, Satira Anis, MD  nitroGLYCERIN (NITROSTAT) 0.4 MG SL tablet Place 1 tablet (0.4 mg total) every 5 (five) minutes as needed under the tongue for chest pain. Max of 3 pills total, call 911 11/21/16   Lada, Satira Anis, MD    Omega-3 Fatty Acids (FISH OIL) 1200 MG CAPS Take 1 capsule by mouth 2 (two) times a week.     [provider]  oxyCODONE-acetaminophen (ROXICET) 5-325 MG tablet Take 0.5-1 tablets every 6 (six) hours as needed by mouth for moderate pain or severe pain. 11/21/16   Arnetha Courser, MD  OXYGEN Inhale 2 L into the lungs continuous.    [provider]  pantoprazole (PROTONIX) 20  MG tablet TAKE ONE TABLET BY MOUTH TWICE DAILY 06/03/16   Lada, Satira Anis, MD  ranitidine (ZANTAC) 300 MG tablet Take 1 tablet (300 mg total) by mouth at bedtime. 01/15/16   Lada, Satira Anis, MD  rOPINIRole (REQUIP) 1 MG tablet TAKE 1 TABLET BY MOUTH THREE TIMES DAILY 11/13/16   Arnetha Courser, MD  spironolactone (ALDACTONE) 25 MG tablet Take 25 mg by mouth daily. 08/27/14   [provider]  tiZANidine (ZANAFLEX) 2 MG tablet TAKE 1 TABLET BY MOUTH AT BEDTIME 09/17/16   Narda Amber K, DO    Allergies Vasotec [enalapril]; Codeine; and Morphine and related  Family History  Problem Relation Age of Onset  . Arthritis Mother   . Cancer Mother   . Diabetes Mother   . Heart disease Mother   . Hyperlipidemia Mother   . Hypertension Mother   . Alcohol abuse Father   . Brain cancer Father   . Arthritis Maternal Grandmother   . Alzheimer's disease Maternal Grandmother   . Diabetes Daughter   . Hyperlipidemia Daughter   . Hypertension Daughter   . Thyroid cancer Daughter   . Diabetes Son   . Diabetes Daughter   . Hypertension Daughter   . Diabetes Son   . Hypertension Son   . Hyperlipidemia Son   . Thyroid cancer Son   . Diabetes Brother   . Diabetes Brother     Social History Social History   Tobacco Use  . Smoking status: Former Smoker    Packs/day: 1.50    Years: 44.00    Pack years: 66.00    Types: Cigarettes    Last attempt to quit: 01/06/1994    Years since quitting: 22.9  . Smokeless tobacco: Never Used  . Tobacco comment: "quit smoking cigarettes in ~ 2000"  Substance Use  Topics  . Alcohol use: No    Alcohol/week: 0.0 oz    Comment: 02/23/2014 "no alcohol since 1990's"  . Drug use: No    Review of Systems  Constitutional: No fever/chills Eyes: No visual changes. ENT: No sore throat. Cardiovascular: Denies chest pain. Respiratory: Patient says that she has been having a cough lately with increased shortness of breath. Gastrointestinal: No abdominal pain.  No nausea, no vomiting.  No diarrhea.  No constipation. Genitourinary: Negative for dysuria. Musculoskeletal: Negative for back pain. Skin: Negative for rash. Neurological: Negative for focal weakness or numbness.   ____________________________________________   PHYSICAL EXAM:  VITAL SIGNS: ED Triage Vitals  Enc Vitals Group     BP 12/09/16 1605 (!) 147/58     Pulse Rate 12/09/16 1605 68     Resp 12/09/16 1605 18     Temp 12/09/16 1605 98 F (36.7 C)     Temp Source 12/09/16 1605 Oral     SpO2 12/09/16 1605 100 %     Weight 12/09/16 1606 205 lb (93 kg)     Height 12/09/16 1606 4\' 9"  (1.448 m)     Head Circumference --      Peak Flow --      Pain Score 12/09/16 1611 0     Pain Loc --      Pain Edu? --      Excl. in [place]? --     Constitutional: Alert and oriented. Well appearing and in no acute distress. Eyes: Conjunctivae are normal.  Head: Atraumatic. Nose: No congestion/rhinnorhea.  Wearing nasal cannula. Mouth/Throat: Mucous membranes are moist.  Neck: No stridor.   Cardiovascular: Normal rate, regular  rhythm. Grossly normal heart sounds.  Good peripheral circulation. Respiratory: Normal respiratory effort.  No retractions.  Mild wheezing throughout with slightly increased wheeze on the left.  Speaking in full sentences.  No respiratory distress. Gastrointestinal: Soft and nontender. No distention. Musculoskeletal: No lower extremity tenderness nor edema.  No joint effusions. Neurologic:  Normal speech and language. No gross focal neurologic deficits are appreciated. Skin:  Skin  is warm, dry and intact. No rash noted. Psychiatric: Depressed mood and tearful when speaking about her suicidal ideation.  ____________________________________________   LABS (all labs ordered are listed, but only abnormal results are displayed)  Labs Reviewed  COMPREHENSIVE METABOLIC PANEL - Abnormal; Notable for the following components:      Result Value   Glucose, Bld 173 (*)    Creatinine, Ser 1.08 (*)    Total Protein 8.2 (*)    Alkaline Phosphatase 157 (*)    GFR calc non Af Amer 49 (*)    GFR calc Af Amer 57 (*)    All other components within normal limits  CBC  ETHANOL  SALICYLATE LEVEL  ACETAMINOPHEN LEVEL  URINE DRUG SCREEN, QUALITATIVE ([facility] ONLY)   ____________________________________________  EKG   ____________________________________________  RADIOLOGY  Pending chest x-ray ____________________________________________   PROCEDURES  Procedure(s) performed:   Procedures  Critical Care performed:   ____________________________________________   INITIAL IMPRESSION / ASSESSMENT AND PLAN / ED COURSE  Pertinent labs & imaging results that were available during my care of the patient were reviewed by me and considered in my medical decision making (see chart for details).  DDX: Depression, suicidal ideation, COPD exacerbation, pneumonia  As part of my medical decision making, I reviewed the following data within the [place] chart reviewed  Patient to be involuntarily committed and seen by psychiatry.  We will also give 2 duo nebs and reassess as well as order chest x-ray.  The patient is understanding of this plan willing to comply.      ____________________________________________   FINAL CLINICAL IMPRESSION(S) / ED DIAGNOSES  COPD exacerbation.  Suicidal ideation.    NEW MEDICATIONS STARTED DURING THIS VISIT:  This SmartLink is deprecated. Use AVSMEDLIST instead to display the medication list for a  patient.   Note:  This document was prepared using Dragon voice recognition software and may include unintentional dictation errors.     Orbie Pyo, MD 12/09/16 [place], Randall An, MD 12/09/16 [phone]

## 2016-12-09 NOTE — ED Notes (Addendum)
Pt dressed out into appropriate behavioral health clothing. Pt belongings consist of purple jacket, gray sweat pants, black shoes, white socks, a blue shirt with pink and white flowers and green panties. Pt gave ring to daughter to take home with her. Pts daughter also took all belongings home with her including O2 tank.

## 2016-12-09 NOTE — ED Notes (Signed)
Meal tray and 4 oz drink given to pt.

## 2016-12-10 ENCOUNTER — Other Ambulatory Visit: Payer: Self-pay | Admitting: Licensed Clinical Social Worker

## 2016-12-10 DIAGNOSIS — F329 Major depressive disorder, single episode, unspecified: Secondary | ICD-10-CM | POA: Diagnosis not present

## 2016-12-10 DIAGNOSIS — F333 Major depressive disorder, recurrent, severe with psychotic symptoms: Secondary | ICD-10-CM | POA: Diagnosis not present

## 2016-12-10 LAB — GLUCOSE, CAPILLARY
GLUCOSE-CAPILLARY: 192 mg/dL — AB (ref 65–99)
GLUCOSE-CAPILLARY: 237 mg/dL — AB (ref 65–99)
Glucose-Capillary: 225 mg/dL — ABNORMAL HIGH (ref 65–99)
Glucose-Capillary: 333 mg/dL — ABNORMAL HIGH (ref 65–99)

## 2016-12-10 NOTE — ED Notes (Signed)
Spoke with patient's daughter, Feliberto Gottron on the phone.  Her contact numbers are: Home:  [phone] Mobile:  [phone]

## 2016-12-10 NOTE — ED Notes (Signed)
CBG performed. 225 RN notified.

## 2016-12-10 NOTE — ED Notes (Signed)
ED BHU [place] Is the patient under IVC or is there intent for IVC: Yes.   Is the patient medically cleared: Yes.   Is there vacancy in the ED BHU: Yes.   Is the population mix appropriate for patient: Yes.   Is the patient awaiting placement in inpatient or outpatient setting: Yes.   Has the patient had a psychiatric consult: Yes.   Survey of unit performed for contraband, proper placement and condition of furniture, tampering with fixtures in bathroom, shower, and each patient room: Yes.  ; Findings: Environment secure.  APPEARANCE/BEHAVIOR calm, cooperative and adequate rapport can be established NEURO ASSESSMENT Orientation: time, place and person Hallucinations: No.None noted (Hallucinations) Speech: Normal Gait: normal RESPIRATORY ASSESSMENT Normal expansion.  Clear to auscultation.  No rales, rhonchi, or wheezing., No chest wall tenderness., No kyphosis or scoliosis. CARDIOVASCULAR ASSESSMENT regular rate and rhythm, S1, S2 normal, no murmur, click, rub or gallop GASTROINTESTINAL ASSESSMENT soft, nontender, BS WNL, no r/g EXTREMITIES normal strength, tone, and muscle mass, no deformities, no erythema, induration, or nodules, no evidence of joint effusion, no evidence of joint instability PLAN OF CARE Provide calm/safe environment. Vital signs assessed twice daily. ED BHU Assessment once each 12-hour shift. Collaborate with intake RN daily or as condition indicates. Assure the ED provider has rounded once each shift. Provide and encourage hygiene. Provide redirection as needed. Assess for escalating behavior; address immediately and inform ED provider.  Assess family dynamic and appropriateness for visitation as needed: Yes.  ; If necessary, describe findings: Family is involved on the Pt's care.  Educate the patient/family about BHU procedures/visitation: Yes.  ; If necessary, describe findings: Pt and family understand the rules of the unit.

## 2016-12-10 NOTE — Consult Note (Signed)
[facility] Psychiatry Consult   Reason for Consult: Follow-up note for 76 year old woman with major depression Referring Physician: Joni Fears Patient Identification: Bonnie Henry MRN:  [id number] Principal Diagnosis: Depression, major, recurrent, severe with psychosis ([place]) Diagnosis:   Patient Active Problem List   Diagnosis Date Noted  . Depression, major, recurrent, severe with psychosis ([place]) [F33.3] 12/09/2016  . Mild dementia [F03.90] 12/09/2016  . Aortic atherosclerosis ([place]) [I70.0] 12/09/2016  . Facet arthropathy, lumbar [M47.816] 11/21/2016  . Hyperlipidemia due to type 2 diabetes mellitus ([place]) [E11.69, E78.5] 11/03/2016  . Cognitive impairment [R41.89] 08/26/2016  . Multifactorial gait disorder [R26.89] 08/26/2016  . Diabetic polyneuropathy associated with diabetes mellitus due to underlying condition ([place]) [E08.42] 08/26/2016  . Osteoarthritis of right knee [M17.11] 07/23/2016  . Graves' disease with exophthalmos [E05.00] 05/29/2016  . Medication monitoring encounter [Z51.81] 05/26/2016  . Renal insufficiency [N28.9] 08/03/2015  . Anemia, iron deficiency [D50.9] 07/22/2015  . Metabolic encephalopathy [H85.27] 07/21/2015  . Chronic respiratory failure with hypoxia ([place]) [J96.11] 07/21/2015  . History of right-sided carotid endarterectomy [Z98.890] 06/10/2015  . Carotid stenosis, right [I65.21] 05/16/2015  . Low back pain [M54.5] 05/04/2015  . Chronic nonintractable headache [R51] 03/30/2015  . Memory loss, short term [R41.3] 03/30/2015  . Patellar subluxation [S83.003A] 03/29/2015  . Constipation [K59.00] 03/04/2015  . Lumbar stenosis with neurogenic claudication [M48.062] 02/23/2015  . DDD (degenerative disc disease), lumbar [M51.36] 02/23/2015  . Cervical radiculitis [M54.12] 02/23/2015  . Encounter for medication monitoring [Z51.81] 02/20/2015  . Knee pain, bilateral [M25.561, M25.562] 02/08/2015  . Frequent falls [R29.6] 01/22/2015  . Diffuse cerebral  atrophy [G31.9] 10/31/2014  . Chronic interstitial lung disease ([place]) [J84.9] 10/26/2014  . Centrilobular emphysema ([place]) [J43.2] 10/26/2014  . Vision blurred [H53.8] 09/28/2014  . Acquired exophthalmos [H05.20] 09/28/2014  . Diabetes mellitus with diabetic neuropathy ([place]) [E11.40] 08/31/2014  . Hammertoe [M20.40] 08/31/2014  . Foot callus [L84] 08/31/2014  . Shoulder girdle syndrome [G54.5] 07/06/2014  . Anemia [D64.9] 05/21/2014  . Chronic pain [G89.29] 03/02/2014  . Depression [F32.9] 03/02/2014  . Acid reflux [K21.9] 03/02/2014  . Glaucoma [H40.9] 03/02/2014  . Adult hypothyroidism [E03.9] 03/02/2014  . Apnea, sleep [G47.30] 03/02/2014  . PAT (paroxysmal atrial tachycardia) ([place]) [I47.1] 02/24/2014  . Unstable angina ([place]) [I20.0] 02/23/2014  . Type 2 diabetes with nephropathy ([place]) [E11.21] 02/23/2014  . HTN (hypertension) [I10] 02/23/2014  . CAD S/P remote PCI  [I25.10, Z98.61] 02/23/2014  . High cholesterol [E78.00] 02/23/2014  . PVD- h/o LCEA [I73.9] 02/23/2014  . Morbid obesity ([place]) [E66.01] 02/23/2014  . Aortic stenosis murmur [I35.0] 02/23/2014  . COPD, moderate ([place]) [J44.9] 07/27/2013    Total Time spent with patient: 30 minutes  Subjective:   THRESEA DOBLE is a 76 y.o. female patient admitted with "I am not doing so good".  HPI: Patient interviewed chart reviewed.  See note from yesterday.  76 year old woman with a history of depression presented to the emergency room with multiple symptoms of major depression including recent visual hallucinations.  She was tearful and depressed and did not feel safe going home because of ongoing suicidal thoughts.  Because of her chronic need for oxygen treatment she was not appropriate for admission to the psychiatric unit here.  Patient does not have any new complaints has been compliant with treatment.  Past Psychiatric History: Past history of recurrent episodes of depression  Risk to Self: Is patient at risk for  suicide?: Yes Risk to Others:   Prior Inpatient Therapy:   Prior Outpatient Therapy:  Past Medical History:  Past Medical History:  Diagnosis Date  . Anemia   . Anxiety   . Aortic atherosclerosis ([place]) 12/09/2016   Chest CT Dec 2018  . Arthritis    "joints ache all over" . osteoarthritis  . Asthma   . Atrial fibrillation ([place])   . Breast cancer, left breast (H[facility])    S/P mastectomy  . CAD (coronary artery disease)   . CHF (congestive heart failure) ([place])   . Chronic back pain   . Constipation 03/04/2015  . COPD (chronic obstructive pulmonary disease) ([place])   . Depression, major   . Diabetes mellitus without complication ([place])   . DVT (deep venous thrombosis) ([place])   . GERD (gastroesophageal reflux disease)   . Headache   . Heart murmur   . Hepatitis    HEP "C"  . History of blood transfusion    "when I had my mastectomy"  . History of hiatal hernia   . History of right-sided carotid endarterectomy 06/10/2015  . Hypercholesteremia   . Hypertension   . Morbid obesity ([place])   . Myocardial infarction ([place])   . Neuropathy   . Peripheral vascular disease ([place])   . Post-surgical hypothyroidism   . Restless leg syndrome   . Shortness of breath dyspnea   . Sleep apnea    "2015 wore mask; lost weight; told didn't need mask anymore" (02/23/2014)  . Type II diabetes mellitus ([place])     Past Surgical History:  Procedure Laterality Date  . ARTERY BIOPSY Right 09/29/2014   Procedure: BIOPSY TEMPORAL ARTERY;  Surgeon: Angelia Mould, MD;  Location: [facility];  Service: Vascular;  Laterality: Right;  . BARTHOLIN CYST MARSUPIALIZATION    . BREAST BIOPSY Left    Mastectomy  . CARDIAC CATHETERIZATION  2000's  . CAROTID ENDARTERECTOMY Left 2009  . CATARACT EXTRACTION W/ INTRAOCULAR LENS  IMPLANT, BILATERAL Bilateral ~ 2011  . CORONARY ANGIOPLASTY WITH STENT PLACEMENT  1990's X 2   "2; 1"  . ENDARTERECTOMY Right 05/16/2015   Procedure: ENDARTERECTOMY CAROTID;  Surgeon: Katha Cabal, MD;  Location: [facility];  Service: Vascular;  Laterality: Right;  . ESOPHAGOGASTRODUODENOSCOPY (EGD) WITH PROPOFOL N/A 12/12/2014   Procedure: ESOPHAGOGASTRODUODENOSCOPY (EGD) WITH PROPOFOL;  Surgeon: Lucilla Lame, MD;  Location: [facility];  Service: Endoscopy;  Laterality: N/A;  . EYE SURGERY Bilateral    Catract Extraction with IOL  . HAMMER TOE SURGERY Left    2nd and 3rd digits  . JOINT REPLACEMENT Bilateral    Total Hip Replacement  . JOINT REPLACEMENT Left    Total Knee Replacement  . LEFT HEART CATHETERIZATION WITH CORONARY ANGIOGRAM N/A 02/24/2014   Procedure: LEFT HEART CATHETERIZATION WITH CORONARY ANGIOGRAM;  Surgeon: Leonie Man, MD;  Location: [facility] CATH LAB;  Service: Cardiovascular;  Laterality: N/A;  . MASTECTOMY Left 1990's  . SHOULDER SURGERY Right    "cleaned it out; no scope"  . THYROIDECTOMY, PARTIAL  1950's  . TOE SURGERY Right    "cut piece of bone out so toe didn't dig into my foot"  . TONSILLECTOMY  1950's  . TOTAL HIP ARTHROPLASTY Bilateral 1990's  . TOTAL KNEE ARTHROPLASTY Left 1990's  . TRIGGER FINGER RELEASE Left   . TUBAL LIGATION    . VAGINAL HYSTERECTOMY     "partial"   Family History:  Family History  Problem Relation Age of Onset  . Arthritis Mother   . Cancer Mother   . Diabetes Mother   . Heart disease Mother   .  Hyperlipidemia Mother   . Hypertension Mother   . Alcohol abuse Father   . Brain cancer Father   . Arthritis Maternal Grandmother   . Alzheimer's disease Maternal Grandmother   . Diabetes Daughter   . Hyperlipidemia Daughter   . Hypertension Daughter   . Thyroid cancer Daughter   . Diabetes Son   . Diabetes Daughter   . Hypertension Daughter   . Diabetes Son   . Hypertension Son   . Hyperlipidemia Son   . Thyroid cancer Son   . Diabetes Brother   . Diabetes Brother    Family Psychiatric  History: Depression Social History:  Social History   Substance and Sexual Activity  Alcohol Use No  .  Alcohol/week: 0.0 oz   Comment: 02/23/2014 "no alcohol since 1990's"     Social History   Substance and Sexual Activity  Drug Use No    Social History   Socioeconomic History  . Marital status: Widowed    Spouse name: None  . Number of children: None  . Years of education: None  . Highest education level: None  Social Needs  . Financial resource strain: None  . Food insecurity - worry: None  . Food insecurity - inability: None  . Transportation needs - medical: None  . Transportation needs - non-medical: None  Occupational History  . None  Tobacco Use  . Smoking status: Former Smoker    Packs/day: 1.50    Years: 44.00    Pack years: 66.00    Types: Cigarettes    Last attempt to quit: 01/06/1994    Years since quitting: 22.9  . Smokeless tobacco: Never Used  . Tobacco comment: "quit smoking cigarettes in ~ 2000"  Substance and Sexual Activity  . Alcohol use: No    Alcohol/week: 0.0 oz    Comment: 02/23/2014 "no alcohol since 1990's"  . Drug use: No  . Sexual activity: No  Other Topics Concern  . None  Social History Narrative   Lives with daughter in a mobile home.  Has 4 children alive, 1 deceased.  Retired from [employer].  Education: high school.   Additional Social History:    Allergies:   Allergies  Allergen Reactions  . Vasotec [Enalapril] Swelling    Throat swells  . Codeine Nausea And Vomiting  . Morphine And Related Nausea And Vomiting    Labs:  Results for orders placed or performed during the hospital encounter of 12/09/16 (from the past 48 hour(s))  Comprehensive metabolic panel     Status: Abnormal   Collection Time: 12/09/16  4:11 PM  Result Value Ref Range   Sodium 139 135 - 145 mmol/L   Potassium 3.8 3.5 - 5.1 mmol/L   Chloride 101 101 - 111 mmol/L   CO2 27 22 - 32 mmol/L   Glucose, Bld 173 (H) 65 - 99 mg/dL   BUN 16 6 - 20 mg/dL   Creatinine, Ser 1.08 (H) 0.44 - 1.00 mg/dL   Calcium 9.2 8.9 - 10.3 mg/dL   Total Protein 8.2 (H) 6.5 - 8.1  g/dL   Albumin 4.4 3.5 - 5.0 g/dL   AST 31 15 - 41 U/L   ALT 19 14 - 54 U/L   Alkaline Phosphatase 157 (H) 38 - 126 U/L   Total Bilirubin 1.2 0.3 - 1.2 mg/dL   GFR calc non Af Amer 49 (L) >60 mL/min   GFR calc Af Amer 57 (L) >60 mL/min    Comment: (NOTE) The eGFR has been  calculated using the CKD EPI equation. This calculation has not been validated in all clinical situations. eGFR's persistently <60 mL/min signify possible Chronic Kidney Disease.    Anion gap 11 5 - 15  Ethanol     Status: None   Collection Time: 12/09/16  4:11 PM  Result Value Ref Range   Alcohol, Ethyl (B) <10 <10 mg/dL    Comment:        LOWEST DETECTABLE LIMIT FOR SERUM ALCOHOL IS 10 mg/dL FOR MEDICAL PURPOSES ONLY   Salicylate level     Status: None   Collection Time: 12/09/16  4:11 PM  Result Value Ref Range   Salicylate Lvl <5.2 2.8 - 30.0 mg/dL  Acetaminophen level     Status: Abnormal   Collection Time: 12/09/16  4:11 PM  Result Value Ref Range   Acetaminophen (Tylenol), Serum <10 (L) 10 - 30 ug/mL    Comment:        THERAPEUTIC CONCENTRATIONS VARY SIGNIFICANTLY. A RANGE OF 10-30 ug/mL MAY BE AN EFFECTIVE CONCENTRATION FOR MANY PATIENTS. HOWEVER, SOME ARE BEST TREATED AT CONCENTRATIONS OUTSIDE THIS RANGE. ACETAMINOPHEN CONCENTRATIONS >150 ug/mL AT 4 HOURS AFTER INGESTION AND >50 ug/mL AT 12 HOURS AFTER INGESTION ARE OFTEN ASSOCIATED WITH TOXIC REACTIONS.   cbc     Status: None   Collection Time: 12/09/16  4:11 PM  Result Value Ref Range   WBC 8.7 3.6 - 11.0 K/uL   RBC 4.10 3.80 - 5.20 MIL/uL   Hemoglobin 12.1 12.0 - 16.0 g/dL   HCT 36.0 35.0 - 47.0 %   MCV 87.8 80.0 - 100.0 fL   MCH 29.5 26.0 - 34.0 pg   MCHC 33.7 32.0 - 36.0 g/dL   RDW 13.9 11.5 - 14.5 %   Platelets 204 150 - 440 K/uL  Urine Drug Screen, Qualitative     Status: None   Collection Time: 12/09/16  4:11 PM  Result Value Ref Range   Tricyclic, Ur Screen NONE DETECTED NONE DETECTED   Amphetamines, Ur Screen NONE  DETECTED NONE DETECTED   MDMA (Ecstasy)Ur Screen NONE DETECTED NONE DETECTED   Cocaine Metabolite,Ur [place] NONE DETECTED NONE DETECTED   Opiate, Ur Screen NONE DETECTED NONE DETECTED   Phencyclidine (PCP) Ur S NONE DETECTED NONE DETECTED   Cannabinoid 50 Ng, Ur [place] NONE DETECTED NONE DETECTED   Barbiturates, Ur Screen NONE DETECTED NONE DETECTED   Benzodiazepine, Ur Scrn NONE DETECTED NONE DETECTED   Methadone Scn, Ur NONE DETECTED NONE DETECTED    Comment: (NOTE) 778  Tricyclics, urine               Cutoff 1000 ng/mL 200  Amphetamines, urine             Cutoff 1000 ng/mL 300  MDMA (Ecstasy), urine           Cutoff 500 ng/mL 400  Cocaine Metabolite, urine       Cutoff 300 ng/mL 500  Opiate, urine                   Cutoff 300 ng/mL 600  Phencyclidine (PCP), urine      Cutoff 25 ng/mL 700  Cannabinoid, urine              Cutoff 50 ng/mL 800  Barbiturates, urine             Cutoff 200 ng/mL 900  Benzodiazepine, urine           Cutoff 200 ng/mL 1000 Methadone, urine  Cutoff 300 ng/mL 1100 1200 The urine drug screen provides only a preliminary, unconfirmed 1300 analytical test result and should not be used for non-medical 1400 purposes. Clinical consideration and professional judgment should 1500 be applied to any positive drug screen result due to possible 1600 interfering substances. A more specific alternate chemical method 1700 must be used in order to obtain a confirmed analytical result.  1800 Gas chromato graphy / mass spectrometry (GC/MS) is the preferred 1900 confirmatory method.   Hemoglobin A1c     Status: Abnormal   Collection Time: 12/09/16  4:11 PM  Result Value Ref Range   Hgb A1c MFr Bld 8.2 (H) 4.8 - 5.6 %    Comment: (NOTE) Pre diabetes:          5.7%-6.4% Diabetes:              >6.4% Glycemic control for   <7.0% adults with diabetes    Mean Plasma Glucose 188.64 mg/dL    Comment: Performed at [facility] [address], [place], [place]  [address]  Glucose, capillary     Status: Abnormal   Collection Time: 12/09/16  9:01 PM  Result Value Ref Range   Glucose-Capillary 180 (H) 65 - 99 mg/dL  Glucose, capillary     Status: Abnormal   Collection Time: 12/10/16  7:59 AM  Result Value Ref Range   Glucose-Capillary 225 (H) 65 - 99 mg/dL  Glucose, capillary     Status: Abnormal   Collection Time: 12/10/16 12:12 PM  Result Value Ref Range   Glucose-Capillary 192 (H) 65 - 99 mg/dL   Comment 1 Notify RN     Current Facility-Administered Medications  Medication Dose Route Frequency Provider Last Rate Last Dose  . busPIRone (BUSPAR) tablet 15 mg  15 mg Oral BID Mckenzie Bove, Madie Reno, MD   15 mg at 12/10/16 1042  . carvedilol (COREG) tablet 12.5 mg  12.5 mg Oral BID WC Malachi Suderman T, MD   12.5 mg at 12/10/16 1042  . DULoxetine (CYMBALTA) DR capsule 60 mg  60 mg Oral Daily Zakari Bathe, Madie Reno, MD   60 mg at 12/10/16 1042  . gabapentin (NEURONTIN) capsule 600 mg  600 mg Oral BID Jenny Lai, Madie Reno, MD   600 mg at 12/10/16 1042  . insulin aspart (novoLOG) injection 0-15 Units  0-15 Units Subcutaneous TID WC Stancil Deisher, Madie Reno, MD   5 Units at 12/10/16 [phone]  . levothyroxine (SYNTHROID, LEVOTHROID) tablet 112 mcg  112 mcg Oral QAC breakfast Loraine Freid, Madie Reno, MD   112 mcg at 12/10/16 0847  . montelukast (SINGULAIR) tablet 10 mg  10 mg Oral QHS Mykel Sponaugle, Madie Reno, MD   10 mg at 12/09/16 2210  . risperiDONE (RISPERDAL) tablet 1 mg  1 mg Oral QHS Norinne Jeane T, MD   1 mg at 12/09/16 2210  . spironolactone (ALDACTONE) tablet 25 mg  25 mg Oral Daily Bertie Mcconathy, Madie Reno, MD   25 mg at 12/10/16 1042   Current Outpatient Medications  Medication Sig Dispense Refill  . acetaminophen (TYLENOL) 500 MG tablet Take 2 tablets (1,000 mg total) by mouth every 6 (six) hours as needed for moderate pain. 30 tablet 0  . albuterol (ACCUNEB) 0.63 MG/3ML nebulizer solution Take 1 ampule by nebulization every 6 (six) hours as needed for wheezing or shortness of breath.     Marland Kitchen albuterol  (PROAIR HFA) 108 (90 BASE) MCG/ACT inhaler Inhale 2 puffs into the lungs every 4 (four) hours as needed for  wheezing or shortness of breath.     . Ascorbic Acid (VITAMIN C PO) Take 1 tablet by mouth 3 (three) times a week.     Marland Kitchen aspirin EC 81 MG tablet Take 81 mg by mouth daily.    Marland Kitchen atorvastatin (LIPITOR) 80 MG tablet Take 1 tablet (80 mg total) by mouth at bedtime. 90 tablet 1  . budesonide (PULMICORT) 0.5 MG/2ML nebulizer solution Take 0.5 mg by nebulization 2 (two) times daily.     . bumetanide (BUMEX) 0.5 MG tablet Take 0.5 mg by mouth as needed.     . busPIRone (BUSPAR) 15 MG tablet TAKE ONE TABLET BY MOUTH TWICE DAILY 180 tablet 3  . carvedilol (COREG) 12.5 MG tablet Take 12.5 mg by mouth 2 (two) times daily with a meal.     . DULoxetine (CYMBALTA) 60 MG capsule Take 1 capsule (60 mg total) by mouth daily. 30 capsule 3  . fluticasone (FLONASE) 50 MCG/ACT nasal spray Place 2 sprays into both nostrils as needed for allergies.     . formoterol (PERFOROMIST) 20 MCG/2ML nebulizer solution Inhale 20 mcg into the lungs 2 (two) times daily.     Marland Kitchen gabapentin (NEURONTIN) 600 MG tablet Take 1 tablet (600 mg total) 2 (two) times daily by mouth.    . Hypromellose (ARTIFICIAL TEARS OP) Place 1 drop into both eyes as needed (dry eyes).     . insulin NPH Human (HUMULIN N,NOVOLIN N) 100 UNIT/ML injection Inject 24 Units into the skin at bedtime.     . insulin regular (NOVOLIN R,HUMULIN R) 100 units/mL injection Inject 28 Units 3 (three) times daily before meals into the skin. Based on sliding scale     . levothyroxine (SYNTHROID, LEVOTHROID) 100 MCG tablet Take 1 tablet (100 mcg total) by mouth every other day. (alternate with the 112 mcg strength) (Patient taking differently: Take 100 mcg by mouth See admin instructions. Take 1 tablet (100 mcg) by mouth before breakfast Monday thru Thursday (take 112 mcg other days)) 15 tablet 2  . levothyroxine (SYNTHROID, LEVOTHROID) 112 MCG tablet Take 1 tablet (112 mcg  total) by mouth every other day. (alternate with the 100 mcg strength) (Patient taking differently: Take 112 mcg by mouth See admin instructions. Take 1 tablet (112 mcg) by mouth before breakfast on Friday, Saturday, Sunday (take 100 mcg other days)) 15 tablet 2  . meclizine (ANTIVERT) 25 MG tablet Take 0.5-1 tablets (12.5-25 mg total) by mouth 3 (three) times daily as needed for dizziness. 30 tablet 0  . montelukast (SINGULAIR) 10 MG tablet Take 1 tablet (10 mg total) by mouth daily. 30 tablet 11  . nitroGLYCERIN (NITROSTAT) 0.4 MG SL tablet Place 1 tablet (0.4 mg total) every 5 (five) minutes as needed under the tongue for chest pain. Max of 3 pills total, call 911 25 tablet 5  . Omega-3 Fatty Acids (FISH OIL) 1200 MG CAPS Take 1 capsule by mouth 2 (two) times a week.     Marland Kitchen oxyCODONE-acetaminophen (ROXICET) 5-325 MG tablet Take 0.5-1 tablets every 6 (six) hours as needed by mouth for moderate pain or severe pain. 20 tablet 0  . OXYGEN Inhale 2 L into the lungs continuous.    . pantoprazole (PROTONIX) 20 MG tablet TAKE ONE TABLET BY MOUTH TWICE DAILY 180 tablet 1  . ranitidine (ZANTAC) 300 MG tablet Take 1 tablet (300 mg total) by mouth at bedtime. 30 tablet 11  . rOPINIRole (REQUIP) 1 MG tablet TAKE 1 TABLET BY MOUTH THREE TIMES DAILY  270 tablet 3  . spironolactone (ALDACTONE) 25 MG tablet Take 25 mg by mouth daily.    Marland Kitchen tiZANidine (ZANAFLEX) 2 MG tablet TAKE 1 TABLET BY MOUTH AT BEDTIME 30 tablet 2    Musculoskeletal: Strength & Muscle Tone: within normal limits Gait & Station: unsteady Patient leans: N/A  Psychiatric Specialty Exam: Physical Exam  Nursing note and vitals reviewed. Constitutional: She appears well-developed and well-nourished.  HENT:  Head: Normocephalic and atraumatic.  Eyes: Conjunctivae are normal. Pupils are equal, round, and reactive to light.  Neck: Normal range of motion.  Cardiovascular: Regular rhythm and normal heart sounds.  Respiratory: She is in  respiratory distress.  GI: Soft.  Musculoskeletal: Normal range of motion.  Neurological: She is alert.  Skin: Skin is warm and dry.  Psychiatric: Her affect is blunt. Her speech is delayed. She is slowed. Cognition and memory are impaired. She expresses impulsivity. She exhibits a depressed mood. She expresses suicidal ideation.    Review of Systems  HENT: Negative.   Eyes: Negative.   Respiratory: Positive for shortness of breath.   Cardiovascular: Negative.   Gastrointestinal: Negative.   Musculoskeletal: Negative.   Skin: Negative.   Neurological: Positive for weakness.  Psychiatric/Behavioral: Positive for depression, hallucinations, memory loss and suicidal ideas. Negative for substance abuse. The patient is nervous/anxious and has insomnia.     Blood pressure 124/60, pulse 77, temperature 97.9 F (36.6 C), temperature source Oral, resp. rate 11, height _0  (1.448 m), weight 93 kg (205 lb), SpO2 100 %.Body mass index is 44.36 kg/m.  General Appearance: Casual  Eye Contact:  Fair  Speech:  Slow  Volume:  Decreased  Mood:  Dysphoric  Affect:  Constricted  Thought Process:  Coherent  Orientation:  Full (Time, Place, and Person)  Thought Content:  Hallucinations: Visual  Suicidal Thoughts:  Yes.  with intent/plan  Homicidal Thoughts:  No  Memory:  Immediate;   Fair Recent;   Fair Remote;   Fair  Judgement:  Impaired  Insight:  Fair  Psychomotor Activity:  Decreased  Concentration:  Concentration: Fair  Recall:  [employer] of Knowledge:  Fair  Language:  Fair  Akathisia:  No  Handed:  Right  AIMS (if indicated):     Assets:  Desire for Improvement Resilience Social Support  ADL's:  Intact  Cognition:  Impaired,  Mild  Sleep:        Treatment Plan Summary: Plan 76 year old woman with severe major depression recurrent and with psychotic features.  Case reviewed with TTS.  Patient has been tentatively accepted by a hospital.  I am told that we are awaiting a  patient to be discharged from that facility to make room for this patient.  No change to current treatment plan.  Continue current medicine.  Patient is under IVC and can be transported as soon as bed availability is confirmed.  Disposition: Recommend psychiatric Inpatient admission when medically cleared. Supportive therapy provided about ongoing stressors.  Alethia Berthold, MD 12/10/2016 4:45 PM

## 2016-12-10 NOTE — ED Notes (Signed)
IVC/Pending Placement 

## 2016-12-10 NOTE — ED Notes (Signed)
Pt does not want to wake up and eat. Pt is very drowsy and keeps falling asleep when this tech tries to wake her up to eat. Vitals taken and entered.

## 2016-12-10 NOTE — BH Assessment (Signed)
[facility] in [place] called to say that they will accept her they are just waiting on transportation to come pick up prior discharged pt to have an empty bed.

## 2016-12-10 NOTE — ED Notes (Signed)
Pt ambulated without assistance to the bathroom. Pt was given a pad and pt has returned to bed. Bed rail in lowest position so pt can get up and down as needed per pt request.

## 2016-12-10 NOTE — BH Assessment (Signed)
Referral for Geriatric placement submitted to the following:   [facility] (p-[phone]/f-[phone])   [facility] (p-[phone]/[phone])   [facility] (p-[phone]/f-[phone])   [facility] (p-[phone]/[phone])   [facility] ([phone])   [facility] ([phone]/[phone])   [facility] (p-[phone]/f-[phone]) [facility] [phone] 

## 2016-12-10 NOTE — ED Notes (Signed)
meds taken with water without complication. Pt denies needs at this time. Remains in NAD with 2L  in place.

## 2016-12-10 NOTE — Patient Outreach (Signed)
[place] [facility]) Care Management  12/10/2016  Bonnie Henry 08/15/1940 [id number]  Assessment- CSW received hospital admission update on patient yesterday. Patient went to PCP appointment and was advised to go to the ED. Patient admitted for suicidal ideations and COPD exacerbation. Referral for Geriatric placement submitted to several facilities today.   CSW received incoming call from patient's daughter. She provided CSW with update that patient was admitted yesterday. Daughter reports that she went to DSS and successfully applied for LTC Medicaid for patient. However, patient's daughter has to get several documents before the process is completed. Required documents needed before 01/21/17: Life insurance document with exact amount, bank statement for last month and suggestion to contact insurance to see if they would be willing to cover nursing home placement. Patient and daughter are interested in [facility] and [facility] and daughter is agreeable to contact facility either by phone or in person to gather further information as family is unsure which facility they want LTC placement. Patient's daughter CSW sent text message to daughter stating that Geriatric placement was submitted today. If Geriatric placement is not successful, patient's daughter has FL2 and is aware that LTC placement can still be pursued while CSW is out of the office. Patient and family are aware that CSW will be out of the office until 12/15/16 starting tomorrow.   Plan-CSW will continue to follow case closely, wait for hospital discharge and provide social work assistance.  [name], BSW, MSW, [email or web address] Phone: [phone] Fax: [phone]

## 2016-12-10 NOTE — ED Notes (Addendum)
Lunch tray placed at pt side. Pt sleeping. Daughter visiting with pt.

## 2016-12-11 DIAGNOSIS — I10 Essential (primary) hypertension: Secondary | ICD-10-CM | POA: Diagnosis not present

## 2016-12-11 DIAGNOSIS — R45851 Suicidal ideations: Secondary | ICD-10-CM | POA: Diagnosis not present

## 2016-12-11 DIAGNOSIS — J441 Chronic obstructive pulmonary disease with (acute) exacerbation: Secondary | ICD-10-CM | POA: Diagnosis not present

## 2016-12-11 DIAGNOSIS — R1310 Dysphagia, unspecified: Secondary | ICD-10-CM | POA: Diagnosis not present

## 2016-12-11 DIAGNOSIS — E039 Hypothyroidism, unspecified: Secondary | ICD-10-CM | POA: Diagnosis not present

## 2016-12-11 DIAGNOSIS — F329 Major depressive disorder, single episode, unspecified: Secondary | ICD-10-CM | POA: Diagnosis not present

## 2016-12-11 DIAGNOSIS — I2510 Atherosclerotic heart disease of native coronary artery without angina pectoris: Secondary | ICD-10-CM | POA: Diagnosis not present

## 2016-12-11 DIAGNOSIS — R451 Restlessness and agitation: Secondary | ICD-10-CM | POA: Diagnosis not present

## 2016-12-11 DIAGNOSIS — Z9981 Dependence on supplemental oxygen: Secondary | ICD-10-CM | POA: Diagnosis not present

## 2016-12-11 DIAGNOSIS — G4700 Insomnia, unspecified: Secondary | ICD-10-CM | POA: Diagnosis not present

## 2016-12-11 DIAGNOSIS — E669 Obesity, unspecified: Secondary | ICD-10-CM | POA: Diagnosis present

## 2016-12-11 DIAGNOSIS — M79641 Pain in right hand: Secondary | ICD-10-CM | POA: Diagnosis not present

## 2016-12-11 DIAGNOSIS — J449 Chronic obstructive pulmonary disease, unspecified: Secondary | ICD-10-CM | POA: Diagnosis present

## 2016-12-11 DIAGNOSIS — F419 Anxiety disorder, unspecified: Secondary | ICD-10-CM | POA: Diagnosis not present

## 2016-12-11 DIAGNOSIS — R4182 Altered mental status, unspecified: Secondary | ICD-10-CM | POA: Diagnosis not present

## 2016-12-11 DIAGNOSIS — J45909 Unspecified asthma, uncomplicated: Secondary | ICD-10-CM | POA: Diagnosis not present

## 2016-12-11 DIAGNOSIS — R600 Localized edema: Secondary | ICD-10-CM | POA: Diagnosis not present

## 2016-12-11 DIAGNOSIS — E1140 Type 2 diabetes mellitus with diabetic neuropathy, unspecified: Secondary | ICD-10-CM | POA: Diagnosis not present

## 2016-12-11 DIAGNOSIS — K219 Gastro-esophageal reflux disease without esophagitis: Secondary | ICD-10-CM | POA: Diagnosis not present

## 2016-12-11 DIAGNOSIS — E119 Type 2 diabetes mellitus without complications: Secondary | ICD-10-CM | POA: Diagnosis not present

## 2016-12-11 DIAGNOSIS — Z794 Long term (current) use of insulin: Secondary | ICD-10-CM | POA: Diagnosis not present

## 2016-12-11 DIAGNOSIS — Z91018 Allergy to other foods: Secondary | ICD-10-CM | POA: Diagnosis not present

## 2016-12-11 DIAGNOSIS — R21 Rash and other nonspecific skin eruption: Secondary | ICD-10-CM | POA: Diagnosis not present

## 2016-12-11 DIAGNOSIS — G319 Degenerative disease of nervous system, unspecified: Secondary | ICD-10-CM | POA: Diagnosis not present

## 2016-12-11 DIAGNOSIS — Z7951 Long term (current) use of inhaled steroids: Secondary | ICD-10-CM | POA: Diagnosis not present

## 2016-12-11 DIAGNOSIS — Z7982 Long term (current) use of aspirin: Secondary | ICD-10-CM | POA: Diagnosis not present

## 2016-12-11 DIAGNOSIS — Z6841 Body Mass Index (BMI) 40.0 and over, adult: Secondary | ICD-10-CM | POA: Diagnosis not present

## 2016-12-11 DIAGNOSIS — F333 Major depressive disorder, recurrent, severe with psychotic symptoms: Secondary | ICD-10-CM | POA: Insufficient documentation

## 2016-12-11 DIAGNOSIS — Z885 Allergy status to narcotic agent status: Secondary | ICD-10-CM | POA: Diagnosis not present

## 2016-12-11 DIAGNOSIS — Z87891 Personal history of nicotine dependence: Secondary | ICD-10-CM | POA: Diagnosis not present

## 2016-12-11 DIAGNOSIS — R51 Headache: Secondary | ICD-10-CM | POA: Diagnosis not present

## 2016-12-11 DIAGNOSIS — R9401 Abnormal electroencephalogram [EEG]: Secondary | ICD-10-CM | POA: Diagnosis not present

## 2016-12-11 DIAGNOSIS — Z79899 Other long term (current) drug therapy: Secondary | ICD-10-CM | POA: Diagnosis not present

## 2016-12-11 DIAGNOSIS — Z888 Allergy status to other drugs, medicaments and biological substances status: Secondary | ICD-10-CM | POA: Diagnosis not present

## 2016-12-11 LAB — GLUCOSE, CAPILLARY
GLUCOSE-CAPILLARY: 297 mg/dL — AB (ref 65–99)
Glucose-Capillary: 376 mg/dL — ABNORMAL HIGH (ref 65–99)

## 2016-12-11 NOTE — ED Notes (Signed)
IVC/  PENDING  PLACEMENT 

## 2016-12-11 NOTE — BH Assessment (Signed)
Left message with [place] [phone]) regarding status of patient transfer.

## 2016-12-11 NOTE — ED Provider Notes (Signed)
-----------------------------------------   6:17 AM on 12/11/2016 -----------------------------------------   Blood pressure (!) 145/60, pulse 78, temperature 97.9 F (36.6 C), temperature source Oral, resp. rate 18, height 4\' 9"  (1.448 m), weight 93 kg (205 lb), SpO2 100 %.  The patient had no acute events since last update.  Being at this time.  Disposition is pending Psychiatry/Behavioral Medicine team recommendations.     Paulette Blanch, MD 12/11/16 [phone]

## 2016-12-11 NOTE — ED Notes (Addendum)
Called and left message at [facility] to see status of bed. Sherri Rad called me back and said pt had a bed ready. See below  [place] [address], bed 1  Dr. Caren Hazy accepting  Call report [phone]  Will not accept pt between hours 2300-0600  Dr. Archie Balboa, ED sec., pts daughter notified.

## 2016-12-11 NOTE — Progress Notes (Signed)
Inpatient Diabetes Program Recommendations  AACE/ADA: New Consensus Statement on Inpatient Glycemic Control (2015)  Target Ranges:  Prepandial:   less than 140 mg/dL      Peak postprandial:   less than 180 mg/dL (1-2 hours)      Critically ill patients:  140 - 180 mg/dL   Lab Results  Component Value Date   GLUCAP 297 (H) 12/11/2016   HGBA1C 8.2 (H) 12/09/2016    Review of Glycemic Control  Results for Bonnie Henry, Bonnie Henry (MRN [id number]) as of 12/11/2016 10:21  Ref. Range 12/10/2016 07:59 12/10/2016 12:12 12/10/2016 17:14 12/10/2016 22:14 12/11/2016 08:03  Glucose-Capillary Latest Ref Range: 65 - 99 mg/dL 225 (H) 192 (H) 237 (H) 333 (H) 297 (H)    Diabetes history: Type 2 Outpatient Diabetes medications: NPH 24 units at hs, Novolin R 28 units tid   Current orders for Inpatient glycemic control: Novolog -15 units tid  Inpatient Diabetes Program Recommendations:  Fasting blood sugar 297mg /dl- please consider adding Lantus 12 units daily starting now.   Consider adding Novolog 5 units tid with meals (hold if patient eats less than 50%)   Gentry Fitz, RN, [place], [place], CDE Diabetes Coordinator Inpatient Diabetes Program  [phone] (Team Pager) [phone] ([place]) 12/11/2016 10:27 AM

## 2016-12-12 ENCOUNTER — Telehealth: Payer: Self-pay | Admitting: Family Medicine

## 2016-12-12 NOTE — Telephone Encounter (Signed)
I returned her call Daughter says she is at [facility] now Patient's daughter can visit if desired Use the social worker and be involved in discharge planning

## 2016-12-12 NOTE — Telephone Encounter (Signed)
Copied from [place] [phone]. Topic: Quick Communication - See Telephone Encounter >> Dec 12, 2016 11:54 AM Ether Griffins B wrote: CRM for notification. See Telephone encounter for:  Pt was moved from hospital to [facility] and daughter is having difficulty getting in touch with any one at the location to find out whats going on with her mother and would like Dr. Sanda Klein to call her please at [phone] 12/12/16.

## 2016-12-16 ENCOUNTER — Other Ambulatory Visit: Payer: Self-pay | Admitting: Licensed Clinical Social Worker

## 2016-12-16 NOTE — Patient Outreach (Signed)
[place] [facility]) Care Management  12/16/2016  Bonnie Henry [birth year] [id number]  Assessment- CSW completed outreach call to patient's residence and daughter answered. She reports that patient was discharged from hospital to [facility] for acute stay on 12/11/16. Daughter reports patient's cognitive state seems to have declined since being at [facility] and that patient has shown some aggressive behavior towards staff there as well. Daughter reports that a cognitive screening was completed and patient scored low. LTC placement is currently on hold due to patient's acute placement. CSW questioned if daughter had communicated with [facility] that family is interested in LTC placement and daughter declined. CSW completed call to [facility] and was transferred to acute site social worker Otila Kluver at number [phone]. CSW introduced self and reason for call. Otila Kluver reports that there has been no discussion or recommendation for LTC placement and that usually it is the family's responsibility to pursue this and not them. However, staff would be happy to complete a new FL2 and fax to desired facilities for patient if desired. CSW completed return call back to daughter and provided update. CSW encouraged daughter to coordinate with social worker at [facility]. [place] informed daughter that patient's current behavioral concerns may cause as a barrier for placement in terms of some facilities. CSW encouraged daughter to use this time home to research the facility list that was provided to her by CSW and contact facilities, ask to speak to admissions and gain additional information in terms of whether patient would be a good fit for facility or not. Daughter expressed understanding. No discharge updates from [facility] but they informed daughter that they would give a 24 hour notice before discharge is scheduled.  Plan-CSW will follow up  by 12/18/16 and continue to provide social work assistance and support.  [name], BSW, MSW, [email or web address] Phone: [phone] Fax: [phone]

## 2016-12-18 ENCOUNTER — Other Ambulatory Visit: Payer: Self-pay | Admitting: Licensed Clinical Social Worker

## 2016-12-18 NOTE — Patient Outreach (Signed)
  [facility] [facility]) Care Management  12/18/2016  Bonnie Henry 11/24/1940 [id number]  Assessment- CSW received incoming call from patient's daughter on 12/18/16. Patient reports that she visited [place], [place] and [facility] yesterday. However, she became aware that patient will now need a locked down memory unit to be placed at and none of these facilities can accommodate for this. CSW made several calls to different facilities with daughter on the phone and we were able to narrow down LTC placement options to the follow facilities:  [facility], [place], [place] [address] Phone- [phone]  [place], [place], [place] [address] [phone] Fax- [place] [address] [place], [place] [address] Phone- [phone] [phone] (Attention [place])  [facility] [address], [place], [place] [address] Phone- [phone] Fax- [place], [place], [place] [address] Phone- [phone] [phone] (Attention [place])  CSW sent text message to patient's daughter with this information for her to have one hand. CSW spoke to [place] with [employer] and she requested that social worker at [facility] to send her clinicals. Patient's daughter is willing to assist with this. Patient's daughter plans to contact [place] and ask that she fax a new FL2 and clinicals to the facilities listed above. Daughter is agreeable to contact this CSW back if she has any issues with making this request. CSW encouraged patient's daughter to complete tours at these facilities if she has time this week. Patient agreeable and appreciative of assistance today. CSW was able to complete entire assessment over the phone as well.  Plan-CSW will follow up within one week and will continue to  assist with LTC placement.  [name], BSW, MSW, [name] Phone: [phone] Fax: [phone]

## 2016-12-19 ENCOUNTER — Other Ambulatory Visit: Payer: Self-pay | Admitting: Licensed Clinical Social Worker

## 2016-12-19 NOTE — Patient Outreach (Signed)
Bonnie Henry) Care Management  12/19/2016  Bonnie Henry 02-05-1940 [id number]  Assessment- CSW received incoming call from patient's daughter. She reports that she talked to [place], [place] at [facility] and she has agreed to fax paperwork to 7 LTC facilities that have a memory care unit. A new FL2 will also be completed. No plans for discharge as of yet. Daughter agreeable to update CSW once a discharge has been scheduled.  Plan-CSW will continue to monitor case closely and will continue to provide social work assistance and support.  Bonnie Henry, BSW, MSW, [email or web address] Phone: [phone] Fax: [phone]

## 2016-12-22 ENCOUNTER — Other Ambulatory Visit: Payer: Self-pay | Admitting: Licensed Clinical Social Worker

## 2016-12-22 DIAGNOSIS — R609 Edema, unspecified: Secondary | ICD-10-CM | POA: Insufficient documentation

## 2016-12-22 NOTE — Patient Outreach (Signed)
[place] [facility]) Care Management  12/22/2016  Bonnie Henry Jul 15, 1940 [id number]  Assessment- CSW completed outreach call to patient's residence and daughter answered. She reports she has not talked with [facility] since last night and thought that patient would be discharging from facility today. CSW encouraged her to contact facility today to follow up on discharge plans. CSW contacted [place] and left a message with admissions to check and make sure that paperwork was successfully faxed to them from [facility].  Plan-CSW will await for return call from from [facility] and [place].   [name], BSW, MSW, [name] Phone: [phone] Fax: [phone]

## 2016-12-23 ENCOUNTER — Other Ambulatory Visit: Payer: Self-pay | Admitting: Licensed Clinical Social Worker

## 2016-12-23 NOTE — Patient Outreach (Signed)
[place] [facility]) Care Management  12/23/2016  Bonnie Henry 1940-11-22 [id number]  Assessment- CSW received return phone call from [name] with [name] and was informed that she has tried several times to fax document to [name] but each fax has been unsuccessful. She reports that she is unable to spend a lot of time on this patient's case because patient discharged today but if CSW could get another fax number then she can attempt to fax it there. Bonnie Henry is unable to email document because document is too big. CSW coordinated with [place] Management Assistant and was able to retrieve another working fax number for [name]. CSW completed call back to [name] but was unable to reach her. CSW left her a voice message with updated fax number and requested her to attempt to fax this document one last time.   Plan-CSW will await for fax with FL2 and then fax to facilities.  Bonnie Henry, BSW, MSW, [email or web address] Phone: [phone] Fax: [phone]

## 2016-12-23 NOTE — Patient Outreach (Signed)
[place] [facility]) Care Management  12/23/2016  Bonnie Henry [birth year] [id number]  Assessment- CSW received text message from daughter that she successfully picked up patient from [facility] but as not able to gain copy of FL2. CSW contacted several of the selected facilities to see if they received FL2 fax from [facility] but all facilities report not receiving this fax. CSW completed another call to Otila Kluver, SW at [facility]. [place] spoke with another SW as Otila Kluver is currently out of the office this week. She reports that according to Tina's notes that the [facility] fax was successfully sent to the selected facilities and that she even documented a confirmed fax return. SW is agreeable to fax FL2 to [place] Management office so that [place] can fax FL2 herself to the selected facilities to assist with the LTC placement process now that patient will be returning back home today. CSW sent update to daughter through text message (her preferred way of communication.)  Plan-CSW will await for fax from [place], [place], MSW, [email or web address] Phone: [phone] Fax: [phone]

## 2016-12-23 NOTE — Patient Outreach (Signed)
[place] [facility]) Care Management  12/23/2016  WALTERINE AMODEI 12-20-1940 [id number]  Assessment- CSW received return voice message from admissions with [facility] and they state that they did not receive a fax from [facility] on patient. CSW completed call to [place], SW at [facility] to check on this but was unable to reach her. CSW left a voice message encouraging a return call back once she is able.   CSW received text message from patient's daughter [name]. She reports that patient will be discharging from facility today and she is on her way to pick up patient. CSW asked her to get copy of FL2 and to discuss with staff that [facility] did not receive fax from them.  Plan-CSW will await for return call from SW at [place], [place], MSW, [email or web address] Phone: [phone] Fax: [phone]

## 2016-12-24 DIAGNOSIS — R413 Other amnesia: Secondary | ICD-10-CM | POA: Diagnosis not present

## 2016-12-25 ENCOUNTER — Other Ambulatory Visit: Payer: Self-pay | Admitting: Licensed Clinical Social Worker

## 2016-12-25 NOTE — Patient Outreach (Signed)
[place] [facility]) Care Management  12/25/2016  Bonnie Henry 01-02-1941 [id number]  Assessment-CSW completed outreach attempt today to follow up on LTC placement. Someone answered the phone and stated that patient nor daughter is available at this time. CSW was unable to gain access to completed FL2 from [facility]. Therefore, another [facility] will need to be completed in order to pursue placement.   Plan-CSW will make another outreach attempt within one week and continue to assist with LTC placement.  [name], BSW, MSW, [email or web address] Phone: [phone] Fax: [phone]

## 2016-12-26 ENCOUNTER — Other Ambulatory Visit: Payer: Self-pay | Admitting: Licensed Clinical Social Worker

## 2016-12-26 NOTE — Patient Outreach (Signed)
[place] [facility]) Care Management  12/26/2016  ABIGAILE ROSSIE [birth year] [id number]  Assessment- CSW received incoming call from patient's daughter. She reports that she had back surgery yesterday but is doing very well. She is unable to drive at this time and would prefer to start the LTC placement again at the end of next week due to the holiday, patient's return back home from hospital and daughter's back surgery. CSW agreeable. Family is aware that a new FL2 will need to be completed as the last one by PCP has been expired. Family has visited [place] and feel that this will be the right facility for patient. CSW will follow up with family after the holidays to resume the LTC placement process.  Plan-CSW will follow up within one week and continue to provide social work assistance and support.  [name], BSW, MSW, [email or web address] Phone: [phone] Fax: [phone]

## 2016-12-29 ENCOUNTER — Telehealth: Payer: Self-pay | Admitting: Family Medicine

## 2016-12-29 NOTE — Telephone Encounter (Signed)
Please ask her to call her psychiatrist I cannot do this to get around insurance

## 2016-12-29 NOTE — Telephone Encounter (Signed)
Called pt's daughter, who states that her mother's insurance will not pay for her rx of Abilify due to the sig stating "take 2 tablets a day by mouth" the daughter is requesting that Dr.Lada  Write a new rx stating "take 1 tablet by mouth daily" advised daughter that firstly this is insurance fraud, her mother's rx was written for that doseage for a reason. Dr.Lada is not a psychiatrist and cannot change the pt's medication as she was not the ordering physician. The patient was just discharged from [facility] under psychiatric care. The daughter states that they did have a follow up scheduled with a physciatric walk in clinic but they have not been able to go yet due to the daughter herself having surgery. Advised daughter on other means of transportation (a neighbor, relative or taxi) The patient does have medication and can get new rx filled just may have to pay out of pocket. Daughter is very upset by this and hangs up the line.

## 2016-12-29 NOTE — Telephone Encounter (Signed)
Copied from [place]. Topic: Quick Communication - See Telephone Encounter >> Dec 29, 2016  8:19 AM Bonnie Henry wrote: CRM for notification. See Telephone encounter for:  Pts daughter wants to know if Dr Sanda Klein will write the pt a rx for abilify for 1 a day,  she recently prescribed the med in the hospital  but her insurance will not pay for 2 a day   Please call  daughter Bonnie Henry at [place]   12/29/16.

## 2016-12-31 ENCOUNTER — Inpatient Hospital Stay: Payer: Self-pay | Admitting: Family Medicine

## 2017-01-02 ENCOUNTER — Other Ambulatory Visit: Payer: Self-pay | Admitting: Licensed Clinical Social Worker

## 2017-01-02 NOTE — Patient Outreach (Signed)
[place]) Care Management  01/02/2017  BRITTAINY BUCKER 21-Aug-1940 [id number]  Assessment-CSW completed outreach attempt today to follow up on LTC placement. CSW unable to reach family successfully. CSW left a HIPPA compliant voice message encouraging patient to return call once available.  Plan-CSW will await return call or complete an additional outreach within one week.  [name], BSW, MSW, [email or web address] Phone: [phone] Fax: [phone]

## 2017-01-05 ENCOUNTER — Other Ambulatory Visit: Payer: Self-pay | Admitting: Licensed Clinical Social Worker

## 2017-01-05 NOTE — Patient Outreach (Signed)
[place] [facility]) Care Management  01/05/2017  Bonnie Henry 1940-05-10 [id number]  Assessment-CSW completed outreach attempt today. CSW unable to reach patient or family successfully. CSW left a HIPPA compliant voice message encouraging patient or family to return call once available.  Plan-CSW will await return call or complete another outreach within one week.  [name], BSW, MSW, [email or web address] Phone: [phone] Fax: [phone]

## 2017-01-06 ENCOUNTER — Other Ambulatory Visit: Payer: Self-pay | Admitting: Family Medicine

## 2017-01-06 ENCOUNTER — Other Ambulatory Visit: Payer: Self-pay | Admitting: Neurology

## 2017-01-12 ENCOUNTER — Other Ambulatory Visit: Payer: Self-pay | Admitting: Licensed Clinical Social Worker

## 2017-01-12 NOTE — Patient Outreach (Signed)
[place] [facility]) Care Management  01/12/2017  LANIJAH WARZECHA September 13, 1940 [id number]  Assessment-CSW completed additional outreach attempt today. CSW unable to reach patient or family successfully. CSW left a HIPPA compliant voice message encouraging a return call if family is still interested in pursuing LTC placement for patient. CSW will complete fourth and final outreach within one week.   Plan-CSW will await return call or complete final outreach attempt within one week.  [name], BSW, MSW, [email or web address] Phone: [phone] Fax: [phone]

## 2017-01-13 ENCOUNTER — Encounter: Payer: Self-pay | Admitting: Neurology

## 2017-01-13 ENCOUNTER — Ambulatory Visit (INDEPENDENT_AMBULATORY_CARE_PROVIDER_SITE_OTHER): Payer: Medicare Other | Admitting: Neurology

## 2017-01-13 VITALS — BP 143/65 | HR 71 | Wt 207.0 lb

## 2017-01-13 DIAGNOSIS — E0842 Diabetes mellitus due to underlying condition with diabetic polyneuropathy: Secondary | ICD-10-CM

## 2017-01-13 DIAGNOSIS — F32A Depression, unspecified: Secondary | ICD-10-CM

## 2017-01-13 DIAGNOSIS — R4189 Other symptoms and signs involving cognitive functions and awareness: Secondary | ICD-10-CM | POA: Diagnosis not present

## 2017-01-13 DIAGNOSIS — F329 Major depressive disorder, single episode, unspecified: Secondary | ICD-10-CM

## 2017-01-13 NOTE — Patient Instructions (Signed)
-   continue ASA and lipitor for stroke prevention - continue follow up with pulmonary for BiPAP, give some time to work - check BP and glucose at home and record - Follow up with your primary care physician for stroke risk factor modification. Recommend maintain blood pressure goal <130/80, diabetes with hemoglobin A1c goal below 7.0% and lipids with LDL cholesterol goal below 70 mg/dL.  - continue follow up with psychiatrist, go for walk in clinic ASAP  - follow up with Dr. Beverly Gust to finish neuropsych testing to guide therapy  - let us know the plan after talking with psychiatry and psychology and will decide on further appointment.

## 2017-01-13 NOTE — Progress Notes (Signed)
NEUROLOGY CLINIC FOLLOW UP NOTE  NAME: Bonnie Henry DOB: 12-Sep-1940  HPI: Bonnie Henry is a 77 y.o. female with PMH of diabetes, COPD on oxygen, left breast cancer s/p mastectomy, severe RICA stenosis s/p CEA, hyperlipidemia, morbid obesity, memory loss and depression  who presents as a new patient for Frequent falls, intermittent disorientation and speech difficulty. patient is continent by her daughter.  Patient stated that since this year, she had about 15 falls. Sometimes there are mechanical falls, tripping on things, or turning too fast; sometimes when she is bending over to pick up things on the floor and she fell forward; sometimes she stands up fast with lightheadedness or just started walking and she is down; and sometimes she had right knee pain and her knees give out. She denies any leg weakness, loss of consciousness, or hitting her head. She is in wheelchair today with her daughter in office.  Patient and daughter reported intermittent disorientation about the place. She goes to bathroom, and comes out does not knowing where she was; sometimes goes to the wrong direction; asking to go home although he is right at home. Also has intermittent garbled speech, speech does not make sense or intelligible words. This episodes are more frequently having in the evenings, or she is tired, or more prominent at times she had severe cold several months ago.  She has been following with Dr. Posey Pronto at [facility] for memory loss. MRI showed moderate WM changes and atrophy. Has declined neuropsychologic testing in the past. She still on prednisone for COPD treatment and her glucose has been difficult for control.  She had a sleep study 05/2016 showed very mild OSA, currently not on CPAP, she stated that she can not have good sleep every day.  10/08/16 follow up - pt daughter stated that pt condition getting worse. She started to have hallucinations, seeing people not there, it could happen  during the day or at night.  patient stated that she hearg her son's voice and sometimes she saw her son in the room. Sometimes she cannot recognize whether it was real or not. Auditory hallucination more frequent than visual hallucination. Patient also had one panic attack / paranoid episode 2 weeks ago while she was in her daughter's car, daughter went to [employer] shopping briefly, she had a nap in the car and then woke up and could not find anybody in the car, then she became panic, drove around the car and later stopped by police at another parking lot. At home, she also fear of people leaving her alone, always checking her daughter to see whether she was with her. As per daughter, she forgets children's name, taking medications, cannot remember what she was doing. She also has a lot of pain, limiting her activities. She also had worsening headaches recently. She felt depressed which confirmed by daughter, and she was crying during this clinical visit. She was on Zoloft in the past for depression, weaned off due to ineffectiveness, currently on Cymbalta. She had appointment for neuropsychological testing in November this year. As per daughter, patient COPD is getting worse, and will need to follow up with [facility] pulmonology sooner. Daughter still thinks patient had OSA which made her confusion on waking up every day in the morning. Sugar not in good control at home, fluctuating a lot. She has appointment this afternoon with PCP.  Interval history: Pt went to see her PCP for chest congestion early Dec, had break down in doctor's office.  She  was sent to [facility] ED and was subsequently [facility] to behavioral [facility] for [facility], put on Depakote and Abilify.  Discharged on 12/23/16 with Depakote 1 tablet daily and Abilify 2 tablet/day.  However, after discharge her insurance not able to cover her Abilify for 2 tablets/day, so far patient was not on Abilify before seeing psychiatrist at walk-in clinic for  follow up. She saw neuropsychologist Dr. Beverly Gust second day after discharge, not able to complete neuropsychology battery, recommend further testing next visit.  During the interval time, patient still has frequent breakdown, emotional changes, confusion and disorientation as well as hallucinations at home.  Follow with pulmonology, started BiPAP last night for COPD treatment. BP today 143/64.   Past Medical History:  Diagnosis Date  . Anemia   . Anxiety   . Aortic atherosclerosis ([place]) 12/09/2016   Chest CT Dec 2018  . Arthritis    "joints ache all over" . osteoarthritis  . Asthma   . Atrial fibrillation ([place])   . Breast cancer, left breast (HCC)    S/P mastectomy  . CAD (coronary artery disease)   . CHF (congestive heart failure) ([place])   . Chronic back pain   . Constipation 03/04/2015  . COPD (chronic obstructive pulmonary disease) ([place])   . Depression, major   . Diabetes mellitus without complication ([place])   . DVT (deep venous thrombosis) ([place])   . GERD (gastroesophageal reflux disease)   . Headache   . Heart murmur   . Hepatitis    HEP "C"  . History of blood transfusion    "when I had my mastectomy"  . History of hiatal hernia   . History of right-sided carotid endarterectomy 06/10/2015  . Hypercholesteremia   . Hypertension   . Morbid obesity ([place])   . Myocardial infarction ([place])   . Neuropathy   . Peripheral vascular disease ([place])   . Post-surgical hypothyroidism   . Restless leg syndrome   . Shortness of breath dyspnea   . Sleep apnea    "2015 wore mask; lost weight; told didn't need mask anymore" (02/23/2014)  . Type II diabetes mellitus ([place])    Past Surgical History:  Procedure Laterality Date  . ARTERY BIOPSY Right 09/29/2014   Procedure: BIOPSY TEMPORAL ARTERY;  Surgeon: Angelia Mould, MD;  Location: [place] ;  Service: Vascular;  Laterality: Right;  . BARTHOLIN CYST MARSUPIALIZATION    . BREAST BIOPSY Left    Mastectomy  . CARDIAC CATHETERIZATION   2000's  . CAROTID ENDARTERECTOMY Left 2009  . CATARACT EXTRACTION W/ INTRAOCULAR LENS  IMPLANT, BILATERAL Bilateral ~ 2011  . CORONARY ANGIOPLASTY WITH STENT PLACEMENT  1990's X 2   "2; 1"  . ENDARTERECTOMY Right 05/16/2015   Procedure: ENDARTERECTOMY CAROTID;  Surgeon: Katha Cabal, MD;  Location: [facility];  Service: Vascular;  Laterality: Right;  . ESOPHAGOGASTRODUODENOSCOPY (EGD) WITH PROPOFOL N/A 12/12/2014   Procedure: ESOPHAGOGASTRODUODENOSCOPY (EGD) WITH PROPOFOL;  Surgeon: Lucilla Lame, MD;  Location: [facility] END[facility];  Service: Endoscopy;  Laterality: N/A;  . EYE SURGERY Bilateral    Catract Extraction with IOL  . HAMMER TOE SURGERY Left    2nd and 3rd digits  . JOINT REPLACEMENT Bilateral    Total Hip Replacement  . JOINT REPLACEMENT Left    Total Knee Replacement  . LEFT HEART CATHETERIZATION WITH CORONARY ANGIOGRAM N/A 02/24/2014   Procedure: LEFT HEART CATHETERIZATION WITH CORONARY ANGIOGRAM;  Surgeon: Leonie Man, MD;  Location: [facility];  Service: Cardiovascular;  Laterality: N/A;  .  MASTECTOMY Left 1990's  . SHOULDER SURGERY Right    "cleaned it out; no scope"  . THYROIDECTOMY, PARTIAL  1950's  . TOE SURGERY Right    "cut piece of bone out so toe didn't dig into my foot"  . TONSILLECTOMY  1950's  . TOTAL HIP ARTHROPLASTY Bilateral 1990's  . TOTAL KNEE ARTHROPLASTY Left 1990's  . TRIGGER FINGER RELEASE Left   . TUBAL LIGATION    . VAGINAL HYSTERECTOMY     "partial"   Family History  Problem Relation Age of Onset  . Arthritis Mother   . Cancer Mother   . Diabetes Mother   . Heart disease Mother   . Hyperlipidemia Mother   . Hypertension Mother   . Alcohol abuse Father   . Brain cancer Father   . Arthritis Maternal Grandmother   . Alzheimer's disease Maternal Grandmother   . Diabetes Daughter   . Hyperlipidemia Daughter   . Hypertension Daughter   . Thyroid cancer Daughter   . Diabetes Son   . Diabetes Daughter   . Hypertension Daughter   .  Diabetes Son   . Hypertension Son   . Hyperlipidemia Son   . Thyroid cancer Son   . Diabetes Brother   . Diabetes Brother    Current Outpatient Medications  Medication Sig Dispense Refill  . acetaminophen (TYLENOL) 500 MG tablet Take 2 tablets (1,000 mg total) by mouth every 6 (six) hours as needed for moderate pain. 30 tablet 0  . albuterol (ACCUNEB) 0.63 MG/3ML nebulizer solution Take 1 ampule by nebulization every 6 (six) hours as needed for wheezing or shortness of breath.     Marland Kitchen albuterol (PROAIR HFA) 108 (90 BASE) MCG/ACT inhaler Inhale 2 puffs into the lungs every 4 (four) hours as needed for wheezing or shortness of breath.     . Ascorbic Acid (VITAMIN C PO) Take 1 tablet by mouth 3 (three) times a week.     Marland Kitchen aspirin EC 81 MG tablet Take 81 mg by mouth daily.    Marland Kitchen atorvastatin (LIPITOR) 80 MG tablet Take 1 tablet (80 mg total) by mouth at bedtime. 90 tablet 1  . budesonide (PULMICORT) 0.5 MG/2ML nebulizer solution Take 0.5 mg by nebulization 2 (two) times daily.     . bumetanide (BUMEX) 0.5 MG tablet Take 0.5 mg by mouth as needed.     . busPIRone (BUSPAR) 15 MG tablet TAKE ONE TABLET BY MOUTH TWICE DAILY 180 tablet 3  . carvedilol (COREG) 12.5 MG tablet Take 12.5 mg by mouth 2 (two) times daily with a meal.     . divalproex (DEPAKOTE) 250 MG DR tablet Take 250 mg by mouth.     . DULoxetine (CYMBALTA) 60 MG capsule Take 1 capsule (60 mg total) by mouth daily. 30 capsule 3  . fluticasone (FLONASE) 50 MCG/ACT nasal spray Place 2 sprays into both nostrils as needed for allergies.     . formoterol (PERFOROMIST) 20 MCG/2ML nebulizer solution Inhale 20 mcg into the lungs 2 (two) times daily.     Marland Kitchen gabapentin (NEURONTIN) 600 MG tablet Take 1 tablet (600 mg total) 2 (two) times daily by mouth.    . Hypromellose (ARTIFICIAL TEARS OP) Place 1 drop into both eyes as needed (dry eyes).     . insulin NPH Human (HUMULIN N,NOVOLIN N) 100 UNIT/ML injection Inject 24 Units into the skin at bedtime.      . insulin regular (NOVOLIN R,HUMULIN R) 100 units/mL injection Inject 28 Units 3 (three) times  daily before meals into the skin. Based on sliding scale     . levothyroxine (SYNTHROID, LEVOTHROID) 100 MCG tablet Take 1 tablet (100 mcg total) by mouth every other day. (alternate with the 112 mcg strength) 15 tablet 2  . levothyroxine (SYNTHROID, LEVOTHROID) 112 MCG tablet Take 1 tablet (112 mcg total) by mouth every other day. (alternate with the 100 mcg strength) (Patient taking differently: Take 112 mcg by mouth See admin instructions. Take 1 tablet (112 mcg) by mouth before breakfast on Friday, Saturday, Sunday (take 100 mcg other days)) 15 tablet 2  . meclizine (ANTIVERT) 25 MG tablet Take 0.5-1 tablets (12.5-25 mg total) by mouth 3 (three) times daily as needed for dizziness. 30 tablet 0  . montelukast (SINGULAIR) 10 MG tablet TAKE ONE TABLET BY MOUTH ONCE DAILY 90 tablet 3  . nitroGLYCERIN (NITROSTAT) 0.4 MG SL tablet Place 1 tablet (0.4 mg total) every 5 (five) minutes as needed under the tongue for chest pain. Max of 3 pills total, call 911 25 tablet 5  . Omega-3 Fatty Acids (FISH OIL) 1200 MG CAPS Take 1 capsule by mouth 2 (two) times a week.     Marland Kitchen oxyCODONE-acetaminophen (ROXICET) 5-325 MG tablet Take 0.5-1 tablets every 6 (six) hours as needed by mouth for moderate pain or severe pain. 20 tablet 0  . OXYGEN Inhale 2 L into the lungs continuous.    . pantoprazole (PROTONIX) 20 MG tablet TAKE ONE TABLET BY MOUTH TWICE DAILY 180 tablet 1  . ranitidine (ZANTAC) 300 MG tablet Take 1 tablet (300 mg total) by mouth at bedtime. 30 tablet 11  . rOPINIRole (REQUIP) 1 MG tablet TAKE 1 TABLET BY MOUTH THREE TIMES DAILY 270 tablet 3  . spironolactone (ALDACTONE) 25 MG tablet Take 25 mg by mouth daily.    Marland Kitchen tiZANidine (ZANAFLEX) 2 MG tablet TAKE 1 TABLET BY MOUTH AT BEDTIME 30 tablet 2   No current facility-administered medications for this visit.    Allergies  Allergen Reactions  . Vasotec  [Enalapril] Swelling    Throat swells  . Codeine Nausea And Vomiting  . Morphine And Related Nausea And Vomiting   Social History   Socioeconomic History  . Marital status: Widowed    Spouse name: Not on file  . Number of children: Not on file  . Years of education: Not on file  . Highest education level: Not on file  Social Needs  . Financial resource strain: Not on file  . Food insecurity - worry: Not on file  . Food insecurity - inability: Not on file  . Transportation needs - medical: Not on file  . Transportation needs - non-medical: Not on file  Occupational History  . Not on file  Tobacco Use  . Smoking status: Former Smoker    Packs/day: 1.50    Years: 44.00    Pack years: 66.00    Types: Cigarettes    Last attempt to quit: 01/06/1994    Years since quitting: 23.0  . Smokeless tobacco: Never Used  . Tobacco comment: "quit smoking cigarettes in ~ 2000"  Substance and Sexual Activity  . Alcohol use: No    Alcohol/week: 0.0 oz    Comment: 02/23/2014 "no alcohol since 1990's"  . Drug use: No  . Sexual activity: No  Other Topics Concern  . Not on file  Social History Narrative   Lives with daughter in a mobile home.  Has 4 children alive, 1 deceased.  Retired from [employer].  Education: high school.  Review of Systems Full 14 system review of systems performed and notable only for those listed, all others are neg:  Constitutional:  fatigue Cardiovascular: leg swelling Ear/Nose/Throat:  Hearing loss, Ringing in ears Skin:  Eyes:  Eye itching, eye redness, blurry vision Respiratory:  SOB Gastroitestinal: consitipation Genitourinary: urine incontinence Hematology/Lymphatic:  Easy bruising Endocrine:  Musculoskeletal:  Joint pain, joint swelling, neck pain, neck stiffness, muscle cramp, back pain, aching muscles, walking difficulty Allergy/Immunology:  Runny nose, skin sensitivity Neurological:  Memory loss,  headache, weakness, tremors , dizziness Psychiatric:  agitation, confusion, depression, hallucinations, nervous, anxiety Sleep: Snoring, restless legs   Physical Exam  Vitals:   01/13/17 1203  BP: (!) 143/65  Pulse: 71    General - Morbid obesity, well developed, on home oxygen with mild respiratory distress.   Ophthalmologic - fundi not visualized due to eye movement.  Cardiovascular - Regular rate and rhythm with no murmur.   Neck - supple, no nuchal rigidity.  Mental Status -  Level of arousal and orientation to time, place, and person were intact. Language including expression, naming, repetition, comprehension, reading, and writing was assessed and found intact. Fund of Knowledge was assessed and was intact.  Cranial Nerves II - XII - II - Visual field intact OU. III, IV, VI - Extraocular movements intact. V - Facial sensation intact bilaterally. VII - Facial movement intact bilaterally. VIII - Hearing & vestibular intact bilaterally. X - Palate elevates symmetrically. XI - Chin turning & shoulder shrug intact bilaterally. XII - Tongue protrusion intact.  Motor Strength - The patient's strength was 5/5 BUEs, 4/5 BLEs and pronator drift was absent.  Bulk was normal and fasciculations were absent.   Motor Tone - Muscle tone was assessed at the neck and appendages and was normal.  Reflexes - The patient's reflexes were normal in all extremities and she had no pathological reflexes.  Sensory - Light touch, temperature/pinprick were assessed and were normal    Coordination - The patient had normal movements in the hands with no ataxia or dysmetria.  Tremor was absent.  Gait and Station - in wheelchair, not tested.   Imaging  I have personally reviewed the radiological images below and agree with the radiology interpretations.  MRI cervical spine wo contrast 02/20/2015: 1. Cervical spondylosis and degenerative disc disease, causing mild impingement at the C4-5, C5-6, and C6-7 levels, as detailed above. 2. Suspected  mild chronic ischemic microvascular white matter disease along the posterior pons, not changed from prior.  MRI brain wo contrast 04/25/2015: 1. No acute or focal abnormality to explain the patient's symptoms. 2. Moderate generalized atrophy and moderate to severe diffuse white matter disease. This likely reflects the sequela of chronic microvascular ischemia. 3. Stable exophthalmos.  Mri and MRA Head Wo Contrast 08/22/2016 IMPRESSION: Brain MRI: 1. No acute finding or change from 04/25/2015. 2. Extensive chronic small vessel ischemia in the cerebral white matter. 3. Atrophy and ventriculomegaly. Intracranial MRA: Negative.   TTE 02/2014 Left ventricle: The cavity size was normal. Wall thickness was increased in a pattern of mild LVH. The estimated ejection fraction was 60%. Regional wall motion abnormalities cannot be excluded. Doppler parameters are consistent with high ventricular filling pressure. - Aortic valve: Valve is poorly seen. There is some thickening. I suspect mild AS considering all data. Trace AI. - Left atrium: The atrium was mildly dilated. - Right ventricle: The cavity size was normal. Systolic function was normal.  05/2016 sleep study - very mild OSA, AHI only 6.1  Lab Review Component     Latest Ref Rng & Units 05/29/2016 07/06/2016 08/06/2016  Cholesterol     <200 mg/dL 183  181  Triglycerides     <150 mg/dL 153 (H)  148  HDL Cholesterol     >50 mg/dL 63  69  Total CHOL/HDL Ratio     <5.0 Ratio 2.9  2.6  VLDL     <30 mg/dL 31 (H)  30  LDL (calc)     <100 mg/dL 89  82  Hemoglobin A1C     4.8 - 5.6 %  7.6 (H)   Mean Plasma Glucose     mg/dL  171   TSH     mIU/L   2.14     Assessment   In summary, RUPINDER LIVINGSTON is a 77 y.o. female with PMH of diabetes, COPD on oxygen, left breast cancer s/p mastectomy, severe RICA stenosis s/p CEA, hyperlipidemia, morbid obesity, memory loss and depression  who followed in clinic for Frequent falls,  intermittent disorientation and speech difficulty. Her falls are related to mechanical falls or getting up too fast. Her lose of place orientation and dysarthria are related to tiredness/physical condition decline. MRI showed brain atrophy and WM changes. Her memory issue, paranoid behavior, panic attack, hallucinations are more consistent with depression, decreased brain reserve and MCI.   Admitted to behavioral health Hospital in 12/2016 for depression and suicidal thoughts, put on Depakote and Abilify.  However, not able to continue abilify due to insurance issue. She saw neuropsychologist Dr. Beverly Gust second day after discharge, not able to complete neuropsychology battery, recommend further testing next visit.  Follow with pulmonology, started BiPAP for COPD treatment.  Plan: - continue ASA and lipitor for stroke prevention - continue follow up with pulmonary for BiPAP treatment - check BP and glucose at home and record - Follow up with your primary care physician for stroke risk factor modification. Recommend maintain blood pressure goal <130/80, diabetes with hemoglobin A1c goal below 7.0% and lipids with LDL cholesterol goal below 70 mg/dL.  - continue follow up with psychiatrist, arrange for walk in clinic ASAP  - follow up with Dr. Beverly Gust to finish neuropsych testing to guide therapy  - further f/u TBD   I spent more than 25 minutes of face to face time with the patient. Greater than 50% of time was spent in counseling and coordination of care. We discussed treatment of depression, follow up with psychiatry and psychology, as well as pulmonary.   No orders of the defined types were placed in this encounter.   No orders of the defined types were placed in this encounter.   Patient Instructions  - continue ASA and lipitor for stroke prevention - continue follow up with pulmonary for BiPAP, give some time to work - check BP and glucose at home and record - Follow up with your primary  care physician for stroke risk factor modification. Recommend maintain blood pressure goal <130/80, diabetes with hemoglobin A1c goal below 7.0% and lipids with LDL cholesterol goal below 70 mg/dL.  - continue follow up with psychiatrist, go for walk in clinic ASAP  - follow up with Dr. Beverly Gust to finish neuropsych testing to guide therapy  - let us know the plan after talking with psychiatry and psychology and will decide on further appointment.    Rosalin Hawking, MD PhD [facility] [address] [place], [place] [address] [phone]

## 2017-01-14 ENCOUNTER — Other Ambulatory Visit: Payer: Self-pay | Admitting: Licensed Clinical Social Worker

## 2017-01-14 NOTE — Patient Outreach (Addendum)
[facility]) Care Management  01/14/2017  Bonnie Henry [birth year] [id number]  Assessment-CSW completed outreach attempt today to patient's daughter. CSW unable to reach daughter successfully. CSW left a HIPPA compliant voice message encouraging family to return call once available. CSW will send outreach barrier letter out at this time and await to hear back from patient.   Plan-CSW will mail outreach letter to patient at this time.  [name], BSW, MSW, [email or web address] Phone: [phone] Fax: [phone]

## 2017-01-15 ENCOUNTER — Other Ambulatory Visit: Payer: Self-pay | Admitting: Neurology

## 2017-01-15 ENCOUNTER — Other Ambulatory Visit: Payer: Self-pay | Admitting: Family Medicine

## 2017-01-15 DIAGNOSIS — F333 Major depressive disorder, recurrent, severe with psychotic symptoms: Secondary | ICD-10-CM | POA: Diagnosis not present

## 2017-01-16 DIAGNOSIS — F333 Major depressive disorder, recurrent, severe with psychotic symptoms: Secondary | ICD-10-CM | POA: Diagnosis not present

## 2017-01-16 NOTE — Telephone Encounter (Signed)
Patient should be getting psychiatric medicines from psychiatrist only to prevent mix-up in doses or medicines Please ask her to contact her psychiatrist for medicine refills Thank you

## 2017-01-16 NOTE — Telephone Encounter (Signed)
Left detailed voicemial 

## 2017-01-20 ENCOUNTER — Encounter: Payer: Self-pay | Admitting: Psychology

## 2017-01-23 ENCOUNTER — Ambulatory Visit (INDEPENDENT_AMBULATORY_CARE_PROVIDER_SITE_OTHER): Payer: Medicare Other | Admitting: Family Medicine

## 2017-01-23 ENCOUNTER — Encounter: Payer: Self-pay | Admitting: Family Medicine

## 2017-01-23 DIAGNOSIS — E1121 Type 2 diabetes mellitus with diabetic nephropathy: Secondary | ICD-10-CM

## 2017-01-23 DIAGNOSIS — E7800 Pure hypercholesterolemia, unspecified: Secondary | ICD-10-CM

## 2017-01-23 DIAGNOSIS — R350 Frequency of micturition: Secondary | ICD-10-CM

## 2017-01-23 DIAGNOSIS — M79641 Pain in right hand: Secondary | ICD-10-CM

## 2017-01-23 DIAGNOSIS — R779 Abnormality of plasma protein, unspecified: Secondary | ICD-10-CM | POA: Diagnosis not present

## 2017-01-23 DIAGNOSIS — D508 Other iron deficiency anemias: Secondary | ICD-10-CM

## 2017-01-23 DIAGNOSIS — Z5181 Encounter for therapeutic drug level monitoring: Secondary | ICD-10-CM

## 2017-01-23 DIAGNOSIS — M79642 Pain in left hand: Secondary | ICD-10-CM | POA: Diagnosis not present

## 2017-01-23 MED ORDER — OXYCODONE-ACETAMINOPHEN 5-325 MG PO TABS: 0.5000 | ORAL_TABLET | Freq: Four times a day (QID) | ORAL | 0 refills | Status: DC | PRN

## 2017-01-23 NOTE — Assessment & Plan Note (Signed)
Check liver and kidneys 

## 2017-01-23 NOTE — Assessment & Plan Note (Signed)
Check UPEP and SPEP 

## 2017-01-23 NOTE — Assessment & Plan Note (Signed)
Check CBC 

## 2017-01-23 NOTE — Assessment & Plan Note (Signed)
Last lipids at endo reviewed

## 2017-01-23 NOTE — Progress Notes (Signed)
There were no vitals taken for this visit.   Subjective:    Patient ID: Bonnie Henry, female    DOB: [birth year], 77 y.o.   MRN: [id number]  HPI: Bonnie Henry is a 77 y.o. female  Chief Complaint  Patient presents with  . Hospitalization Follow-up  . Hand Pain    Pt c/o pain and swelling and hands   . Urinary Frequency    Feels like a bladder spasm    HPI She was hospitalized, discharged Dec 18th; seeing psychiatrist Still depressed with daily tearfulness; crying spells; no thoughts of hurting herself; she is thinking about screaming at her daughter; I encouraged her to talk to her therapist about this; daughter feels safe at home  Hand pain; pain and swelling in her hands; more pain than swelling; that started about a week or more ago; can't make a tight fist; right handed; she has tried tylenol, ice, heat, different kinds of creams; pain in both biceps and in her back; decreased ROM in the shoulders; she has seen a rheumatologist at [facility]  Type 2 diabetes; sugars are controlled, change in medicine; A1c was 8.3 in Sept 2018; urine microalb:Cr normal in September 2018 High cholesterol; total chol 172, TG 187, HDL 49.7, LDL 85 in Sept 2018  Review of labs: M-spike 0.2 (H), ESR 36 (H); mother and MGM both had rheumatoid arthritis  She feels like she might have bladder spasms; unable to give a urine right now  Depression screen [facility] 2/9 01/23/2017 12/09/2016 11/21/2016 10/10/2016 08/22/2016  Decreased Interest 0 0 0 0 0  Down, Depressed, Hopeless 2 0 0 1 1  PHQ - 2 Score 2 0 0 1 1  Altered sleeping 2 - - - -  Tired, decreased energy 0 - - - -  Change in appetite 2 - - - -  Feeling bad or failure about yourself  1 - - - -  Trouble concentrating 1 - - - -  Moving slowly or fidgety/restless 0 - - - -  Suicidal thoughts 0 - - - -  PHQ-9 Score 8 - - - -  Difficult doing work/chores Very difficult - - - -   Relevant past medical, surgical, family and social history  reviewed Past Medical History:  Diagnosis Date  . Anemia   . Anxiety   . Aortic atherosclerosis ([place]) 12/09/2016   Chest CT Dec 2018  . Arthritis    "joints ache all over" . osteoarthritis  . Asthma   . Atrial fibrillation ([place])   . Breast cancer, left breast (HCC)    S/P mastectomy  . CAD (coronary artery disease)   . CHF (congestive heart failure) ([place])   . Chronic back pain   . Constipation 03/04/2015  . COPD (chronic obstructive pulmonary disease) ([place])   . Depression, major   . Diabetes mellitus without complication ([place])   . DVT (deep venous thrombosis) ([place])   . GERD (gastroesophageal reflux disease)   . Headache   . Heart murmur   . Hepatitis    HEP "C"  . History of blood transfusion    "when I had my mastectomy"  . History of hiatal hernia   . History of right-sided carotid endarterectomy 06/10/2015  . Hypercholesteremia   . Hypertension   . Morbid obesity ([place])   . Myocardial infarction ([place])   . Neuropathy   . Peripheral vascular disease ([place])   . Post-surgical hypothyroidism   . Restless leg syndrome   . Shortness of  breath dyspnea   . Sleep apnea    "2015 wore mask; lost weight; told didn't need mask anymore" (02/23/2014)  . Type II diabetes mellitus ([place])    Past Surgical History:  Procedure Laterality Date  . ARTERY BIOPSY Right 09/29/2014   Procedure: BIOPSY TEMPORAL ARTERY;  Surgeon: Angelia Mould, MD;  Location: [facility];  Service: Vascular;  Laterality: Right;  . BARTHOLIN CYST MARSUPIALIZATION    . BREAST BIOPSY Left    Mastectomy  . CARDIAC CATHETERIZATION  2000's  . CAROTID ENDARTERECTOMY Left 2009  . CATARACT EXTRACTION W/ INTRAOCULAR LENS  IMPLANT, BILATERAL Bilateral ~ 2011  . CORONARY ANGIOPLASTY WITH STENT PLACEMENT  1990's X 2   "2; 1"  . ENDARTERECTOMY Right 05/16/2015   Procedure: ENDARTERECTOMY CAROTID;  Surgeon: Katha Cabal, MD;  Location: [facility];  Service: Vascular;  Laterality: Right;  . ESOPHAGOGASTRODUODENOSCOPY (EGD)  WITH PROPOFOL N/A 12/12/2014   Procedure: ESOPHAGOGASTRODUODENOSCOPY (EGD) WITH PROPOFOL;  Surgeon: Lucilla Lame, MD;  Location: [facility] ENDOSCOPY;  Service: Endoscopy;  Laterality: N/A;  . EYE SURGERY Bilateral    Catract Extraction with IOL  . HAMMER TOE SURGERY Left    2nd and 3rd digits  . JOINT REPLACEMENT Bilateral    Total Hip Replacement  . JOINT REPLACEMENT Left    Total Knee Replacement  . LEFT HEART CATHETERIZATION WITH CORONARY ANGIOGRAM N/A 02/24/2014   Procedure: LEFT HEART CATHETERIZATION WITH CORONARY ANGIOGRAM;  Surgeon: Leonie Man, MD;  Location: [facility] CATH LAB;  Service: Cardiovascular;  Laterality: N/A;  . MASTECTOMY Left 1990's  . SHOULDER SURGERY Right    "cleaned it out; no scope"  . THYROIDECTOMY, PARTIAL  1950's  . TOE SURGERY Right    "cut piece of bone out so toe didn't dig into my foot"  . TONSILLECTOMY  1950's  . TOTAL HIP ARTHROPLASTY Bilateral 1990's  . TOTAL KNEE ARTHROPLASTY Left 1990's  . TRIGGER FINGER RELEASE Left   . TUBAL LIGATION    . VAGINAL HYSTERECTOMY     "partial"   Family History  Problem Relation Age of Onset  . Arthritis Mother   . Cancer Mother   . Diabetes Mother   . Heart disease Mother   . Hyperlipidemia Mother   . Hypertension Mother   . Alcohol abuse Father   . Brain cancer Father   . Arthritis Maternal Grandmother   . Alzheimer's disease Maternal Grandmother   . Diabetes Daughter   . Hyperlipidemia Daughter   . Hypertension Daughter   . Thyroid cancer Daughter   . Diabetes Son   . Diabetes Daughter   . Hypertension Daughter   . Diabetes Son   . Hypertension Son   . Hyperlipidemia Son   . Thyroid cancer Son   . Diabetes Brother   . Diabetes Brother    Social History   Tobacco Use  . Smoking status: Former Smoker    Packs/day: 1.50    Years: 44.00    Pack years: 66.00    Types: Cigarettes    Last attempt to quit: 01/06/1994    Years since quitting: 23.0  . Smokeless tobacco: Never Used  . Tobacco comment:  "quit smoking cigarettes in ~ 2000"  Substance Use Topics  . Alcohol use: No    Alcohol/week: 0.0 oz    Comment: 02/23/2014 "no alcohol since 1990's"  . Drug use: No    Interim medical history since last visit reviewed. Allergies and medications reviewed  Review of Systems Per HPI unless specifically indicated above  Objective:    There were no vitals taken for this visit.  Wt Readings from Last 3 Encounters:  01/26/17 200 lb (90.7 kg)  01/13/17 207 lb (93.9 kg)  12/09/16 205 lb (93 kg)    Physical Exam  Constitutional: She appears well-developed and well-nourished. No distress.  HENT:  Head: Normocephalic and atraumatic.  Eyes: EOM are normal. No scleral icterus.  Neck: No thyromegaly present.  Cardiovascular: Normal rate, regular rhythm and normal heart sounds.  No murmur heard. Pulmonary/Chest: Effort normal and breath sounds normal. No respiratory distress. She has no wheezes.  Abdominal: Soft. Bowel sounds are normal. She exhibits no distension.  Musculoskeletal: Normal range of motion. She exhibits no edema.  Neurological: She is alert. She exhibits normal muscle tone.  Skin: Skin is warm and dry. She is not diaphoretic. No pallor.  Psychiatric: She has a normal mood and affect. Her behavior is normal. Judgment and thought content normal.   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)    Last urine microalb:Cr normal and A1c 8.3; foot exam today; followed primarily by  endo      Relevant Orders   Hemoglobin A1c (Completed)     Other   Medication monitoring encounter    Check liver and kidneys      Relevant Orders   Valproic Acid level (Completed)   COMPLETE METABOLIC PANEL WITH GFR (Completed)   High cholesterol (Chronic)    Last lipids at endo reviewed      Relevant Medications   amLODipine (NORVASC) 5 MG tablet   furosemide (LASIX) 40 MG tablet   Anemia, iron deficiency    Check CBC      Relevant Orders   CBC with Differential/Platelet (Completed)   Abnormality of plasma protein    Check UPEP and SPEP      Relevant Orders   Protein,Total and Electrophor w/IFE (Completed)   Protein Electrophoresis,Random Urn (Completed)    Other Visit Diagnoses    Bilateral hand pain    -  Primary   with hx of elev ESR, pain at base of thumbs; check labs to see if inflammatory arthritis   Relevant Orders   ANA,IFA RA Diag Pnl w/rflx Tit/Patn (Completed)   Cyclic citrul peptide antibody, IgG   C-reactive protein (Completed)   Urine frequency       Relevant Orders   Urinalysis w microscopic + reflex cultur (Completed)       Follow up plan: Return in about 3 months (around 04/23/2017) for follow-up visit with Dr. Sanda Klein.  An after-visit summary was printed and given to the patient at [facility].  Please see the patient instructions which may contain other information and recommendations beyond what is mentioned above in the assessment and plan.  Meds ordered this encounter  Medications  . oxyCODONE-acetaminophen (ROXICET) 5-325 MG tablet    Sig: Take 0.5-1 tablets by mouth every 6 (six) hours as  needed for moderate pain or severe pain.    Dispense:  20 tablet    Refill:  0    Orders Placed This Encounter  Procedures  . Urine Culture  . Valproic Acid level  . Protein,Total and Electrophor w/IFE  . Hemoglobin A1c  . COMPLETE METABOLIC PANEL WITH GFR  . CBC with Differential/Platelet  . ANA,IFA RA Diag Pnl w/rflx Tit/Patn  . Cyclic  citrul peptide antibody, IgG  . C-reactive protein  . Urinalysis w microscopic + reflex cultur  . Protein Electrophoresis,Random Urn  . REFLEXIVE URINE CULTURE

## 2017-01-23 NOTE — Patient Instructions (Addendum)
Please do see the endocrinologist as soon as they can get you in [facility] see what the labs show If you have not heard anything from my staff in a week about any orders/referrals/studies from today, please contact us here to follow-up [phone] Please do follow-up with your psychiatrist

## 2017-01-23 NOTE — Assessment & Plan Note (Signed)
Last urine microalb:Cr normal and A1c 8.3; foot exam today; followed primarily by endo

## 2017-01-24 ENCOUNTER — Other Ambulatory Visit: Payer: Self-pay | Admitting: Family Medicine

## 2017-01-24 DIAGNOSIS — M25561 Pain in right knee: Secondary | ICD-10-CM | POA: Diagnosis not present

## 2017-01-24 MED ORDER — DOXYCYCLINE HYCLATE 100 MG PO TABS: 100.0000 mg | ORAL_TABLET | Freq: Two times a day (BID) | ORAL | 0 refills | Status: DC

## 2017-01-24 NOTE — Progress Notes (Signed)
Rx for antibiotic sent in

## 2017-01-26 ENCOUNTER — Emergency Department (HOSPITAL_COMMUNITY)
Admission: EM | Admit: 2017-01-26 | Discharge: 2017-01-26 | Disposition: A | Discharge status: Home / Self Care | Payer: Medicare Other | Source: Home / Self Care | Attending: Emergency Medicine | Admitting: Emergency Medicine

## 2017-01-26 ENCOUNTER — Encounter (HOSPITAL_COMMUNITY): Payer: Self-pay | Admitting: Emergency Medicine

## 2017-01-26 ENCOUNTER — Other Ambulatory Visit: Payer: Self-pay

## 2017-01-26 ENCOUNTER — Emergency Department (HOSPITAL_COMMUNITY): Payer: Medicare Other

## 2017-01-26 ENCOUNTER — Other Ambulatory Visit: Payer: Self-pay | Admitting: Neurology

## 2017-01-26 DIAGNOSIS — I509 Heart failure, unspecified: Secondary | ICD-10-CM

## 2017-01-26 DIAGNOSIS — I25110 Atherosclerotic heart disease of native coronary artery with unstable angina pectoris: Secondary | ICD-10-CM | POA: Insufficient documentation

## 2017-01-26 DIAGNOSIS — M25511 Pain in right shoulder: Secondary | ICD-10-CM | POA: Diagnosis not present

## 2017-01-26 DIAGNOSIS — R52 Pain, unspecified: Secondary | ICD-10-CM

## 2017-01-26 DIAGNOSIS — F333 Major depressive disorder, recurrent, severe with psychotic symptoms: Secondary | ICD-10-CM | POA: Diagnosis not present

## 2017-01-26 DIAGNOSIS — Y999 Unspecified external cause status: Secondary | ICD-10-CM

## 2017-01-26 DIAGNOSIS — Y939 Activity, unspecified: Secondary | ICD-10-CM | POA: Insufficient documentation

## 2017-01-26 DIAGNOSIS — S199XXA Unspecified injury of neck, initial encounter: Secondary | ICD-10-CM | POA: Diagnosis not present

## 2017-01-26 DIAGNOSIS — M546 Pain in thoracic spine: Secondary | ICD-10-CM | POA: Diagnosis not present

## 2017-01-26 DIAGNOSIS — M545 Low back pain: Secondary | ICD-10-CM | POA: Insufficient documentation

## 2017-01-26 DIAGNOSIS — I110 Hypertensive heart disease with heart failure: Secondary | ICD-10-CM | POA: Insufficient documentation

## 2017-01-26 DIAGNOSIS — I10 Essential (primary) hypertension: Secondary | ICD-10-CM | POA: Diagnosis not present

## 2017-01-26 DIAGNOSIS — E1143 Type 2 diabetes mellitus with diabetic autonomic (poly)neuropathy: Secondary | ICD-10-CM

## 2017-01-26 DIAGNOSIS — I5032 Chronic diastolic (congestive) heart failure: Secondary | ICD-10-CM | POA: Diagnosis not present

## 2017-01-26 DIAGNOSIS — J449 Chronic obstructive pulmonary disease, unspecified: Secondary | ICD-10-CM | POA: Insufficient documentation

## 2017-01-26 DIAGNOSIS — Z794 Long term (current) use of insulin: Secondary | ICD-10-CM

## 2017-01-26 DIAGNOSIS — Y929 Unspecified place or not applicable: Secondary | ICD-10-CM | POA: Insufficient documentation

## 2017-01-26 DIAGNOSIS — M25552 Pain in left hip: Secondary | ICD-10-CM | POA: Insufficient documentation

## 2017-01-26 DIAGNOSIS — R918 Other nonspecific abnormal finding of lung field: Secondary | ICD-10-CM | POA: Diagnosis not present

## 2017-01-26 DIAGNOSIS — E1140 Type 2 diabetes mellitus with diabetic neuropathy, unspecified: Secondary | ICD-10-CM | POA: Diagnosis not present

## 2017-01-26 DIAGNOSIS — S299XXA Unspecified injury of thorax, initial encounter: Secondary | ICD-10-CM | POA: Diagnosis not present

## 2017-01-26 DIAGNOSIS — M549 Dorsalgia, unspecified: Secondary | ICD-10-CM

## 2017-01-26 DIAGNOSIS — W010XXA Fall on same level from slipping, tripping and stumbling without subsequent striking against object, initial encounter: Secondary | ICD-10-CM

## 2017-01-26 DIAGNOSIS — W19XXXA Unspecified fall, initial encounter: Secondary | ICD-10-CM

## 2017-01-26 DIAGNOSIS — G9340 Encephalopathy, unspecified: Secondary | ICD-10-CM | POA: Diagnosis not present

## 2017-01-26 DIAGNOSIS — N390 Urinary tract infection, site not specified: Secondary | ICD-10-CM | POA: Diagnosis not present

## 2017-01-26 DIAGNOSIS — R4182 Altered mental status, unspecified: Secondary | ICD-10-CM | POA: Diagnosis not present

## 2017-01-26 DIAGNOSIS — J9610 Chronic respiratory failure, unspecified whether with hypoxia or hypercapnia: Secondary | ICD-10-CM | POA: Diagnosis not present

## 2017-01-26 DIAGNOSIS — T148XXA Other injury of unspecified body region, initial encounter: Secondary | ICD-10-CM | POA: Diagnosis not present

## 2017-01-26 DIAGNOSIS — S3992XA Unspecified injury of lower back, initial encounter: Secondary | ICD-10-CM | POA: Diagnosis not present

## 2017-01-26 DIAGNOSIS — S4991XA Unspecified injury of right shoulder and upper arm, initial encounter: Secondary | ICD-10-CM | POA: Diagnosis not present

## 2017-01-26 DIAGNOSIS — G9341 Metabolic encephalopathy: Secondary | ICD-10-CM | POA: Diagnosis not present

## 2017-01-26 DIAGNOSIS — S79912A Unspecified injury of left hip, initial encounter: Secondary | ICD-10-CM | POA: Diagnosis not present

## 2017-01-26 LAB — CBG MONITORING, ED: GLUCOSE-CAPILLARY: 163 mg/dL — AB (ref 65–99)

## 2017-01-26 MED ORDER — FENTANYL CITRATE (PF) 100 MCG/2ML IJ SOLN
50.0000 ug | Freq: Once | INTRAMUSCULAR | Status: DC
  Filled 2017-01-26: qty 2

## 2017-01-26 MED ORDER — ALBUTEROL SULFATE (2.5 MG/3ML) 0.083% IN NEBU: 5.0000 mg | INHALATION_SOLUTION | Freq: Once | RESPIRATORY_TRACT | Status: DC

## 2017-01-26 MED ORDER — IPRATROPIUM BROMIDE 0.02 % IN SOLN: 0.5000 mg | Freq: Once | RESPIRATORY_TRACT | Status: DC

## 2017-01-26 MED ORDER — IPRATROPIUM BROMIDE 0.02 % IN SOLN: 1.0000 mg | Freq: Once | RESPIRATORY_TRACT | Status: DC

## 2017-01-26 NOTE — ED Notes (Signed)
Patient transported to X-ray 

## 2017-01-26 NOTE — ED Triage Notes (Addendum)
Per EMS. Pt from home. Tripped and fell this afternoon. Pt complains of lower back pain and R shoulder pain. No deformities, LOC, blood thinners, or neck pain. Was given 19mcg fentanyl intranasal in route. Pt on 3L [place] at home

## 2017-01-26 NOTE — ED Notes (Signed)
Bed: [facility] Expected date:  Expected time:  Means of arrival:  Comments: 

## 2017-01-26 NOTE — Discharge Instructions (Signed)
Please follow up with your primary doctor within the next 3-5 days. Please return to the ER sooner if you have any new or worsening symptoms.

## 2017-01-26 NOTE — ED Provider Notes (Signed)
[facility] DEPT Provider Note   CSN: [id number] .  Patient states that she turned around date & time: 01/26/17  1722     History   Chief Complaint Chief Complaint  Patient presents with  . Fall  . Back Pain    HPI Bonnie Henry is a 77 y.o. female.  HPI   Patient is a 77 year old female who is presenting to the ED today s/P fall that occurred PTA. Pt states that she turned around and "got my feet tangled up", lost her balance, fell onto her buttocks, and then her mid back.  Denies that she hit her head. Denies LOC, HA, NV, or neck pain. Pt denies any prodrome to the fall such as tunnel vision, nausea, lightheadedness, CP, palpitations or SOB. She denies any current SOB, CP, palpitations, orabd pain.  She is complaining of right shoulder pain, mid back pain and lower back pain. Denies any urinary or bowel incontinence.  Denies any numbness/weakness to her bilateral lower extremities.  She is not on any blood thinners.  Past Medical History:  Diagnosis Date  . Anemia   . Anxiety   . Aortic atherosclerosis ([place]) 12/09/2016   Chest CT Dec 2018  . Arthritis    "joints ache all over" . osteoarthritis  . Asthma   . Atrial fibrillation ([place])   . Breast cancer, left breast (HCC)    S/P mastectomy  . CAD (coronary artery disease)   . CHF (congestive heart failure) ([place])   . Chronic back pain   . Constipation 03/04/2015  . COPD (chronic obstructive pulmonary disease) ([place])   . Depression, major   . Diabetes mellitus without complication ([place])   . DVT (deep venous thrombosis) ([place])   . GERD (gastroesophageal reflux disease)   . Headache   . Heart murmur   . Hepatitis    HEP "C"  . History of blood transfusion    "when I had my mastectomy"  . History of hiatal hernia   . History of right-sided carotid endarterectomy 06/10/2015  . Hypercholesteremia   . Hypertension   . Morbid obesity ([place])   . Myocardial infarction ([place])   . Neuropathy     . Peripheral vascular disease ([place])   . Post-surgical hypothyroidism   . Restless leg syndrome   . Shortness of breath dyspnea   . Sleep apnea    "2015 wore mask; lost weight; told didn't need mask anymore" (02/23/2014)  . Type II diabetes mellitus [facility])     Patient Active Problem List   Diagnosis Date Noted  . Abnormality of plasma protein 01/23/2017  . Dependent edema 12/22/2016  . Severe episode of recurrent major depressive disorder, with psychotic features ([place]) 12/11/2016  . Depression, major, recurrent, severe with psychosis ([place]) 12/09/2016  . Mild dementia 12/09/2016  . Aortic atherosclerosis ([place]) 12/09/2016  . Facet arthropathy, lumbar 11/21/2016  . Hyperlipidemia due to type 2 diabetes mellitus ([place]) 11/03/2016  . Cognitive impairment 08/26/2016  . Multifactorial gait disorder 08/26/2016  . Diabetic polyneuropathy associated with diabetes mellitus due to underlying condition ([place]) 08/26/2016  . Osteoarthritis of right knee 07/23/2016  . Graves' disease with exophthalmos 05/29/2016  . Medication monitoring encounter 05/26/2016  . Renal insufficiency 08/03/2015  . Anemia, iron deficiency 07/22/2015  . Metabolic encephalopathy 32/99/2426  . Chronic respiratory failure with hypoxia ([place]) 07/21/2015  . History of right-sided carotid endarterectomy 06/10/2015  . Carotid stenosis, right 05/16/2015  . Low back pain 05/04/2015  . Chronic nonintractable headache 03/30/2015  .  Memory loss, short term 03/30/2015  . Patellar subluxation 03/29/2015  . Constipation 03/04/2015  . Lumbar stenosis with neurogenic claudication 02/23/2015  . DDD (degenerative disc disease), lumbar 02/23/2015  . Cervical radiculitis 02/23/2015  . Encounter for medication monitoring 02/20/2015  . Knee pain, bilateral 02/08/2015  . Frequent falls 01/22/2015  . 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 Chronic interstitial lung disease ([place]) 10/26/2014  . Centrilobular emphysema ([place]) 10/26/2014  .  Vision blurred 09/28/2014  . Acquired exophthalmos 09/28/2014  . Diabetes mellitus with diabetic neuropathy ([place]) 08/31/2014  . Hammertoe 08/31/2014  . Foot callus 08/31/2014  . 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 Anemia 05/21/2014  . Chronic pain 03/02/2014  . Depression 03/02/2014  . Acid reflux 03/02/2014  . Glaucoma 03/02/2014  . Adult hypothyroidism 03/02/2014  . Apnea, sleep 03/02/2014  . PAT (paroxysmal atrial tachycardia) ([place]) 02/24/2014  . Unstable angina ([place]) 02/23/2014  . Type 2 diabetes with nephropathy ([place]) 02/23/2014  . HTN (hypertension) 02/23/2014  . CAD S/P remote PCI  02/23/2014  . High cholesterol 02/23/2014  . PVD- h/o LCEA 02/23/2014  . Morbid obesity ([place]) 02/23/2014  . Aortic stenosis murmur 02/23/2014  . COPD, moderate ([place]) 07/27/2013    Past Surgical History:  Procedure Laterality Date  . ARTERY BIOPSY Right 09/29/2014   Procedure: BIOPSY TEMPORAL ARTERY;  Surgeon: Angelia Mould, MD;  Location: [facility];  Service: Vascular;  Laterality: Right;  . BARTHOLIN CYST MARSUPIALIZATION    . BREAST BIOPSY Left    Mastectomy  . CARDIAC CATHETERIZATION  2000's  . CAROTID ENDARTERECTOMY Left 2009  . CATARACT EXTRACTION W/ INTRAOCULAR LENS  IMPLANT, BILATERAL Bilateral ~ 2011  . CORONARY ANGIOPLASTY WITH STENT PLACEMENT  1990's X 2   "2; 1"  . ENDARTERECTOMY Right 05/16/2015   Procedure: ENDARTERECTOMY CAROTID;  Surgeon: Katha Cabal, MD;  Location: [facility];  Service: Vascular;  Laterality: Right;  . ESOPHAGOGASTRODUODENOSCOPY (EGD) WITH PROPOFOL N/A 12/12/2014   Procedure: ESOPHAGOGASTRODUODENOSCOPY (EGD) WITH PROPOFOL;  Surgeon: Lucilla Lame, MD;  Location: [facility] ENDOSCOPY;  Service: Endoscopy;  Laterality: N/A;  . EYE SURGERY Bilateral    Catract Extraction with IOL  . HAMMER TOE SURGERY Left    2nd and 3rd digits  . JOINT REPLACEMENT Bilateral    Total Hip Replacement  . JOINT REPLACEMENT Left    Total Knee Replacement  . LEFT HEART  CATHETERIZATION WITH CORONARY ANGIOGRAM N/A 02/24/2014   Procedure: LEFT HEART CATHETERIZATION WITH CORONARY ANGIOGRAM;  Surgeon: Leonie Man, MD;  Location: [facility] CATH LAB;  Service: Cardiovascular;  Laterality: N/A;  . MASTECTOMY Left 1990's  . SHOULDER SURGERY Right    "cleaned it out; no scope"  . THYROIDECTOMY, PARTIAL  1950's  . TOE SURGERY Right    "cut piece of bone out so toe didn't dig into my foot"  . TONSILLECTOMY  1950's  . TOTAL HIP ARTHROPLASTY Bilateral 1990's  . TOTAL KNEE ARTHROPLASTY Left 1990's  . TRIGGER FINGER RELEASE Left   . TUBAL LIGATION    . VAGINAL HYSTERECTOMY     "partial"    OB History    Gravida Para Term Preterm AB Living   0 0 0 0 0     SAB TAB Ectopic Multiple Live Births   0 0 0           Home Medications    Prior to Admission medications   Medication Sig Start Date End Date Taking? Authorizing Provider  acetaminophen (TYLENOL) 500 MG tablet Take 2 tablets (1,000  mg total) by mouth every 6 (six) hours as needed for moderate pain. 05/08/15  Yes Duffy Bruce, MD  albuterol (ACCUNEB) 0.63 MG/3ML nebulizer solution Take 1 ampule by nebulization every 6 (six) hours as needed for wheezing or shortness of breath.    Yes [provider]  amLODipine (NORVASC) 5 MG tablet Take 5 mg by mouth daily.  12/22/16  Yes [provider]  ARIPiprazole (ABILIFY) 2 MG tablet Take 2 mg by mouth daily.  01/16/17  Yes [provider]  Ascorbic Acid (VITAMIN C PO) Take 1 tablet by mouth 3 (three) times a week. MWF   Yes [provider]  aspirin EC 81 MG tablet Take 81 mg by mouth daily.   Yes [provider]  atorvastatin (LIPITOR) 80 MG tablet Take 1 tablet (80 mg total) by mouth at bedtime. 08/07/16  Yes Lada, Satira Anis, MD  budesonide (PULMICORT) 0.5 MG/2ML nebulizer solution Take 0.5 mg by nebulization 2 (two) times daily.    Yes [provider]  busPIRone (BUSPAR) 15 MG tablet TAKE ONE TABLET BY MOUTH TWICE DAILY  12/04/16  Yes Lada, Satira Anis, MD  carvedilol (COREG) 12.5 MG tablet Take 12.5 mg by mouth 2 (two) times daily with a meal.  01/24/14  Yes [provider]  divalproex (DEPAKOTE ER) 250 MG 24 hr tablet Take 250 mg by mouth every evening.  01/16/17  Yes [provider]  DULoxetine (CYMBALTA) 60 MG capsule Take 1 capsule (60 mg total) by mouth daily. 08/22/16  Yes Lada, Satira Anis, MD  formoterol (PERFOROMIST) 20 MCG/2ML nebulizer solution Inhale 20 mcg into the lungs 2 (two) times daily.    Yes [provider]  gabapentin (NEURONTIN) 600 MG tablet Take 1 tablet (600 mg total) 2 (two) times daily by mouth. 11/21/16  Yes Lada, Satira Anis, MD  Hypromellose (ARTIFICIAL TEARS OP) Place 1 drop into both eyes as needed (dry eyes).    Yes [provider]  insulin regular (NOVOLIN R,HUMULIN R) 100 units/mL injection Inject 28 Units 3 (three) times daily before meals into the skin. Based on sliding scale    Yes [provider]  levothyroxine (SYNTHROID, LEVOTHROID) 100 MCG tablet Take 1 tablet (100 mcg total) by mouth every other day. (alternate with the 112 mcg strength) 09/08/14  Yes Lada, Satira Anis, MD  levothyroxine (SYNTHROID, LEVOTHROID) 112 MCG tablet Take 1 tablet (112 mcg total) by mouth every other day. (alternate with the 100 mcg strength) Patient taking differently: Take 112 mcg by mouth See admin instructions. Take 1 tablet (112 mcg) by mouth before breakfast on Friday, Saturday, Sunday (take 100 mcg other days) 09/08/14  Yes Lada, Satira Anis, MD  montelukast (SINGULAIR) 10 MG tablet TAKE ONE TABLET BY MOUTH ONCE DAILY 01/07/17  Yes Lada, Satira Anis, MD  oxyCODONE-acetaminophen (ROXICET) 5-325 MG tablet Take 0.5-1 tablets by mouth every 6 (six) hours as needed for moderate pain or severe pain. 01/23/17  Yes Lada, Satira Anis, MD  OXYGEN Inhale 2 L into the lungs continuous.   Yes [provider]  pantoprazole (PROTONIX) 20 MG tablet TAKE ONE TABLET BY MOUTH TWICE  DAILY 06/03/16  Yes Lada, Satira Anis, MD  ranitidine (ZANTAC) 300 MG tablet Take 1 tablet (300 mg total) by mouth at bedtime. 01/15/16  Yes Lada, Satira Anis, MD  rOPINIRole (REQUIP) 1 MG tablet TAKE 1 TABLET BY MOUTH THREE TIMES DAILY 11/13/16  Yes Lada, Satira Anis, MD  spironolactone (ALDACTONE) 25 MG tablet Take 25 mg by  mouth daily. 08/27/14  Yes [provider]  albuterol (PROAIR HFA) 108 (90 BASE) MCG/ACT inhaler Inhale 2 puffs into the lungs every 4 (four) hours as needed for wheezing or shortness of breath.     [provider]  bumetanide (BUMEX) 0.5 MG tablet Take 0.5 mg by mouth daily as needed (fluid).     [provider]  doxycycline (VIBRA-TABS) 100 MG tablet Take 1 tablet (100 mg total) by mouth 2 (two) times daily for 7 days. 01/24/17 01/31/17  Arnetha Courser, MD  fluticasone (FLONASE) 50 MCG/ACT nasal spray Place 2 sprays into both nostrils as needed for allergies.  10/16/14   [provider]  furosemide (LASIX) 40 MG tablet Take by mouth. 12/23/16   [provider]  insulin NPH Human (HUMULIN N,NOVOLIN N) 100 UNIT/ML injection Inject 24 Units into the skin at bedtime.  06/14/15 01/13/17  [provider]  meclizine (ANTIVERT) 25 MG tablet Take 0.5-1 tablets (12.5-25 mg total) by mouth 3 (three) times daily as needed for dizziness. 05/29/16   Arnetha Courser, MD  Multiple Vitamin (THERA) TABS Take by mouth daily.  12/23/16   [provider]  nitroGLYCERIN (NITROSTAT) 0.4 MG SL tablet Place 1 tablet (0.4 mg total) every 5 (five) minutes as needed under the tongue for chest pain. Max of 3 pills total, call 911 Patient not taking: Reported on 01/23/2017 11/21/16   Arnetha Courser, MD  Omega-3 Fatty Acids (FISH OIL) 1200 MG CAPS Take 1 capsule by mouth 2 (two) times a week.     [provider]  tiZANidine (ZANAFLEX) 2 MG tablet TAKE 1 TABLET BY MOUTH AT BEDTIME Patient not taking: Reported on 01/26/2017 09/17/16   Alda Berthold, DO     Family History Family History  Problem Relation Age of Onset  . Arthritis Mother   . Cancer Mother   . Diabetes Mother   . Heart disease Mother   . Hyperlipidemia Mother   . Hypertension Mother   . Alcohol abuse Father   . Brain cancer Father   . Arthritis Maternal Grandmother   . Alzheimer's disease Maternal Grandmother   . Diabetes Daughter   . Hyperlipidemia Daughter   . Hypertension Daughter   . Thyroid cancer Daughter   . Diabetes Son   . Diabetes Daughter   . Hypertension Daughter   . Diabetes Son   . Hypertension Son   . Hyperlipidemia Son   . Thyroid cancer Son   . Diabetes Brother   . Diabetes Brother     Social History Social History   Tobacco Use  . Smoking status: Former Smoker    Packs/day: 1.50    Years: 44.00    Pack years: 66.00    Types: Cigarettes    Last attempt to quit: 01/06/1994    Years since quitting: 23.0  . Smokeless tobacco: Never Used  . Tobacco comment: "quit smoking cigarettes in ~ 2000"  Substance Use Topics  . Alcohol use: No    Alcohol/week: 0.0 oz    Comment: 02/23/2014 "no alcohol since 1990's"  . Drug use: No     Allergies   Vasotec [enalapril]; Risperidone; Codeine; and Morphine and related   Review of Systems Review of Systems  Constitutional: Negative for chills and fever.  HENT: Negative for ear pain and sore throat.   Eyes: Negative for pain and visual disturbance.  Respiratory: Negative for cough and shortness of breath.   Cardiovascular: Negative for chest pain and palpitations.  Gastrointestinal: Negative for abdominal pain, constipation, diarrhea, nausea and vomiting.  Genitourinary: Negative for dysuria and hematuria.  Musculoskeletal: Positive for back pain. Negative for arthralgias and neck pain.       Right shoulder pain  Skin: Negative for color change and rash.  Neurological: Negative for seizures, syncope, weakness, light-headedness and headaches.       No head trauma or LOC  All other systems  reviewed and are negative.    Physical Exam Updated Vital Signs BP (!) 135/101 (BP Location: Left Arm)   Pulse 78   Temp 98.1 F (36.7 C) (Oral)   Resp 19   Ht 5' (1.524 m)   Wt 90.7 kg (200 lb)   SpO2 94%   BMI 39.06 kg/m   Physical Exam  Constitutional: She appears well-developed and well-nourished. No distress.  HENT:  Head: Normocephalic and atraumatic.  Right Ear: External ear normal.  Left Ear: External ear normal.  Nose: Nose normal.  Mouth/Throat: Oropharynx is clear and moist.  No battle signs, no raccoons eyes, no rhinorrhea, no hemotympanum. No tenderness to palpation of the skull or face. No deformity or crepitus noted.  Eyes: Conjunctivae and EOM are normal. Pupils are equal, round, and reactive to light.  Neck: Neck supple.  C-spine TTP   Cardiovascular: Normal rate, regular rhythm, normal heart sounds and intact distal pulses.  No murmur heard. Pulmonary/Chest: Effort normal and breath sounds normal. No respiratory distress.  Abdominal: Soft. There is no tenderness.  Musculoskeletal: She exhibits no edema, tenderness or deformity.  Thoracic TTP around T5. Lumbar TTP around L3-L4.  Neurological: She is alert. No cranial nerve deficit or sensory deficit. She exhibits normal muscle tone.  Strength 5/5 to BUE and BLE. Able to flex, extended, abduct and adduct BLE. Pt oriented to self, location, situation, but not date (states this is chronic and she does not normally know the date)  Skin: Skin is warm and dry. Capillary refill takes less than 2 seconds.  No ecchymosis or erythema  Psychiatric: She has a normal mood and affect.  Nursing note and vitals reviewed.    ED Treatments / Results  Labs (all labs ordered are listed, but only abnormal results are displayed) Labs Reviewed  CBG MONITORING, ED - Abnormal; Notable for the following components:      Result Value   Glucose-Capillary 163 (*)    All other components within normal limits    EKG  EKG  Interpretation  Date/Time:  Monday January 26 2017 17:45:53 EST Ventricular Rate:  69 PR Interval:  186 QRS Duration: 78 QT Interval:  404 QTC Calculation: 432 R Axis:   -42 Text Interpretation:  Normal sinus rhythm Left axis deviation Septal infarct , age undetermined Abnormal ECG Poor data quality Confirmed by Dorie Rank [phone]) on 01/26/2017 9:16:13 PM       Radiology Dg Chest 2 View  Result Date: 01/26/2017 CLINICAL DATA:  Initial evaluation for acute trauma, fall. EXAM: CHEST  2 VIEW COMPARISON:  Prior radiograph from 12/09/2016. FINDINGS: Mild cardiomegaly, stable. Mediastinal silhouette normal. Aortic atherosclerosis. Lungs are hypoinflated. No focal infiltrates. No pulmonary edema or pleural effusion. No pneumothorax. No acute osseous abnormality. IMPRESSION: 1. No active cardiopulmonary disease. 2. Aortic atherosclerosis. Electronically Signed   By: Jeannine Boga M.D.   On: 01/26/2017 20:33   Dg Thoracic Spine 2 View  Result Date: 01/26/2017 CLINICAL DATA:  Initial evaluation for acute trauma, fall. EXAM: THORACIC SPINE 2 VIEWS COMPARISON:  None. FINDINGS: Vertebral bodies normally aligned with  preservation of the normal thoracic kyphosis. Vertebral body heights maintained. No acute fracture or malalignment. Moderate multilevel degenerative spondylolysis with osteophytic spurring seen throughout the thoracic spine. Visualized lungs are clear.  Coronary stents noted. IMPRESSION: No radiographic evidence for acute traumatic injury within the thoracic spine. Electronically Signed   By: Jeannine Boga M.D.   On: 01/26/2017 20:26   Dg Lumbar Spine 2-3 Views  Result Date: 01/26/2017 CLINICAL DATA:  Initial evaluation for acute trauma, fall. EXAM: LUMBAR SPINE - 2-3 VIEW COMPARISON:  Prior radiograph from 11/21/2016. FINDINGS: Five non rib-bearing lumbar type vertebral bodies. Trace anterolisthesis of L4 on L5, stable. Vertebral bodies otherwise normally aligned with  preservation of the normal lumbar lordosis. Vertebral body heights maintained. No acute fracture or malalignment. Visualized sacrum intact. Moderate multilevel degenerate spondylolysis and facet arthrosis, greatest at L4-5, stable. Right hip arthroplasty partially visualized. Visualized pelvis intact. Extensive aorto bi-iliac atherosclerosis noted. IMPRESSION: 1. No radiographic evidence for acute traumatic injury within the lumbar spine. 2. Moderate multilevel degenerate spondylolysis and facet arthrosis, stable. Electronically Signed   By: Jeannine Boga M.D.   On: 01/26/2017 20:29   Dg Shoulder Right  Result Date: 01/26/2017 CLINICAL DATA:  Initial evaluation for acute trauma, fall. EXAM: RIGHT SHOULDER - 2+ VIEW COMPARISON:  None. FINDINGS: No acute fracture dislocation. Glenohumeral and acromioclavicular joints approximated. Sequelae of prior rotator cuff repair. Moderate degenerative osteoarthrosis about the shoulder. No acute soft tissue abnormality. IMPRESSION: 1. No acute osseous abnormality about the right shoulder. 2. Moderate degenerative osteoarthrosis with sequelae of prior rotator cuff repair. Electronically Signed   By: Jeannine Boga M.D.   On: 01/26/2017 20:30   Ct Cervical Spine Wo Contrast  Result Date: 01/26/2017 CLINICAL DATA:  Fall today with multiple injuries. EXAM: CT CERVICAL SPINE WITHOUT CONTRAST TECHNIQUE: Multidetector CT imaging of the cervical spine was performed without intravenous contrast. Multiplanar CT image reconstructions were also generated. COMPARISON:  None. FINDINGS: Alignment: Normal. Skull base and vertebrae: No acute fracture identified. No bony lesions seen. Soft tissues and spinal canal: No prevertebral soft tissue swelling identified. No evidence of hemorrhage or soft tissue masses. No incidental lymphadenopathy identified. Disc levels: Moderate degenerative disc disease present at C4-5, C5-6, C6-7 and C7-T1. No listhesis. Upper chest: Negative.  Other: None. IMPRESSION: No acute cervical spine fracture identified. Moderate degenerative disc disease present. Electronically Signed   By: Aletta Edouard M.D.   On: 01/26/2017 19:36   Dg Hip Unilat With Pelvis 2-3 Views Left  Result Date: 01/26/2017 CLINICAL DATA:  Initial evaluation for acute trauma, fall. EXAM: DG HIP (WITH OR WITHOUT PELVIS) 2-3V LEFT COMPARISON:  Prior radiograph from 11/23/2014. 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 No acute fracture or dislocation. No evidence for hardware complication. Limited views of the right hip demonstrate no acute abnormality. SI joints approximated and symmetric. No acute soft tissue abnormality. Degenerative changes noted within the lower lumbar spine. IMPRESSION: 1. No acute osseous abnormality about the left hip. 2. Left total hip arthroplasty in place without hardware complication. Electronically Signed   By: Jeannine Boga M.D.   On: 01/26/2017 20:32    Procedures Procedures (including critical care time)  Medications Ordered in ED Medications  fentaNYL (SUBLIMAZE) injection 50 mcg (50 mcg Intravenous Refused 01/26/17 1858)  albuterol (PROVENTIL) (2.5 MG/3ML) 0.083% nebulizer solution 5 mg (not administered)  ipratropium (ATROVENT) nebulizer solution 0.5 mg (not administered)     Initial Impression / Assessment and Plan / ED Course  I have reviewed the triage vital  signs and the nursing notes.  Pertinent labs & imaging results that were available during my care of the patient were reviewed by me and considered in my medical decision making (see chart for details).    Rechecked pt. She is sitting in the chair and is not wearing a c-collar. States she took it off by herself. She has FROM to the neck without pain. She is denying any continued pain and states that she is ready to go. Denies chest pain or SOB. She is ambulating around the room in NAD. VSS. Discussed results of the imaging and plan for discharge. Pt is  agreeable to the plan and has no further questions. Advised f/u with PCP and return to ER precautions given.   Final Clinical Impressions(s) / ED Diagnoses   Final diagnoses:  Fall, initial encounter  Left hip pain  Acute pain of right shoulder  Acute back pain, unspecified back location, unspecified back pain laterality   77 y/o F presenting s/p fall. TTP along cervical, lumbar and thoracic spine noted, imaging negative for acute fracture. Also with right shoulder/anterior chest TTP and left hip TTP. Imaging negative for fractures/dislocations. Pt ambulatory in the ED in NAD and stating that she wants to go home. Pain has been adequately tx. Vital signs have been stable in the ED. Mildly HTN at discharge likely due to pt being up and ambulatory, and have not received PM dose of HTN meds.. Has HTN at baseline and will likely normalize when pt takes night BP meds at home. No CP or SOB throughout all of her stay in the ED. EKG at baseline for pt with no st elevations/depressions suggestive of ischemia. Pt is stable for d/c with outpatient f/u. Return precautions given.  ED Discharge Orders    None       Bishop Dublin 01/27/17 0133    Dorie Rank, MD 01/27/17 [phone]

## 2017-01-28 LAB — COMPLETE METABOLIC PANEL WITH GFR
AG RATIO: 1.7 (calc) (ref 1.0–2.5)
ALKALINE PHOSPHATASE (APISO): 112 U/L (ref 33–130)
ALT: 12 U/L (ref 6–29)
AST: 19 U/L (ref 10–35)
Albumin: 4.3 g/dL (ref 3.6–5.1)
BUN/Creatinine Ratio: 13 (calc) (ref 6–22)
BUN: 19 mg/dL (ref 7–25)
CO2: 31 mmol/L (ref 20–32)
Calcium: 8.8 mg/dL (ref 8.6–10.4)
Chloride: 101 mmol/L (ref 98–110)
Creat: 1.45 mg/dL — ABNORMAL HIGH (ref 0.60–0.93)
GFR, Est African American: 41 mL/min/{1.73_m2} — ABNORMAL LOW (ref >=60)
GFR, Est Non African American: 35 mL/min/{1.73_m2} — ABNORMAL LOW (ref >=60)
GLOBULIN: 2.6 g/dL (ref 1.9–3.7)
Glucose, Bld: 222 mg/dL — ABNORMAL HIGH (ref 65–99)
POTASSIUM: 4.5 mmol/L (ref 3.5–5.3)
SODIUM: 140 mmol/L (ref 135–146)
Total Bilirubin: 0.9 mg/dL (ref 0.2–1.2)
Total Protein: 6.9 g/dL (ref 6.1–8.1)

## 2017-01-28 LAB — PROTEIN,TOTAL AND ELECTROPHOR W/IFE
ALPHA 1: 0.3 g/dL (ref 0.2–0.3)
ALPHA 2: 0.8 g/dL (ref 0.5–0.9)
Abnormal Protein Band1: 0.3 g/dL — ABNORMAL HIGH
Albumin ELP: 4.1 g/dL (ref 3.8–4.8)
BETA 2: 0.4 g/dL (ref 0.2–0.5)
BETA GLOBULIN: 0.5 g/dL (ref 0.4–0.6)
GAMMA GLOBULIN: 1.1 g/dL (ref 0.8–1.7)
Immunofix Electr Int: DETECTED
TOTAL PROTEIN: 7.2 g/dL (ref 6.1–8.1)

## 2017-01-28 LAB — CBC WITH DIFFERENTIAL/PLATELET
Basophils Absolute: 38 {cells}/uL (ref 0–200)
Basophils Relative: 0.4 %
Eosinophils Absolute: 263 {cells}/uL (ref 15–500)
Eosinophils Relative: 2.8 %
HCT: 33.6 % — ABNORMAL LOW (ref 35.0–45.0)
Hemoglobin: 11.3 g/dL — ABNORMAL LOW (ref 11.7–15.5)
Lymphs Abs: 2519 {cells}/uL (ref 850–3900)
MCH: 29.1 pg (ref 27.0–33.0)
MCHC: 33.6 g/dL (ref 32.0–36.0)
MCV: 86.6 fL (ref 80.0–100.0)
MPV: 11.5 fL (ref 7.5–12.5)
Monocytes Relative: 7.2 %
Neutro Abs: 5903 {cells}/uL (ref 1500–7800)
Neutrophils Relative %: 62.8 %
Platelets: 210 Thousand/uL (ref 140–400)
RBC: 3.88 Million/uL (ref 3.80–5.10)
RDW: 13.5 % (ref 11.0–15.0)
Total Lymphocyte: 26.8 %
WBC mixed population: 677 {cells}/uL (ref 200–950)
WBC: 9.4 Thousand/uL (ref 3.8–10.8)

## 2017-01-28 LAB — VALPROIC ACID LEVEL: VALPROIC ACID LVL: 30.7 mg/L — AB (ref 50.0–100.0)

## 2017-01-28 LAB — URINALYSIS W MICROSCOPIC + REFLEX CULTURE
Bilirubin Urine: NEGATIVE
Hgb urine dipstick: NEGATIVE
Hyaline Cast: NONE SEEN /LPF
Nitrites, Initial: NEGATIVE
Protein, ur: NEGATIVE
Specific Gravity, Urine: 1.024 (ref 1.001–1.03)
pH: <=5 (ref 5.0–8.0)

## 2017-01-28 LAB — HEMOGLOBIN A1C
EAG (MMOL/L): 11.2 (calc)
HEMOGLOBIN A1C: 8.7 %{Hb} — AB (ref <5.7)
MEAN PLASMA GLUCOSE: 203 (calc)

## 2017-01-28 LAB — URINE CULTURE
MICRO NUMBER:: 90083321
SPECIMEN QUALITY:: ADEQUATE

## 2017-01-28 LAB — PROTEIN ELECTROPHORESIS,RANDOM URN
Albumin: 32 %
Alpha-1-Globulin, U: 5 %
Alpha-2-Globulin, U: 15 %
Beta Globulin, U: 25 %
Creatinine, Urine: 147 mg/dL (ref 20–275)
GAMMA GLOBULIN, U: 23 %
Protein/Creat Ratio: 88 mg/g creat (ref 21–161)
TOTAL PROTEIN, URINE: 13 mg/dL (ref 5–24)

## 2017-01-28 LAB — CULTURE INDICATED

## 2017-01-28 LAB — ANA,IFA RA DIAG PNL W/RFLX TIT/PATN
ANA: NEGATIVE
Rhuematoid fact SerPl-aCnc: <14 IU/mL (ref <14)

## 2017-01-28 LAB — C-REACTIVE PROTEIN: CRP: 4.1 mg/L (ref <8.0)

## 2017-01-29 ENCOUNTER — Emergency Department (HOSPITAL_COMMUNITY): Payer: Medicare Other

## 2017-01-29 ENCOUNTER — Telehealth: Payer: Self-pay | Admitting: Family Medicine

## 2017-01-29 ENCOUNTER — Inpatient Hospital Stay (HOSPITAL_COMMUNITY)
Admission: EM | Admit: 2017-01-29 | Discharge: 2017-02-03 | DRG: 689 | Disposition: A | Discharge status: Skilled Nursing Facility | Payer: Medicare Other | Attending: Internal Medicine | Admitting: Internal Medicine

## 2017-01-29 ENCOUNTER — Other Ambulatory Visit: Payer: Self-pay

## 2017-01-29 DIAGNOSIS — Z9981 Dependence on supplemental oxygen: Secondary | ICD-10-CM

## 2017-01-29 DIAGNOSIS — R918 Other nonspecific abnormal finding of lung field: Secondary | ICD-10-CM | POA: Diagnosis not present

## 2017-01-29 DIAGNOSIS — G9341 Metabolic encephalopathy: Secondary | ICD-10-CM | POA: Diagnosis present

## 2017-01-29 DIAGNOSIS — Z833 Family history of diabetes mellitus: Secondary | ICD-10-CM | POA: Diagnosis not present

## 2017-01-29 DIAGNOSIS — Z96652 Presence of left artificial knee joint: Secondary | ICD-10-CM | POA: Diagnosis present

## 2017-01-29 DIAGNOSIS — G2581 Restless legs syndrome: Secondary | ICD-10-CM | POA: Diagnosis present

## 2017-01-29 DIAGNOSIS — F419 Anxiety disorder, unspecified: Secondary | ICD-10-CM | POA: Diagnosis not present

## 2017-01-29 DIAGNOSIS — N390 Urinary tract infection, site not specified: Secondary | ICD-10-CM | POA: Diagnosis not present

## 2017-01-29 DIAGNOSIS — Z794 Long term (current) use of insulin: Secondary | ICD-10-CM

## 2017-01-29 DIAGNOSIS — R262 Difficulty in walking, not elsewhere classified: Secondary | ICD-10-CM | POA: Diagnosis not present

## 2017-01-29 DIAGNOSIS — Z7951 Long term (current) use of inhaled steroids: Secondary | ICD-10-CM

## 2017-01-29 DIAGNOSIS — I2510 Atherosclerotic heart disease of native coronary artery without angina pectoris: Secondary | ICD-10-CM | POA: Diagnosis present

## 2017-01-29 DIAGNOSIS — Z96643 Presence of artificial hip joint, bilateral: Secondary | ICD-10-CM | POA: Diagnosis present

## 2017-01-29 DIAGNOSIS — R2689 Other abnormalities of gait and mobility: Secondary | ICD-10-CM | POA: Diagnosis not present

## 2017-01-29 DIAGNOSIS — J449 Chronic obstructive pulmonary disease, unspecified: Secondary | ICD-10-CM | POA: Diagnosis not present

## 2017-01-29 DIAGNOSIS — F329 Major depressive disorder, single episode, unspecified: Secondary | ICD-10-CM | POA: Diagnosis not present

## 2017-01-29 DIAGNOSIS — K219 Gastro-esophageal reflux disease without esophagitis: Secondary | ICD-10-CM | POA: Diagnosis present

## 2017-01-29 DIAGNOSIS — I10 Essential (primary) hypertension: Secondary | ICD-10-CM | POA: Diagnosis not present

## 2017-01-29 DIAGNOSIS — E669 Obesity, unspecified: Secondary | ICD-10-CM | POA: Diagnosis not present

## 2017-01-29 DIAGNOSIS — Z79890 Hormone replacement therapy (postmenopausal): Secondary | ICD-10-CM

## 2017-01-29 DIAGNOSIS — I5032 Chronic diastolic (congestive) heart failure: Secondary | ICD-10-CM | POA: Diagnosis not present

## 2017-01-29 DIAGNOSIS — E785 Hyperlipidemia, unspecified: Secondary | ICD-10-CM | POA: Diagnosis present

## 2017-01-29 DIAGNOSIS — G9340 Encephalopathy, unspecified: Secondary | ICD-10-CM | POA: Diagnosis not present

## 2017-01-29 DIAGNOSIS — F418 Other specified anxiety disorders: Secondary | ICD-10-CM

## 2017-01-29 DIAGNOSIS — J432 Centrilobular emphysema: Secondary | ICD-10-CM | POA: Diagnosis not present

## 2017-01-29 DIAGNOSIS — K3184 Gastroparesis: Secondary | ICD-10-CM | POA: Diagnosis present

## 2017-01-29 DIAGNOSIS — E1143 Type 2 diabetes mellitus with diabetic autonomic (poly)neuropathy: Secondary | ICD-10-CM | POA: Diagnosis present

## 2017-01-29 DIAGNOSIS — F333 Major depressive disorder, recurrent, severe with psychotic symptoms: Secondary | ICD-10-CM | POA: Diagnosis not present

## 2017-01-29 DIAGNOSIS — Z90710 Acquired absence of both cervix and uterus: Secondary | ICD-10-CM

## 2017-01-29 DIAGNOSIS — Z7982 Long term (current) use of aspirin: Secondary | ICD-10-CM

## 2017-01-29 DIAGNOSIS — E782 Mixed hyperlipidemia: Secondary | ICD-10-CM | POA: Diagnosis not present

## 2017-01-29 DIAGNOSIS — E1169 Type 2 diabetes mellitus with other specified complication: Secondary | ICD-10-CM | POA: Diagnosis not present

## 2017-01-29 DIAGNOSIS — R488 Other symbolic dysfunctions: Secondary | ICD-10-CM | POA: Diagnosis not present

## 2017-01-29 DIAGNOSIS — R1314 Dysphagia, pharyngoesophageal phase: Secondary | ICD-10-CM | POA: Diagnosis not present

## 2017-01-29 DIAGNOSIS — Z853 Personal history of malignant neoplasm of breast: Secondary | ICD-10-CM | POA: Diagnosis not present

## 2017-01-29 DIAGNOSIS — Z8249 Family history of ischemic heart disease and other diseases of the circulatory system: Secondary | ICD-10-CM

## 2017-01-29 DIAGNOSIS — E7800 Pure hypercholesterolemia, unspecified: Secondary | ICD-10-CM | POA: Diagnosis present

## 2017-01-29 DIAGNOSIS — E119 Type 2 diabetes mellitus without complications: Secondary | ICD-10-CM | POA: Diagnosis not present

## 2017-01-29 DIAGNOSIS — J189 Pneumonia, unspecified organism: Secondary | ICD-10-CM | POA: Diagnosis not present

## 2017-01-29 DIAGNOSIS — Z9012 Acquired absence of left breast and nipple: Secondary | ICD-10-CM

## 2017-01-29 DIAGNOSIS — F5101 Primary insomnia: Secondary | ICD-10-CM | POA: Diagnosis not present

## 2017-01-29 DIAGNOSIS — R41841 Cognitive communication deficit: Secondary | ICD-10-CM | POA: Diagnosis not present

## 2017-01-29 DIAGNOSIS — E1140 Type 2 diabetes mellitus with diabetic neuropathy, unspecified: Secondary | ICD-10-CM | POA: Diagnosis present

## 2017-01-29 DIAGNOSIS — E1165 Type 2 diabetes mellitus with hyperglycemia: Secondary | ICD-10-CM | POA: Diagnosis present

## 2017-01-29 DIAGNOSIS — Z955 Presence of coronary angioplasty implant and graft: Secondary | ICD-10-CM

## 2017-01-29 DIAGNOSIS — R509 Fever, unspecified: Secondary | ICD-10-CM | POA: Diagnosis not present

## 2017-01-29 DIAGNOSIS — R450 Nervousness: Secondary | ICD-10-CM | POA: Diagnosis not present

## 2017-01-29 DIAGNOSIS — R5381 Other malaise: Secondary | ICD-10-CM | POA: Diagnosis not present

## 2017-01-29 DIAGNOSIS — Z87891 Personal history of nicotine dependence: Secondary | ICD-10-CM | POA: Diagnosis not present

## 2017-01-29 DIAGNOSIS — E890 Postprocedural hypothyroidism: Secondary | ICD-10-CM | POA: Diagnosis present

## 2017-01-29 DIAGNOSIS — Z810 Family history of intellectual disabilities: Secondary | ICD-10-CM | POA: Diagnosis not present

## 2017-01-29 DIAGNOSIS — I252 Old myocardial infarction: Secondary | ICD-10-CM

## 2017-01-29 DIAGNOSIS — I4891 Unspecified atrial fibrillation: Secondary | ICD-10-CM | POA: Diagnosis not present

## 2017-01-29 DIAGNOSIS — I509 Heart failure, unspecified: Secondary | ICD-10-CM | POA: Diagnosis not present

## 2017-01-29 DIAGNOSIS — E1151 Type 2 diabetes mellitus with diabetic peripheral angiopathy without gangrene: Secondary | ICD-10-CM | POA: Diagnosis present

## 2017-01-29 DIAGNOSIS — J9610 Chronic respiratory failure, unspecified whether with hypoxia or hypercapnia: Secondary | ICD-10-CM | POA: Diagnosis present

## 2017-01-29 DIAGNOSIS — R1312 Dysphagia, oropharyngeal phase: Secondary | ICD-10-CM | POA: Diagnosis not present

## 2017-01-29 DIAGNOSIS — M6281 Muscle weakness (generalized): Secondary | ICD-10-CM | POA: Diagnosis not present

## 2017-01-29 DIAGNOSIS — R4182 Altered mental status, unspecified: Secondary | ICD-10-CM | POA: Diagnosis not present

## 2017-01-29 DIAGNOSIS — E1159 Type 2 diabetes mellitus with other circulatory complications: Secondary | ICD-10-CM | POA: Diagnosis not present

## 2017-01-29 DIAGNOSIS — I110 Hypertensive heart disease with heart failure: Secondary | ICD-10-CM | POA: Diagnosis present

## 2017-01-29 DIAGNOSIS — Z79899 Other long term (current) drug therapy: Secondary | ICD-10-CM | POA: Diagnosis not present

## 2017-01-29 DIAGNOSIS — Z811 Family history of alcohol abuse and dependence: Secondary | ICD-10-CM | POA: Diagnosis not present

## 2017-01-29 DIAGNOSIS — I482 Chronic atrial fibrillation: Secondary | ICD-10-CM | POA: Diagnosis present

## 2017-01-29 DIAGNOSIS — J441 Chronic obstructive pulmonary disease with (acute) exacerbation: Secondary | ICD-10-CM | POA: Diagnosis not present

## 2017-01-29 DIAGNOSIS — F29 Unspecified psychosis not due to a substance or known physiological condition: Secondary | ICD-10-CM | POA: Diagnosis not present

## 2017-01-29 DIAGNOSIS — R402441 Other coma, without documented Glasgow coma scale score, or with partial score reported, in the field [EMT or ambulance]: Secondary | ICD-10-CM | POA: Diagnosis not present

## 2017-01-29 DIAGNOSIS — G40401 Other generalized epilepsy and epileptic syndromes, not intractable, with status epilepticus: Secondary | ICD-10-CM | POA: Diagnosis not present

## 2017-01-29 DIAGNOSIS — E1121 Type 2 diabetes mellitus with diabetic nephropathy: Secondary | ICD-10-CM | POA: Diagnosis present

## 2017-01-29 LAB — COMPREHENSIVE METABOLIC PANEL
ALBUMIN: 3.6 g/dL (ref 3.5–5.0)
ALK PHOS: 151 U/L — AB (ref 38–126)
ALT: 14 U/L (ref 14–54)
ANION GAP: 11 (ref 5–15)
AST: 25 U/L (ref 15–41)
BUN: 15 mg/dL (ref 6–20)
CALCIUM: 8.4 mg/dL — AB (ref 8.9–10.3)
CHLORIDE: 99 mmol/L — AB (ref 101–111)
CO2: 26 mmol/L (ref 22–32)
Creatinine, Ser: 1.16 mg/dL — ABNORMAL HIGH (ref 0.44–1.00)
GFR calc Af Amer: 52 mL/min — ABNORMAL LOW (ref >60)
GFR calc non Af Amer: 45 mL/min — ABNORMAL LOW (ref >60)
Glucose, Bld: 178 mg/dL — ABNORMAL HIGH (ref 65–99)
POTASSIUM: 3.6 mmol/L (ref 3.5–5.1)
SODIUM: 136 mmol/L (ref 135–145)
Total Bilirubin: 1.6 mg/dL — ABNORMAL HIGH (ref 0.3–1.2)
Total Protein: 7.4 g/dL (ref 6.5–8.1)

## 2017-01-29 LAB — CBC WITH DIFFERENTIAL/PLATELET
BASOS PCT: 0 %
Basophils Absolute: 0 10*3/uL (ref 0.0–0.1)
Eosinophils Absolute: 0.2 10*3/uL (ref 0.0–0.7)
Eosinophils Relative: 2 %
HEMATOCRIT: 33.5 % — AB (ref 36.0–46.0)
HEMOGLOBIN: 10.6 g/dL — AB (ref 12.0–15.0)
LYMPHS ABS: 2.1 10*3/uL (ref 0.7–4.0)
LYMPHS PCT: 15 %
MCH: 28.8 pg (ref 26.0–34.0)
MCHC: 31.6 g/dL (ref 30.0–36.0)
MCV: 91 fL (ref 78.0–100.0)
MONO ABS: 1 10*3/uL (ref 0.1–1.0)
MONOS PCT: 8 %
NEUTROS ABS: 10.1 10*3/uL — AB (ref 1.7–7.7)
NEUTROS PCT: 75 %
Platelets: 167 10*3/uL (ref 150–400)
RBC: 3.68 MIL/uL — ABNORMAL LOW (ref 3.87–5.11)
RDW: 14.7 % (ref 11.5–15.5)
WBC: 13.4 10*3/uL — ABNORMAL HIGH (ref 4.0–10.5)

## 2017-01-29 LAB — URINALYSIS, ROUTINE W REFLEX MICROSCOPIC
BILIRUBIN URINE: NEGATIVE
Glucose, UA: 50 mg/dL — AB
Hgb urine dipstick: NEGATIVE
KETONES UR: NEGATIVE mg/dL
NITRITE: NEGATIVE
Protein, ur: NEGATIVE mg/dL
SPECIFIC GRAVITY, URINE: 1.013 (ref 1.005–1.030)
pH: 6 (ref 5.0–8.0)

## 2017-01-29 LAB — I-STAT TROPONIN, ED: Troponin i, poc: 0.02 ng/mL (ref 0.00–0.08)

## 2017-01-29 LAB — ACETAMINOPHEN LEVEL: Acetaminophen (Tylenol), Serum: <10 ug/mL — ABNORMAL LOW (ref 10–30)

## 2017-01-29 LAB — RAPID URINE DRUG SCREEN, HOSP PERFORMED
Amphetamines: NOT DETECTED
BENZODIAZEPINES: NOT DETECTED
Barbiturates: NOT DETECTED
COCAINE: NOT DETECTED
Opiates: NOT DETECTED
Tetrahydrocannabinol: NOT DETECTED

## 2017-01-29 LAB — I-STAT CG4 LACTIC ACID, ED: LACTIC ACID, VENOUS: 1.25 mmol/L (ref 0.5–1.9)

## 2017-01-29 LAB — ETHANOL

## 2017-01-29 LAB — BRAIN NATRIURETIC PEPTIDE: B NATRIURETIC PEPTIDE 5: 128.5 pg/mL — AB (ref 0.0–100.0)

## 2017-01-29 LAB — SALICYLATE LEVEL: Salicylate Lvl: <7 mg/dL (ref 2.8–30.0)

## 2017-01-29 IMAGING — CT CT ANGIO NECK
2 of 7 series · 9 of 33 positions shown · IV contrast (APPLIED)
Comparison: CT 03/16/2015.  MRI 02/20/2015.

CLINICAL DATA: Right carotid endarterectomy 05/16/2015 there is
swelling of the right neck since surgery.

EXAM:
CT ANGIOGRAPHY NECK
TECHNIQUE: Multidetector CT imaging of the neck was performed using the
standard protocol during bolus administration of intravenous
contrast. Multiplanar CT image reconstructions and MIPs were
obtained to evaluate the vascular anatomy. Carotid stenosis
measurements (when applicable) are obtained utilizing NASCET
criteria, using the distal internal carotid diameter as the
denominator.
CONTRAST:  75 cc Isovue 300

[Series 4: cta neck · axial · 0.59mm/px · z∈[+364,+504]mm · 5 of 212 slices shown]
[im 36/212  soft-tissue]
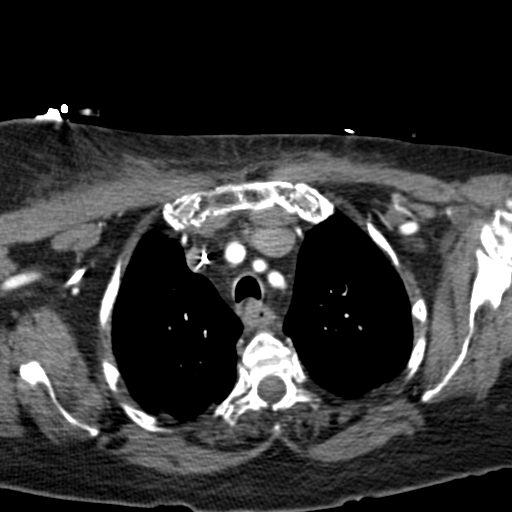
[im 71/212  bone]
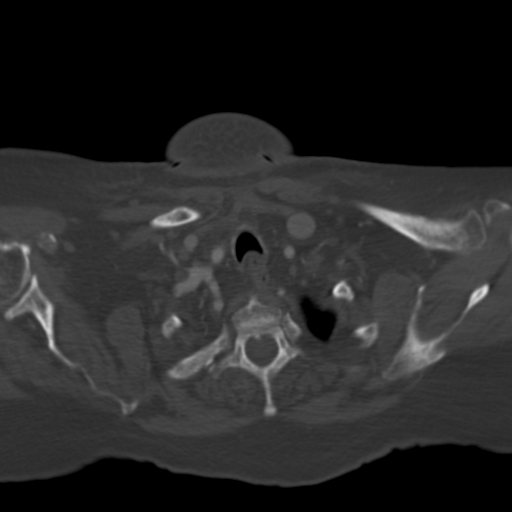
[im 106/212  soft-tissue]
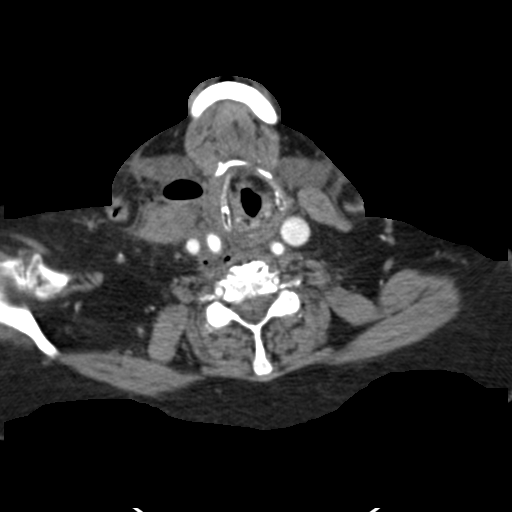
[im 141/212  bone]
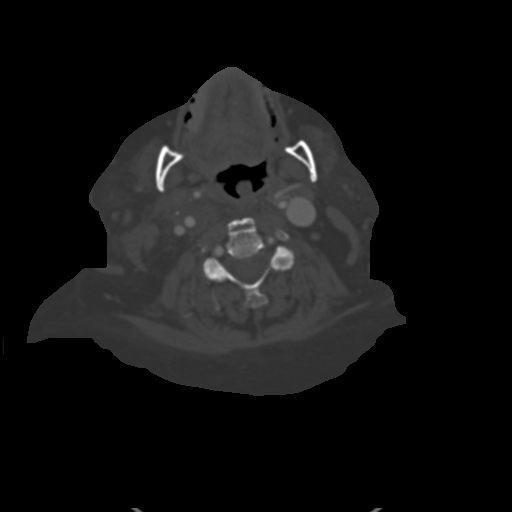
[im 176/212  soft-tissue]
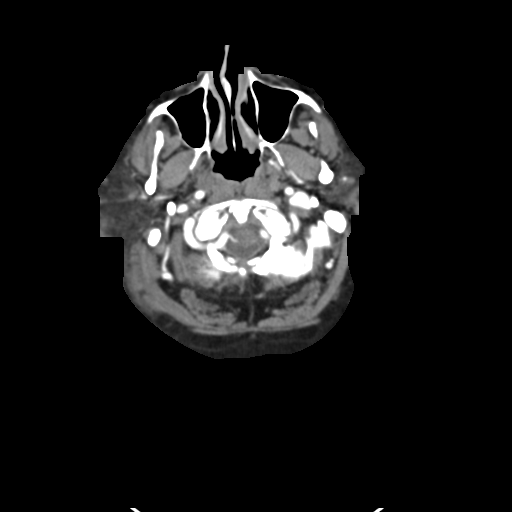

[Series 6: ax thin · axial · 0.55mm/px · z∈[+370,+496]mm · 4 of 210 slices shown]
[im 42/210  soft-tissue]
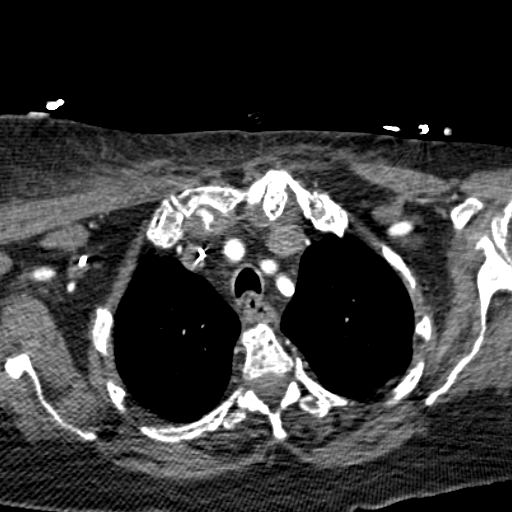
[im 84/210  soft-tissue]
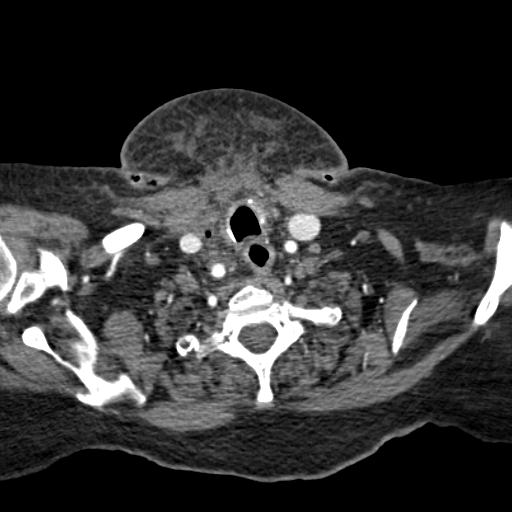
[im 126/210  soft-tissue]
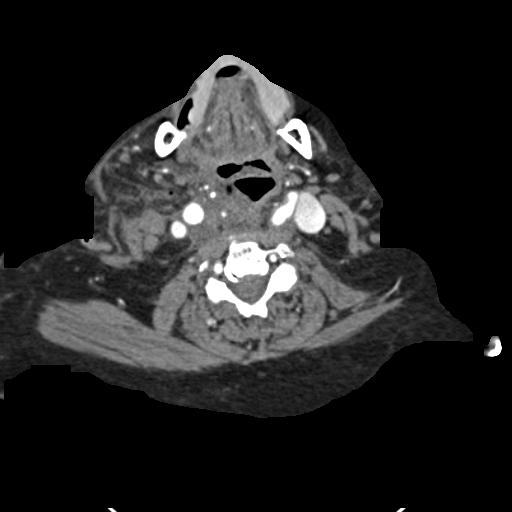
[im 168/210  soft-tissue]
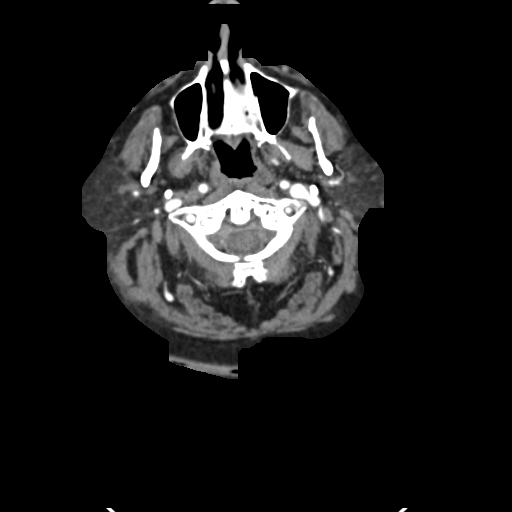

[9 of 33 positions shown; findings below may reference images not displayed]

FINDINGS: Aortic arch: Atherosclerotic change of the arch without aneurysm or
dissection. Branching pattern of brachiocephalic vessels from the
arch is normal. Atherosclerotic calcification at the brachiocephalic
vessel origins but without flow limiting stenosis. Estimated at 30%
stenoses of the left common carotid and left subclavian artery
origins.

Right carotid system: Common carotid artery is tortuous but widely
patent to the bifurcation region. There has been recent carotid
endarterectomy with wide patency at the bifurcation with respect to
the internal and external carotid arteries. No evidence of pseudo
aneurysm. There is some fluid and air along the operative approach
both superficial and deep to the vascular surgical region.
Considering surgery was 2 days ago, this is not unexpected. Surgical
clips are present in the operative head. The superficial collection
does measure up to 3 cm in size and could be responsible for the
right neck swelling. I do not see evidence that there was any injury
to the hypopharynx or esophagus on the basis of this study.

Left carotid system: Beyond the 30% origin stenosis common the
common carotid artery is widely patent. Wide patency of the carotid
bifurcation and of the cervical internal carotid artery.

Vertebral arteries:Stenosis of the right subclavian artery estimated
at 50% just proximal to the origin of the dominant right vertebral
artery. Wide patency of the vertebral artery origin. Dominant right
vertebral artery is widely patent through the cervical region in the
foramen magnum. Just distal to the foramen magnum, there is 50%
stenosis.

30% stenosis of the proximal left subclavian artery. More extensive
calcified plaque just proximal to the left vertebral artery origin
with stenosis of the subclavian estimated at 50%. 30-50% stenosis of
the non dominant left vertebral artery origin. Wide patency of the
vessel beyond that at the level of the foramen magnum, there is a
50% stenosis secondary to calcified plaque.

Skeleton: Ordinary spondylosis
IMPRESSION: Recent carotid endarterectomy on the right. Good appearance of the
vessel with wide patency and no evidence of pseudo aneurysm. Fluid
and air in the operative region, not unexpected 2 days following
procedure. Components present both deep and superficial to the
surgical region. Superficial component measures up to 3 cm and could
be associated with right neck swelling. I do not see evidence of an
airway or esophageal injury.

Unchanged since the previous study are 50% stenoses of the
subclavian arteries proximal to the vertebral artery origins. 30-50%
stenosis of the left vertebral artery origin. 50% stenosis of both
vertebral arteries just beyond the foramen magnum level.

## 2017-01-29 IMAGING — DX DG CHEST 1V PORT
1 series · 1 of 1 positions shown · non-contrast
Comparison: May 08, 2015

CLINICAL DATA: Central catheter placement

EXAM:
PORTABLE CHEST 1 VIEW

[chest ap]
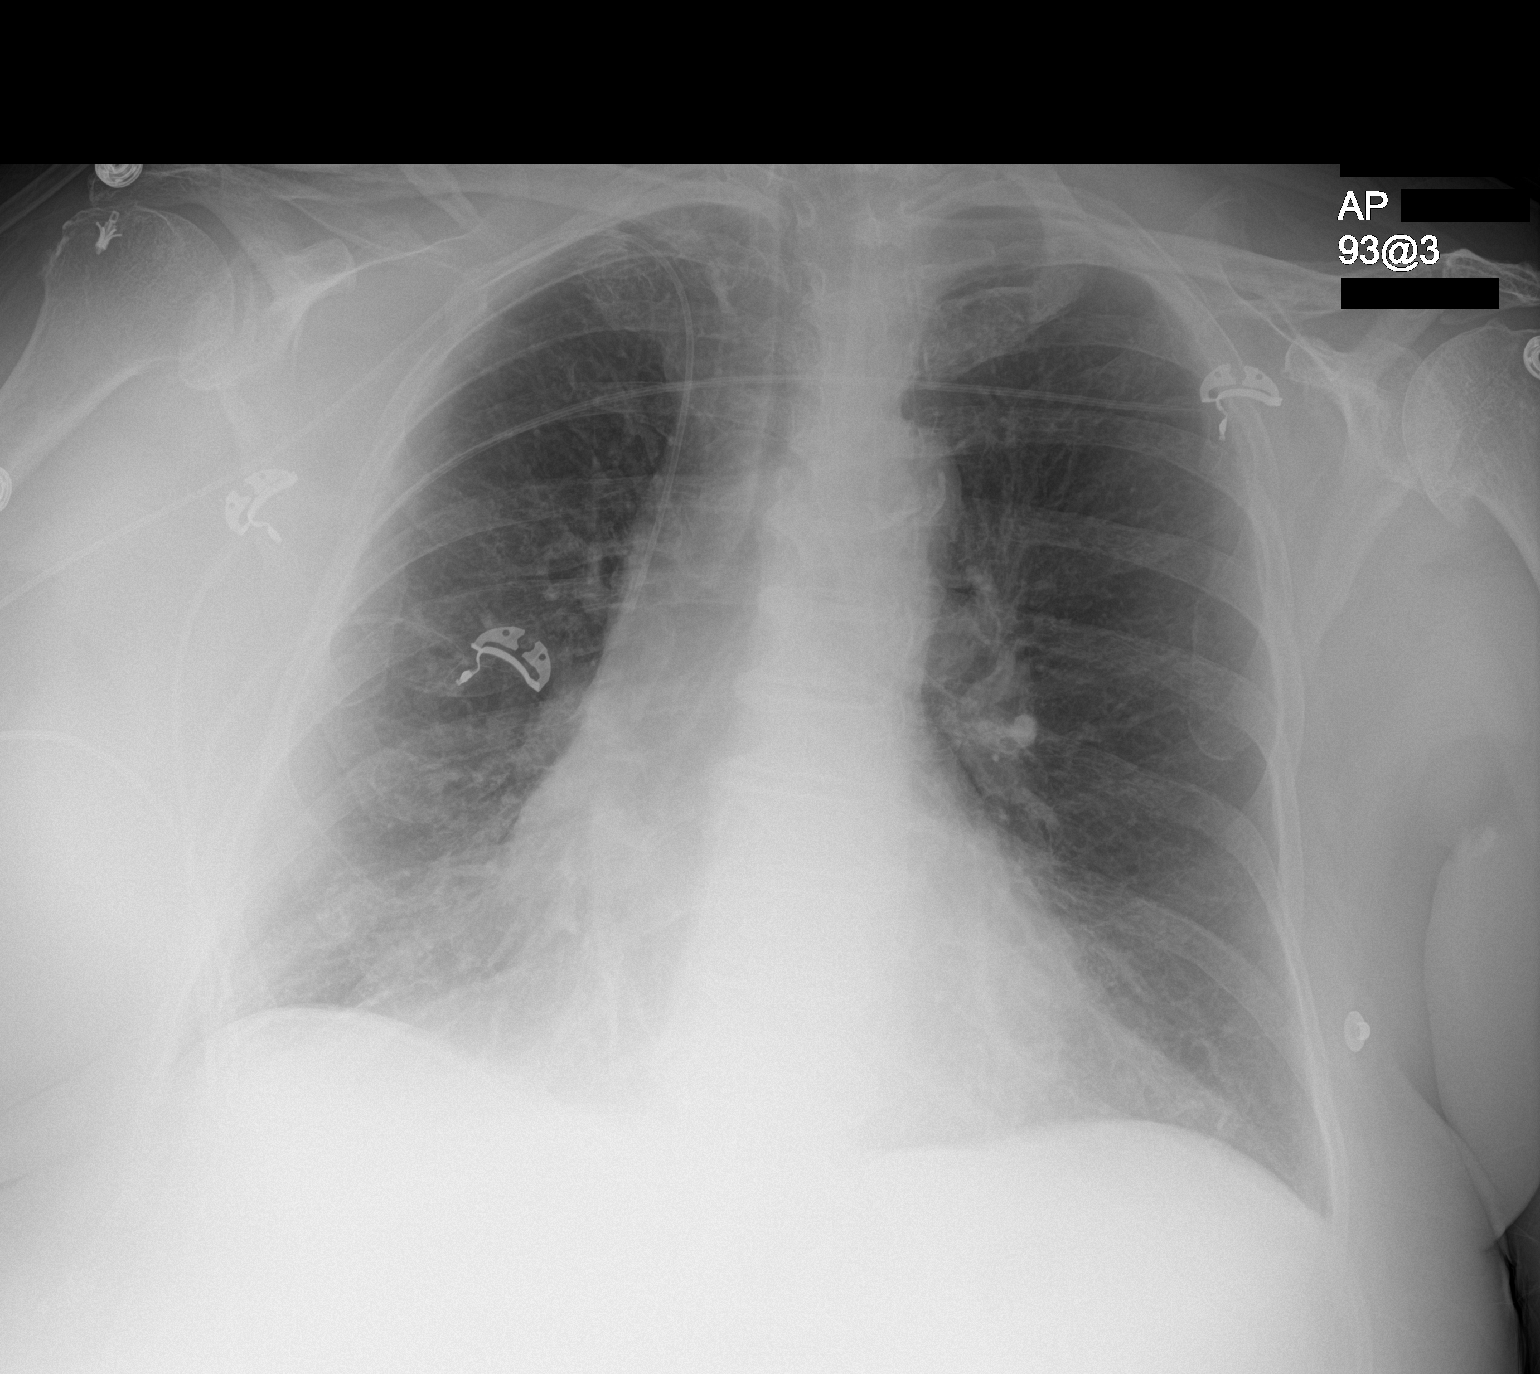

[1 of 1 positions shown; findings below may reference images not displayed]

FINDINGS: Central catheter tip is in the superior vena cava. No pneumothorax.
There is no edema or consolidation. Heart is upper normal in size
with pulmonary vascularity within normal limits. Prominence of the
epicardial fat on the right is stable. No adenopathy. There is
atherosclerotic calcification in the aorta. There is postoperative
change in the right shoulder region. There is degenerative change in
each shoulder.
IMPRESSION: Central catheter tip in superior vena cava. No pneumothorax. No
edema or consolidation. Stable cardiac silhouette.

## 2017-01-29 MED ORDER — ATORVASTATIN CALCIUM 80 MG PO TABS
80.0000 mg | ORAL_TABLET | Freq: Every day | ORAL | Status: DC
  Administered 2017-01-30 – 2017-02-02 (×4): 80 mg via ORAL
  Filled 2017-01-29 (×4): qty 1

## 2017-01-29 MED ORDER — NITROGLYCERIN 0.4 MG SL SUBL: 0.4000 mg | SUBLINGUAL_TABLET | SUBLINGUAL | Status: DC | PRN

## 2017-01-29 MED ORDER — PANTOPRAZOLE SODIUM 20 MG PO TBEC
20.0000 mg | DELAYED_RELEASE_TABLET | Freq: Two times a day (BID) | ORAL | Status: DC
  Administered 2017-01-30 – 2017-02-03 (×9): 20 mg via ORAL
  Filled 2017-01-29 (×9): qty 1

## 2017-01-29 MED ORDER — ASPIRIN EC 81 MG PO TBEC
81.0000 mg | DELAYED_RELEASE_TABLET | Freq: Every day | ORAL | Status: DC
  Administered 2017-01-30 – 2017-02-03 (×5): 81 mg via ORAL
  Filled 2017-01-29 (×5): qty 1

## 2017-01-29 MED ORDER — AMLODIPINE BESYLATE 5 MG PO TABS
5.0000 mg | ORAL_TABLET | Freq: Every day | ORAL | Status: DC
  Administered 2017-01-30 – 2017-02-03 (×5): 5 mg via ORAL
  Filled 2017-01-29 (×5): qty 1

## 2017-01-29 MED ORDER — CARVEDILOL 12.5 MG PO TABS
12.5000 mg | ORAL_TABLET | Freq: Two times a day (BID) | ORAL | Status: DC
  Administered 2017-01-30 – 2017-02-03 (×9): 12.5 mg via ORAL
  Filled 2017-01-29 (×9): qty 1

## 2017-01-29 MED ORDER — LEVOTHYROXINE SODIUM 112 MCG PO TABS
112.0000 ug | ORAL_TABLET | ORAL | Status: DC
  Filled 2017-01-29: qty 1

## 2017-01-29 MED ORDER — ARIPIPRAZOLE 2 MG PO TABS
2.0000 mg | ORAL_TABLET | Freq: Every day | ORAL | Status: DC
  Administered 2017-01-30 – 2017-02-01 (×3): 2 mg via ORAL
  Filled 2017-01-29 (×3): qty 1

## 2017-01-29 MED ORDER — ALBUTEROL SULFATE (2.5 MG/3ML) 0.083% IN NEBU: 0.6300 mg | INHALATION_SOLUTION | Freq: Four times a day (QID) | RESPIRATORY_TRACT | Status: DC | PRN

## 2017-01-29 MED ORDER — DIVALPROEX SODIUM ER 250 MG PO TB24
250.0000 mg | ORAL_TABLET | Freq: Every evening | ORAL | Status: DC
  Administered 2017-01-30 – 2017-01-31 (×2): 250 mg via ORAL
  Filled 2017-01-29 (×3): qty 1

## 2017-01-29 MED ORDER — FLUTICASONE PROPIONATE 50 MCG/ACT NA SUSP: 2.0000 | NASAL | Status: DC | PRN

## 2017-01-29 MED ORDER — IPRATROPIUM-ALBUTEROL 0.5-2.5 (3) MG/3ML IN SOLN
3.0000 mL | Freq: Once | RESPIRATORY_TRACT | Status: AC
  Administered 2017-01-29: 3 mL via RESPIRATORY_TRACT
  Filled 2017-01-29: qty 3

## 2017-01-29 MED ORDER — ACETAMINOPHEN 325 MG PO TABS
650.0000 mg | ORAL_TABLET | Freq: Once | ORAL | Status: AC
  Administered 2017-01-29: 650 mg via ORAL
  Filled 2017-01-29: qty 2

## 2017-01-29 MED ORDER — DULOXETINE HCL 60 MG PO CPEP
60.0000 mg | ORAL_CAPSULE | Freq: Every day | ORAL | Status: DC
  Administered 2017-01-30 – 2017-02-03 (×5): 60 mg via ORAL
  Filled 2017-01-29 (×5): qty 1

## 2017-01-29 MED ORDER — DEXTROSE 5 % IV SOLN
1.0000 g | Freq: Once | INTRAVENOUS | Status: AC
  Administered 2017-01-30: 1 g via INTRAVENOUS
  Filled 2017-01-29: qty 10

## 2017-01-29 MED ORDER — SPIRONOLACTONE 25 MG PO TABS
25.0000 mg | ORAL_TABLET | Freq: Every day | ORAL | Status: DC
  Administered 2017-01-30 – 2017-02-03 (×5): 25 mg via ORAL
  Filled 2017-01-29 (×5): qty 1

## 2017-01-29 MED ORDER — DEXTROSE 5 % IV SOLN
1.0000 g | INTRAVENOUS | Status: DC
  Administered 2017-01-30 – 2017-02-02 (×4): 1 g via INTRAVENOUS
  Filled 2017-01-29 (×4): qty 10

## 2017-01-29 MED ORDER — ROPINIROLE HCL 1 MG PO TABS
1.0000 mg | ORAL_TABLET | Freq: Three times a day (TID) | ORAL | Status: DC
  Administered 2017-01-30 – 2017-02-03 (×13): 1 mg via ORAL
  Filled 2017-01-29 (×13): qty 1

## 2017-01-29 MED ORDER — LEVOTHYROXINE SODIUM 100 MCG PO TABS
100.0000 ug | ORAL_TABLET | ORAL | Status: DC
  Administered 2017-02-02 – 2017-02-03 (×2): 100 ug via ORAL
  Filled 2017-01-29: qty 1

## 2017-01-29 NOTE — Telephone Encounter (Signed)
I have sent multiple notes to the patient through [facility]. Please see those. You are welcome to read them to her or mail them to her if they are not accessing MyChart.

## 2017-01-29 NOTE — Progress Notes (Signed)
Pharmacy Antibiotic Note  Bonnie Henry is a 77 y.o. female admitted on 01/29/2017 with UTI.  Pharmacy has been consulted for Rocephin dosing.  Plan: Rocephin 1g IV Q24H. Pharmacy will sign off.  Height: 5\' 3"  (160 cm) Weight: 200 lb (90.7 kg) IBW/kg (Calculated) : 52.4  Temp (24hrs), Avg:100.1 F (37.8 C), Min:99 F (37.2 C), Max:101.1 F (38.4 C)  Recent Labs  Lab 01/23/17 1233 01/29/17 1823 01/29/17 1841  WBC 9.4 13.4*  --   CREATININE 1.45* 1.16*  --   LATICACIDVEN  --   --  1.25    Estimated Creatinine Clearance: 45.5 mL/min (A) (by C-G formula based on SCr of 1.16 mg/dL (H)).    Allergies  Allergen Reactions  . Vasotec [Enalapril] Swelling    Throat swells  . Risperidone Rash  . Codeine Nausea And Vomiting  . Morphine And Related Nausea And Vomiting    Thank you for allowing pharmacy to be a part of this patient's care.  Wynona Neat, PharmD, BCPS  01/29/2017 11:18 PM

## 2017-01-29 NOTE — Telephone Encounter (Signed)
Please advise on lab results.

## 2017-01-29 NOTE — ED Notes (Addendum)
Pt found looking through the cabinet. Pt had pulled off all wires and monitors. Pt is now in bed on monitor

## 2017-01-29 NOTE — ED Provider Notes (Signed)
[facility] EMERGENCY DEPARTMENT Provider Note   CSN: [id number] Arrival date & time: 01/29/17  1723     History   Chief Complaint Chief Complaint  Patient presents with  . Altered Mental Status   Level V caveat due to altered mental status. History provided by patient, triage note, chart review and daughter at bedside. HPI Bonnie Henry is a 77 y.o. female with history of COPD, hypertension, depression, diabetes on insulin here for evaluation of altered mental status. Patient states that "today was supposed to be the day I die", states that she switched places with another person so she wouldn't die, now she is upset.  She wants to talk to the [facility] so that she can be the one that dies instead. States she lives with her mom. She denies any pain. She denis SI, HI.   Daughter at bedside states pt has been more confused, "in and out" since today. Was diagnosed with UTI four days ago but thinks it is getting worse. Pt did not want to get out of bed today and her walk was less steady. Daughter has noticed increased cough with increased productive cough of yellow sputum x 2 days. No vomiting, diarrhea, head trauma. [place] two days ago, no head trauma and cleared in ED.   HPI  Past Medical History:  Diagnosis Date  . Anemia   . Anxiety   . Aortic atherosclerosis ([place]) 12/09/2016   Chest CT Dec 2018  . Arthritis    "joints ache all over" . osteoarthritis  . Asthma   . Atrial fibrillation ([place])   . Breast cancer, left breast (HCC)    S/P mastectomy  . CAD (coronary artery disease)   . CHF (congestive heart failure) ([place])   . Chronic back pain   . Constipation 03/04/2015  . COPD (chronic obstructive pulmonary disease) ([place])   . Depression, major   . Diabetes mellitus without complication ([place])   . DVT (deep venous thrombosis) ([place])   . GERD (gastroesophageal reflux disease)   . Headache   . Heart murmur   . Hepatitis    HEP "C"  . History of blood  transfusion    "when I had my mastectomy"  . History of hiatal hernia   . History of right-sided carotid endarterectomy 06/10/2015  . Hypercholesteremia   . Hypertension   . Morbid obesity ([place])   . Myocardial infarction ([place])   . Neuropathy   . Peripheral vascular disease ([place])   . Post-surgical hypothyroidism   . Restless leg syndrome   . Shortness of breath dyspnea   . Sleep apnea    "2015 wore mask; lost weight; told didn't need mask anymore" (02/23/2014)  . Type II diabetes mellitus [facility])     Patient Active Problem List   Diagnosis Date Noted  . Abnormality of plasma protein 01/23/2017  . Dependent edema 12/22/2016  . Severe episode of recurrent major depressive disorder, with psychotic features ([place]) 12/11/2016  . Depression, major, recurrent, severe with psychosis ([place]) 12/09/2016  . Mild dementia 12/09/2016  . Aortic atherosclerosis ([place]) 12/09/2016  . Facet arthropathy, lumbar 11/21/2016  . Hyperlipidemia due to type 2 diabetes mellitus ([place]) 11/03/2016  . Cognitive impairment 08/26/2016  . Multifactorial gait disorder 08/26/2016  . Diabetic polyneuropathy associated with diabetes mellitus due to underlying condition ([place]) 08/26/2016  . Osteoarthritis of right knee 07/23/2016  . Graves' disease with exophthalmos 05/29/2016  . Medication monitoring encounter 05/26/2016  . Renal insufficiency 08/03/2015  . Anemia, iron  deficiency 07/22/2015  . Metabolic encephalopathy 40/10/2723  . Chronic respiratory failure with hypoxia ([place]) 07/21/2015  . History of right-sided carotid endarterectomy 06/10/2015  . Carotid stenosis, right 05/16/2015  . Low back pain 05/04/2015  . Chronic nonintractable headache 03/30/2015  . Memory loss, short term 03/30/2015  . Patellar subluxation 03/29/2015  . Constipation 03/04/2015  . Lumbar stenosis with neurogenic claudication 02/23/2015  . DDD (degenerative disc disease), lumbar 02/23/2015  . Cervical radiculitis 02/23/2015  . Encounter  for medication monitoring 02/20/2015  . Knee pain, bilateral 02/08/2015  . Frequent falls 01/22/2015  . 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 Chronic interstitial lung disease ([place]) 10/26/2014  . Centrilobular emphysema ([place]) 10/26/2014  . Vision blurred 09/28/2014  . Acquired exophthalmos 09/28/2014  . Diabetes mellitus with diabetic neuropathy ([place]) 08/31/2014  . [place] 08/31/2014  . Foot callus 08/31/2014  . 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 Anemia 05/21/2014  . Chronic pain 03/02/2014  . Depression 03/02/2014  . Acid reflux 03/02/2014  . Glaucoma 03/02/2014  . Adult hypothyroidism 03/02/2014  . Apnea, sleep 03/02/2014  . PAT (paroxysmal atrial tachycardia) ([place]) 02/24/2014  . Unstable angina ([place]) 02/23/2014  . Type 2 diabetes with nephropathy ([place]) 02/23/2014  . HTN (hypertension) 02/23/2014  . CAD S/P remote PCI  02/23/2014  . High cholesterol 02/23/2014  . PVD- h/o LCEA 02/23/2014  . Morbid obesity ([place]) 02/23/2014  . Aortic stenosis murmur 02/23/2014  . COPD, moderate ([place]) 07/27/2013    Past Surgical History:  Procedure Laterality Date  . ARTERY BIOPSY Right 09/29/2014   Procedure: BIOPSY TEMPORAL ARTERY;  Surgeon: Angelia Mould, MD;  Location: [facility];  Service: Vascular;  Laterality: Right;  . BARTHOLIN CYST MARSUPIALIZATION    . BREAST BIOPSY Left    Mastectomy  . CARDIAC CATHETERIZATION  2000's  . CAROTID ENDARTERECTOMY Left 2009  . CATARACT EXTRACTION W/ INTRAOCULAR LENS  IMPLANT, BILATERAL Bilateral ~ 2011  . CORONARY ANGIOPLASTY WITH STENT PLACEMENT  1990's X 2   "2; 1"  . ENDARTERECTOMY Right 05/16/2015   Procedure: ENDARTERECTOMY CAROTID;  Surgeon: Katha Cabal, MD;  Location: [facility];  Service: Vascular;  Laterality: Right;  . ESOPHAGOGASTRODUODENOSCOPY (EGD) WITH PROPOFOL N/A 12/12/2014   Procedure: ESOPHAGOGASTRODUODENOSCOPY (EGD) WITH PROPOFOL;  Surgeon: Lucilla Lame, MD;  Location: [facility];  Service: Endoscopy;  Laterality:  N/A;  . EYE SURGERY Bilateral    Catract Extraction with IOL  . HAMMER TOE SURGERY Left    2nd and 3rd digits  . JOINT REPLACEMENT Bilateral    Total Hip Replacement  . JOINT REPLACEMENT Left    Total Knee Replacement  . LEFT HEART CATHETERIZATION WITH CORONARY ANGIOGRAM N/A 02/24/2014   Procedure: LEFT HEART CATHETERIZATION WITH CORONARY ANGIOGRAM;  Surgeon: Leonie Man, MD;  Location: [facility] CATH LAB;  Service: Cardiovascular;  Laterality: N/A;  . MASTECTOMY Left 1990's  . SHOULDER SURGERY Right    "cleaned it out; no scope"  . THYROIDECTOMY, PARTIAL  1950's  . TOE SURGERY Right    "cut piece of bone out so toe didn't dig into my foot"  . TONSILLECTOMY  1950's  . TOTAL HIP ARTHROPLASTY Bilateral 1990's  . TOTAL KNEE ARTHROPLASTY Left 1990's  . TRIGGER FINGER RELEASE Left   . TUBAL LIGATION    . VAGINAL HYSTERECTOMY     "partial"    OB History    Gravida Para Term Preterm AB Living   0 0 0 0 0     SAB TAB Ectopic Multiple Live Births  0 0 0           Home Medications    Prior to Admission medications   Medication Sig Start Date End Date Taking? Authorizing Provider  acetaminophen (TYLENOL) 500 MG tablet Take 2 tablets (1,000 mg total) by mouth every 6 (six) hours as needed for moderate pain. 05/08/15   Duffy Bruce, MD  albuterol (ACCUNEB) 0.63 MG/3ML nebulizer solution Take 1 ampule by nebulization every 6 (six) hours as needed for wheezing or shortness of breath.     [provider]  albuterol (PROAIR HFA) 108 (90 BASE) MCG/ACT inhaler Inhale 2 puffs into the lungs every 4 (four) hours as needed for wheezing or shortness of breath.     [provider]  amLODipine (NORVASC) 5 MG tablet Take 5 mg by mouth daily.  12/22/16   [provider]  ARIPiprazole (ABILIFY) 2 MG tablet Take 2 mg by mouth daily.  01/16/17   [provider]  Ascorbic Acid (VITAMIN C PO) Take 1 tablet by mouth 3 (three) times a week. MWF    [provider]    aspirin EC 81 MG tablet Take 81 mg by mouth daily.    [provider]  atorvastatin (LIPITOR) 80 MG tablet Take 1 tablet (80 mg total) by mouth at bedtime. 08/07/16   Lada, Satira Anis, MD  budesonide (PULMICORT) 0.5 MG/2ML nebulizer solution Take 0.5 mg by nebulization 2 (two) times daily.     [provider]  bumetanide (BUMEX) 0.5 MG tablet Take 0.5 mg by mouth daily as needed (fluid).     [provider]  busPIRone (BUSPAR) 15 MG tablet TAKE ONE TABLET BY MOUTH TWICE DAILY 12/04/16   Arnetha Courser, MD  carvedilol (COREG) 12.5 MG tablet Take 12.5 mg by mouth 2 (two) times daily with a meal.  01/24/14   [provider]  divalproex (DEPAKOTE ER) 250 MG 24 hr tablet Take 250 mg by mouth every evening.  01/16/17   [provider]  doxycycline (VIBRA-TABS) 100 MG tablet Take 1 tablet (100 mg total) by mouth 2 (two) times daily for 7 days. 01/24/17 01/31/17  Arnetha Courser, MD  DULoxetine (CYMBALTA) 60 MG capsule Take 1 capsule (60 mg total) by mouth daily. 08/22/16   Arnetha Courser, MD  fluticasone (FLONASE) 50 MCG/ACT nasal spray Place 2 sprays into both nostrils as needed for allergies.  10/16/14   [provider]  formoterol (PERFOROMIST) 20 MCG/2ML nebulizer solution Inhale 20 mcg into the lungs 2 (two) times daily.     [provider]  furosemide (LASIX) 40 MG tablet Take by mouth. 12/23/16   [provider]  gabapentin (NEURONTIN) 600 MG tablet Take 1 tablet (600 mg total) 2 (two) times daily by mouth. 11/21/16   Lada, Satira Anis, MD  Hypromellose (ARTIFICIAL TEARS OP) Place 1 drop into both eyes as needed (dry eyes).     [provider]  insulin NPH Human (HUMULIN N,NOVOLIN N) 100 UNIT/ML injection Inject 24 Units into the skin at bedtime.  06/14/15 01/13/17  [provider]  insulin regular (NOVOLIN R,HUMULIN R) 100 units/mL injection Inject 28 Units 3 (three) times daily before meals into the skin. Based on  sliding scale     [provider]  levothyroxine (SYNTHROID, LEVOTHROID) 100 MCG tablet Take 1 tablet (100 mcg total) by mouth every other day. (alternate with the 112 mcg strength) 09/08/14   Lada, Satira Anis, MD  levothyroxine (SYNTHROID, LEVOTHROID) 112 MCG tablet  Take 1 tablet (112 mcg total) by mouth every other day. (alternate with the 100 mcg strength) Patient taking differently: Take 112 mcg by mouth See admin instructions. Take 1 tablet (112 mcg) by mouth before breakfast on Friday, Saturday, Sunday (take 100 mcg other days) 09/08/14   Arnetha Courser, MD  meclizine (ANTIVERT) 25 MG tablet Take 0.5-1 tablets (12.5-25 mg total) by mouth 3 (three) times daily as needed for dizziness. 05/29/16   Arnetha Courser, MD  montelukast (SINGULAIR) 10 MG tablet TAKE ONE TABLET BY MOUTH ONCE DAILY 01/07/17   Lada, Satira Anis, MD  Multiple Vitamin (THERA) TABS Take by mouth daily.  12/23/16   [provider]  nitroGLYCERIN (NITROSTAT) 0.4 MG SL tablet Place 1 tablet (0.4 mg total) every 5 (five) minutes as needed under the tongue for chest pain. Max of 3 pills total, call 911 Patient not taking: Reported on 01/23/2017 11/21/16   Arnetha Courser, MD  Omega-3 Fatty Acids (FISH OIL) 1200 MG CAPS Take 1 capsule by mouth 2 (two) times a week.     [provider]  oxyCODONE-acetaminophen (ROXICET) 5-325 MG tablet Take 0.5-1 tablets by mouth every 6 (six) hours as needed for moderate pain or severe pain. 01/23/17   Arnetha Courser, MD  OXYGEN Inhale 2 L into the lungs continuous.    [provider]  pantoprazole (PROTONIX) 20 MG tablet TAKE ONE TABLET BY MOUTH TWICE DAILY 06/03/16   Lada, Satira Anis, MD  ranitidine (ZANTAC) 300 MG tablet Take 1 tablet (300 mg total) by mouth at bedtime. 01/15/16   Lada, Satira Anis, MD  rOPINIRole (REQUIP) 1 MG tablet TAKE 1 TABLET BY MOUTH THREE TIMES DAILY 11/13/16   Arnetha Courser, MD  spironolactone (ALDACTONE) 25 MG tablet Take 25 mg by mouth daily.  08/27/14   [provider]  tiZANidine (ZANAFLEX) 2 MG tablet TAKE 1 TABLET BY MOUTH AT BEDTIME Patient not taking: Reported on 01/26/2017 09/17/16   Alda Berthold, DO    Family History Family History  Problem Relation Age of Onset  . Arthritis Mother   . Cancer Mother   . Diabetes Mother   . Heart disease Mother   . Hyperlipidemia Mother   . Hypertension Mother   . Alcohol abuse Father   . Brain cancer Father   . Arthritis Maternal Grandmother   . Alzheimer's disease Maternal Grandmother   . Diabetes Daughter   . Hyperlipidemia Daughter   . Hypertension Daughter   . Thyroid cancer Daughter   . Diabetes Son   . Diabetes Daughter   . Hypertension Daughter   . Diabetes Son   . Hypertension Son   . Hyperlipidemia Son   . Thyroid cancer Son   . Diabetes Brother   . Diabetes Brother     Social History Social History   Tobacco Use  . Smoking status: Former Smoker    Packs/day: 1.50    Years: 44.00    Pack years: 66.00    Types: Cigarettes    Last attempt to quit: 01/06/1994    Years since quitting: 23.0  . Smokeless tobacco: Never Used  . Tobacco comment: "quit smoking cigarettes in ~ 2000"  Substance Use Topics  . Alcohol use: No    Alcohol/week: 0.0 oz    Comment: 02/23/2014 "no alcohol since 1990's"  . Drug use: No     Allergies   Vasotec [enalapril]; Risperidone; Codeine; and Morphine and related   Review of Systems Review of Systems  Unable to perform ROS: Mental status change  Respiratory: Positive for cough (with sputum).   Psychiatric/Behavioral: Positive for confusion and hallucinations.  All other systems reviewed and are negative.    Physical Exam Updated Vital Signs BP 118/62   Pulse 72   Temp (!) 101.1 F (38.4 C) (Rectal)   Resp (!) 26   Ht 5\' 3"  (1.6 m)   Wt 90.7 kg (200 lb)   SpO2 100%   BMI 35.43 kg/m   Physical Exam  Constitutional: She is oriented to person, place, and time. She appears well-developed and  well-nourished. No distress.  Alert and oriented to self, place but disoriented to year and events leading up to ED visit. Teary-eyed, crying.  HENT:  Head: Normocephalic and atraumatic.  Nose: Nose normal.  Mouth/Throat: No oropharyngeal exudate.  Moist because membranes.  Eyes: Conjunctivae and EOM are normal. Pupils are equal, round, and reactive to light.  Neck: Normal range of motion. Neck supple.  Cardiovascular: Normal rate, regular rhythm, normal heart sounds and intact distal pulses.  No murmur heard. No tachycardia or hypotension. 2+ distal pulses bilaterally. No lower extremity edema.  Pulmonary/Chest: Effort normal. She has wheezes.  Bilateral lower lobe expiratory wheezing. No crackles.  Abdominal: Soft. Bowel sounds are normal. She exhibits no distension. There is no tenderness.  No suprapubic or CVA tenderness.  Musculoskeletal: Normal range of motion. She exhibits no deformity.  Lymphadenopathy:    She has no cervical adenopathy.  Neurological: She is alert and oriented to person, place, and time. No sensory deficit.  Speech is fluent without aphasia. Strength 5/5 with hand grip and ankle F/E.   Sensation to light touch intact in hands and feet. Slow but steady gait with no assist. No pronator drift.  Normal finger-to-nose.  CN I and VIII not tested. CN II-XII intact bilaterally.   Skin: Skin is warm and dry. Capillary refill takes less than 2 seconds.  Psychiatric: Her speech is normal. Her mood appears anxious. Her affect is labile. She is agitated and actively hallucinating. Thought content is paranoid and delusional. She expresses impulsivity and inappropriate judgment. She expresses no homicidal and no suicidal ideation. She expresses no suicidal plans and no homicidal plans.  Nursing note and vitals reviewed.    ED Treatments / Results  Labs (all labs ordered are listed, but only abnormal results are displayed) Labs Reviewed  COMPREHENSIVE METABOLIC PANEL -  Abnormal; Notable for the following components:      Result Value   Chloride 99 (*)    Glucose, Bld 178 (*)    Creatinine, Ser 1.16 (*)    Calcium 8.4 (*)    Alkaline Phosphatase 151 (*)    Total Bilirubin 1.6 (*)    GFR calc non Af Amer 45 (*)    GFR calc Af Amer 52 (*)    All other components within normal limits  CBC WITH DIFFERENTIAL/PLATELET - Abnormal; Notable for the following components:   WBC 13.4 (*)    RBC 3.68 (*)    Hemoglobin 10.6 (*)    HCT 33.5 (*)    Neutro Abs 10.1 (*)    All other components within normal limits  URINALYSIS, ROUTINE W REFLEX MICROSCOPIC - Abnormal; Notable for the following components:   Glucose, UA 50 (*)    Leukocytes, UA TRACE (*)    Bacteria, UA RARE (*)    Squamous Epithelial / LPF 0-5 (*)    All other components within normal limits  BRAIN NATRIURETIC PEPTIDE - Abnormal;  Notable for the following components:   B Natriuretic Peptide 128.5 (*)    All other components within normal limits  ACETAMINOPHEN LEVEL - Abnormal; Notable for the following components:   Acetaminophen (Tylenol), Serum <10 (*)    All other components within normal limits  URINE CULTURE  SALICYLATE LEVEL  ETHANOL  RAPID URINE DRUG SCREEN, HOSP PERFORMED  INFLUENZA PANEL BY PCR (TYPE A & B)  I-STAT CG4 LACTIC ACID, ED  I-STAT TROPONIN, ED  I-STAT CG4 LACTIC ACID, ED    EKG  EKG Interpretation  Date/Time:  Thursday January 29 2017 17:34:52 EST Ventricular Rate:  76 PR Interval:    QRS Duration: 101 QT Interval:  400 QTC Calculation: 450 R Axis:   -4 Text Interpretation:  Sinus rhythm Low voltage, precordial leads no acute change from previous Confirmed by Charlesetta Shanks [phone]) on 01/29/2017 10:47:40 PM       Radiology Dg Chest 2 View  Result Date: 01/29/2017 CLINICAL DATA:  Altered mental status EXAM: CHEST  2 VIEW COMPARISON:  01/26/2017, 12/09/2016, CT chest 12/08/2016 FINDINGS: No significant pleural effusion. Mild cardiomegaly with aortic  atherosclerosis. No acute consolidation. Patchy opacity at the right middle lobe is similar compared to prior. No pneumothorax. Degenerative changes of the spine. Surgical changes at the right humeral head. IMPRESSION: No active cardiopulmonary disease.  Stable cardiomegaly Electronically Signed   By: Donavan Foil M.D.   On: 01/29/2017 19:52   Ct Head Wo Contrast  Result Date: 01/29/2017 CLINICAL DATA:  Recent urinary tract infection. Altered mental status with confusion. No reported injury. EXAM: CT HEAD WITHOUT CONTRAST TECHNIQUE: Contiguous axial images were obtained from the base of the skull through the vertex without intravenous contrast. COMPARISON:  09/01/2013 head CT. FINDINGS: Brain: No evidence of parenchymal hemorrhage or extra-axial fluid collection. No mass lesion, mass effect, or midline shift. No CT evidence of acute infarction. Generalized cerebral volume loss. Nonspecific prominent subcortical and periventricular white matter hypodensity, most in keeping with chronic small vessel ischemic change. Cerebral ventricle sizes are stable and concordant with the degree of cerebral volume loss. Vascular: No acute abnormality. Skull: No evidence of calvarial fracture. Sinuses/Orbits: The visualized paranasal sinuses are essentially clear. Stable bilateral proptosis. Other: Mild partial left mastoid effusion. Clear right mastoid air cells. IMPRESSION: 1.  No evidence of acute intracranial abnormality. 2. Generalized cerebral volume loss and prominent chronic small vessel ischemic changes in the cerebral white matter. 3. Nonspecific mild partial left mastoid effusion. Electronically Signed   By: Ilona Sorrel M.D.   On: 01/29/2017 19:26    Procedures Procedures (including critical care time)  Medications Ordered in ED Medications  acetaminophen (TYLENOL) tablet 650 mg (650 mg Oral Given 01/29/17 2051)  ipratropium-albuterol (DUONEB) 0.5-2.5 (3) MG/3ML nebulizer solution 3 mL (3 mLs Nebulization  Given 01/29/17 2051)     Initial Impression / Assessment and Plan / ED Course  I have reviewed the triage vital signs and the nursing notes.  Pertinent labs & imaging results that were available during my care of the patient were reviewed by me and considered in my medical decision making (see chart for details).  Clinical Course as of Jan 30 2251  Thu Jan 29, 2017  2034 Temp: (!) 101.1 F (38.4 C) [CG]  2034 WBC: (!) 13.4 [CG]  2034 Hemoglobin: (!) 10.6 [CG]  2034 B Natriuretic Peptide: (!) 128.5 [CG]  2034 Leukocytes, UA: (!) TRACE [CG]  2034 Bacteria, UA: (!) RARE [CG]  2034 Squamous Epithelial / LPF: (!) 0-5 [  CG]  2034 Creatinine: (!) 1.16 [CG]  2034 GFR, Est Non African American: (!) 45 [CG]    Clinical Course User Index [CG] Kinnie Feil, PA-C   77 year old here for altered mental status. Diagnosed with UTI 4 days ago. Daughter reports increased cough with yellow sputum x 2 days.   On exam, she is has a rectal temperature. Delusional and hallucinating, but denies frank suicidal ideations, homicidal ideations. Faint expiratory wheezing noted. VS WNL. No hypoxia on ambulation.   Lab work remarkable for leukocytosis.  BNP 128. Trace leukocytes/rare bacteria/contaminated urine. Repeat evaluation unchanged. Given increased cough, sputum and wheezing on exam concern for URI/influenza vs COPD exacerbation vs UTI causing acute confusion. Will request admission for observation. Pt shared with SP.   Final Clinical Impressions(s) / ED Diagnoses   Final diagnoses:  None    ED Discharge Orders    None       Arlean Hopping 01/29/17 2253    Charlesetta Shanks, MD 02/03/17 1745

## 2017-01-29 NOTE — Telephone Encounter (Signed)
Copied from [place] [id number]. Topic: Quick Communication - Lab Results >> Jan 29, 2017  2:49 PM Robina Ade, Helene Kelp D wrote: Patient daughter called wanting a call back from Dr. Sanda Klein or her CMA about her mothers lab results. Please call them back, thanks.

## 2017-01-29 NOTE — ED Notes (Signed)
Pt ambulated while on pulse oximetry, Pt's sats remained 96-98 while on 2L of O2. Pt had strong and steady gait with the assistance of a walker.
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
## 2017-01-29 NOTE — ED Triage Notes (Signed)
Per ems pt is from home and family was saying pt was treated for UTI 4 days ago and has seen a big difference in mental status in the past two days. More weakness than normal and confusion. 136/60, 1005 on 2L 76 p, cbg 186

## 2017-01-29 NOTE — ED Notes (Signed)
TTS assessment in process now.

## 2017-01-30 ENCOUNTER — Encounter (HOSPITAL_COMMUNITY): Payer: Self-pay

## 2017-01-30 DIAGNOSIS — J449 Chronic obstructive pulmonary disease, unspecified: Secondary | ICD-10-CM

## 2017-01-30 DIAGNOSIS — I5032 Chronic diastolic (congestive) heart failure: Secondary | ICD-10-CM

## 2017-01-30 DIAGNOSIS — J432 Centrilobular emphysema: Secondary | ICD-10-CM

## 2017-01-30 DIAGNOSIS — E782 Mixed hyperlipidemia: Secondary | ICD-10-CM

## 2017-01-30 DIAGNOSIS — I10 Essential (primary) hypertension: Secondary | ICD-10-CM

## 2017-01-30 LAB — CBC
HCT: 31.3 % — ABNORMAL LOW (ref 36.0–46.0)
HEMOGLOBIN: 10.4 g/dL — AB (ref 12.0–15.0)
MCH: 30.1 pg (ref 26.0–34.0)
MCHC: 33.2 g/dL (ref 30.0–36.0)
MCV: 90.5 fL (ref 78.0–100.0)
PLATELETS: 151 10*3/uL (ref 150–400)
RBC: 3.46 MIL/uL — ABNORMAL LOW (ref 3.87–5.11)
RDW: 14.9 % (ref 11.5–15.5)
WBC: 11.4 10*3/uL — ABNORMAL HIGH (ref 4.0–10.5)

## 2017-01-30 LAB — BASIC METABOLIC PANEL
ANION GAP: 13 (ref 5–15)
BUN: 12 mg/dL (ref 6–20)
CALCIUM: 8.4 mg/dL — AB (ref 8.9–10.3)
CO2: 27 mmol/L (ref 22–32)
Chloride: 101 mmol/L (ref 101–111)
Creatinine, Ser: 1.09 mg/dL — ABNORMAL HIGH (ref 0.44–1.00)
GFR calc Af Amer: 57 mL/min — ABNORMAL LOW (ref >60)
GFR, EST NON AFRICAN AMERICAN: 49 mL/min — AB (ref >60)
GLUCOSE: 93 mg/dL (ref 65–99)
Potassium: 3.4 mmol/L — ABNORMAL LOW (ref 3.5–5.1)
Sodium: 141 mmol/L (ref 135–145)

## 2017-01-30 LAB — INFLUENZA PANEL BY PCR (TYPE A & B)
Influenza A By PCR: NEGATIVE
Influenza B By PCR: NEGATIVE

## 2017-01-30 LAB — TSH: TSH: 11.222 u[IU]/mL — AB (ref 0.350–4.500)

## 2017-01-30 MED ORDER — ACETAMINOPHEN 650 MG RE SUPP: 650.0000 mg | Freq: Four times a day (QID) | RECTAL | Status: DC | PRN

## 2017-01-30 MED ORDER — IPRATROPIUM-ALBUTEROL 0.5-2.5 (3) MG/3ML IN SOLN
3.0000 mL | Freq: Four times a day (QID) | RESPIRATORY_TRACT | Status: DC
  Administered 2017-01-30: 3 mL via RESPIRATORY_TRACT
  Filled 2017-01-30: qty 3

## 2017-01-30 MED ORDER — ENOXAPARIN SODIUM 40 MG/0.4ML ~~LOC~~ SOLN
40.0000 mg | SUBCUTANEOUS | Status: DC
  Administered 2017-01-30 – 2017-02-03 (×5): 40 mg via SUBCUTANEOUS
  Filled 2017-01-30 (×5): qty 0.4

## 2017-01-30 MED ORDER — ACETAMINOPHEN 325 MG PO TABS
650.0000 mg | ORAL_TABLET | Freq: Four times a day (QID) | ORAL | Status: DC | PRN
  Administered 2017-02-01: 650 mg via ORAL
  Filled 2017-01-30: qty 2

## 2017-01-30 MED ORDER — BUDESONIDE 0.5 MG/2ML IN SUSP
0.5000 mg | Freq: Two times a day (BID) | RESPIRATORY_TRACT | Status: DC
  Administered 2017-01-30 – 2017-02-03 (×9): 0.5 mg via RESPIRATORY_TRACT
  Filled 2017-01-30 (×9): qty 2

## 2017-01-30 MED ORDER — IPRATROPIUM-ALBUTEROL 0.5-2.5 (3) MG/3ML IN SOLN
3.0000 mL | Freq: Three times a day (TID) | RESPIRATORY_TRACT | Status: DC
  Administered 2017-01-30 – 2017-02-03 (×14): 3 mL via RESPIRATORY_TRACT
  Filled 2017-01-30 (×15): qty 3

## 2017-01-30 MED ORDER — LEVOTHYROXINE SODIUM 112 MCG PO TABS
112.0000 ug | ORAL_TABLET | ORAL | Status: DC
  Administered 2017-01-30 – 2017-02-01 (×3): 112 ug via ORAL
  Filled 2017-01-30 (×3): qty 1

## 2017-01-30 MED ORDER — POTASSIUM CHLORIDE CRYS ER 20 MEQ PO TBCR
40.0000 meq | EXTENDED_RELEASE_TABLET | Freq: Once | ORAL | Status: AC
  Administered 2017-01-30: 40 meq via ORAL
  Filled 2017-01-30: qty 2

## 2017-01-30 MED ORDER — ONDANSETRON HCL 4 MG PO TABS: 4.0000 mg | ORAL_TABLET | Freq: Four times a day (QID) | ORAL | Status: DC | PRN

## 2017-01-30 MED ORDER — ONDANSETRON HCL 4 MG/2ML IJ SOLN: 4.0000 mg | Freq: Four times a day (QID) | INTRAMUSCULAR | Status: DC | PRN

## 2017-01-30 NOTE — Progress Notes (Signed)
PROGRESS NOTE  Bonnie Henry:[id number] DOB: 1940-03-10 DOA: 01/29/2017 PCP: Arnetha Courser, MD  HPI/Recap of past 24 hours: Bonnie Henry is a 77 y.o. female with past medical history significant for chronic resp failure on 2L O2 continuously, anxiety, anemia, asthma, chronic heart failure, breast cancer, chronic atrial fibrillation, pulmonary nodules, depression with psychosis and type 2 diabetes presents the emergency room with altered mental status.  On presentation productive cough and cxr with RLL infiltrates vs atelectasis. Also pyuria, started in the ED on rocephin. Admitted due to acute metabolic encephalopathy 2/2 to UTI, poa vs others.  01/30/17: patient seen and examined at her bedside. She is alert and oriented x 3. Admits to intermittent cough of 4 weeks duration. Treated in the outpatient setting without resolution of the cough. Admits to heavy smoking for greater than 30 years and quit almost 30 years ago.     Assessment/Plan: Active Problems:   Acute encephalopathy   Acute metabolic encephalopathy 2/2 to UTI, poa vs others CT head negative Urine cx Continue to hold gabapentin, buspar TSH 11.22 - start levothyroxine 112 mcg  Hypothyroidism -TSH 11.22 -levothyroxyne 112 mcg  COPD/centrilobular emphysema  -Poss mild case of AE -Continue Prn albuterol -continue Sch duonebs -started pulmicort  HTN -BP is stable -Cont Norvasc, spironolactone   Hx of pulmoanry nodules (12/08/16) -noted  Depression w/ anxiety -Continue abilify, depakote, cymbalta  Chronic dCHF EF 60% (2016) -Cont coreg -continue lasix, spironolactone -strict I&Os, daily weights  Hyperlipidemia -Continue statin  Allergies -Cont flonase  CAD -Con prn ntg sl -continue asa, lipitor, carvedilol  GERD -stable -Cont PPI  RLS -stable -Cont requip   Code Status: full  Family Communication: none at bedside   Disposition Plan: will stay another midnight for  further testing  Severity: Moderate to severe due to multiple commorbidities and advanced age. High risk for decompensation.   Consultants:  none  Procedures:  none  Antimicrobials:  rocephin  DVT prophylaxis:  sq lovenox    Objective: Vitals:   01/30/17 0003 01/30/17 0303 01/30/17 0500 01/30/17 0538  BP: (!) 117/48   128/64  Pulse: 72 74  72  Resp: 18 18  18   Temp: 98.2 F (36.8 C)   98.1 F (36.7 C)  TempSrc: Oral   Oral  SpO2: 97% 97%  100%  Weight:   90.7 kg (200 lb)   Height:        Intake/Output Summary (Last 24 hours) at 01/30/2017 0752 Last data filed at 01/30/2017 0500 Gross per 24 hour  Intake 0 ml  Output -  Net 0 ml   Filed Weights   01/29/17 1734 01/30/17 0500  Weight: 90.7 kg (200 lb) 90.7 kg (200 lb)    Exam:   General:  77 yo CF WD WN NAD A&O x 3  Cardiovascular: RRR no rubs or gallops  Respiratory: Mild diffused rales bilaterally. No wheezes  Abdomen: soft NT ND NBS x4  Musculoskeletal: non focal no LE edema  Skin: no rash noted  Psychiatry: Mood appropriate for condition and setting   Data Reviewed: CBC: Recent Labs  Lab 01/23/17 1233 01/29/17 1823 01/30/17 0246  WBC 9.4 13.4* 11.4*  NEUTROABS 5,903 10.1*  --   HGB 11.3* 10.6* 10.4*  HCT 33.6* 33.5* 31.3*  MCV 86.6 91.0 90.5  PLT 210 167 631   Basic Metabolic Panel: Recent Labs  Lab 01/23/17 1233 01/29/17 1823 01/30/17 0246  NA 140 136 141  K 4.5 3.6 3.4*  CL 101  99* 101  CO2 31 26 27   GLUCOSE 222* 178* 93  BUN 19 15 12   CREATININE 1.45* 1.16* 1.09*  CALCIUM 8.8 8.4* 8.4*   GFR: Estimated Creatinine Clearance: 48.4 mL/min (A) (by C-G formula based on SCr of 1.09 mg/dL (H)). Liver Function Tests: Recent Labs  Lab 01/23/17 1233 01/29/17 1823  AST 19 25  ALT 12 14  ALKPHOS  --  151*  BILITOT 0.9 1.6*  PROT 6.9  7.2 7.4  ALBUMIN  --  3.6   No results for input(s): LIPASE, AMYLASE in the last 168 hours. No results for input(s): AMMONIA in the  last 168 hours. Coagulation Profile: No results for input(s): INR, PROTIME in the last 168 hours. Cardiac Enzymes: No results for input(s): CKTOTAL, CKMB, CKMBINDEX, TROPONINI in the last 168 hours. BNP (last 3 results) No results for input(s): PROBNP in the last 8760 hours. HbA1C: No results for input(s): HGBA1C in the last 72 hours. CBG: Recent Labs  Lab 01/26/17 1739  GLUCAP 163*   Lipid Profile: No results for input(s): CHOL, HDL, LDLCALC, TRIG, CHOLHDL, LDLDIRECT in the last 72 hours. Thyroid Function Tests: Recent Labs    01/30/17 0246  TSH 11.222*   Anemia Panel: No results for input(s): VITAMINB12, FOLATE, FERRITIN, TIBC, IRON, RETICCTPCT in the last 72 hours. Urine analysis:    Component Value Date/Time   COLORURINE YELLOW 01/29/2017 1814   APPEARANCEUR CLEAR 01/29/2017 1814   APPEARANCEUR Cloudy (A) 02/20/2015 0948   LABSPEC 1.013 01/29/2017 1814   LABSPEC 1.004 09/01/2013 1134   PHURINE 6.0 01/29/2017 1814   GLUCOSEU 50 (A) 01/29/2017 1814   GLUCOSEU 50 mg/dL 09/01/2013 1134   HGBUR NEGATIVE 01/29/2017 1814   BILIRUBINUR NEGATIVE 01/29/2017 1814   BILIRUBINUR neg 05/02/2016 0821   BILIRUBINUR Negative 02/20/2015 0948   BILIRUBINUR Negative 09/01/2013 1134   KETONESUR NEGATIVE 01/29/2017 1814   PROTEINUR NEGATIVE 01/29/2017 1814   UROBILINOGEN 0.2 05/02/2016 0821   UROBILINOGEN 0.2 10/23/2014 1008   NITRITE NEGATIVE 01/29/2017 1814   LEUKOCYTESUR TRACE (A) 01/29/2017 1814   LEUKOCYTESUR 2+ (A) 02/20/2015 0948   LEUKOCYTESUR Negative 09/01/2013 1134   Sepsis Labs: @LABRCNTIP (procalcitonin:4,lacticidven:4)  ) Recent Results (from the past 240 hour(s))  Urine Culture     Status: None   Collection Time: 01/23/17 12:33 PM  Result Value Ref Range Status   MICRO NUMBER: [id number]  Final   SPECIMEN QUALITY: ADEQUATE  Final   Sample Source URINE  Final   STATUS: FINAL  Final   Result:   Final    Three or more organisms present, each greater than 10,000  cu/mL. May represent normal flora contamination from external genitalia. No further testing is required.      Studies: Dg Chest 2 View  Result Date: 01/29/2017 CLINICAL DATA:  Altered mental status EXAM: CHEST  2 VIEW COMPARISON:  01/26/2017, 12/09/2016, CT chest 12/08/2016 FINDINGS: No significant pleural effusion. Mild cardiomegaly with aortic atherosclerosis. No acute consolidation. Patchy opacity at the right middle lobe is similar compared to prior. No pneumothorax. Degenerative changes of the spine. Surgical changes at the right humeral head. IMPRESSION: No active cardiopulmonary disease.  Stable cardiomegaly Electronically Signed   By: Donavan Foil M.D.   On: 01/29/2017 19:52   Ct Head Wo Contrast  Result Date: 01/29/2017 CLINICAL DATA:  Recent urinary tract infection. Altered mental status with confusion. No reported injury. EXAM: CT HEAD WITHOUT CONTRAST TECHNIQUE: Contiguous axial images were obtained from the base of the skull through the vertex without intravenous  contrast. COMPARISON:  09/01/2013 head CT. FINDINGS: Brain: No evidence of parenchymal hemorrhage or extra-axial fluid collection. No mass lesion, mass effect, or midline shift. No CT evidence of acute infarction. Generalized cerebral volume loss. Nonspecific prominent subcortical and periventricular white matter hypodensity, most in keeping with chronic small vessel ischemic change. Cerebral ventricle sizes are stable and concordant with the degree of cerebral volume loss. Vascular: No acute abnormality. Skull: No evidence of calvarial fracture. Sinuses/Orbits: The visualized paranasal sinuses are essentially clear. Stable bilateral proptosis. Other: Mild partial left mastoid effusion. Clear right mastoid air cells. IMPRESSION: 1.  No evidence of acute intracranial abnormality. 2. Generalized cerebral volume loss and prominent chronic small vessel ischemic changes in the cerebral white matter. 3. Nonspecific mild partial left  mastoid effusion. Electronically Signed   By: Ilona Sorrel M.D.   On: 01/29/2017 19:26    Scheduled Meds: . amLODipine  5 mg Oral Daily  . ARIPiprazole  2 mg Oral Daily  . aspirin EC  81 mg Oral Daily  . atorvastatin  80 mg Oral QHS  . budesonide  0.5 mg Nebulization BID  . carvedilol  12.5 mg Oral BID WC  . divalproex  250 mg Oral QPM  . DULoxetine  60 mg Oral Daily  . enoxaparin (LOVENOX) injection  40 mg Subcutaneous Q24H  . ipratropium-albuterol  3 mL Nebulization TID  . [START ON 02/02/2017] levothyroxine  100 mcg Oral Once per day on Mon Tue Wed Thu  . levothyroxine  112 mcg Oral Once per day on Sun Fri Sat  . pantoprazole  20 mg Oral BID  . rOPINIRole  1 mg Oral TID  . spironolactone  25 mg Oral Daily    Continuous Infusions: . cefTRIAXone (ROCEPHIN)  IV       LOS: 1 day     Kayleen Memos, MD [facility] Pager [phone]  If 7PM-7AM, please contact night-coverage [email or web address] Password [facility] 01/30/2017, 7:52 AM

## 2017-01-30 NOTE — H&P (Addendum)
[facility] History and Physical  Bonnie Henry KVQ:[id number] DOB: 07/22/1940 DOA: 01/29/2017  Referring physician:  PCP: Arnetha Courser, MD   Chief Complaint: "It was my time."    HPI: KATELAND LEISINGER is a 77 y.o. female with past medical history significant for chronic resp failure, anxiety, anemia, asthma, heart attack, blood clot, heart failure, breast cancer, atrial fibrillation, depression with psychosis and type 2 diabetes presents the emergency room with altered mental status.  She was brought in by her daughter per the EDP.  Most history from chart and EDP as patient is unable to give a thorough history.  Patient earlier today had stated that she was supposed to die but changed to another person so she would not die.  Patient has no SI or HI.  She is very adamant about this.  Patient's daughter states that patient is a little abnormal but this is confused and the symptoms come and go.  Per EDP the patient has had a productive cough for the last 2 days with yellow sputum.    Course: EDP had behavioral health assess patient.  Patient was cleared to be admitted to the medical floor.  Started on Rocephin.  Hospitalist consulted for admission.  Review of Systems:  As per HPI otherwise 10 point review of systems negative.    Past Medical History:  Diagnosis Date  . Anemia   . Anxiety   . Aortic atherosclerosis ([place]) 12/09/2016   Chest CT Dec 2018  . Arthritis    "joints ache all over" . osteoarthritis  . Asthma   . Atrial fibrillation ([place])   . Breast cancer, left breast (HCC)    S/P mastectomy  . CAD (coronary artery disease)   . CHF (congestive heart failure) ([place])   . Chronic back pain   . Constipation 03/04/2015  . COPD (chronic obstructive pulmonary disease) ([place])   . Depression, major   . Diabetes mellitus without complication ([place])   . DVT (deep venous thrombosis) ([place])   . GERD (gastroesophageal reflux disease)   . Headache   . Heart murmur   .  Hepatitis    HEP "C"  . History of blood transfusion    "when I had my mastectomy"  . History of hiatal hernia   . History of right-sided carotid endarterectomy 06/10/2015  . Hypercholesteremia   . Hypertension   . Morbid obesity ([place])   . Myocardial infarction ([place])   . Neuropathy   . Peripheral vascular disease ([place])   . Post-surgical hypothyroidism   . Restless leg syndrome   . Shortness of breath dyspnea   . Sleep apnea    "2015 wore mask; lost weight; told didn't need mask anymore" (02/23/2014)  . Type II diabetes mellitus ([place])    Past Surgical History:  Procedure Laterality Date  . ARTERY BIOPSY Right 09/29/2014   Procedure: BIOPSY TEMPORAL ARTERY;  Surgeon: Angelia Mould, MD;  Location: [place];  Service: Vascular;  Laterality: Right;  . BARTHOLIN CYST MARSUPIALIZATION    . BREAST BIOPSY Left    Mastectomy  . CARDIAC CATHETERIZATION  2000's  . CAROTID ENDARTERECTOMY Left 2009  . CATARACT EXTRACTION W/ INTRAOCULAR LENS  IMPLANT, BILATERAL Bilateral ~ 2011  . CORONARY ANGIOPLASTY WITH STENT PLACEMENT  1990's X 2   "2; 1"  . ENDARTERECTOMY Right 05/16/2015   Procedure: ENDARTERECTOMY CAROTID;  Surgeon: Katha Cabal, MD;  Location: [facility];  Service: Vascular;  Laterality: Right;  . ESOPHAGOGASTRODUODENOSCOPY (EGD) WITH PROPOFOL N/A 12/12/2014  Procedure: ESOPHAGOGASTRODUODENOSCOPY (EGD) WITH PROPOFOL;  Surgeon: Lucilla Lame, MD;  Location: [facility];  Service: Endoscopy;  Laterality: N/A;  . EYE SURGERY Bilateral    Catract Extraction with IOL  . HAMMER TOE SURGERY Left    2nd and 3rd digits  . JOINT REPLACEMENT Bilateral    Total Hip Replacement  . JOINT REPLACEMENT Left    Total Knee Replacement  . LEFT HEART CATHETERIZATION WITH CORONARY ANGIOGRAM N/A 02/24/2014   Procedure: LEFT HEART CATHETERIZATION WITH CORONARY ANGIOGRAM;  Surgeon: Leonie Man, MD;  Location: [facility];  Service: Cardiovascular;  Laterality: N/A;  . MASTECTOMY Left 1990's  .  SHOULDER SURGERY Right    "cleaned it out; no scope"  . THYROIDECTOMY, PARTIAL  1950's  . TOE SURGERY Right    "cut piece of bone out so toe didn't dig into my foot"  . TONSILLECTOMY  1950's  . TOTAL HIP ARTHROPLASTY Bilateral 1990's  . TOTAL KNEE ARTHROPLASTY Left 1990's  . TRIGGER FINGER RELEASE Left   . TUBAL LIGATION    . VAGINAL HYSTERECTOMY     "partial"   Social History:  reports that she quit smoking about 23 years ago. Her smoking use included cigarettes. She has a 66.00 pack-year smoking history. she has never used smokeless tobacco. She reports that she does not drink alcohol or use drugs.  Allergies  Allergen Reactions  . Vasotec [Enalapril] Swelling    Throat swells  . Risperidone Rash  . Codeine Nausea And Vomiting  . Morphine And Related Nausea And Vomiting    Family History  Problem Relation Age of Onset  . Arthritis Mother   . Cancer Mother   . Diabetes Mother   . Heart disease Mother   . Hyperlipidemia Mother   . Hypertension Mother   . Alcohol abuse Father   . Brain cancer Father   . Arthritis Maternal Grandmother   . Alzheimer's disease Maternal Grandmother   . Diabetes Daughter   . Hyperlipidemia Daughter   . Hypertension Daughter   . Thyroid cancer Daughter   . Diabetes Son   . Diabetes Daughter   . Hypertension Daughter   . Diabetes Son   . Hypertension Son   . Hyperlipidemia Son   . Thyroid cancer Son   . Diabetes Brother   . Diabetes Brother      Prior to Admission medications   Medication Sig Start Date End Date Taking? Authorizing Provider  acetaminophen (TYLENOL) 500 MG tablet Take 2 tablets (1,000 mg total) by mouth every 6 (six) hours as needed for moderate pain. 05/08/15   Duffy Bruce, MD  albuterol (ACCUNEB) 0.63 MG/3ML nebulizer solution Take 1 ampule by nebulization every 6 (six) hours as needed for wheezing or shortness of breath.     [provider]  albuterol (PROAIR HFA) 108 (90 BASE) MCG/ACT inhaler Inhale 2  puffs into the lungs every 4 (four) hours as needed for wheezing or shortness of breath.     [provider]  amLODipine (NORVASC) 5 MG tablet Take 5 mg by mouth daily.  12/22/16   [provider]  ARIPiprazole (ABILIFY) 2 MG tablet Take 2 mg by mouth daily.  01/16/17   [provider]  Ascorbic Acid (VITAMIN C PO) Take 1 tablet by mouth 3 (three) times a week. MWF    [provider]  aspirin EC 81 MG tablet Take 81 mg by mouth daily.    [provider]  atorvastatin (LIPITOR) 80 MG tablet Take 1  tablet (80 mg total) by mouth at bedtime. 08/07/16   Lada, Satira Anis, MD  budesonide (PULMICORT) 0.5 MG/2ML nebulizer solution Take 0.5 mg by nebulization 2 (two) times daily.     [provider]  bumetanide (BUMEX) 0.5 MG tablet Take 0.5 mg by mouth daily as needed (fluid).     [provider]  busPIRone (BUSPAR) 15 MG tablet TAKE ONE TABLET BY MOUTH TWICE DAILY 12/04/16   Arnetha Courser, MD  carvedilol (COREG) 12.5 MG tablet Take 12.5 mg by mouth 2 (two) times daily with a meal.  01/24/14   [provider]  divalproex (DEPAKOTE ER) 250 MG 24 hr tablet Take 250 mg by mouth every evening.  01/16/17   [provider]  doxycycline (VIBRA-TABS) 100 MG tablet Take 1 tablet (100 mg total) by mouth 2 (two) times daily for 7 days. 01/24/17 01/31/17  Arnetha Courser, MD  DULoxetine (CYMBALTA) 60 MG capsule Take 1 capsule (60 mg total) by mouth daily. 08/22/16   Arnetha Courser, MD  fluticasone (FLONASE) 50 MCG/ACT nasal spray Place 2 sprays into both nostrils as needed for allergies.  10/16/14   [provider]  formoterol (PERFOROMIST) 20 MCG/2ML nebulizer solution Inhale 20 mcg into the lungs 2 (two) times daily.     [provider]  furosemide (LASIX) 40 MG tablet Take by mouth. 12/23/16   [provider]  gabapentin (NEURONTIN) 600 MG tablet Take 1 tablet (600 mg total) 2 (two) times daily by mouth. 11/21/16    Lada, Satira Anis, MD  Hypromellose (ARTIFICIAL TEARS OP) Place 1 drop into both eyes as needed (dry eyes).     [provider]  insulin NPH Human (HUMULIN N,NOVOLIN N) 100 UNIT/ML injection Inject 24 Units into the skin at bedtime.  06/14/15 01/13/17  [provider]  insulin regular (NOVOLIN R,HUMULIN R) 100 units/mL injection Inject 28 Units 3 (three) times daily before meals into the skin. Based on sliding scale     [provider]  levothyroxine (SYNTHROID, LEVOTHROID) 100 MCG tablet Take 1 tablet (100 mcg total) by mouth every other day. (alternate with the 112 mcg strength) 09/08/14   Lada, Satira Anis, MD  levothyroxine (SYNTHROID, LEVOTHROID) 112 MCG tablet Take 1 tablet (112 mcg total) by mouth every other day. (alternate with the 100 mcg strength) Patient taking differently: Take 112 mcg by mouth See admin instructions. Take 1 tablet (112 mcg) by mouth before breakfast on Friday, Saturday, Sunday (take 100 mcg other days) 09/08/14   Arnetha Courser, MD  meclizine (ANTIVERT) 25 MG tablet Take 0.5-1 tablets (12.5-25 mg total) by mouth 3 (three) times daily as needed for dizziness. 05/29/16   Arnetha Courser, MD  montelukast (SINGULAIR) 10 MG tablet TAKE ONE TABLET BY MOUTH ONCE DAILY 01/07/17   Lada, Satira Anis, MD  Multiple Vitamin (THERA) TABS Take by mouth daily.  12/23/16   [provider]  nitroGLYCERIN (NITROSTAT) 0.4 MG SL tablet Place 1 tablet (0.4 mg total) every 5 (five) minutes as needed under the tongue for chest pain. Max of 3 pills total, call 911 Patient not taking: Reported on 01/23/2017 11/21/16   Arnetha Courser, MD  Omega-3 Fatty Acids (FISH OIL) 1200 MG CAPS Take 1 capsule by mouth 2 (two) times a week.     [provider]  oxyCODONE-acetaminophen (ROXICET) 5-325 MG tablet Take 0.5-1 tablets by mouth every 6 (six) hours as needed for moderate pain or severe pain. 01/23/17   Lada,  Satira Anis, MD  OXYGEN Inhale 2 L into the lungs continuous.     [provider]  pantoprazole (PROTONIX) 20 MG tablet TAKE ONE TABLET BY MOUTH TWICE DAILY 06/03/16   Lada, Satira Anis, MD  ranitidine (ZANTAC) 300 MG tablet Take 1 tablet (300 mg total) by mouth at bedtime. 01/15/16   Lada, Satira Anis, MD  rOPINIRole (REQUIP) 1 MG tablet TAKE 1 TABLET BY MOUTH THREE TIMES DAILY 11/13/16   Arnetha Courser, MD  spironolactone (ALDACTONE) 25 MG tablet Take 25 mg by mouth daily. 08/27/14   [provider]  tiZANidine (ZANAFLEX) 2 MG tablet TAKE 1 TABLET BY MOUTH AT BEDTIME Patient not taking: Reported on 01/26/2017 09/17/16   Alda Berthold, DO   Physical Exam: Vitals:   01/29/17 2100 01/29/17 2200 01/29/17 2313 01/30/17 0003  BP: 128/61 118/62  (!) 117/48  Pulse: 72   72  Resp: (!) 26   18  Temp:   99 F (37.2 C) 98.2 F (36.8 C)  TempSrc:   Oral Oral  SpO2: 100%   97%  Weight:      Height:        Wt Readings from Last 3 Encounters:  01/29/17 90.7 kg (200 lb)  01/26/17 90.7 kg (200 lb)  01/13/17 93.9 kg (207 lb)    General:  Appears calm and comfortable; A&Ox3 Eyes:  PERRL, EOMI, normal lids, iris ENT:  grossly normal hearing, lips & tongue Neck:  no LAD, masses or thyromegaly Cardiovascular:  RRR, no m/r/g. No LE edema.  Respiratory:  RUL wheeze, no rales, nl WOB Abdomen:  soft, ntnd Skin:  no rash or induration seen on limited exam Musculoskeletal:  grossly normal tone BUE/BLE Psychiatric:  grossly normal mood and affect, speech fluent and appropriate Neurologic:  CN 2-12 grossly intact, moves all extremities in coordinated fashion.          Labs on Admission:  Basic Metabolic Panel: Recent Labs  Lab 01/23/17 1233 01/29/17 1823  NA 140 136  K 4.5 3.6  CL 101 99*  CO2 31 26  GLUCOSE 222* 178*  BUN 19 15  CREATININE 1.45* 1.16*  CALCIUM 8.8 8.4*   Liver Function Tests: Recent Labs  Lab 01/23/17 1233 01/29/17 1823  AST 19 25  ALT 12 14  ALKPHOS  --  151*  BILITOT 0.9 1.6*  PROT 6.9  7.2 7.4  ALBUMIN  --  3.6    No results for input(s): LIPASE, AMYLASE in the last 168 hours. No results for input(s): AMMONIA in the last 168 hours. CBC: Recent Labs  Lab 01/23/17 1233 01/29/17 1823  WBC 9.4 13.4*  NEUTROABS 5,903 10.1*  HGB 11.3* 10.6*  HCT 33.6* 33.5*  MCV 86.6 91.0  PLT 210 167   Cardiac Enzymes: No results for input(s): CKTOTAL, CKMB, CKMBINDEX, TROPONINI in the last 168 hours.  BNP (last 3 results) Recent Labs    01/29/17 1823  BNP 128.5*    ProBNP (last 3 results) No results for input(s): PROBNP in the last 8760 hours.   Serum creatinine: 1.16 mg/dL (H) 01/29/17 1823 Estimated creatinine clearance: 45.5 mL/min (A)  CBG: Recent Labs  Lab 01/26/17 1739  GLUCAP 163*    Radiological Exams on Admission: Dg Chest 2 View  Result Date: 01/29/2017 CLINICAL DATA:  Altered mental status EXAM: CHEST  2 VIEW COMPARISON:  01/26/2017, 12/09/2016, CT chest 12/08/2016 FINDINGS: No significant pleural effusion. Mild cardiomegaly with aortic atherosclerosis. No acute consolidation. Patchy opacity at the right middle  lobe is similar compared to prior. No pneumothorax. Degenerative changes of the spine. Surgical changes at the right humeral head. IMPRESSION: No active cardiopulmonary disease.  Stable cardiomegaly Electronically Signed   By: Donavan Foil M.D.   On: 01/29/2017 19:52   Ct Head Wo Contrast  Result Date: 01/29/2017 CLINICAL DATA:  Recent urinary tract infection. Altered mental status with confusion. No reported injury. EXAM: CT HEAD WITHOUT CONTRAST TECHNIQUE: Contiguous axial images were obtained from the base of the skull through the vertex without intravenous contrast. COMPARISON:  09/01/2013 head CT. FINDINGS: Brain: No evidence of parenchymal hemorrhage or extra-axial fluid collection. No mass lesion, mass effect, or midline shift. No CT evidence of acute infarction. Generalized cerebral volume loss. Nonspecific prominent subcortical and periventricular white matter  hypodensity, most in keeping with chronic small vessel ischemic change. Cerebral ventricle sizes are stable and concordant with the degree of cerebral volume loss. Vascular: No acute abnormality. Skull: No evidence of calvarial fracture. Sinuses/Orbits: The visualized paranasal sinuses are essentially clear. Stable bilateral proptosis. Other: Mild partial left mastoid effusion. Clear right mastoid air cells. IMPRESSION: 1.  No evidence of acute intracranial abnormality. 2. Generalized cerebral volume loss and prominent chronic small vessel ischemic changes in the cerebral white matter. 3. Nonspecific mild partial left mastoid effusion. Electronically Signed   By: Ilona Sorrel M.D.   On: 01/29/2017 19:26    EKG: Independently reviewed. NSR. No STEMI  Assessment/Plan Active Problems:   Acute encephalopathy   Encephalopathy, acute /fever/UTI CT head negative Based on chart review patient seems to have been on doxycycline, starting Rocephin to cover for UTI And U culture sent Hold gabapentin, buspar Check TSH  COPD  Poss mild case of AE Prn albuterol Sch duonebs HTN Cont Norvasc, spironolactone  Started pulmicort  Depression w/ anxiety Con abilify, depakote, cymbalta  CHF Cont coreg Restart lasix in AM  Hyperlipidemia Continue statin  Allergies Cont flonase  Hypothyroidism Cont OP synthroid 100/112 mcg qd No signs of hyper or hypothyroidism  CAD Con prn ntg sl  GERD Cont PPI  RLS Cont requip  Code Status: FC  DVT Prophylaxis: lovenox Family Communication: none available Disposition Plan: Pending Improvement  Status: inpt med surg  Elwin Mocha, MD Family Medicine [facility] [email or web address] Password [email or web address]

## 2017-01-30 NOTE — Telephone Encounter (Signed)
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

## 2017-01-30 NOTE — Telephone Encounter (Signed)
Copied from [place] [id number]. Topic: General - Other >> Jan 29, 2017  4:45 PM Patrice Paradise wrote: Reason for CRM: Patient daughter Savanah Bayles called to let Dr. Sanda Klein know that the patient is been transported to [facility]. She is requesting a call back asap @ [phone]

## 2017-01-31 DIAGNOSIS — R262 Difficulty in walking, not elsewhere classified: Secondary | ICD-10-CM

## 2017-01-31 DIAGNOSIS — R5381 Other malaise: Secondary | ICD-10-CM

## 2017-01-31 LAB — CBC
HCT: 34.7 % — ABNORMAL LOW (ref 36.0–46.0)
Hemoglobin: 10.9 g/dL — ABNORMAL LOW (ref 12.0–15.0)
MCH: 28.6 pg (ref 26.0–34.0)
MCHC: 31.4 g/dL (ref 30.0–36.0)
MCV: 91.1 fL (ref 78.0–100.0)
PLATELETS: 182 10*3/uL (ref 150–400)
RBC: 3.81 MIL/uL — ABNORMAL LOW (ref 3.87–5.11)
RDW: 14.7 % (ref 11.5–15.5)
WBC: 11.2 10*3/uL — ABNORMAL HIGH (ref 4.0–10.5)

## 2017-01-31 LAB — BASIC METABOLIC PANEL
Anion gap: 14 (ref 5–15)
BUN: 8 mg/dL (ref 6–20)
CALCIUM: 9 mg/dL (ref 8.9–10.3)
CO2: 23 mmol/L (ref 22–32)
Chloride: 96 mmol/L — ABNORMAL LOW (ref 101–111)
Creatinine, Ser: 1.05 mg/dL — ABNORMAL HIGH (ref 0.44–1.00)
GFR calc Af Amer: 59 mL/min — ABNORMAL LOW (ref >60)
GFR calc non Af Amer: 51 mL/min — ABNORMAL LOW (ref >60)
GLUCOSE: 238 mg/dL — AB (ref 65–99)
POTASSIUM: 4.6 mmol/L (ref 3.5–5.1)
Sodium: 133 mmol/L — ABNORMAL LOW (ref 135–145)

## 2017-01-31 LAB — URINE CULTURE: CULTURE: NO GROWTH

## 2017-01-31 MED ORDER — SODIUM CHLORIDE 0.9 % IV SOLN: INTRAVENOUS | Status: DC

## 2017-01-31 MED ORDER — DIPHENHYDRAMINE-ZINC ACETATE 2-0.1 % EX CREA
TOPICAL_CREAM | Freq: Every day | CUTANEOUS | Status: DC | PRN
  Administered 2017-02-02: 22:00:00 via TOPICAL
  Filled 2017-01-31: qty 28

## 2017-01-31 NOTE — Progress Notes (Signed)
Daughter explained if patient asks for her mom she means her daughter. The patient gets combative and increasingly confused at night.

## 2017-01-31 NOTE — Progress Notes (Signed)
MD was notified of patient continuously pulling off her telemetry leads and she is refusing for me to put them back on. MD is going to D/C the telemetry.

## 2017-01-31 NOTE — Progress Notes (Signed)
MD notified of patient and family concerns and spoke with the family. They are also are requesting something for her itchy skin, head and nose. She also requested something to help her sleep. I let the MD know.

## 2017-01-31 NOTE — Progress Notes (Signed)
PROGRESS NOTE  KENDELL GAMMON WHQ:[id number] DOB: 1940/09/06 DOA: 01/29/2017 PCP: Arnetha Courser, MD  HPI/Recap of past 24 hours: BEBE MONCURE is a 77 y.o. female with past medical history significant for chronic resp failure on 2L O2 continuously, anxiety, anemia, asthma, chronic heart failure, breast cancer, chronic atrial fibrillation, pulmonary nodules, depression with psychosis and type 2 diabetes presents the emergency room with altered mental status.  On presentation productive cough and cxr with RLL infiltrates vs atelectasis. Also pyuria, started in the ED on rocephin. Admitted due to acute metabolic encephalopathy 2/2 to UTI, poa vs others.  01/31/17: patient seen and examined at her bedside. She is alert and oriented x 3. Reports to generalized weakness. Pt order to assess. PT recommends SNF due to physical deconditioning.     Assessment/Plan: Active Problems:   Acute encephalopathy   Acute metabolic encephalopathy 2/2 to UTI, poa vs others, resolved CT head negative Urine cx no growth to date Continue to hold gabapentin, buspar TSH 11.22 continue levothyroxine 112 mcg  UTI, poa -continue IV antibiotics -continue IV fluid -monitor urine output  pseudoHyponatremia -Na+ 133, chemistry blood gluciose 238 -corrected Na+ 135 -treat hyperglycemia  Hyperglycemia -ISS -A1C 8.7 (01/23/17) -BMP am  Hypothyroidism -TSH 11.22 -levothyroxyne 112 mcg  Moderate to severe physical deconditioning/generalized weakness -PT evaluated and recommended short term rehab for physical therapy -continue PT  COPD/centrilobular emphysema  -Poss mild case of AE -Continue Prn albuterol -continue Sch duonebs -continue pulmicort  HTN -BP is stable -Cont Norvasc, spironolactone   Hx of pulmoanry nodules (12/08/16) -noted  Depression w/ anxiety -Continue abilify, depakote, cymbalta  Chronic dCHF EF 60% (2016) -Cont coreg -continue lasix, spironolactone -strict  I&Os, daily weights  Hyperlipidemia -Continue statin  Allergies -Cont flonase  CAD -Con prn ntg sl -continue asa, lipitor, carvedilol  GERD -stable -Cont PPI  RLS -stable -Cont requip   Code Status: full  Family Communication: none at bedside   Disposition Plan: will stay another midnight to continue IV ceftriaxone day#1  Severity: Moderate to severe due to multiple commorbidities and advanced age. High risk for decompensation.   Consultants:  none  Procedures:  none  Antimicrobials:  rocephin  DVT prophylaxis:  sq lovenox    Objective: Vitals:   01/31/17 0856 01/31/17 0933 01/31/17 1422 01/31/17 1452  BP:  (!) 142/56 (!) 130/58   Pulse:  76 73   Resp:   16   Temp:   98.6 F (37 C)   TempSrc:   Oral   SpO2: 98%  96% 94%  Weight:      Height:        Intake/Output Summary (Last 24 hours) at 01/31/2017 1609 Last data filed at 01/31/2017 1423 Gross per 24 hour  Intake 370 ml  Output -  Net 370 ml   Filed Weights   01/29/17 1734 01/30/17 0500  Weight: 90.7 kg (200 lb) 90.7 kg (200 lb)    Exam: 01/31/17 pt seen and examined at her bedside. Her physical exam is unchanged except for changes mentioned below.   General:  77 yo CF WD WN NAD A&O x 3  Cardiovascular: RRR no rubs or gallops  Respiratory: Mild diffused rales at bases bilaterally. No wheezes  Abdomen: soft NT ND NBS x4  Musculoskeletal: non focal no LE edema  Skin: no rash noted  Psychiatry: Mood appropriate for condition and setting   Data Reviewed: CBC: Recent Labs  Lab 01/29/17 1823 01/30/17 0246 01/31/17 0830  WBC 13.4* 11.4* 11.2*  NEUTROABS 10.1*  --   --   HGB 10.6* 10.4* 10.9*  HCT 33.5* 31.3* 34.7*  MCV 91.0 90.5 91.1  PLT 167 151 174   Basic Metabolic Panel: Recent Labs  Lab 01/29/17 1823 01/30/17 0246 01/31/17 0830  NA 136 141 133*  K 3.6 3.4* 4.6  CL 99* 101 96*  CO2 26 27 23   GLUCOSE 178* 93 238*  BUN 15 12 8   CREATININE 1.16* 1.09*  1.05*  CALCIUM 8.4* 8.4* 9.0   GFR: Estimated Creatinine Clearance: 50.2 mL/min (A) (by C-G formula based on SCr of 1.05 mg/dL (H)). Liver Function Tests: Recent Labs  Lab 01/29/17 1823  AST 25  ALT 14  ALKPHOS 151*  BILITOT 1.6*  PROT 7.4  ALBUMIN 3.6   No results for input(s): LIPASE, AMYLASE in the last 168 hours. No results for input(s): AMMONIA in the last 168 hours. Coagulation Profile: No results for input(s): INR, PROTIME in the last 168 hours. Cardiac Enzymes: No results for input(s): CKTOTAL, CKMB, CKMBINDEX, TROPONINI in the last 168 hours. BNP (last 3 results) No results for input(s): PROBNP in the last 8760 hours. HbA1C: No results for input(s): HGBA1C in the last 72 hours. CBG: Recent Labs  Lab 01/26/17 1739  GLUCAP 163*   Lipid Profile: No results for input(s): CHOL, HDL, LDLCALC, TRIG, CHOLHDL, LDLDIRECT in the last 72 hours. Thyroid Function Tests: Recent Labs    01/30/17 0246  TSH 11.222*   Anemia Panel: No results for input(s): VITAMINB12, FOLATE, FERRITIN, TIBC, IRON, RETICCTPCT in the last 72 hours. Urine analysis:    Component Value Date/Time   COLORURINE YELLOW 01/29/2017 1814   APPEARANCEUR CLEAR 01/29/2017 1814   APPEARANCEUR Cloudy (A) 02/20/2015 0948   LABSPEC 1.013 01/29/2017 1814   LABSPEC 1.004 09/01/2013 1134   PHURINE 6.0 01/29/2017 1814   GLUCOSEU 50 (A) 01/29/2017 1814   GLUCOSEU 50 mg/dL 09/01/2013 1134   HGBUR NEGATIVE 01/29/2017 1814   BILIRUBINUR NEGATIVE 01/29/2017 1814   BILIRUBINUR neg 05/02/2016 0821   BILIRUBINUR Negative 02/20/2015 0948   BILIRUBINUR Negative 09/01/2013 1134   KETONESUR NEGATIVE 01/29/2017 1814   PROTEINUR NEGATIVE 01/29/2017 1814   UROBILINOGEN 0.2 05/02/2016 0821   UROBILINOGEN 0.2 10/23/2014 1008   NITRITE NEGATIVE 01/29/2017 1814   LEUKOCYTESUR TRACE (A) 01/29/2017 1814   LEUKOCYTESUR 2+ (A) 02/20/2015 0948   LEUKOCYTESUR Negative 09/01/2013 1134   Sepsis  Labs: @LABRCNTIP (procalcitonin:4,lacticidven:4)  ) Recent Results (from the past 240 hour(s))  Urine Culture     Status: None   Collection Time: 01/23/17 12:33 PM  Result Value Ref Range Status   MICRO NUMBER: [id number]  Final   SPECIMEN QUALITY: ADEQUATE  Final   Sample Source URINE  Final   STATUS: FINAL  Final   Result:   Final    Three or more organisms present, each greater than 10,000 cu/mL. May represent normal flora contamination from external genitalia. No further testing is required.  Urine culture     Status: None   Collection Time: 01/29/17  6:14 PM  Result Value Ref Range Status   Specimen Description URINE, CLEAN CATCH  Final   Special Requests NONE  Final   Culture NO GROWTH  Final   Report Status 01/31/2017 FINAL  Final      Studies: No results found.  Scheduled Meds: . amLODipine  5 mg Oral Daily  . ARIPiprazole  2 mg Oral Daily  . aspirin EC  81 mg Oral Daily  . atorvastatin  80 mg Oral QHS  .  budesonide  0.5 mg Nebulization BID  . carvedilol  12.5 mg Oral BID WC  . divalproex  250 mg Oral QPM  . DULoxetine  60 mg Oral Daily  . enoxaparin (LOVENOX) injection  40 mg Subcutaneous Q24H  . ipratropium-albuterol  3 mL Nebulization TID  . [START ON 02/02/2017] levothyroxine  100 mcg Oral Once per day on Mon Tue Wed Thu  . levothyroxine  112 mcg Oral Once per day on Sun Fri Sat  . pantoprazole  20 mg Oral BID  . rOPINIRole  1 mg Oral TID  . spironolactone  25 mg Oral Daily    Continuous Infusions: . cefTRIAXone (ROCEPHIN)  IV Stopped (01/30/17 2222)     LOS: 2 days     Kayleen Memos, MD [facility] Pager [phone]  If 7PM-7AM, please contact night-coverage [email or web address] Password [email or web address] 01/31/2017, 4:09 PM

## 2017-01-31 NOTE — Evaluation (Signed)
Physical Therapy Evaluation Patient Details Name: Bonnie Henry MRN: [id number] DOB: [birth year] Today's Date: 01/31/2017   History of Present Illness  77 yo female with onset of AMS and recent UTI was found to still have UTI, on 2L O2 at home and continuous use, off O2 at hosp.  Has acute metabolic encephalopathy with lung infiltrates, PMHx:  RLS, CAD, psychosis, cardiomegaly, UTI, CHF, asthma, a-fib, pulm nodules, breast CA, emphysema, EF 60%  Clinical Impression  Pt is struggling to give PT details about home, and is not aware of her lack of safety with mobility.  Pt is able to walk assisted but tends to be still and not try to move.  Will focus acute PT on progression of mobility to walk and to strengthen LE's for avoidance of falls.    Follow Up Recommendations SNF    Equipment Recommendations  Rolling walker with 5" wheels    Recommendations for Other Services       Precautions / Restrictions Precautions Precautions: Fall Precaution Comments: has RW in front of her but not on L2 Restrictions Weight Bearing Restrictions: No Other Position/Activity Restrictions: aggressively refused to let PT muscle test her legs      Mobility  Bed Mobility Overal bed mobility: Needs Assistance             General bed mobility comments: Supervision for safety   Transfers Overall transfer level: Needs assistance Equipment used: Rolling walker (2 wheeled);1 person hand held assist Transfers: Sit to/from Stand Sit to Stand: Min assist         General transfer comment: requires help to power up and control initial standing balnce  Ambulation/Gait Ambulation/Gait assistance: Min assist Ambulation Distance (Feet): 45 Feet Assistive device: Rolling walker (2 wheeled);1 person hand held assist Gait Pattern/deviations: Step-to pattern;Step-through pattern;Decreased stride length;Decreased dorsiflexion - right;Decreased dorsiflexion - left;Shuffle;Trunk flexed Gait velocity:  reduced Gait velocity interpretation: Below normal speed for age/gender General Gait Details: pt is using RW and reports having one at [employer]    Modified Rankin (Stroke Patients Only)       Balance Overall balance assessment: Needs assistance Sitting-balance support: Feet supported;Bilateral upper extremity supported Sitting balance-Leahy Scale: Fair     Standing balance support: Bilateral upper extremity supported;During functional activity Standing balance-Leahy Scale: Poor                               Pertinent Vitals/Pain Pain Assessment: No/denies pain    Home Living Family/patient expects to be discharged to:: Private residence Living Arrangements: Children;Other relatives;Other (Comment) Available Help at Discharge: Family;Available PRN/intermittently Type of Home: House         Home Equipment: Walker - 2 wheels(oxygen) Additional Comments: Pt is not a good historian due to aggressive behavior when she is confronted with a task that challenges her    Prior Function Level of Independence: Independent with assistive device(s)(not sure)         Comments: pt cannot give a coherent history     Hand Dominance        Extremity/Trunk Assessment   Upper Extremity Assessment Upper Extremity Assessment: Overall WFL for tasks assessed    Lower Extremity Assessment Lower Extremity Assessment: Difficult to assess due to impaired cognition(pt will not let PT mm test her)    Cervical / Trunk Assessment Cervical / Trunk Assessment: Kyphotic  Communication  Communication: Expressive difficulties  Cognition Arousal/Alertness: Awake/alert Behavior During Therapy: Agitated;Impulsive Overall Cognitive Status: No family/caregiver present to determine baseline cognitive functioning                                 General Comments: not sure if the level of confusion is baseline      General  Comments      Exercises     Assessment/Plan    PT Assessment Patient needs continued PT services  PT Problem List Decreased strength;Decreased range of motion;Decreased balance;Decreased activity tolerance;Decreased coordination;Decreased mobility;Decreased cognition;Decreased knowledge of use of DME;Decreased safety awareness;Obesity;Decreased skin integrity       PT Treatment Interventions DME instruction;Gait training;Functional mobility training;Therapeutic activities;Therapeutic exercise;Balance training;Neuromuscular re-education;Patient/family education    PT Goals (Current goals can be found in the Care Plan section)  Acute Rehab PT Goals Patient Stated Goal: to get out of hospital and get daughter on the phone PT Goal Formulation: With patient Time For Goal Achievement: 02/14/17 Potential to Achieve Goals: Good    Frequency Min 2X/week   Barriers to discharge Decreased caregiver support home alone and unsafe balance noted     Co-evaluation               AM-PAC PT "6 Clicks" Daily Activity  Outcome Measure Difficulty turning over in bed (including adjusting bedclothes, sheets and blankets)?: A Little Difficulty moving from lying on back to sitting on the side of the bed? : Unable Difficulty sitting down on and standing up from a chair with arms (e.g., wheelchair, bedside commode, etc,.)?: Unable Help needed moving to and from a bed to chair (including a wheelchair)?: A Little Help needed walking in hospital room?: A Little Help needed climbing 3-5 steps with a railing? : A Lot 6 Click Score: 13    End of Session Equipment Utilized During Treatment: Gait belt Activity Tolerance: Patient tolerated treatment well;Patient limited by fatigue Patient left: in bed;with call bell/phone within reach;with bed alarm set Nurse Communication: Mobility status PT Visit Diagnosis: Unsteadiness on feet (R26.81);Other abnormalities of gait and mobility (R26.89);Muscle weakness  (generalized) (M62.81);Difficulty in walking, not elsewhere classified (R26.2)    Time: 1275-1700 PT Time Calculation (min) (ACUTE ONLY): 14 min   Charges:   PT Evaluation $PT Eval Moderate Complexity: 1 Mod     PT G Codes:   PT G-Codes **NOT FOR INPATIENT CLASS** Functional Assessment Tool Used: AM-PAC 6 Clicks Basic Mobility    Ramond Dial 01/31/2017, 2:42 PM   Mee Hives, PT MS Acute Rehab Dept. Number: [place] [facility] [place]

## 2017-02-01 DIAGNOSIS — E669 Obesity, unspecified: Secondary | ICD-10-CM

## 2017-02-01 DIAGNOSIS — Z811 Family history of alcohol abuse and dependence: Secondary | ICD-10-CM

## 2017-02-01 DIAGNOSIS — F419 Anxiety disorder, unspecified: Secondary | ICD-10-CM

## 2017-02-01 DIAGNOSIS — E1169 Type 2 diabetes mellitus with other specified complication: Secondary | ICD-10-CM

## 2017-02-01 DIAGNOSIS — Z87891 Personal history of nicotine dependence: Secondary | ICD-10-CM

## 2017-02-01 DIAGNOSIS — E785 Hyperlipidemia, unspecified: Secondary | ICD-10-CM

## 2017-02-01 DIAGNOSIS — Z810 Family history of intellectual disabilities: Secondary | ICD-10-CM

## 2017-02-01 DIAGNOSIS — F333 Major depressive disorder, recurrent, severe with psychotic symptoms: Secondary | ICD-10-CM

## 2017-02-01 DIAGNOSIS — E119 Type 2 diabetes mellitus without complications: Secondary | ICD-10-CM

## 2017-02-01 DIAGNOSIS — R450 Nervousness: Secondary | ICD-10-CM

## 2017-02-01 DIAGNOSIS — N390 Urinary tract infection, site not specified: Principal | ICD-10-CM

## 2017-02-01 DIAGNOSIS — Z853 Personal history of malignant neoplasm of breast: Secondary | ICD-10-CM

## 2017-02-01 LAB — GLUCOSE, CAPILLARY
GLUCOSE-CAPILLARY: 192 mg/dL — AB (ref 65–99)
Glucose-Capillary: 364 mg/dL — ABNORMAL HIGH (ref 65–99)
Glucose-Capillary: 277 mg/dL — ABNORMAL HIGH (ref 65–99)

## 2017-02-01 MED ORDER — INSULIN ASPART 100 UNIT/ML ~~LOC~~ SOLN
0.0000 [IU] | Freq: Every day | SUBCUTANEOUS | Status: DC
  Administered 2017-02-01: 3 [IU] via SUBCUTANEOUS

## 2017-02-01 MED ORDER — ARIPIPRAZOLE 2 MG PO TABS
2.0000 mg | ORAL_TABLET | Freq: Every day | ORAL | Status: DC
  Administered 2017-02-02: 2 mg via ORAL
  Filled 2017-02-01 (×2): qty 1

## 2017-02-01 MED ORDER — DIVALPROEX SODIUM ER 250 MG PO TB24
250.0000 mg | ORAL_TABLET | Freq: Two times a day (BID) | ORAL | Status: DC
  Administered 2017-02-01 – 2017-02-03 (×4): 250 mg via ORAL
  Filled 2017-02-01 (×5): qty 1

## 2017-02-01 MED ORDER — TRAZODONE HCL 50 MG PO TABS
50.0000 mg | ORAL_TABLET | Freq: Every evening | ORAL | Status: DC | PRN
  Administered 2017-02-01 – 2017-02-02 (×2): 50 mg via ORAL
  Filled 2017-02-01 (×2): qty 1

## 2017-02-01 MED ORDER — INSULIN ASPART 100 UNIT/ML ~~LOC~~ SOLN
0.0000 [IU] | Freq: Three times a day (TID) | SUBCUTANEOUS | Status: DC
  Administered 2017-02-01: 3 [IU] via SUBCUTANEOUS
  Administered 2017-02-01: 15 [IU] via SUBCUTANEOUS
  Administered 2017-02-02: 3 [IU] via SUBCUTANEOUS
  Administered 2017-02-02 – 2017-02-03 (×3): 8 [IU] via SUBCUTANEOUS
  Administered 2017-02-03: 5 [IU] via SUBCUTANEOUS

## 2017-02-01 NOTE — Progress Notes (Addendum)
PROGRESS NOTE  Bonnie Henry ZTI:[id number] DOB: 1940/07/28 DOA: 01/29/2017 PCP: Arnetha Courser, MD  HPI/Recap of past 24 hours: Bonnie Henry is a 77 y.o. female with past medical history significant for chronic resp failure on 2L O2 continuously, anxiety, anemia, asthma, chronic heart failure, breast cancer, chronic atrial fibrillation, pulmonary nodules, depression with psychosis and type 2 diabetes presents the emergency room with altered mental status.  On presentation productive cough and cxr with RLL infiltrates vs atelectasis. Also pyuria, started in the ED on rocephin. Admitted due to acute metabolic encephalopathy 2/2 to UTI, poa vs others.  02/01/17: patient seen and examined with her daughter at her bedside. She is alert and oriented x 3. Reports she feels depressed. Psychiatry consulted to evaluate.  Assessment/Plan: Principal Problem:   Depression, major, recurrent, severe with psychosis ([place]) Active Problems:   Acute encephalopathy   Acute metabolic encephalopathy 2/2 to UTI, poa vs others, resolved CT head negative Urine cx no growth to date Continue to hold gabapentin, buspar TSH 11.22 continue levothyroxine 112 mcg  UTI, poa -continue IV antibiotics -continue IV fluid -monitor urine output  Uncontrolled depression -denies SI or HI -psych consulted -appreciate recommendations -at home on: abilify, depakote, cymbalta  Insulin depdt type 2 diabetes -A1C 8.7 (01/23/17) -ISS -avoid hypoglycemia -achs CBG  pseudoHyponatremia -Na+ 133, chemistry blood gluciose 238 -corrected Na+ 135 -treat hyperglycemia  Hyperglycemia -ISS -A1C 8.7 (01/23/17) -BMP am  Hypothyroidism -TSH 11.22 -levothyroxyne 112 mcg  Moderate to severe physical deconditioning/generalized weakness -PT evaluated and recommended short term rehab for physical therapy -continue PT  COPD/centrilobular emphysema  -Poss mild case of AE -Continue Prn albuterol -continue Sch  duonebs -continue pulmicort  HTN -BP is stable -Cont Norvasc, spironolactone   Hx of pulmoanry nodules (12/08/16) -noted  Chronic dCHF EF 60% (2016) -Cont coreg -continue lasix, spironolactone -strict I&Os, daily weights  Hyperlipidemia -Continue statin  Allergies -Cont flonase  CAD -Con prn ntg sl -continue asa, lipitor, carvedilol  GERD -stable -Cont PPI  RLS -stable -Cont requip   Code Status: full  Family Communication: none at bedside   Disposition Plan: will stay another midnight to continue IV ceftriaxone day#2  Severity: Moderate to severe due to multiple commorbidities and advanced age. High risk for decompensation.   Consultants:  psychiatry  Procedures:  none  Antimicrobials:  rocephin  DVT prophylaxis:  sq lovenox    Objective: Vitals:   02/01/17 0811 02/01/17 0842 02/01/17 1406 02/01/17 1418  BP: 136/65   (!) 153/61  Pulse: 73   73  Resp:    20  Temp:    (!) 97.4 F (36.3 C)  TempSrc:    Oral  SpO2:  94% 93% 94%  Weight:      Height:        Intake/Output Summary (Last 24 hours) at 02/01/2017 1657 Last data filed at 02/01/2017 1500 Gross per 24 hour  Intake 870 ml  Output -  Net 870 ml   Filed Weights   01/29/17 1734 01/30/17 0500  Weight: 90.7 kg (200 lb) 90.7 kg (200 lb)    Exam: 02/01/17 pt seen and examined at her bedside. Her physical exam is unchanged except for changes mentioned below.   General:  77 yo CF WD WN NAD A&O x 3  Cardiovascular: RRR no rubs or gallops  Respiratory: Mild diffused rales at bases bilaterally. No wheezes  Abdomen: soft NT ND NBS x4  Musculoskeletal: non focal no LE edema  Skin: no rash noted  Psychiatry: Mood appropriate for condition and setting   Data Reviewed: CBC: Recent Labs  Lab 01/29/17 1823 01/30/17 0246 01/31/17 0830  WBC 13.4* 11.4* 11.2*  NEUTROABS 10.1*  --   --   HGB 10.6* 10.4* 10.9*  HCT 33.5* 31.3* 34.7*  MCV 91.0 90.5 91.1  PLT 167 151 182     Basic Metabolic Panel: Recent Labs  Lab 01/29/17 1823 01/30/17 0246 01/31/17 0830  NA 136 141 133*  K 3.6 3.4* 4.6  CL 99* 101 96*  CO2 26 27 23   GLUCOSE 178* 93 238*  BUN 15 12 8   CREATININE 1.16* 1.09* 1.05*  CALCIUM 8.4* 8.4* 9.0   GFR: Estimated Creatinine Clearance: 50.2 mL/min (A) (by C-G formula based on SCr of 1.05 mg/dL (H)). Liver Function Tests: Recent Labs  Lab 01/29/17 1823  AST 25  ALT 14  ALKPHOS 151*  BILITOT 1.6*  PROT 7.4  ALBUMIN 3.6   No results for input(s): LIPASE, AMYLASE in the last 168 hours. No results for input(s): AMMONIA in the last 168 hours. Coagulation Profile: No results for input(s): INR, PROTIME in the last 168 hours. Cardiac Enzymes: No results for input(s): CKTOTAL, CKMB, CKMBINDEX, TROPONINI in the last 168 hours. BNP (last 3 results) No results for input(s): PROBNP in the last 8760 hours. HbA1C: No results for input(s): HGBA1C in the last 72 hours. CBG: Recent Labs  Lab 01/26/17 1739 02/01/17 1154  GLUCAP 163* 364*   Lipid Profile: No results for input(s): CHOL, HDL, LDLCALC, TRIG, CHOLHDL, LDLDIRECT in the last 72 hours. Thyroid Function Tests: Recent Labs    01/30/17 0246  TSH 11.222*   Anemia Panel: No results for input(s): VITAMINB12, FOLATE, FERRITIN, TIBC, IRON, RETICCTPCT in the last 72 hours. Urine analysis:    Component Value Date/Time   COLORURINE YELLOW 01/29/2017 1814   APPEARANCEUR CLEAR 01/29/2017 1814   APPEARANCEUR Cloudy (A) 02/20/2015 0948   LABSPEC 1.013 01/29/2017 1814   LABSPEC 1.004 09/01/2013 1134   PHURINE 6.0 01/29/2017 1814   GLUCOSEU 50 (A) 01/29/2017 1814   GLUCOSEU 50 mg/dL 09/01/2013 1134   HGBUR NEGATIVE 01/29/2017 1814   BILIRUBINUR NEGATIVE 01/29/2017 1814   BILIRUBINUR neg 05/02/2016 0821   BILIRUBINUR Negative 02/20/2015 0948   BILIRUBINUR Negative 09/01/2013 1134   KETONESUR NEGATIVE 01/29/2017 1814   PROTEINUR NEGATIVE 01/29/2017 1814   UROBILINOGEN 0.2 05/02/2016  0821   UROBILINOGEN 0.2 10/23/2014 1008   NITRITE NEGATIVE 01/29/2017 1814   LEUKOCYTESUR TRACE (A) 01/29/2017 1814   LEUKOCYTESUR 2+ (A) 02/20/2015 0948   LEUKOCYTESUR Negative 09/01/2013 1134   Sepsis Labs: @LABRCNTIP (procalcitonin:4,lacticidven:4)  ) Recent Results (from the past 240 hour(s))  Urine Culture     Status: None   Collection Time: 01/23/17 12:33 PM  Result Value Ref Range Status   MICRO NUMBER: [id number]  Final   SPECIMEN QUALITY: ADEQUATE  Final   Sample Source URINE  Final   STATUS: FINAL  Final   Result:   Final    Three or more organisms present, each greater than 10,000 cu/mL. May represent normal flora contamination from external genitalia. No further testing is required.  Urine culture     Status: None   Collection Time: 01/29/17  6:14 PM  Result Value Ref Range Status   Specimen Description URINE, CLEAN CATCH  Final   Special Requests NONE  Final   Culture NO GROWTH  Final   Report Status 01/31/2017 FINAL  Final      Studies: No results found.  Scheduled Meds: .  amLODipine  5 mg Oral Daily  . [START ON 02/02/2017] ARIPiprazole  2 mg Oral QHS  . aspirin EC  81 mg Oral Daily  . atorvastatin  80 mg Oral QHS  . budesonide  0.5 mg Nebulization BID  . carvedilol  12.5 mg Oral BID WC  . divalproex  250 mg Oral BID  . DULoxetine  60 mg Oral Daily  . enoxaparin (LOVENOX) injection  40 mg Subcutaneous Q24H  . insulin aspart  0-15 Units Subcutaneous TID WC  . insulin aspart  0-5 Units Subcutaneous QHS  . ipratropium-albuterol  3 mL Nebulization TID  . [START ON 02/02/2017] levothyroxine  100 mcg Oral Once per day on Mon Tue Wed Thu  . levothyroxine  112 mcg Oral Once per day on Sun Fri Sat  . pantoprazole  20 mg Oral BID  . rOPINIRole  1 mg Oral TID  . spironolactone  25 mg Oral Daily    Continuous Infusions: . sodium chloride    . cefTRIAXone (ROCEPHIN)  IV Stopped (01/31/17 2258)     LOS: 3 days     Kayleen Memos, MD [facility] Pager  [phone]  If 7PM-7AM, please contact night-coverage [email or web address] Password [facility] 02/01/2017, 4:57 PM

## 2017-02-01 NOTE — Consult Note (Signed)
[facility] Consult   Reason for Consult:  Uncontrolled depression Referring Physician:  Dr. Nevada Crane Patient Identification: Bonnie Henry MRN:  [id number] Principal Diagnosis: Depression, major, recurrent, severe with psychosis ([place]) Diagnosis:   Patient Active Problem List   Diagnosis Date Noted  . Acute encephalopathy [G93.40] 01/29/2017  . Abnormality of plasma protein [R77.9] 01/23/2017  . Dependent edema [R60.9] 12/22/2016  . Severe episode of recurrent major depressive disorder, with psychotic features ([place]) [F33.3] 12/11/2016  . Depression, major, recurrent, severe with psychosis ([place]) [F33.3] 12/09/2016  . Mild dementia [F03.90] 12/09/2016  . Aortic atherosclerosis ([place]) [I70.0] 12/09/2016  . Facet arthropathy, lumbar [M47.816] 11/21/2016  . Hyperlipidemia due to type 2 diabetes mellitus ([place]) [E11.69, E78.5] 11/03/2016  . Cognitive impairment [R41.89] 08/26/2016  . Multifactorial gait disorder [R26.89] 08/26/2016  . Diabetic polyneuropathy associated with diabetes mellitus due to underlying condition ([place]) [E08.42] 08/26/2016  . Osteoarthritis of right knee [M17.11] 07/23/2016  . Graves' disease with exophthalmos [E05.00] 05/29/2016  . Medication monitoring encounter [Z51.81] 05/26/2016  . Renal insufficiency [N28.9] 08/03/2015  . Anemia, iron deficiency [D50.9] 07/22/2015  . Metabolic encephalopathy [T88.82] 07/21/2015  . Chronic respiratory failure with hypoxia ([place]) [J96.11] 07/21/2015  . History of right-sided carotid endarterectomy [Z98.890] 06/10/2015  . Carotid stenosis, right [I65.21] 05/16/2015  . Low back pain [M54.5] 05/04/2015  . Chronic nonintractable headache [R51] 03/30/2015  . Memory loss, short term [R41.3] 03/30/2015  . Patellar subluxation [S83.003A] 03/29/2015  . Constipation [K59.00] 03/04/2015  . Lumbar stenosis with neurogenic claudication [M48.062] 02/23/2015  . DDD (degenerative disc disease), lumbar [M51.36] 02/23/2015  .  Cervical radiculitis [M54.12] 02/23/2015  . Encounter for medication monitoring [Z51.81] 02/20/2015  . Knee pain, bilateral [M25.561, M25.562] 02/08/2015  . Frequent falls [R29.6] 01/22/2015  . Diffuse cerebral atrophy [G31.9] 10/31/2014  . Chronic interstitial lung disease ([place]) [J84.9] 10/26/2014  . Centrilobular emphysema ([place]) [J43.2] 10/26/2014  . Vision blurred [H53.8] 09/28/2014  . Acquired exophthalmos [H05.20] 09/28/2014  . Diabetes mellitus with diabetic neuropathy ([place]) [E11.40] 08/31/2014  . Hammertoe [M20.40] 08/31/2014  . Foot callus [L84] 08/31/2014  . Shoulder girdle syndrome [G54.5] 07/06/2014  . Anemia [D64.9] 05/21/2014  . Chronic pain [G89.29] 03/02/2014  . Depression [F32.9] 03/02/2014  . Acid reflux [K21.9] 03/02/2014  . Glaucoma [H40.9] 03/02/2014  . Adult hypothyroidism [E03.9] 03/02/2014  . Apnea, sleep [G47.30] 03/02/2014  . PAT (paroxysmal atrial tachycardia) ([place]) [I47.1] 02/24/2014  . Unstable angina ([place]) [I20.0] 02/23/2014  . Type 2 diabetes with nephropathy ([place]) [E11.21] 02/23/2014  . HTN (hypertension) [I10] 02/23/2014  . CAD S/P remote PCI  [I25.10, Z98.61] 02/23/2014  . High cholesterol [E78.00] 02/23/2014  . PVD- h/o LCEA [I73.9] 02/23/2014  . Morbid obesity ([place]) [E66.01] 02/23/2014  . Aortic stenosis murmur [I35.0] 02/23/2014  . COPD, moderate ([place]) [J44.9] 07/27/2013    Total Time spent with patient: 45 minutes  Subjective:   Bonnie Henry is a 77 y.o. female patient admitted with urinary tract infection.  HPI: Patient with history of Chronic resp failure on 2L O2 continuously,anxiety, anemia, asthma, chronic heart failure, breast cancer, chronic atrial fibrillation, pulmonary nodules, depression with psychosis and type 2 diabetes presents the emergency room with altered mental status. Patient is a poor historian due to cognitive impairement and as a result most of the history was obtained from her daughter who takes care of her.  Daughter reports that her mother has having excessive daytime tiredness, low energy level and nauseous since she was placed on Abilify. She also reports that  Depakote has not been helping reduce patient's agitation. She has not observed psychosis or delusions in the last few days. Daughter is requesting for psych medication adjustment.  Past Psychiatric History: as above  Risk to Self: Suicidal Ideation: No Suicidal Intent: No Is patient at risk for suicide?: No Suicidal Plan?: No Access to Means: No What has been your use of drugs/alcohol within the last 12 months?: none How many times?: 0 Other Self Harm Risks: none Triggers for Past Attempts: None known Intentional Self Injurious Behavior: None Risk to Others: Homicidal Ideation: No Thoughts of Harm to Others: No Current Homicidal Intent: No Current Homicidal Plan: No Access to Homicidal Means: No Identified Victim: NA History of harm to others?: No Assessment of Violence: None Noted Violent Behavior Description: none Does patient have access to weapons?: No Criminal Charges Pending?: No Does patient have a court date: No Prior Inpatient Therapy: Prior Inpatient Therapy: Yes Prior Therapy Dates: 2001, December 2018 Prior Therapy Facilty/Provider(s): [facility] Reason for Treatment: AMS depression Prior Outpatient Therapy: Prior Outpatient Therapy: Yes Prior Therapy Dates: current Prior Therapy Facilty/Provider(s): unknown  Reason for Treatment: AMS Does patient have an ACCT team?: No Does patient have Intensive In-House Services?  : No Does patient have [facility] services? : No Does patient have P4CC services?: No  Past Medical History:  Past Medical History:  Diagnosis Date  . Anemia   . Anxiety   . Aortic atherosclerosis ([place]) 12/09/2016   Chest CT Dec 2018  . Arthritis    "joints ache all over" . osteoarthritis  . Asthma   . Atrial fibrillation ([place])   . Breast cancer, left breast (HCC)    S/P mastectomy  . CAD  (coronary artery disease)   . CHF (congestive heart failure) ([place])   . Chronic back pain   . Constipation 03/04/2015  . COPD (chronic obstructive pulmonary disease) ([place])   . Depression, major   . Diabetes mellitus without complication ([place])   . DVT (deep venous thrombosis) ([place])   . GERD (gastroesophageal reflux disease)   . Headache   . Heart murmur   . Hepatitis    HEP "C"  . History of blood transfusion    "when I had my mastectomy"  . History of hiatal hernia   . History of right-sided carotid endarterectomy 06/10/2015  . Hypercholesteremia   . Hypertension   . Morbid obesity ([place])   . Myocardial infarction ([place])   . Neuropathy   . Peripheral vascular disease ([place])   . Post-surgical hypothyroidism   . Restless leg syndrome   . Shortness of breath dyspnea   . Sleep apnea    "2015 wore mask; lost weight; told didn't need mask anymore" (02/23/2014)  . Type II diabetes mellitus ([place])     Past Surgical History:  Procedure Laterality Date  . ARTERY BIOPSY Right 09/29/2014   Procedure: BIOPSY TEMPORAL ARTERY;  Surgeon: Angelia Mould, MD;  Location: [facility];  Service: Vascular;  Laterality: Right;  . BARTHOLIN CYST MARSUPIALIZATION    . BREAST BIOPSY Left    Mastectomy  . CARDIAC CATHETERIZATION  2000's  . CAROTID ENDARTERECTOMY Left 2009  . CATARACT EXTRACTION W/ INTRAOCULAR LENS  IMPLANT, BILATERAL Bilateral ~ 2011  . CORONARY ANGIOPLASTY WITH STENT PLACEMENT  1990's X 2   "2; 1"  . ENDARTERECTOMY Right 05/16/2015   Procedure: ENDARTERECTOMY CAROTID;  Surgeon: Katha Cabal, MD;  Location: [facility];  Service: Vascular;  Laterality: Right;  . ESOPHAGOGASTRODUODENOSCOPY (EGD) WITH PROPOFOL N/A 12/12/2014  Procedure: ESOPHAGOGASTRODUODENOSCOPY (EGD) WITH PROPOFOL;  Surgeon: Lucilla Lame, MD;  Location: [facility];  Service: Endoscopy;  Laterality: N/A;  . EYE SURGERY Bilateral    Catract Extraction with IOL  . HAMMER TOE SURGERY Left    2nd and 3rd digits  . JOINT  REPLACEMENT Bilateral    Total Hip Replacement  . JOINT REPLACEMENT Left    Total Knee Replacement  . LEFT HEART CATHETERIZATION WITH CORONARY ANGIOGRAM N/A 02/24/2014   Procedure: LEFT HEART CATHETERIZATION WITH CORONARY ANGIOGRAM;  Surgeon: Leonie Man, MD;  Location: [facility] CATH [facility];  Service: Cardiovascular;  Laterality: N/A;  . MASTECTOMY Left 1990's  . SHOULDER SURGERY Right    "cleaned it out; no scope"  . THYROIDECTOMY, PARTIAL  1950's  . TOE SURGERY Right    "cut piece of bone out so toe didn't dig into my foot"  . TONSILLECTOMY  1950's  . TOTAL HIP ARTHROPLASTY Bilateral 1990's  . TOTAL KNEE ARTHROPLASTY Left 1990's  . TRIGGER FINGER RELEASE Left   . TUBAL LIGATION    . VAGINAL HYSTERECTOMY     "partial"   Family History:  Family History  Problem Relation Age of Onset  . Arthritis Mother   . Cancer Mother   . Diabetes Mother   . Heart disease Mother   . Hyperlipidemia Mother   . Hypertension Mother   . Alcohol abuse Father   . Brain cancer Father   . Arthritis Maternal Grandmother   . Alzheimer's disease Maternal Grandmother   . Diabetes Daughter   . Hyperlipidemia Daughter   . Hypertension Daughter   . Thyroid cancer Daughter   . Diabetes Son   . Diabetes Daughter   . Hypertension Daughter   . Diabetes Son   . Hypertension Son   . Hyperlipidemia Son   . Thyroid cancer Son   . Diabetes Brother   . Diabetes Brother    Family Psychiatric  History:  Social History:  Social History   Substance and Sexual Activity  Alcohol Use No  . Alcohol/week: 0.0 oz   Comment: 02/23/2014 "no alcohol since 1990's"     Social History   Substance and Sexual Activity  Drug Use No    Social History   Socioeconomic History  . Marital status: Widowed    Spouse name: Not on file  . Number of children: Not on file  . Years of education: Not on file  . Highest education level: Not on file  Social Needs  . Financial resource strain: Not on file  . Food insecurity -  worry: Not on file  . Food insecurity - inability: Not on file  . Transportation needs - medical: Not on file  . Transportation needs - non-medical: Not on file  Occupational History  . Not on file  Tobacco Use  . Smoking status: Former Smoker    Packs/day: 1.50    Years: 44.00    Pack years: 66.00    Types: Cigarettes    Last attempt to quit: 01/06/1994    Years since quitting: 23.0  . Smokeless tobacco: Never Used  . Tobacco comment: "quit smoking cigarettes in ~ 2000"  Substance and Sexual Activity  . Alcohol use: No    Alcohol/week: 0.0 oz    Comment: 02/23/2014 "no alcohol since 1990's"  . Drug use: No  . Sexual activity: No  Other Topics Concern  . Not on file  Social History Narrative   Lives with daughter in a mobile home.  Has 4 children  alive, 1 deceased.  Retired from [employer].  Education: high school.   Additional Social History:    Allergies:   Allergies  Allergen Reactions  . Vasotec [Enalapril] Swelling    Throat swells  . Risperidone Rash  . Codeine Nausea And Vomiting  . Morphine And Related Nausea And Vomiting    Labs:  Results for orders placed or performed during the hospital encounter of 01/29/17 (from the past 48 hour(s))  CBC     Status: Abnormal   Collection Time: 01/31/17  8:30 AM  Result Value Ref Range   WBC 11.2 (H) 4.0 - 10.5 K/uL   RBC 3.81 (L) 3.87 - 5.11 MIL/uL   Hemoglobin 10.9 (L) 12.0 - 15.0 g/dL   HCT 34.7 (L) 36.0 - 46.0 %   MCV 91.1 78.0 - 100.0 fL   MCH 28.6 26.0 - 34.0 pg   MCHC 31.4 30.0 - 36.0 g/dL   RDW 14.7 11.5 - 15.5 %   Platelets 182 150 - 400 K/uL  Basic metabolic panel     Status: Abnormal   Collection Time: 01/31/17  8:30 AM  Result Value Ref Range   Sodium 133 (L) 135 - 145 mmol/L    Comment: DELTA CHECK NOTED   Potassium 4.6 3.5 - 5.1 mmol/L    Comment: DELTA CHECK NOTED   Chloride 96 (L) 101 - 111 mmol/L   CO2 23 22 - 32 mmol/L   Glucose, Bld 238 (H) 65 - 99 mg/dL   BUN 8 6 - 20 mg/dL   Creatinine, Ser  1.05 (H) 0.44 - 1.00 mg/dL   Calcium 9.0 8.9 - 10.3 mg/dL   GFR calc non Af Amer 51 (L) >60 mL/min   GFR calc Af Amer 59 (L) >60 mL/min    Comment: (NOTE) The eGFR has been calculated using the CKD EPI equation. This calculation has not been validated in all clinical situations. eGFR's persistently <60 mL/min signify possible Chronic Kidney Disease.    Anion gap 14 5 - 15  Glucose, capillary     Status: Abnormal   Collection Time: 02/01/17 11:54 AM  Result Value Ref Range   Glucose-Capillary 364 (H) 65 - 99 mg/dL    Current Facility-Administered Medications  Medication Dose Route Frequency Provider Last Rate Last Dose  . 0.9 %  sodium chloride infusion   Intravenous Continuous Irene Pap N, DO      . acetaminophen (TYLENOL) tablet 650 mg  650 mg Oral Q6H PRN Elwin Mocha, MD       Or  . acetaminophen (TYLENOL) suppository 650 mg  650 mg Rectal Q6H PRN Elwin Mocha, MD      . albuterol (PROVENTIL) (2.5 MG/3ML) 0.083% nebulizer solution 0.63 mg  0.63 mg Nebulization Q6H PRN Elwin Mocha, MD      . amLODipine (NORVASC) tablet 5 mg  5 mg Oral Daily Elwin Mocha, MD   5 mg at 02/01/17 3532  . [START ON 02/02/2017] ARIPiprazole (ABILIFY) tablet 2 mg  2 mg Oral QHS Kymere Fullington, MD      . aspirin EC tablet 81 mg  81 mg Oral Daily Elwin Mocha, MD   81 mg at 02/01/17 9924  . atorvastatin (LIPITOR) tablet 80 mg  80 mg Oral QHS Elwin Mocha, MD   80 mg at 01/31/17 2144  . budesonide (PULMICORT) nebulizer solution 0.5 mg  0.5 mg Nebulization BID Elwin Mocha, MD   0.5 mg at 02/01/17 2683  . carvedilol (COREG)  tablet 12.5 mg  12.5 mg Oral BID WC Elwin Mocha, MD   12.5 mg at 02/01/17 0813  . cefTRIAXone (ROCEPHIN) 1 g in dextrose 5 % 50 mL IVPB  1 g Intravenous Q24H Laren Everts, RPH   Stopped at 01/31/17 2258  . diphenhydrAMINE-zinc acetate (BENADRYL) 2-0.1 % cream   Topical Daily PRN Irene Pap N, DO      . divalproex (DEPAKOTE ER) 24 hr tablet 250 mg   250 mg Oral BID Rubina Basinski, MD      . DULoxetine (CYMBALTA) DR capsule 60 mg  60 mg Oral Daily Elwin Mocha, MD   60 mg at 02/01/17 4627  . enoxaparin (LOVENOX) injection 40 mg  40 mg Subcutaneous Q24H Elwin Mocha, MD   40 mg at 02/01/17 0816  . fluticasone (FLONASE) 50 MCG/ACT nasal spray 2 spray  2 spray Each Nare PRN Elwin Mocha, MD      . insulin aspart (novoLOG) injection 0-15 Units  0-15 Units Subcutaneous TID WC Kayleen Memos, DO   15 Units at 02/01/17 1208  . insulin aspart (novoLOG) injection 0-5 Units  0-5 Units Subcutaneous QHS Hall, Carole N, DO      . ipratropium-albuterol (DUONEB) 0.5-2.5 (3) MG/3ML nebulizer solution 3 mL  3 mL Nebulization TID Elwin Mocha, MD   3 mL at 02/01/17 0842  . [START ON 02/02/2017] levothyroxine (SYNTHROID, LEVOTHROID) tablet 100 mcg  100 mcg Oral Once per day on Mon Tue Wed Thu Hobbs, Phillip M, MD      . levothyroxine Wilmer Floor, LEVOTHROID) tablet 112 mcg  112 mcg Oral Once per day on Sun Fri Sat Kayleen Memos, DO   112 mcg at 02/01/17 0350  . nitroGLYCERIN (NITROSTAT) SL tablet 0.4 mg  0.4 mg Sublingual Q5 min PRN Elwin Mocha, MD      . ondansetron [facility]) tablet 4 mg  4 mg Oral Q6H PRN Elwin Mocha, MD       Or  . ondansetron [facility]) injection 4 mg  4 mg Intravenous Q6H PRN Elwin Mocha, MD      . pantoprazole (PROTONIX) EC tablet 20 mg  20 mg Oral BID Elwin Mocha, MD   20 mg at 02/01/17 0938  . rOPINIRole (REQUIP) tablet 1 mg  1 mg Oral TID Elwin Mocha, MD   1 mg at 02/01/17 1829  . spironolactone (ALDACTONE) tablet 25 mg  25 mg Oral Daily Elwin Mocha, MD   25 mg at 02/01/17 9371    Musculoskeletal: Strength & Muscle Tone: within normal limits Gait & Station: unsteady Patient leans: Front  Psychiatric Specialty Exam: Physical Exam  Psychiatric: Her speech is normal. Judgment and thought content normal. Her mood appears anxious. She is agitated. She exhibits a depressed mood.    Review of  Systems  Psychiatric/Behavioral: Positive for depression. The patient is nervous/anxious.     Blood pressure 136/65, pulse 73, temperature 99.9 F (37.7 C), temperature source Oral, resp. rate 17, height 5' 3"  (1.6 m), weight 90.7 kg (200 lb), SpO2 94 %.Body mass index is 35.43 kg/m.  General Appearance: Casual  Eye Contact:  Fair  Speech:  Clear and Coherent  Volume:  Normal  Mood:  Anxious and Dysphoric  Affect:  Constricted  Thought Process:  Coherent  Orientation:  Other:  only to person and place  Thought Content:  Logical  Suicidal Thoughts:  No  Homicidal Thoughts:  No  Memory:  Immediate;  Good Recent;   Poor Remote;   Fair  Judgement:  Intact  Insight:  Fair  Psychomotor Activity:  Psychomotor Retardation  Concentration:  Concentration: Fair and Attention Span: Fair  Recall:  [employer] of Knowledge:  Fair  Language:  Good  Akathisia:  No  Handed:  Right  AIMS (if indicated):     Assets:  Communication Skills Desire for Improvement Social Support  ADL's:  marginal  Cognition:  Impaired,  Mild  Sleep:   fair     Treatment Plan Summary: Plan/Recomendations: -Continue Cymbalta 60 mg daily for depression -Change Abilify 65m daily to bedtime due to excessive daytime sedation -Increased Depakote 2576mto twice daily to address agitation/aggression  Disposition: No evidence of imminent risk to self or others at present.   Patient does not meet criteria for psychiatric inpatient admission. Supportive therapy provided about ongoing stressors. Unit social worker to schedule patient for outpatient psychiatric appointment upon discharge  AkCorena PilgrimMD 02/01/2017 1:50 PM

## 2017-02-02 ENCOUNTER — Other Ambulatory Visit: Payer: Self-pay | Admitting: Family Medicine

## 2017-02-02 ENCOUNTER — Encounter (HOSPITAL_COMMUNITY): Payer: Self-pay

## 2017-02-02 DIAGNOSIS — F29 Unspecified psychosis not due to a substance or known physiological condition: Secondary | ICD-10-CM

## 2017-02-02 DIAGNOSIS — F5101 Primary insomnia: Secondary | ICD-10-CM

## 2017-02-02 DIAGNOSIS — E1159 Type 2 diabetes mellitus with other circulatory complications: Secondary | ICD-10-CM

## 2017-02-02 DIAGNOSIS — R779 Abnormality of plasma protein, unspecified: Secondary | ICD-10-CM

## 2017-02-02 DIAGNOSIS — F329 Major depressive disorder, single episode, unspecified: Secondary | ICD-10-CM

## 2017-02-02 DIAGNOSIS — Z794 Long term (current) use of insulin: Secondary | ICD-10-CM

## 2017-02-02 LAB — BASIC METABOLIC PANEL
ANION GAP: 14 (ref 5–15)
BUN: 14 mg/dL (ref 6–20)
CO2: 25 mmol/L (ref 22–32)
Calcium: 9.2 mg/dL (ref 8.9–10.3)
Chloride: 95 mmol/L — ABNORMAL LOW (ref 101–111)
Creatinine, Ser: 1.14 mg/dL — ABNORMAL HIGH (ref 0.44–1.00)
GFR calc Af Amer: 54 mL/min — ABNORMAL LOW (ref >60)
GFR calc non Af Amer: 46 mL/min — ABNORMAL LOW (ref >60)
GLUCOSE: 269 mg/dL — AB (ref 65–99)
POTASSIUM: 3.9 mmol/L (ref 3.5–5.1)
Sodium: 134 mmol/L — ABNORMAL LOW (ref 135–145)

## 2017-02-02 LAB — GLUCOSE, CAPILLARY
GLUCOSE-CAPILLARY: 174 mg/dL — AB (ref 65–99)
Glucose-Capillary: 258 mg/dL — ABNORMAL HIGH (ref 65–99)
Glucose-Capillary: 276 mg/dL — ABNORMAL HIGH (ref 65–99)
Glucose-Capillary: 185 mg/dL — ABNORMAL HIGH (ref 65–99)

## 2017-02-02 MED ORDER — INSULIN NPH (HUMAN) (ISOPHANE) 100 UNIT/ML ~~LOC~~ SUSP
24.0000 [IU] | Freq: Every day | SUBCUTANEOUS | Status: DC
  Administered 2017-02-02: 24 [IU] via SUBCUTANEOUS
  Filled 2017-02-02: qty 10

## 2017-02-02 MED ORDER — METOCLOPRAMIDE HCL 10 MG PO TABS
5.0000 mg | ORAL_TABLET | Freq: Three times a day (TID) | ORAL | Status: DC
  Administered 2017-02-02 – 2017-02-03 (×3): 5 mg via ORAL
  Filled 2017-02-02 (×4): qty 1

## 2017-02-02 MED ORDER — INSULIN NPH (HUMAN) (ISOPHANE) 100 UNIT/ML ~~LOC~~ SUSP: 24.0000 [IU] | Freq: Every day | SUBCUTANEOUS | Status: DC

## 2017-02-02 NOTE — Progress Notes (Addendum)
PROGRESS NOTE  IASHA MCCALISTER PNT:[id number] DOB: March 26, 1940 DOA: 01/29/2017 PCP: Arnetha Courser, MD  HPI/Recap of past 24 hours: Bonnie Henry is a 77 y.o. female with past medical history significant for chronic resp failure on 2L O2 continuously, anxiety, anemia, asthma, chronic heart failure, breast cancer, chronic atrial fibrillation, pulmonary nodules, depression with psychosis and type 2 diabetes presents the emergency room with altered mental status.  On presentation productive cough and cxr with RLL infiltrates vs atelectasis. Also pyuria, started in the ED on rocephin. Admitted due to acute metabolic encephalopathy 2/2 to UTI, poa vs others. 02/01/17: Reports she feels depressed. Psychiatry consulted to evaluate.  02/02/17: seen and examined with her daughter at her beside. No new complaints.  Assessment/Plan: Principal Problem:   Depression, major, recurrent, severe with psychosis ([place]) Active Problems:   Acute encephalopathy   Acute metabolic encephalopathy 2/2 to UTI, poa vs others, resolved CT head negative Urine cx no growth to date Continue to hold gabapentin, buspar TSH 11.22 continue levothyroxine 112 mcg  UTI, poa -continue IV antibiotics -continue IV fluid -monitor urine output  Intractable nausea suspect diabetes gastroparesis -reglan  Self reported dysphagia with solids -speech therapist for swallow eval  Uncontrolled depression -denies SI or HI -psych consulted -appreciate recommendations -at home on: abilify, depakote, cymbalta  Insulin depdt type 2 diabetes -A1C 8.7 (01/23/17) -ISS -avoid hypoglycemia -achs CBG  pseudoHyponatremia -Na+ 133, chemistry blood gluciose 238 -corrected Na+ 135 -treat hyperglycemia  Hyperglycemia -ISS -A1C 8.7 (01/23/17) -BMP am  Hypothyroidism -TSH 11.22 -levothyroxyne 112 mcg  Moderate to severe physical deconditioning/generalized weakness -PT evaluated and recommended short term rehab for  physical therapy -continue PT  COPD/centrilobular emphysema  -Poss mild case of AE -Continue Prn albuterol -continue Sch duonebs -continue pulmicort  HTN -BP is stable -Cont Norvasc, spironolactone   Hx of pulmoanry nodules (12/08/16) -noted  Chronic dCHF EF 60% (2016) -Cont coreg -continue lasix, spironolactone -strict I&Os, daily weights  Hyperlipidemia -Continue statin  Allergies -Cont flonase  CAD -Con prn ntg sl -continue asa, lipitor, carvedilol  GERD -stable -Cont PPI  RLS -stable -Cont requip   Code Status: full  Family Communication: none at bedside   Disposition Plan: will stay another midnight to continue IV ceftriaxone day#3  Severity: Moderate to severe due to multiple commorbidities and advanced age. High risk for decompensation.   Consultants:  psychiatry  Procedures:  none  Antimicrobials:  rocephin  DVT prophylaxis:  sq lovenox    Objective: Vitals:   02/02/17 0542 02/02/17 0858 02/02/17 1320 02/02/17 1430  BP: (!) 147/53  (!) 126/51   Pulse: 83 83 70   Resp:  20 18   Temp:   98.6 F (37 C)   TempSrc:   Oral   SpO2: 97% 97% 93% 98%  Weight:      Height:        Intake/Output Summary (Last 24 hours) at 02/02/2017 1525 Last data filed at 02/02/2017 1320 Gross per 24 hour  Intake 1120 ml  Output -  Net 1120 ml   Filed Weights   01/29/17 1734 01/30/17 0500  Weight: 90.7 kg (200 lb) 90.7 kg (200 lb)    Exam: 02/02/17 pt seen and examined at her bedside. Her physical exam is unchanged except for changes mentioned below.   General:  77 yo CF WD WN NAD A&O x 3  Cardiovascular: RRR no rubs or gallops  Respiratory: Mild diffused rales at bases bilaterally. No wheezes  Abdomen: soft NT ND NBS  x4  Musculoskeletal: non focal no LE edema  Skin: no rash noted  Psychiatry: Mood appropriate for condition and setting   Data Reviewed: CBC: Recent Labs  Lab 01/29/17 1823 01/30/17 0246 01/31/17 0830    WBC 13.4* 11.4* 11.2*  NEUTROABS 10.1*  --   --   HGB 10.6* 10.4* 10.9*  HCT 33.5* 31.3* 34.7*  MCV 91.0 90.5 91.1  PLT 167 151 938   Basic Metabolic Panel: Recent Labs  Lab 01/29/17 1823 01/30/17 0246 01/31/17 0830 02/02/17 0901  NA 136 141 133* 134*  K 3.6 3.4* 4.6 3.9  CL 99* 101 96* 95*  CO2 26 27 23 25   GLUCOSE 178* 93 238* 269*  BUN 15 12 8 14   CREATININE 1.16* 1.09* 1.05* 1.14*  CALCIUM 8.4* 8.4* 9.0 9.2   GFR: Estimated Creatinine Clearance: 46.3 mL/min (A) (by C-G formula based on SCr of 1.14 mg/dL (H)). Liver Function Tests: Recent Labs  Lab 01/29/17 1823  AST 25  ALT 14  ALKPHOS 151*  BILITOT 1.6*  PROT 7.4  ALBUMIN 3.6   No results for input(s): LIPASE, AMYLASE in the last 168 hours. No results for input(s): AMMONIA in the last 168 hours. Coagulation Profile: No results for input(s): INR, PROTIME in the last 168 hours. Cardiac Enzymes: No results for input(s): CKTOTAL, CKMB, CKMBINDEX, TROPONINI in the last 168 hours. BNP (last 3 results) No results for input(s): PROBNP in the last 8760 hours. HbA1C: No results for input(s): HGBA1C in the last 72 hours. CBG: Recent Labs  Lab 02/01/17 1154 02/01/17 1715 02/01/17 2040 02/02/17 0746 02/02/17 1204  GLUCAP 364* 192* 277* 258* 276*   Lipid Profile: No results for input(s): CHOL, HDL, LDLCALC, TRIG, CHOLHDL, LDLDIRECT in the last 72 hours. Thyroid Function Tests: No results for input(s): TSH, T4TOTAL, FREET4, T3FREE, THYROIDAB in the last 72 hours. Anemia Panel: No results for input(s): VITAMINB12, FOLATE, FERRITIN, TIBC, IRON, RETICCTPCT in the last 72 hours. Urine analysis:    Component Value Date/Time   COLORURINE YELLOW 01/29/2017 1814   APPEARANCEUR CLEAR 01/29/2017 1814   APPEARANCEUR Cloudy (A) 02/20/2015 0948   LABSPEC 1.013 01/29/2017 1814   LABSPEC 1.004 09/01/2013 1134   PHURINE 6.0 01/29/2017 1814   GLUCOSEU 50 (A) 01/29/2017 1814   GLUCOSEU 50 mg/dL 09/01/2013 1134   HGBUR  NEGATIVE 01/29/2017 1814   BILIRUBINUR NEGATIVE 01/29/2017 1814   BILIRUBINUR neg 05/02/2016 0821   BILIRUBINUR Negative 02/20/2015 0948   BILIRUBINUR Negative 09/01/2013 1134   KETONESUR NEGATIVE 01/29/2017 1814   PROTEINUR NEGATIVE 01/29/2017 1814   UROBILINOGEN 0.2 05/02/2016 0821   UROBILINOGEN 0.2 10/23/2014 1008   NITRITE NEGATIVE 01/29/2017 1814   LEUKOCYTESUR TRACE (A) 01/29/2017 1814   LEUKOCYTESUR 2+ (A) 02/20/2015 0948   LEUKOCYTESUR Negative 09/01/2013 1134   Sepsis Labs: @LABRCNTIP (procalcitonin:4,lacticidven:4)  ) Recent Results (from the past 240 hour(s))  Urine culture     Status: None   Collection Time: 01/29/17  6:14 PM  Result Value Ref Range Status   Specimen Description URINE, CLEAN CATCH  Final   Special Requests NONE  Final   Culture NO GROWTH  Final   Report Status 01/31/2017 FINAL  Final      Studies: No results found.  Scheduled Meds: . amLODipine  5 mg Oral Daily  . ARIPiprazole  2 mg Oral QHS  . aspirin EC  81 mg Oral Daily  . atorvastatin  80 mg Oral QHS  . budesonide  0.5 mg Nebulization BID  . carvedilol  12.5 mg  Oral BID WC  . divalproex  250 mg Oral BID  . DULoxetine  60 mg Oral Daily  . enoxaparin (LOVENOX) injection  40 mg Subcutaneous Q24H  . insulin aspart  0-15 Units Subcutaneous TID WC  . insulin aspart  0-5 Units Subcutaneous QHS  . insulin NPH Human  24 Units Subcutaneous QHS  . ipratropium-albuterol  3 mL Nebulization TID  . levothyroxine  100 mcg Oral Once per day on Mon Tue Wed Thu  . levothyroxine  112 mcg Oral Once per day on Sun Fri Sat  . pantoprazole  20 mg Oral BID  . rOPINIRole  1 mg Oral TID  . spironolactone  25 mg Oral Daily    Continuous Infusions: . sodium chloride    . cefTRIAXone (ROCEPHIN)  IV Stopped (02/01/17 2146)     LOS: 4 days     Kayleen Memos, MD [facility] Pager [phone]  If 7PM-7AM, please contact night-coverage [email or web address] Password [facility] 02/02/2017, 3:25 PM

## 2017-02-02 NOTE — Progress Notes (Signed)
CSW submitted patient's pasrr for review. Hopefully will be received by tomorrow for dc to SNF.  Percell Locus Taeshaun Rames LCSW [phone]

## 2017-02-02 NOTE — NC FL2 (Signed)
[address] LEVEL OF CARE SCREENING TOOL     IDENTIFICATION  Patient Name: Bonnie Henry Birthdate: December 24, 1940 Sex: female Admission Date (Current Location): 01/29/2017  [facility] and [place] Number:  Herbalist and Address:  [facility] [place]. [facility] [place] [address] [place], [place] [address]      Provider Number: [id number]  Attending Physician Name and Address:  Kayleen Memos, DO  Relative Name and Phone Number:  Inez Catalina daughter, [phone]    Current Level of Care: Hospital Recommended Level of Care: [place] Prior Approval Number:    Date Approved/Denied:   PASRR Number:    Discharge Plan: SNF    Current Diagnoses: Patient Active Problem List   Diagnosis Date Noted  . Acute encephalopathy 01/29/2017  . Abnormality of plasma protein 01/23/2017  . Dependent edema 12/22/2016  . Severe episode of recurrent major depressive disorder, with psychotic features ([place]) 12/11/2016  . Depression, major, recurrent, severe with psychosis ([place]) 12/09/2016  . Mild dementia 12/09/2016  . Aortic atherosclerosis ([place]) 12/09/2016  . Facet arthropathy, lumbar 11/21/2016  . Hyperlipidemia due to type 2 diabetes mellitus ([place]) 11/03/2016  . Cognitive impairment 08/26/2016  . Multifactorial gait disorder 08/26/2016  . Diabetic polyneuropathy associated with diabetes mellitus due to underlying condition ([place]) 08/26/2016  . Osteoarthritis of right knee 07/23/2016  . Graves' disease with exophthalmos 05/29/2016  . Medication monitoring encounter 05/26/2016  . Renal insufficiency 08/03/2015  . Anemia, iron deficiency 07/22/2015  . Metabolic encephalopathy 93/79/0240  . Chronic respiratory failure with hypoxia ([place]) 07/21/2015  . History of right-sided carotid endarterectomy 06/10/2015  . Carotid stenosis, right 05/16/2015  . Low back pain 05/04/2015  . Chronic nonintractable headache 03/30/2015  . Memory loss, short term  03/30/2015  . Patellar subluxation 03/29/2015  . Constipation 03/04/2015  . Lumbar stenosis with neurogenic claudication 02/23/2015  . DDD (degenerative disc disease), lumbar 02/23/2015  . Cervical radiculitis 02/23/2015  . Encounter for medication monitoring 02/20/2015  . Knee pain, bilateral 02/08/2015  . Frequent falls 01/22/2015  . 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 Chronic interstitial lung disease ([place]) 10/26/2014  . Centrilobular emphysema ([place]) 10/26/2014  . Vision blurred 09/28/2014  . Acquired exophthalmos 09/28/2014  . Diabetes mellitus with diabetic neuropathy ([place]) 08/31/2014  . Hammertoe 08/31/2014  . Foot callus 08/31/2014  . 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 Anemia 05/21/2014  . Chronic pain 03/02/2014  . Depression 03/02/2014  . Acid reflux 03/02/2014  . Glaucoma 03/02/2014  . Adult hypothyroidism 03/02/2014  . Apnea, sleep 03/02/2014  . PAT (paroxysmal atrial tachycardia) ([place]) 02/24/2014  . Unstable angina ([place]) 02/23/2014  . Type 2 diabetes with nephropathy ([place]) 02/23/2014  . HTN (hypertension) 02/23/2014  . CAD S/P remote PCI  02/23/2014  . High cholesterol 02/23/2014  . PVD- h/o LCEA 02/23/2014  . Morbid obesity ([place]) 02/23/2014  . Aortic stenosis murmur 02/23/2014  . COPD, moderate ([place]) 07/27/2013    Orientation RESPIRATION BLADDER Height & Weight     Self, Situation, Place  O2(Nasal cannula 2L) Continent Weight: 90.7 kg (200 lb) Height:  5\' 3"  (160 cm)  BEHAVIORAL SYMPTOMS/MOOD NEUROLOGICAL BOWEL NUTRITION STATUS      Continent Diet(Please see DC Summary)  AMBULATORY STATUS COMMUNICATION OF NEEDS Skin   Limited Assist Verbally Normal                       Personal Care Assistance Level of Assistance  Bathing, Feeding, Dressing Bathing Assistance: Limited  assistance Feeding assistance: Independent Dressing Assistance: Limited assistance     Functional Limitations Info             SPECIAL CARE FACTORS FREQUENCY  PT  (By licensed PT)     PT Frequency: 5x/week              Contractures      Additional Factors Info  Code Status, Allergies, Psychotropic, Insulin Sliding Scale Code Status Info: Full Allergies Info: Vasotec Enalapril, Risperidone, Codeine, Morphine And Related Psychotropic Info: Abilify; Cymbalta Insulin Sliding Scale Info: 3x daily with meals and at bedtime       Current Medications (02/02/2017):  This is the current hospital active medication list Current Facility-Administered Medications  Medication Dose Route Frequency Provider Last Rate Last Dose  . 0.9 %  sodium chloride infusion   Intravenous Continuous Irene Pap N, DO      . acetaminophen (TYLENOL) tablet 650 mg  650 mg Oral Q6H PRN Elwin Mocha, MD   650 mg at 02/01/17 1738   Or  . acetaminophen (TYLENOL) suppository 650 mg  650 mg Rectal Q6H PRN Elwin Mocha, MD      . albuterol (PROVENTIL) (2.5 MG/3ML) 0.083% nebulizer solution 0.63 mg  0.63 mg Nebulization Q6H PRN Elwin Mocha, MD      . amLODipine (NORVASC) tablet 5 mg  5 mg Oral Daily Elwin Mocha, MD   5 mg at 02/01/17 6712  . ARIPiprazole (ABILIFY) tablet 2 mg  2 mg Oral QHS Akintayo, Mojeed, MD      . aspirin EC tablet 81 mg  81 mg Oral Daily Elwin Mocha, MD   81 mg at 02/01/17 4580  . atorvastatin (LIPITOR) tablet 80 mg  80 mg Oral QHS Elwin Mocha, MD   80 mg at 02/01/17 2114  . budesonide (PULMICORT) nebulizer solution 0.5 mg  0.5 mg Nebulization BID Elwin Mocha, MD   0.5 mg at 02/01/17 1921  . carvedilol (COREG) tablet 12.5 mg  12.5 mg Oral BID WC Elwin Mocha, MD   12.5 mg at 02/02/17 0757  . cefTRIAXone (ROCEPHIN) 1 g in dextrose 5 % 50 mL IVPB  1 g Intravenous Q24H Laren Everts, RPH   Stopped at 02/01/17 2146  . diphenhydrAMINE-zinc acetate (BENADRYL) 2-0.1 % cream   Topical Daily PRN Irene Pap N, DO      . divalproex (DEPAKOTE ER) 24 hr tablet 250 mg  250 mg Oral BID Darleene Cleaver, Mojeed, MD   250 mg at 02/01/17 2114   . DULoxetine (CYMBALTA) DR capsule 60 mg  60 mg Oral Daily Elwin Mocha, MD   60 mg at 02/01/17 9983  . enoxaparin (LOVENOX) injection 40 mg  40 mg Subcutaneous Q24H Elwin Mocha, MD   40 mg at 02/01/17 0816  . fluticasone (FLONASE) 50 MCG/ACT nasal spray 2 spray  2 spray Each Nare PRN Elwin Mocha, MD      . insulin aspart (novoLOG) injection 0-15 Units  0-15 Units Subcutaneous TID WC Irene Pap N, DO   8 Units at 02/02/17 0758  . insulin aspart (novoLOG) injection 0-5 Units  0-5 Units Subcutaneous QHS Kayleen Memos, DO   3 Units at 02/01/17 2115  . ipratropium-albuterol (DUONEB) 0.5-2.5 (3) MG/3ML nebulizer solution 3 mL  3 mL Nebulization TID Elwin Mocha, MD   3 mL at 02/01/17 1921  . levothyroxine (SYNTHROID, LEVOTHROID) tablet 100 mcg  100 mcg Oral Once per day on  Mon Tue Wed Thu Elwin Mocha, MD   100 mcg at 02/02/17 0756  . levothyroxine (SYNTHROID, LEVOTHROID) tablet 112 mcg  112 mcg Oral Once per day on Sun Fri Sat Kayleen Memos, DO   112 mcg at 02/01/17 2956  . nitroGLYCERIN (NITROSTAT) SL tablet 0.4 mg  0.4 mg Sublingual Q5 min PRN Elwin Mocha, MD      . ondansetron [facility]) tablet 4 mg  4 mg Oral Q6H PRN Elwin Mocha, MD       Or  . ondansetron [facility]) injection 4 mg  4 mg Intravenous Q6H PRN Elwin Mocha, MD      . pantoprazole (PROTONIX) EC tablet 20 mg  20 mg Oral BID Elwin Mocha, MD   20 mg at 02/01/17 2114  . rOPINIRole (REQUIP) tablet 1 mg  1 mg Oral TID Elwin Mocha, MD   1 mg at 02/01/17 2114  . spironolactone (ALDACTONE) tablet 25 mg  25 mg Oral Daily Elwin Mocha, MD   25 mg at 02/01/17 0816  . traZODone (DESYREL) tablet 50 mg  50 mg Oral QHS PRN Neila Gear, NP   50 mg at 02/01/17 2114     Discharge Medications: Please see discharge summary for a list of discharge medications.  Relevant Imaging Results:  Relevant Lab Results:   Additional Information SSN: [place], [place]

## 2017-02-02 NOTE — Assessment & Plan Note (Signed)
Refer to hemeonc

## 2017-02-02 NOTE — Progress Notes (Signed)
Refer to heme-onc in [place]

## 2017-02-02 NOTE — Care Management Important Message (Signed)
Important Message  Patient Details  Name: Bonnie Henry MRN: [id number] Date of Birth: 01-03-1941   Medicare Important Message Given:  Yes    Mathews Stuhr [address] Circle 02/02/2017, 1:35 PM

## 2017-02-03 DIAGNOSIS — G9340 Encephalopathy, unspecified: Secondary | ICD-10-CM | POA: Diagnosis not present

## 2017-02-03 DIAGNOSIS — R1314 Dysphagia, pharyngoesophageal phase: Secondary | ICD-10-CM | POA: Diagnosis not present

## 2017-02-03 DIAGNOSIS — I10 Essential (primary) hypertension: Secondary | ICD-10-CM | POA: Diagnosis not present

## 2017-02-03 DIAGNOSIS — R41841 Cognitive communication deficit: Secondary | ICD-10-CM | POA: Diagnosis not present

## 2017-02-03 DIAGNOSIS — I4891 Unspecified atrial fibrillation: Secondary | ICD-10-CM | POA: Diagnosis not present

## 2017-02-03 DIAGNOSIS — R1312 Dysphagia, oropharyngeal phase: Secondary | ICD-10-CM | POA: Diagnosis not present

## 2017-02-03 DIAGNOSIS — F439 Reaction to severe stress, unspecified: Secondary | ICD-10-CM | POA: Diagnosis not present

## 2017-02-03 DIAGNOSIS — R5381 Other malaise: Secondary | ICD-10-CM | POA: Diagnosis not present

## 2017-02-03 DIAGNOSIS — G3184 Mild cognitive impairment, so stated: Secondary | ICD-10-CM | POA: Diagnosis not present

## 2017-02-03 DIAGNOSIS — R488 Other symbolic dysfunctions: Secondary | ICD-10-CM | POA: Diagnosis not present

## 2017-02-03 DIAGNOSIS — M6281 Muscle weakness (generalized): Secondary | ICD-10-CM | POA: Diagnosis not present

## 2017-02-03 DIAGNOSIS — Z9981 Dependence on supplemental oxygen: Secondary | ICD-10-CM | POA: Diagnosis not present

## 2017-02-03 DIAGNOSIS — F4310 Post-traumatic stress disorder, unspecified: Secondary | ICD-10-CM | POA: Diagnosis not present

## 2017-02-03 DIAGNOSIS — J9610 Chronic respiratory failure, unspecified whether with hypoxia or hypercapnia: Secondary | ICD-10-CM | POA: Diagnosis not present

## 2017-02-03 DIAGNOSIS — J189 Pneumonia, unspecified organism: Secondary | ICD-10-CM | POA: Diagnosis not present

## 2017-02-03 DIAGNOSIS — F329 Major depressive disorder, single episode, unspecified: Secondary | ICD-10-CM | POA: Diagnosis not present

## 2017-02-03 DIAGNOSIS — N390 Urinary tract infection, site not specified: Secondary | ICD-10-CM | POA: Diagnosis not present

## 2017-02-03 DIAGNOSIS — E119 Type 2 diabetes mellitus without complications: Secondary | ICD-10-CM | POA: Diagnosis not present

## 2017-02-03 DIAGNOSIS — M1711 Unilateral primary osteoarthritis, right knee: Secondary | ICD-10-CM | POA: Diagnosis not present

## 2017-02-03 DIAGNOSIS — F333 Major depressive disorder, recurrent, severe with psychotic symptoms: Secondary | ICD-10-CM | POA: Diagnosis not present

## 2017-02-03 DIAGNOSIS — G40401 Other generalized epilepsy and epileptic syndromes, not intractable, with status epilepticus: Secondary | ICD-10-CM | POA: Diagnosis not present

## 2017-02-03 DIAGNOSIS — F0280 Dementia in other diseases classified elsewhere without behavioral disturbance: Secondary | ICD-10-CM | POA: Diagnosis not present

## 2017-02-03 DIAGNOSIS — I509 Heart failure, unspecified: Secondary | ICD-10-CM | POA: Diagnosis not present

## 2017-02-03 DIAGNOSIS — J441 Chronic obstructive pulmonary disease with (acute) exacerbation: Secondary | ICD-10-CM | POA: Diagnosis not present

## 2017-02-03 DIAGNOSIS — R2689 Other abnormalities of gait and mobility: Secondary | ICD-10-CM | POA: Diagnosis not present

## 2017-02-03 LAB — GLUCOSE, CAPILLARY
Glucose-Capillary: 210 mg/dL — ABNORMAL HIGH (ref 65–99)
Glucose-Capillary: 285 mg/dL — ABNORMAL HIGH (ref 65–99)

## 2017-02-03 MED ORDER — METOCLOPRAMIDE HCL 5 MG PO TABS: 5.0000 mg | ORAL_TABLET | Freq: Three times a day (TID) | ORAL | 0 refills | Status: AC

## 2017-02-03 MED ORDER — DIVALPROEX SODIUM ER 250 MG PO TB24: 250.0000 mg | ORAL_TABLET | Freq: Two times a day (BID) | ORAL | 0 refills | Status: AC

## 2017-02-03 NOTE — Progress Notes (Signed)
Patient refused Laxative. Will continue to monitor.

## 2017-02-03 NOTE — Progress Notes (Signed)
Pas[place] received: [phone] [place] LCSW [phone]

## 2017-02-03 NOTE — Clinical Social Work Note (Signed)
Clinical Social Work Assessment  Patient Details  Name: Bonnie Henry MRN: [id number] Date of Birth: 09/04/1940  Date of referral:  02/03/17               Reason for consult:  Facility Placement                Permission sought to share information with:  Facility Sport and exercise psychologist, Family Supports Permission granted to share information::  No  Name::     Estate manager/land agent::  SNFs  Relationship::  Daughter  Contact Information:  [phone]  Housing/Transportation Living arrangements for the past 2 months:  [place] of Information:  Adult Children Patient Interpreter Needed:  None Criminal Activity/Legal Involvement Pertinent to Current Situation/Hospitalization:  No - Comment as needed Significant Relationships:  Adult Children Lives with:  Adult Children, Self Do you feel safe going back to the place where you live?  No Need for family participation in patient care:  Yes (Comment)  Care giving concerns:  CSW received consult for possible SNF placement at time of discharge. CSW spoke with patient's daughter at bedside while patient was asleep regarding PT recommendation of SNF placement at time of discharge. Patient's daughter reported that given patient's current physical needs and fall risk, she would like to go to rehab. Patient's daughter expressed understanding of PT recommendation and is agreeable to SNF placement at time of discharge. CSW to continue to follow and assist with discharge planning needs.   Social Worker assessment / plan:  CSW spoke with patient's daughter concerning possibility of rehab at [facility] before returning home.  Employment status:  Retired Forensic scientist:  Medicare PT Recommendations:  [place] / Referral to community resources:  [place]  Patient/Family's Response to care:  Patient recognizes need for rehab before returning home and is agreeable to a SNF in [place].  Patient reported preference for [facility] since patient's daughter has worked there before. Patient requested a roommate for company.  Patient/Family's Understanding of and Emotional Response to Diagnosis, Current Treatment, and Prognosis:  Patient/family is realistic regarding therapy needs and expressed being hopeful for SNF placement. Patient's daughter expressed understanding of CSW role and discharge process as well as medical condition. No questions/concerns about plan or treatment.    Emotional Assessment Appearance:  Appears stated age Attitude/Demeanor/Rapport:  Unable to Assess(Asleep) Affect (typically observed):  Unable to Assess(Asleep) Orientation:  Oriented to Self, Oriented to Situation, Oriented to Place, Oriented to  Time Alcohol / Substance use:  Not Applicable Psych involvement (Current and /or in the community):  No (Comment)  Discharge Needs  Concerns to be addressed:  Care Coordination Readmission within the last 30 days:  No Current discharge risk:  None Barriers to Discharge:  No Barriers Identified   Benard Halsted, [place] 02/03/2017, 11:53 AM

## 2017-02-03 NOTE — Discharge Instructions (Signed)
Toxic Metabolic Encephalopathy °Toxic metabolic encephalopathy (TME) is a type of brain disorder caused by a change in brain chemistry. This condition may result from illnesses or conditions that cause an imbalance of fluid, minerals (electrolytes), and other substances in the body that affect the way the brain functions. It is not caused by brain damage or brain disease. °TME can cause confusion and other mental disturbances, which are generally referred to as delirium. Untreated delirium may lead to permanent mental changes or worsening medical conditions. Untreated delirium is a life-threatening condition that may need to be treated in the hospital. °What are the causes? °Possible causes of TME that can lead to delirium include: °· Short-term (acute) or long-term (chronic) disease of the kidney or liver. °· Not having enough fluid in the body (dehydration). °· Changes in the acid level (pH) of the blood. °· High or low levels of any of the following substances in the blood: °? Calcium. °? Salt (sodium). °? Sugar (glucose). °? Magnesium. °? Phosphate. °· High body temperature. °· Not having enough oxygen in the blood. °· Low levels of B vitamins. This can result from alcohol abuse. °· Certain medicines, such as steroids and medicines that reduce the activity of the immune system (immunosuppressants). °· Certain infections. ° °What increases the risk? °You may have a higher risk for TME if you: °· Are elderly. °· Have dementia. °· Are in the hospital, especially in intensive care. °· Live in a nursing home. °· Had recent surgery. °· Have liver or kidney disease. °· Have poorly controlled diabetes. °· Have chronic medical problems, especially heart or lung disease. °· Are not getting enough fluids. °· Have poor nutrition. °· Abuse alcohol. ° °What are the signs or symptoms? °Symptoms of TME may include: °· Muscle stiffness or jerking (spasticity). °· Shaking (tremors). °· Flapping of the  hands. °· Weakness. °· Clumsiness. °· Slowed breathing. °· Jerky movements that you cannot control (seizures). °· Not being able to stay awake (drowsiness). °· Not being able to pay attention. °· Loss of consciousness (coma). ° °Symptoms of delirium caused by TME include: °· Confusion. °· Difficulty focusing or concentrating, or inability to focus or concentrate. °· Not knowing where you are (disorientation). °· Seeing or hearing things that are not real (hallucinations). °· Fearfulness. °· False beliefs (delusions). °· Changes in mood or personality. °· Changes in speech, such as saying things that do not make sense. °· Memory loss. °· Irritability. °· Avoiding other people (withdrawal). °· Depression. °· Poor judgment. °· Changes in eating and sleeping patterns. °· Hyperactivity. °· Decreased alertness. °· General mistrust of others (paranoia). ° °Delirium may come and go. Symptoms of delirium may start suddenly or gradually, and they often get worse at night. °How is this diagnosed? °This condition is diagnosed based on: °· Your symptoms and behavior. °· An exam to check how you are thinking, feeling, and behaving (mental status exam). To diagnose delirium, the mental status exam must rule out other possible causes of TME, and must show: °? Changes in attention and awareness. °? Changes that develop over a short period of time and tend to come and go (fluctuate). °? Changes in memory, language, and thinking that were not present before. °· A physical exam. °· Imaging tests, such as: °? MRI. °? CT scan. °· Blood tests to: °? Measure liver and kidney function. °? Check for a lack (deficiency) of vitamin B. °? Check for changes in acid levels (pH) and changes in calcium, sodium, or magnesium levels   in the blood. °? Measure your blood sugar (glucose). °? Measure your blood oxygen level. ° °How is this treated? °Treatment for TME depends on the cause, and it may include. °· Getting fluids through an IV  tube. °· Regulating calcium, sodium, glucose, or magnesium levels in the body. °· Getting oxygen. °· Improving nutrition. °· Treating liver or kidney disease. °· Adjusting certain medicines. °· Treating infections. ° °If the cause is found and treated, delirium usually improves. Managing delirium may include: °· Keeping the room well-lit and quiet. °· Using calendars, pictures, and clocks to prevent disorientation. °· Having frequent checks from nursing staff and visits from caregivers. °· Wearing eyeglasses or a hearing aid, if needed. °· Physical therapy. °· Medicine to treat agitation, anxiety, hallucinations, or delusions. ° °Follow these instructions at home: °· Drink enough fluid to keep your urine clear or pale yellow. °· Take over-the-counter and prescription medicines only as told by your health care provider. °· Return to your normal activities as told by your health care provider. Ask your health care provider what activities are safe for you. °· Follow a healthy diet. Do not skip meals. °· Do not drink alcohol. °· Go to bed at the same time every night. °· Keep all follow-up visits as told by your health care provider. This is important. °Contact a health care provider if: °· You are unable to feed yourself or hydrate yourself. °· You need help at home. °· You start to feel clumsy. °· You start to have tremors or weakness. °Get help right away if: °· You have a seizure. °· You lose consciousness. °· You have trouble breathing. °· You do not feel able to care for yourself at home. °· You have a fever. °· You become disoriented at home. °This information is not intended to replace advice given to you by your health care provider. Make sure you discuss any questions you have with your health care provider. °Document Released: 06/01/2015 Document Revised: 08/27/2015 Document Reviewed: 06/01/2015 °Elsevier Interactive Patient Education © 2018 [employer] ° °

## 2017-02-03 NOTE — Evaluation (Signed)
Clinical/Bedside Swallow Evaluation Patient Details  Name: Bonnie Henry MRN: [id number] Date of Birth: 02/02/1940  Today's Date: 02/03/2017 Time: SLP Start Time (ACUTE ONLY): 1048 SLP Stop Time (ACUTE ONLY): 1104 SLP Time Calculation (min) (ACUTE ONLY): 16 min  Past Medical History:  Past Medical History:  Diagnosis Date  . Anemia   . Anxiety   . Aortic atherosclerosis ([place]) 12/09/2016   Chest CT Dec 2018  . Arthritis    "joints ache all over" . osteoarthritis  . Asthma   . Atrial fibrillation ([place])   . Breast cancer, left breast ([facility])    S/P mastectomy  . CAD (coronary artery disease)   . CHF (congestive heart failure) ([place])   . Chronic back pain   . Constipation 03/04/2015  . COPD (chronic obstructive pulmonary disease) ([place])   . Depression, major   . Diabetes mellitus without complication ([place])   . DVT (deep venous thrombosis) ([place])   . GERD (gastroesophageal reflux disease)   . Headache   . Heart murmur   . Hepatitis    HEP "C"  . History of blood transfusion    "when I had my mastectomy"  . History of hiatal hernia   . History of right-sided carotid endarterectomy 06/10/2015  . Hypercholesteremia   . Hypertension   . Morbid obesity ([place])   . Myocardial infarction ([place])   . Neuropathy   . Peripheral vascular disease ([place])   . Post-surgical hypothyroidism   . Restless leg syndrome   . Shortness of breath dyspnea   . Sleep apnea    "2015 wore mask; lost weight; told didn't need mask anymore" (02/23/2014)  . Type II diabetes mellitus ([place])    Past Surgical History:  Past Surgical History:  Procedure Laterality Date  . ARTERY BIOPSY Right 09/29/2014   Procedure: BIOPSY TEMPORAL ARTERY;  Surgeon: Angelia Mould, MD;  Location: [facility];  Service: Vascular;  Laterality: Right;  . BARTHOLIN CYST MARSUPIALIZATION    . BREAST BIOPSY Left    Mastectomy  . CARDIAC CATHETERIZATION  2000's  . CAROTID ENDARTERECTOMY Left 2009  . CATARACT EXTRACTION W/  INTRAOCULAR LENS  IMPLANT, BILATERAL Bilateral ~ 2011  . CORONARY ANGIOPLASTY WITH STENT PLACEMENT  1990's X 2   "2; 1"  . ENDARTERECTOMY Right 05/16/2015   Procedure: ENDARTERECTOMY CAROTID;  Surgeon: Katha Cabal, MD;  Location: [facility];  Service: Vascular;  Laterality: Right;  . ESOPHAGOGASTRODUODENOSCOPY (EGD) WITH PROPOFOL N/A 12/12/2014   Procedure: ESOPHAGOGASTRODUODENOSCOPY (EGD) WITH PROPOFOL;  Surgeon: Lucilla Lame, MD;  Location: [facility];  Service: Endoscopy;  Laterality: N/A;  . EYE SURGERY Bilateral    Catract Extraction with IOL  . HAMMER TOE SURGERY Left    2nd and 3rd digits  . JOINT REPLACEMENT Bilateral    Total Hip Replacement  . JOINT REPLACEMENT Left    Total Knee Replacement  . LEFT HEART CATHETERIZATION WITH CORONARY ANGIOGRAM N/A 02/24/2014   Procedure: LEFT HEART CATHETERIZATION WITH CORONARY ANGIOGRAM;  Surgeon: Leonie Man, MD;  Location: [facility] CATH LAB;  Service: Cardiovascular;  Laterality: N/A;  . MASTECTOMY Left 1990's  . SHOULDER SURGERY Right    "cleaned it out; no scope"  . THYROIDECTOMY, PARTIAL  1950's  . TOE SURGERY Right    "cut piece of bone out so toe didn't dig into my foot"  . TONSILLECTOMY  1950's  . TOTAL HIP ARTHROPLASTY Bilateral 1990's  . TOTAL KNEE ARTHROPLASTY Left 1990's  . TRIGGER FINGER RELEASE Left   . TUBAL LIGATION    .  VAGINAL HYSTERECTOMY     "partial"   HPI:  Bonnie Madej Snowbergeris a 77 y.o.femalewith past medical history significant for GERD, chronic resp failure on 2L O2 continuously,anxiety, anemia, asthma, chronic heart failure, breast cancer, chronic atrial fibrillation, pulmonary nodules, depression with psychosis and type 2 diabetes presents the emergency room with altered mental status. CXR were clear of consolidations.   Assessment / Plan / Recommendation Clinical Impression   Pt presents with an overtly functional oropharyngeal swallow. Pt had no signs of aspiration throughout trials, which was  consistent with previous swallowing evaluations. Pt required min cues to take small bites/sips during trials. Pt reports chronic globus sensation and need to regurgitate, triggered by particular foods, and reports being told previously that her esophageal muscles are weak. Esophageal precautions were reviewed with pt, though pt reports softer food was not always helpful. Recommend continued regular solids and thin liquid diet as tolerated. Defer further recommendations to medical team to determine pt's need for possible GI/esophageal work up. SLP Visit Diagnosis: Dysphagia, unspecified (R13.10)    Aspiration Risk  Mild aspiration risk(post-prandial aspiration from pt reported regurgitation)    Diet Recommendation Regular;Thin liquid   Liquid Administration via: Cup Medication Administration: Whole meds with liquid(potentially benefiting from puree) Supervision: Patient able to self feed Compensations: Minimize environmental distractions;Slow rate;Small sips/bites Postural Changes: Seated upright at 90 degrees;Remain upright for at least 30 minutes after po intake    Other  Recommendations Recommended Consults: Consider GI evaluation;Consider esophageal assessment Oral Care Recommendations: Oral care BID   Follow up Recommendations None      Frequency and Duration            Prognosis Prognosis for Safe Diet Advancement: Fair Barriers to Reach Goals: Time post onset;Other (Comment)(primary esophageal deficits)      Swallow Study   General HPI: Bonnie Leavey Snowbergeris a 77 y.o.femalewith past medical history significant for GERD, chronic resp failure on 2L O2 continuously,anxiety, anemia, asthma, chronic heart failure, breast cancer, chronic atrial fibrillation, pulmonary nodules, depression with psychosis and type 2 diabetes presents the emergency room with altered mental status. CXR were clear of consolidations. Type of Study: Bedside Swallow Evaluation Previous Swallow  Assessment: Pt seen by SLP for MBS in 2016 and BSE in 2017, with both evaluations revealing functional swallow across consistencies Diet Prior to this Study: Regular;Thin liquids Temperature Spikes Noted: No Respiratory Status: Room air History of Recent Intubation: No Behavior/Cognition: Alert;Cooperative Oral Cavity Assessment: Within Functional Limits Oral Care Completed by SLP: No Oral Cavity - Dentition: Adequate natural dentition Vision: Functional for self-feeding Self-Feeding Abilities: Able to feed self Patient Positioning: Upright in bed Baseline Vocal Quality: Normal Volitional Cough: Strong    Oral/Motor/Sensory Function Overall Oral Motor/Sensory Function: Within functional limits   Ice Chips Ice chips: Not tested   Thin Liquid Thin Liquid: Within functional limits Presentation: Cup    Nectar Thick Nectar Thick Liquid: Not tested   Honey Thick Honey Thick Liquid: Not tested   Puree Puree: Within functional limits Presentation: Self Fed;Spoon   Solid   GO   Solid: Within functional limits Presentation: Self Fed        [place] Hayly Litsey 02/03/2017,1:42 PM   [place] Reneka Nebergall SLP Student Clinician

## 2017-02-03 NOTE — Consult Note (Signed)
   [facility] Inpatient Consult   02/03/2017  Bonnie Henry [birth year] [id number]  Patient is currently active with [facility] for chronic disease management services.  Patient has been engaged by a [facility] CSW.  Our community based plan of care has focused on disease management and community resource support and assisting the patient with possible long term residence at [facility]. Our social worker will follow up.  Patient's daughter was not available but patient agreed to follow up.   Patient will receive a post discharge transition at the facility.  Of note, [facility] Care Management services does not replace or interfere with any services that are needed or arranged by inpatient case management or social work.  For additional questions or referrals please contact:  Natividad Brood, RN BSN [facility] Liaison  [phone] business mobile phone Toll free office [phone]

## 2017-02-03 NOTE — Progress Notes (Signed)
Patient will discharge to [facility] Anticipated discharge date: 1/29 Family notified: dtr [place] by Corey Harold- called 2:50pm  [place] signing off.  Jorge Ny, LCSW Clinical Social Worker [phone]

## 2017-02-03 NOTE — Progress Notes (Signed)
Patient is waiting for [facility] to transport her to the facility.

## 2017-02-03 NOTE — Discharge Summary (Signed)
Discharge Summary  Bonnie Henry:[id number] DOB: May 31, 1940  PCP: Arnetha Courser, MD  Admit date: 01/29/2017 Discharge date: 02/03/2017  Time spent: 25 minutes  Recommendations for Outpatient Follow-up:  1. Follow up with PCP post hospitalization 2. Follow up with psychiatry post hospitalization 3. Please take your medications as prescribed 4. Fall precaution  Discharge Diagnoses:  Active Hospital Problems   Diagnosis Date Noted  . Depression, major, recurrent, severe with psychosis ([place]) 12/09/2016  . Acute encephalopathy 01/29/2017    Resolved Hospital Problems  No resolved problems to display.    Discharge Condition: stable   Diet recommendation: Diabetic cardiac diet  Vitals:   02/03/17 0838 02/03/17 0840  BP:    Pulse:    Resp:    Temp:    SpO2: (!) 88% (!) 88%    History of present illness:  Lailee Hoelzel Snowbergeris a 77 y.o.femalewith past medical history significant for chronic resp failure on 2L O2 continuously,anxiety, anemia, asthma, chronic heart failure, breast cancer, chronic atrial fibrillation, pulmonary nodules, depression with psychosis and type 2 diabetes presents the emergency room with altered mental status. On presentation productive cough and cxr with RLL infiltrates vs atelectasis. Also pyuria, started in the ED on rocephin. Admitted due to acute metabolic encephalopathy 2/2 to UTI, poa vs others. 02/01/17: Reports she feels depressed. Psychiatry consulted to evaluate.  02/02/17: seen and examined with her daughter at her beside. No new complaints. New psych medications appear to be very helpful.  02/03/17: No complaints.  On the day of discharge the patient was hemodynamically stable. She will need to continue PT at SNF and follow up with her PCP/Psych post hospitalization.   Hospital Course:  Principal Problem:   Depression, major, recurrent, severe with psychosis ([place]) Active Problems:   Acute encephalopathy   Acute  metabolic encephalopathy 2/2 to UTI, poa vs others, resolved CT head negative Urine cx no growth to date Continue to hold gabapentin, buspar TSH 11.22 continue levothyroxine 112 mcg  UTI, poa -continue IV antibiotics -continue IV fluid -monitor urine output  Intractable nausea suspect diabetes gastroparesis -reglan  Self reported dysphagia with solids -speech therapist for swallow eval  Uncontrolled depression -denies SI or HI -psych consulted -appreciate recommendations -at home on: abilify, depakote, cymbalta Psych recommendations: -Continue Cymbalta 60 mg daily for depression -Change Abilify 2mg  daily to bedtime due to excessive daytime sedation -Increased Depakote 250mg  to twice daily to address agitation/aggression  Insulin depdt type 2 diabetes -A1C 8.7 (01/23/17) -ISS -avoid hypoglycemia -achs CBG  pseudoHyponatremia -Na+ 133, chemistry blood gluciose 238 -corrected Na+ 135 -treat hyperglycemia  Hyperglycemia -ISS -A1C 8.7 (01/23/17) -BMP am  Hypothyroidism -TSH 11.22 -levothyroxyne 112 mcg  Moderate to severe physical deconditioning/generalized weakness -PT evaluated and recommended short term rehab for physical therapy -continue PT  COPD/centrilobular emphysema  -Poss mild case of AE -Continue Prn albuterol -continue Sch duonebs -continue pulmicort  HTN -BP is stable -Cont Norvasc, spironolactone   Hx of pulmoanry nodules (12/08/16) -noted  Chronic dCHF EF 60% (2016) -Cont coreg -continue lasix, spironolactone -strict I&Os, daily weights  Hyperlipidemia -Continue statin  Allergies -Cont flonase  CAD -Con prn ntg sl -continue asa, lipitor, carvedilol  GERD -stable -Cont PPI  RLS -stable -Cont requip   Procedures:  none  Consultations:  Psychiatry   Discharge Exam: BP (!) 149/70 (BP Location: Right Arm)   Pulse 72   Temp 98.6 F (37 C)   Resp 18   Ht 5\' 3"  (1.6 m)   Wt 90.7 kg (  200 lb)    SpO2 (!) 88% Comment: pt had cannula on top of head  BMI 35.43 kg/m   General: 77 yo CF WD WN NAD A&O x3  Cardiovascular: RRR no rubs or gallops Respiratory: CTA no rales or wheezes  Discharge Instructions You were cared for by a hospitalist during your hospital stay. If you have any questions about your discharge medications or the care you received while you were in the hospital after you are discharged, you can call the unit and asked to speak with the hospitalist on call if the hospitalist that took care of you is not available. Once you are discharged, your primary care physician will handle any further medical issues. Please note that NO REFILLS for any discharge medications will be authorized once you are discharged, as it is imperative that you return to your primary care physician (or establish a relationship with a primary care physician if you do not have one) for your aftercare needs so that they can reassess your need for medications and monitor your lab values.   Allergies as of 02/03/2017      Reactions   Vasotec [enalapril] Swelling   Throat swells   Risperidone Rash   Codeine Nausea And Vomiting   Morphine And Related Nausea And Vomiting      Medication List    STOP taking these medications   acetaminophen 500 MG tablet Commonly known as:  TYLENOL   busPIRone 15 MG tablet Commonly known as:  BUSPAR   doxycycline 100 MG tablet Commonly known as:  VIBRA-TABS   oxyCODONE-acetaminophen 5-325 MG tablet Commonly known as:  ROXICET     TAKE these medications   amLODipine 5 MG tablet Commonly known as:  NORVASC Take 5 mg by mouth daily.   ARIPiprazole 2 MG tablet Commonly known as:  ABILIFY Take 2 mg by mouth daily.   ARTIFICIAL TEARS OP Place 1 drop into both eyes as needed (dry eyes).   aspirin EC 81 MG tablet Take 81 mg by mouth daily.   atorvastatin 80 MG tablet Commonly known as:  LIPITOR Take 1 tablet (80 mg total) by mouth at bedtime.     budesonide 0.5 MG/2ML nebulizer solution Commonly known as:  PULMICORT Take 0.5 mg by nebulization 2 (two) times daily.   bumetanide 0.5 MG tablet Commonly known as:  BUMEX Take 0.5 mg by mouth daily as needed (fluid).   carvedilol 12.5 MG tablet Commonly known as:  COREG Take 12.5 mg by mouth 2 (two) times daily with a meal.   divalproex 250 MG 24 hr tablet Commonly known as:  DEPAKOTE ER Take 1 tablet (250 mg total) by mouth 2 (two) times daily. What changed:  when to take this   DULoxetine 60 MG capsule Commonly known as:  CYMBALTA Take 1 capsule (60 mg total) by mouth daily.   Fish Oil 1200 MG Caps Take 1 capsule by mouth 2 (two) times a week.   fluticasone 50 MCG/ACT nasal spray Commonly known as:  FLONASE Place 2 sprays into both nostrils as needed for allergies.   formoterol 20 MCG/2ML nebulizer solution Commonly known as:  PERFOROMIST Inhale 20 mcg into the lungs 2 (two) times daily.   insulin NPH Human 100 UNIT/ML injection Commonly known as:  HUMULIN N,NOVOLIN N Inject 24 Units into the skin at bedtime.   insulin regular 100 units/mL injection Commonly known as:  NOVOLIN R,HUMULIN R Inject 28 Units 3 (three) times daily before meals into the skin. Based on  sliding scale   levothyroxine 112 MCG tablet Commonly known as:  SYNTHROID, LEVOTHROID Take 1 tablet (112 mcg total) by mouth every other day. (alternate with the 100 mcg strength) What changed:    when to take this  additional instructions  Another medication with the same name was removed. Continue taking this medication, and follow the directions you see here.   meclizine 25 MG tablet Commonly known as:  ANTIVERT Take 0.5-1 tablets (12.5-25 mg total) by mouth 3 (three) times daily as needed for dizziness.   metoCLOPramide 5 MG tablet Commonly known as:  REGLAN Take 1 tablet (5 mg total) by mouth 3 (three) times daily before meals for 3 days.   montelukast 10 MG tablet Commonly known as:   SINGULAIR TAKE ONE TABLET BY MOUTH ONCE DAILY What changed:    how much to take  how to take this  when to take this   NEURONTIN 600 MG tablet Generic drug:  gabapentin Take 1 tablet (600 mg total) 2 (two) times daily by mouth.   nitroGLYCERIN 0.4 MG SL tablet Commonly known as:  NITROSTAT Place 1 tablet (0.4 mg total) every 5 (five) minutes as needed under the tongue for chest pain. Max of 3 pills total, call 911   OXYGEN Inhale 2 L into the lungs continuous.   pantoprazole 20 MG tablet Commonly known as:  PROTONIX TAKE ONE TABLET BY MOUTH TWICE DAILY What changed:    how much to take  how to take this  when to take this   PROAIR HFA 108 (90 Base) MCG/ACT inhaler Generic drug:  albuterol Inhale 2 puffs into the lungs every 4 (four) hours as needed for wheezing or shortness of breath.   albuterol 0.63 MG/3ML nebulizer solution Commonly known as:  ACCUNEB Take 1 ampule by nebulization every 6 (six) hours as needed for wheezing or shortness of breath.   ranitidine 300 MG tablet Commonly known as:  ZANTAC Take 1 tablet (300 mg total) by mouth at bedtime.   rOPINIRole 1 MG tablet Commonly known as:  REQUIP TAKE 1 TABLET BY MOUTH THREE TIMES DAILY What changed:    how much to take  how to take this  when to take this   spironolactone 25 MG tablet Commonly known as:  ALDACTONE Take 25 mg by mouth daily.   VITAMIN C PO Take 1 tablet by mouth 3 (three) times a week. MWF      Allergies  Allergen Reactions  . Vasotec [Enalapril] Swelling    Throat swells  . Risperidone Rash  . Codeine Nausea And Vomiting  . Morphine And Related Nausea And Vomiting    Contact information for follow-up providers    [place] [facility].   Specialty:  Professional Counselor Why:  Walk in for first appointment Contact information: [employer] [facility] [place] [address] [phone]        Center,  [place]. Schedule an appointment as soon as possible for a visit.   Contact information: [place] [address] [phone]        Arnetha Courser, MD Follow up.   Specialty:  Family Medicine Contact information: [place] [address] [phone]            Contact information for after-discharge care    Destination    [facility] Follow up.   Service:  Skilled Nursing Contact information:  [place] [phone]                   The results of significant diagnostics from this hospitalization (including imaging, microbiology, ancillary and laboratory) are listed below for reference.    Significant Diagnostic Studies: Dg Chest 2 View  Result Date: 01/29/2017 CLINICAL DATA:  Altered mental status EXAM: CHEST  2 VIEW COMPARISON:  01/26/2017, 12/09/2016, CT chest 12/08/2016 FINDINGS: No significant pleural effusion. Mild cardiomegaly with aortic atherosclerosis. No acute consolidation. Patchy opacity at the right middle lobe is similar compared to prior. No pneumothorax. Degenerative changes of the spine. Surgical changes at the right humeral head. IMPRESSION: No active cardiopulmonary disease.  Stable cardiomegaly Electronically Signed   By: Donavan Foil M.D.   On: 01/29/2017 19:52   Dg Chest 2 View  Result Date: 01/26/2017 CLINICAL DATA:  Initial evaluation for acute trauma, fall. EXAM: CHEST  2 VIEW COMPARISON:  Prior radiograph from 12/09/2016. FINDINGS: Mild cardiomegaly, stable. Mediastinal silhouette normal. Aortic atherosclerosis. Lungs are hypoinflated. No focal infiltrates. No pulmonary edema or pleural effusion. No pneumothorax. No acute osseous abnormality. IMPRESSION: 1. No active cardiopulmonary disease. 2. Aortic atherosclerosis. Electronically Signed   By: Jeannine Boga M.D.   On: 01/26/2017 20:33   Dg Thoracic Spine 2  View  Result Date: 01/26/2017 CLINICAL DATA:  Initial evaluation for acute trauma, fall. EXAM: THORACIC SPINE 2 VIEWS COMPARISON:  None. FINDINGS: Vertebral bodies normally aligned with preservation of the normal thoracic kyphosis. Vertebral body heights maintained. No acute fracture or malalignment. Moderate multilevel degenerative spondylolysis with osteophytic spurring seen throughout the thoracic spine. Visualized lungs are clear.  Coronary stents noted. IMPRESSION: No radiographic evidence for acute traumatic injury within the thoracic spine. Electronically Signed   By: Jeannine Boga M.D.   On: 01/26/2017 20:26   Dg Lumbar Spine 2-3 Views  Result Date: 01/26/2017 CLINICAL DATA:  Initial evaluation for acute trauma, fall. EXAM: LUMBAR SPINE - 2-3 VIEW COMPARISON:  Prior radiograph from 11/21/2016. FINDINGS: Five non rib-bearing lumbar type vertebral bodies. Trace anterolisthesis of L4 on L5, stable. Vertebral bodies otherwise normally aligned with preservation of the normal lumbar lordosis. Vertebral body heights maintained. No acute fracture or malalignment. Visualized sacrum intact. Moderate multilevel degenerate spondylolysis and facet arthrosis, greatest at L4-5, stable. Right hip arthroplasty partially visualized. Visualized pelvis intact. Extensive aorto bi-iliac atherosclerosis noted. IMPRESSION: 1. No radiographic evidence for acute traumatic injury within the lumbar spine. 2. Moderate multilevel degenerate spondylolysis and facet arthrosis, stable. Electronically Signed   By: Jeannine Boga M.D.   On: 01/26/2017 20:29   Dg Shoulder Right  Result Date: 01/26/2017 CLINICAL DATA:  Initial evaluation for acute trauma, fall. EXAM: RIGHT SHOULDER - 2+ VIEW COMPARISON:  None. FINDINGS: No acute fracture dislocation. Glenohumeral and acromioclavicular joints approximated. Sequelae of prior rotator cuff repair. Moderate degenerative osteoarthrosis about the shoulder. No acute soft tissue  abnormality. IMPRESSION: 1. No acute osseous abnormality about the right shoulder. 2. Moderate degenerative osteoarthrosis with sequelae of prior rotator cuff repair. Electronically Signed   By: Jeannine Boga M.D.   On: 01/26/2017 20:30   Ct Head Wo Contrast  Result Date: 01/29/2017 CLINICAL DATA:  Recent urinary tract infection. Altered mental status with confusion. No reported injury. EXAM: CT HEAD WITHOUT CONTRAST TECHNIQUE: Contiguous axial images were obtained from the base of the skull through the vertex without intravenous contrast. COMPARISON:  09/01/2013 head CT. FINDINGS: Brain: No evidence of parenchymal hemorrhage or extra-axial fluid collection.  No mass lesion, mass effect, or midline shift. No CT evidence of acute infarction. Generalized cerebral volume loss. Nonspecific prominent subcortical and periventricular white matter hypodensity, most in keeping with chronic small vessel ischemic change. Cerebral ventricle sizes are stable and concordant with the degree of cerebral volume loss. Vascular: No acute abnormality. Skull: No evidence of calvarial fracture. Sinuses/Orbits: The visualized paranasal sinuses are essentially clear. Stable bilateral proptosis. Other: Mild partial left mastoid effusion. Clear right mastoid air cells. IMPRESSION: 1.  No evidence of acute intracranial abnormality. 2. Generalized cerebral volume loss and prominent chronic small vessel ischemic changes in the cerebral white matter. 3. Nonspecific mild partial left mastoid effusion. Electronically Signed   By: Ilona Sorrel M.D.   On: 01/29/2017 19:26   Ct Cervical Spine Wo Contrast  Result Date: 01/26/2017 CLINICAL DATA:  Fall today with multiple injuries. EXAM: CT CERVICAL SPINE WITHOUT CONTRAST TECHNIQUE: Multidetector CT imaging of the cervical spine was performed without intravenous contrast. Multiplanar CT image reconstructions were also generated. COMPARISON:  None. FINDINGS: Alignment: Normal. Skull base  and vertebrae: No acute fracture identified. No bony lesions seen. Soft tissues and spinal canal: No prevertebral soft tissue swelling identified. No evidence of hemorrhage or soft tissue masses. No incidental lymphadenopathy identified. Disc levels: Moderate degenerative disc disease present at C4-5, C5-6, C6-7 and C7-T1. No listhesis. Upper chest: Negative. Other: None. IMPRESSION: No acute cervical spine fracture identified. Moderate degenerative disc disease present. Electronically Signed   By: Aletta Edouard M.D.   On: 01/26/2017 19:36   Dg Hip Unilat With Pelvis 2-3 Views Left  Result Date: 01/26/2017 CLINICAL DATA:  Initial evaluation for acute trauma, fall. EXAM: DG HIP (WITH OR WITHOUT PELVIS) 2-3V LEFT COMPARISON:  Prior radiograph from 11/23/2014. 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 No acute fracture or dislocation. No evidence for hardware complication. Limited views of the right hip demonstrate no acute abnormality. SI joints approximated and symmetric. No acute soft tissue abnormality. Degenerative changes noted within the lower lumbar spine. IMPRESSION: 1. No acute osseous abnormality about the left hip. 2. Left total hip arthroplasty in place without hardware complication. Electronically Signed   By: Jeannine Boga M.D.   On: 01/26/2017 20:32    Microbiology: Recent Results (from the past 240 hour(s))  Urine culture     Status: None   Collection Time: 01/29/17  6:14 PM  Result Value Ref Range Status   Specimen Description URINE, CLEAN CATCH  Final   Special Requests NONE  Final   Culture NO GROWTH  Final   Report Status 01/31/2017 FINAL  Final     Labs: Basic Metabolic Panel: Recent Labs  Lab 01/29/17 1823 01/30/17 0246 01/31/17 0830 02/02/17 0901  NA 136 141 133* 134*  K 3.6 3.4* 4.6 3.9  CL 99* 101 96* 95*  CO2 26 27 23 25   GLUCOSE 178* 93 238* 269*  BUN 15 12 8 14   CREATININE 1.16* 1.09* 1.05* 1.14*  CALCIUM 8.4* 8.4* 9.0 9.2   Liver  Function Tests: Recent Labs  Lab 01/29/17 1823  AST 25  ALT 14  ALKPHOS 151*  BILITOT 1.6*  PROT 7.4  ALBUMIN 3.6   No results for input(s): LIPASE, AMYLASE in the last 168 hours. No results for input(s): AMMONIA in the last 168 hours. CBC: Recent Labs  Lab 01/29/17 1823 01/30/17 0246 01/31/17 0830  WBC 13.4* 11.4* 11.2*  NEUTROABS 10.1*  --   --   HGB 10.6* 10.4* 10.9*  HCT 33.5* 31.3* 34.7*  MCV 91.0 90.5 91.1  PLT 167 151 182   Cardiac Enzymes: No results for input(s): CKTOTAL, CKMB, CKMBINDEX, TROPONINI in the last 168 hours. BNP: BNP (last 3 results) Recent Labs    01/29/17 1823  BNP 128.5*    ProBNP (last 3 results) No results for input(s): PROBNP in the last 8760 hours.  CBG: Recent Labs  Lab 02/02/17 1204 02/02/17 1709 02/02/17 2118 02/03/17 0748 02/03/17 1208  GLUCAP 276* 185* 174* 210* 285*       Signed:  Kayleen Memos, MD [facility] 02/03/2017, 2:44 PM

## 2017-02-03 NOTE — Progress Notes (Signed)
Patient was discharged to nursing home [facility]) by MD order; discharged instructions review and sent to facility via [facility] with care notes; IV DIC; facility was called and report was given to the nurse who is going to receive the patient; patient will be transported to facility via [facility]. Daughter aware about discharge.

## 2017-02-03 NOTE — Clinical Social Work Placement (Signed)
   CLINICAL SOCIAL WORK PLACEMENT  NOTE  Date:  02/03/2017  Patient Details  Name: Bonnie Henry MRN: [id number] Date of Birth: 01/24/1940  Clinical Social Work is seeking post-discharge placement for this patient at the [place] level of care (*CSW will initial, date and re-position this form in  chart as items are completed):  Yes   Patient/family provided with [facility] Work Department's list of facilities offering this level of care within the geographic area requested by the patient (or if unable, by the patient's family).  Yes   Patient/family informed of their freedom to choose among providers that offer the needed level of care, that participate in Medicare, Medicaid or managed care program needed by the patient, have an available bed and are willing to accept the patient.  Yes   Patient/family informed of [facility]'s ownership interest in [facility] and [facility], as well as of the fact that they are under no obligation to receive care at these facilities.  PASRR submitted to EDS on 02/02/17     PASRR number received on       Existing PASRR number confirmed on 02/03/17     FL2 transmitted to all facilities in geographic area requested by pt/family on 02/02/17     FL2 transmitted to all facilities within larger geographic area on       Patient informed that his/her managed care company has contracts with or will negotiate with certain facilities, including the following:        Yes   Patient/family informed of bed offers received.  Patient chooses bed at [facility]     Physician recommends and patient chooses bed at      Patient to be transferred to [facility] on 02/03/17.  Patient to be transferred to facility by [facility]     Patient family notified on 02/03/17 of transfer.  Name of family member notified:  Daughter     PHYSICIAN Please prepare priority discharge summary, including medications     Additional  Comment:    _______________________________________________ Benard Halsted, [place] 02/03/2017, 11:56 AM

## 2017-02-04 ENCOUNTER — Other Ambulatory Visit: Payer: Self-pay | Admitting: Licensed Clinical Social Worker

## 2017-02-04 ENCOUNTER — Ambulatory Visit (HOSPITAL_COMMUNITY): Payer: Self-pay | Admitting: Psychiatry

## 2017-02-04 NOTE — Patient Outreach (Signed)
[facility]) Care Management  [facility] Social Work  02/04/2017  Bonnie Henry 1940/09/02 [id number]  Encounter Medications:  Outpatient Encounter Medications as of 02/04/2017  Medication Sig Note  . albuterol (ACCUNEB) 0.63 MG/3ML nebulizer solution Take 1 ampule by nebulization every 6 (six) hours as needed for wheezing or shortness of breath.    Marland Kitchen albuterol (PROAIR HFA) 108 (90 BASE) MCG/ACT inhaler Inhale 2 puffs into the lungs every 4 (four) hours as needed for wheezing or shortness of breath.    Marland Kitchen amLODipine (NORVASC) 5 MG tablet Take 5 mg by mouth daily.    . ARIPiprazole (ABILIFY) 2 MG tablet Take 2 mg by mouth daily.  01/31/2017: #30 filled 01/16/17 [facility]  . Ascorbic Acid (VITAMIN C PO) Take 1 tablet by mouth 3 (three) times a week. MWF   . aspirin EC 81 MG tablet Take 81 mg by mouth daily.   Marland Kitchen atorvastatin (LIPITOR) 80 MG tablet Take 1 tablet (80 mg total) by mouth at bedtime. 01/31/2017: #90 filled 12/21/16 [facility]  . budesonide (PULMICORT) 0.5 MG/2ML nebulizer solution Take 0.5 mg by nebulization 2 (two) times daily.    . bumetanide (BUMEX) 0.5 MG tablet Take 0.5 mg by mouth daily as needed (fluid).    . carvedilol (COREG) 12.5 MG tablet Take 12.5 mg by mouth 2 (two) times daily with a meal.  01/31/2017: #60/30 days filled 12/04/16 [facility]  . divalproex (DEPAKOTE ER) 250 MG 24 hr tablet Take 1 tablet (250 mg total) by mouth 2 (two) times daily.   . DULoxetine (CYMBALTA) 60 MG capsule Take 1 capsule (60 mg total) by mouth daily. 01/31/2017: #30 filled 01/16/17 [facility]  . fluticasone (FLONASE) 50 MCG/ACT nasal spray Place 2 sprays into both nostrils as needed for allergies.    . formoterol (PERFOROMIST) 20 MCG/2ML nebulizer solution Inhale 20 mcg into the lungs 2 (two) times daily.    Marland Kitchen gabapentin (NEURONTIN) 600 MG tablet Take 1 tablet (600 mg total) 2 (two) times daily by mouth. 01/31/2017: #120/30 days filled 01/20/17 [facility]  . Hypromellose (ARTIFICIAL TEARS  OP) Place 1 drop into both eyes as needed (dry eyes).    . insulin NPH Human (HUMULIN N,NOVOLIN N) 100 UNIT/ML injection Inject 24 Units into the skin at bedtime.  12/11/2016: Not filled since 06/12/16. Possible fill OTC  . insulin regular (NOVOLIN R,HUMULIN R) 100 units/mL injection Inject 28 Units 3 (three) times daily before meals into the skin. Based on sliding scale    . levothyroxine (SYNTHROID, LEVOTHROID) 112 MCG tablet Take 1 tablet (112 mcg total) by mouth every other day. (alternate with the 100 mcg strength) (Patient taking differently: Take 112 mcg by mouth See admin instructions. Take 1 tablet (112 mcg) by mouth before breakfast on Friday, Saturday, Sunday (take 100 mcg other days)) 01/31/2017: #15/15 days filled 01/16/17 [facility]  . meclizine (ANTIVERT) 25 MG tablet Take 0.5-1 tablets (12.5-25 mg total) by mouth 3 (three) times daily as needed for dizziness.   . metoCLOPramide (REGLAN) 5 MG tablet Take 1 tablet (5 mg total) by mouth 3 (three) times daily before meals for 3 days.   . montelukast (SINGULAIR) 10 MG tablet TAKE ONE TABLET BY MOUTH ONCE DAILY (Patient taking differently: TAKE ONE TABLET (10 MG) BY MOUTH ONCE DAILY) 01/31/2017: #30 filled 12/04/16 [facility]  . nitroGLYCERIN (NITROSTAT) 0.4 MG SL tablet Place 1 tablet (0.4 mg total) every 5 (five) minutes as needed under the tongue for chest pain. Max of 3 pills total, call 911 01/23/2017: Can't  afford this medication   . Omega-3 Fatty Acids (FISH OIL) 1200 MG CAPS Take 1 capsule by mouth 2 (two) times a week.    . OXYGEN Inhale 2 L into the lungs continuous.   . pantoprazole (PROTONIX) 20 MG tablet TAKE ONE TABLET BY MOUTH TWICE DAILY (Patient taking differently: TAKE ONE TABLET (20 MG)  BY MOUTH TWICE DAILY) 01/31/2017: #60/30 days filled 01/20/17 [facility]  . ranitidine (ZANTAC) 300 MG tablet Take 1 tablet (300 mg total) by mouth at bedtime. 01/31/2017: #30 filled 12/04/16 [facility]  . rOPINIRole (REQUIP) 1 MG tablet TAKE 1 TABLET BY MOUTH  THREE TIMES DAILY (Patient taking differently: TAKE 1 TABLET (1 MG) BY MOUTH THREE TIMES DAILY) 01/31/2017: #270/90 days filled 11/14/16 [facility]  . spironolactone (ALDACTONE) 25 MG tablet Take 25 mg by mouth daily. 01/31/2017: #30 filled 12/04/16 [facility]   No facility-administered encounter medications on file as of 02/04/2017.     Functional Status:  In your present state of health, do you have any difficulty performing the following activities: 01/29/2017 01/23/2017  Hearing? N N  Vision? N N  Comment - -  Difficulty concentrating or making decisions? N N  Walking or climbing stairs? N N  Dressing or bathing? N N  Doing errands, shopping? N N  Some recent data might be hidden    Fall/Depression Screening:  PHQ 2/9 Scores 02/04/2017 01/23/2017 12/09/2016 11/21/2016 10/10/2016 08/22/2016 08/06/2016  PHQ - 2 Score 2 2 0 0 1 1 1   PHQ- 9 Score 8 8 - - - - -    Assessment: [facility] CSW previously involved with patient and was assisting family with LTC placement. However, CSW mailed outreach barrier to family on 01/14/17 after making several unsuccessful outreach calls. CSW had not yet completed case closure yet before receiving request from [facility] on 02/03/17 for SNF follow-up. CSW is happy to re-engage family into [facility]. CSW arrived at [facility] and successfully completed SNF visit with patient and daughter. Daughter reports that she feels that patient needs to be on different psych medications. She reports that she feels that the abilify medication is making patient too tired during the day and that the depakote medication is not effective. Daughter reports that she has spent time researching those medications and saw online that they may interact with some of her current medications which is concerning to family. Patient has psychiatrist appointment at [facility] tomorrow on 02/05/17. Daughter is agreeable to attend appointment and bring notes to help remind her to  remember her medication questions. Family feel that patient has already adjusted well to [facility]. Patient continues to need LTC placement but may not have a skillable need for placement at a nursing facility. CSW discussed ALF options with family. Daughter previously applied for Medicaid last month but was denied services because daughter did not turn in required documents in time and therefore application was closed. CSW informed family that a new application will have to be completed. Daughter reports that she remembers that the DSS caseworker wanted her to gain information on patient's current life insurance policy but she was unable to find any documentation. Daughter was encouraged to contact life insurance company to request records and she was agreeable to do so. CSW provided family with LTC Medicaid application and encouraged daughter to go to DSS as soon as possible to apply for services.  CSW met with SNF social worker [name]. She reports that she is aware that patient has a Level  2 PASSAR and that patient would not be appropriate to be placed at their SNF for long term care. However, SNF social worker is agreeable to assist family with transition to ALF and appreciated update. SNF social worker reports having their first care team meeting with patient's family on 02/06/17 at 10:30.   [facility] CM Care Plan Problem One     Most Recent Value  Care Plan Problem One  Need for LTC placement due to recent falls and limited support within the home  Role Documenting the Problem One  Clinical Social Worker  Care Plan for Problem One  Active  [facility] Long Term Goal   Patient will be placed at a LTC facility within 90 days due to frequent falls and lack of support within the home  [facility] Long Term Goal Start Date  02/04/17  Interventions for Problem One Long Term Goal  Goal has been readjusted since re-engaging back into [facility] services. Patient is not at [facility] but goal still remains the same, patient is  in need of LTC placement at either a nursing facility or ALF. CSW provided family with medicaid applicaiton and coordinated with SNF Education officer, museum. CSW will monitor process closely.  [facility] CM Short Term Goal #1   Family will reapply for Medicaid services within 30 days for LTC placement as evidenced by family's self report  [facility] CM Short Term Goal #1 Start Date  02/04/17  Interventions for Short Term Goal #1  Family did not turn in required documents to DSS in time last time they applied for Medicaid and therefore needs to apply again. CSW provided family with a LTC Medicaid application and educated them again on the entire Medicaid application process. Family plan to go to DSS very soon to apply for services. CSW updated SNF social worker.  [facility] CM Short Term Goal #2   Patient will attend all scheduled medical and mental health appointments over the next 30 days as evidenced by daughter and patient's self report  [facility] CM Short Term Goal #2 Start Date  02/04/17  Interventions for Short Term Goal #2  CSW completed review all of upcoming appointments with family. Patient has upcoming psychiatrist appointment and daughter plans to attend. [facility] Calendar provided to help keep up with appointments. [facility] Calendar review completed.     Plan-CSW will follow up within one week. CSW will route encounter to PCP. CSW will monitor patient closely while at SNF.  [name], BSW, MSW, [email or web address] Phone: [phone] Fax: [phone]

## 2017-02-05 DIAGNOSIS — F333 Major depressive disorder, recurrent, severe with psychotic symptoms: Secondary | ICD-10-CM | POA: Diagnosis not present

## 2017-02-09 ENCOUNTER — Telehealth: Payer: Self-pay | Admitting: Family Medicine

## 2017-02-09 ENCOUNTER — Other Ambulatory Visit: Payer: Self-pay | Admitting: Licensed Clinical Social Worker

## 2017-02-09 DIAGNOSIS — J441 Chronic obstructive pulmonary disease with (acute) exacerbation: Secondary | ICD-10-CM | POA: Diagnosis not present

## 2017-02-09 DIAGNOSIS — I10 Essential (primary) hypertension: Secondary | ICD-10-CM | POA: Diagnosis not present

## 2017-02-09 DIAGNOSIS — R5381 Other malaise: Secondary | ICD-10-CM | POA: Diagnosis not present

## 2017-02-09 NOTE — Patient Outreach (Signed)
[facility]) Care Management  02/09/2017  Bonnie Henry 1940-04-05 [id number]  Assessment- CSW received text message from patient's daughter Bonnie Henry on 02/09/17. She reports that she went to DSS and was informed that patient would not qualify for memory care assisted living but that she should qualify for long term skilled nursing. Patient's social security check went up this year which may have affected this accordingly to DSS. CSW scheduled SNF visit with daughter and patient.  Plan-CSW will complete SNF visit tomorrow and meet with SNF social worker.  Bonnie Henry, BSW, MSW, [email or web address] Phone: [phone] Fax: [phone]

## 2017-02-09 NOTE — Telephone Encounter (Signed)
Patients daughter notified that they can fill out at the facility she is currently at for rehab

## 2017-02-09 NOTE — Telephone Encounter (Signed)
Copied from [place]. Topic: Quick Communication - See Telephone Encounter >> Feb 09, 2017  9:53 AM Aurelio Brash B wrote: CRM for notification. See Telephone encounter for:  Akeela the pts daugther called asking for Dr Delight Ovens nurse to call her regarding  FL2 papers    02/09/17.

## 2017-02-10 ENCOUNTER — Other Ambulatory Visit: Payer: Self-pay | Admitting: Licensed Clinical Social Worker

## 2017-02-10 NOTE — Patient Outreach (Signed)
[facility]) Care Management  [facility] Social Work  02/10/2017  MALLOREE RABOIN 1940-12-30 [id number]  Encounter Medications:  Outpatient Encounter Medications as of 02/10/2017  Medication Sig Note  . albuterol (ACCUNEB) 0.63 MG/3ML nebulizer solution Take 1 ampule by nebulization every 6 (six) hours as needed for wheezing or shortness of breath.    Marland Kitchen albuterol (PROAIR HFA) 108 (90 BASE) MCG/ACT inhaler Inhale 2 puffs into the lungs every 4 (four) hours as needed for wheezing or shortness of breath.    Marland Kitchen amLODipine (NORVASC) 5 MG tablet Take 5 mg by mouth daily.    . ARIPiprazole (ABILIFY) 2 MG tablet Take 2 mg by mouth daily.  01/31/2017: #30 filled 01/16/17 [facility]  . Ascorbic Acid (VITAMIN C PO) Take 1 tablet by mouth 3 (three) times a week. MWF   . aspirin EC 81 MG tablet Take 81 mg by mouth daily.   Marland Kitchen atorvastatin (LIPITOR) 80 MG tablet Take 1 tablet (80 mg total) by mouth at bedtime. 01/31/2017: #90 filled 12/21/16 [facility]  . budesonide (PULMICORT) 0.5 MG/2ML nebulizer solution Take 0.5 mg by nebulization 2 (two) times daily.    . bumetanide (BUMEX) 0.5 MG tablet Take 0.5 mg by mouth daily as needed (fluid).    . carvedilol (COREG) 12.5 MG tablet Take 12.5 mg by mouth 2 (two) times daily with a meal.  01/31/2017: #60/30 days filled 12/04/16 [facility]  . divalproex (DEPAKOTE ER) 250 MG 24 hr tablet Take 1 tablet (250 mg total) by mouth 2 (two) times daily.   . DULoxetine (CYMBALTA) 60 MG capsule Take 1 capsule (60 mg total) by mouth daily. 01/31/2017: #30 filled 01/16/17 [facility]  . fluticasone (FLONASE) 50 MCG/ACT nasal spray Place 2 sprays into both nostrils as needed for allergies.    . formoterol (PERFOROMIST) 20 MCG/2ML nebulizer solution Inhale 20 mcg into the lungs 2 (two) times daily.    Marland Kitchen gabapentin (NEURONTIN) 600 MG tablet Take 1 tablet (600 mg total) 2 (two) times daily by mouth. 01/31/2017: #120/30 days filled 01/20/17 [facility]  . Hypromellose (ARTIFICIAL TEARS OP)  Place 1 drop into both eyes as needed (dry eyes).    . insulin NPH Human (HUMULIN N,NOVOLIN N) 100 UNIT/ML injection Inject 24 Units into the skin at bedtime.  12/11/2016: Not filled since 06/12/16. Possible fill OTC  . insulin regular (NOVOLIN R,HUMULIN R) 100 units/mL injection Inject 28 Units 3 (three) times daily before meals into the skin. Based on sliding scale    . levothyroxine (SYNTHROID, LEVOTHROID) 112 MCG tablet Take 1 tablet (112 mcg total) by mouth every other day. (alternate with the 100 mcg strength) (Patient taking differently: Take 112 mcg by mouth See admin instructions. Take 1 tablet (112 mcg) by mouth before breakfast on Friday, Saturday, Sunday (take 100 mcg other days)) 01/31/2017: #15/15 days filled 01/16/17 [facility]  . meclizine (ANTIVERT) 25 MG tablet Take 0.5-1 tablets (12.5-25 mg total) by mouth 3 (three) times daily as needed for dizziness.   . metoCLOPramide (REGLAN) 5 MG tablet Take 1 tablet (5 mg total) by mouth 3 (three) times daily before meals for 3 days.   . montelukast (SINGULAIR) 10 MG tablet TAKE ONE TABLET BY MOUTH ONCE DAILY (Patient taking differently: TAKE ONE TABLET (10 MG) BY MOUTH ONCE DAILY) 01/31/2017: #30 filled 12/04/16 [facility]  . nitroGLYCERIN (NITROSTAT) 0.4 MG SL tablet Place 1 tablet (0.4 mg total) every 5 (five) minutes as needed under the tongue for chest pain. Max of 3 pills total, call 911 01/23/2017: Can't  afford this medication   . Omega-3 Fatty Acids (FISH OIL) 1200 MG CAPS Take 1 capsule by mouth 2 (two) times a week.    . OXYGEN Inhale 2 L into the lungs continuous.   . pantoprazole (PROTONIX) 20 MG tablet TAKE ONE TABLET BY MOUTH TWICE DAILY (Patient taking differently: TAKE ONE TABLET (20 MG)  BY MOUTH TWICE DAILY) 01/31/2017: #60/30 days filled 01/20/17 [facility]  . ranitidine (ZANTAC) 300 MG tablet Take 1 tablet (300 mg total) by mouth at bedtime. 01/31/2017: #30 filled 12/04/16 [facility]  . rOPINIRole (REQUIP) 1 MG tablet TAKE 1 TABLET BY MOUTH  THREE TIMES DAILY (Patient taking differently: TAKE 1 TABLET (1 MG) BY MOUTH THREE TIMES DAILY) 01/31/2017: #270/90 days filled 11/14/16 [facility]  . spironolactone (ALDACTONE) 25 MG tablet Take 25 mg by mouth daily. 01/31/2017: #30 filled 12/04/16 [facility]   No facility-administered encounter medications on file as of 02/10/2017.     Functional Status:  In your present state of health, do you have any difficulty performing the following activities: 01/29/2017 01/23/2017  Hearing? N N  Vision? N N  Comment - -  Difficulty concentrating or making decisions? N N  Walking or climbing stairs? N N  Dressing or bathing? N N  Doing errands, shopping? N N  Some recent data might be hidden    Fall/Depression Screening:  PHQ 2/9 Scores 02/04/2017 01/23/2017 12/09/2016 11/21/2016 10/10/2016 08/22/2016 08/06/2016  PHQ - 2 Score 2 2 0 0 _0 PHQ- 9 Score 8 8 - - - - -    Assessment: CSW completed SNF visit at [facility] on 02/10/17. CSW left a voice message with covering SNF social worker Dawn on 02/09/17. CSW met with patient's daughter (caregiver) in the front of the office. CSW was informed that daughter did NOT go to DSS yesterday but that she did contact DSS and spoke with someone. She reports that patient's life insurance company has mailed out a copy of patient's policy. She will notify CSW once she retrieves document. CSW was informed that patient attended her psychiatrist appointment this week and that her abilify medication was increased. Daughter states that no other medications were adjusted. Patient completed blood work at [facility] this week (to check on Depakote) and family is waiting to hear back the results. Daughter reports that patient's next appointment is in 8 weeks but daughter request an earlier appointment if needed. CSW reviewed upcoming medical appointments. Daughter shares that patient has an appointment with a psychologist, Dr. Marlane Hatcher this month as well.   CSW, patient's daughter and  covering SNF social worker [place] met and discussed LTC placement. Patient's daughter was encouraged to go to DSS as soon as possible to apply for Medicaid. Patient's daughter was informed that patient's income is now over the income requirements for ALF Medicaid and therefore patient will only qualify for LTC Medicaid. Patient's social security increased to $1,700 which is over the required amount ($1,500) for Special Assistance Medicaid. Patient's daughter was provided a list of LTC Nursing facilities within the area. Review of list completed. She was advised to contact [facility] to provide update that patient is not able to qualify for Special Assistance Medicaid. Patient's daughter is agreeable to go to DSS tomorrow to re-apply for Medicaid for patient. Covering SNF social worker is agreeable to give patient's daughter a copy of FL2 in order to take with her to DSS.   [facility] CM Care Plan Problem One     Most Recent Value  Care Plan Problem One  Need for LTC placement due to recent falls and limited support within the home  Role Documenting the Problem One  [place] for Problem One  Active  [facility] Long Term Goal   Patient will be placed at a LTC facility within 90 days due to frequent falls and lack of support within the home  [facility] Long Term Goal Start Date  02/04/17  Interventions for Problem One Long Term Goal  CSW met with covering SNF social worker today and discussed plan for LTC placement. Resources provided. Daughter plans to go to DSS tomorrow to apply for Medicaid. Covering SNF social worker will provide daughter with FL2 copy.  [facility] CM Short Term Goal #1   Family will reapply for Medicaid services within 30 days for LTC placement as evidenced by family's self report  [facility] CM Short Term Goal #1 Start Date  02/04/17  Interventions for Short Term Goal #1  Family agreeable to go to DSS office tomorrow and re-apply for Medicaid  [facility] CM Short Term Goal #2   Patient will  attend all scheduled medical and mental health appointments over the next 30 days as evidenced by daughter and patient's self report  [facility] CM Short Term Goal #2 Start Date  02/04/17  Interventions for Short Term Goal #2  Goal reviewed again during SNF visit today. Patient has attended all scheduled medical appointments thus far.     Plan: CSW will follow up with patient's daughter within one week. CSW will continue to follow patient closely and provide social work assistance.  [name], BSW, MSW, [email or web address] Phone: [phone] Fax: [phone]

## 2017-02-12 ENCOUNTER — Other Ambulatory Visit: Payer: Self-pay | Admitting: Licensed Clinical Social Worker

## 2017-02-12 DIAGNOSIS — M1711 Unilateral primary osteoarthritis, right knee: Secondary | ICD-10-CM | POA: Diagnosis not present

## 2017-02-12 NOTE — Patient Outreach (Addendum)
[facility]) Care Management  02/12/2017  ERINN MENDOSA 1940-02-08 [id number]  Assessment- CSW received incoming text message (daughter's preferred way of communication) from patient's daughter on 02/12/17. Patient's daughter reports that she went to DSS on 02/11/17 and successfully applied for LTC Medicaid for patient. She reports that patient's caseworker is [employer].   Plan-CSW will follow up with SNF social worker upon her return to the office next week. CSW will continue to assist family with LTC placement.  [name], BSW, MSW, [email or web address] Phone: [phone] Fax: [phone]

## 2017-02-17 ENCOUNTER — Other Ambulatory Visit: Payer: Self-pay | Admitting: Licensed Clinical Social Worker

## 2017-02-17 DIAGNOSIS — J189 Pneumonia, unspecified organism: Secondary | ICD-10-CM | POA: Diagnosis not present

## 2017-02-17 DIAGNOSIS — J441 Chronic obstructive pulmonary disease with (acute) exacerbation: Secondary | ICD-10-CM | POA: Diagnosis not present

## 2017-02-17 NOTE — Patient Outreach (Signed)
[facility] [place] [facility]) Care Management  [facility] Social Work  02/17/2017  Bonnie Henry [birth year] [id number] Encounter Medications:  Outpatient Encounter Medications as of 02/17/2017  Medication Sig Note  . albuterol (ACCUNEB) 0.63 MG/3ML nebulizer solution Take 1 ampule by nebulization every 6 (six) hours as needed for wheezing or shortness of breath.    Marland Kitchen albuterol (PROAIR HFA) 108 (90 BASE) MCG/ACT inhaler Inhale 2 puffs into the lungs every 4 (four) hours as needed for wheezing or shortness of breath.    Marland Kitchen amLODipine (NORVASC) 5 MG tablet Take 5 mg by mouth daily.    . ARIPiprazole (ABILIFY) 2 MG tablet Take 2 mg by mouth daily.  01/31/2017: #30 filled 01/16/17 [facility]  . Ascorbic Acid (VITAMIN C PO) Take 1 tablet by mouth 3 (three) times a week. MWF   . aspirin EC 81 MG tablet Take 81 mg by mouth daily.   Marland Kitchen atorvastatin (LIPITOR) 80 MG tablet Take 1 tablet (80 mg total) by mouth at bedtime. 01/31/2017: #90 filled 12/21/16 [facility]  . budesonide (PULMICORT) 0.5 MG/2ML nebulizer solution Take 0.5 mg by nebulization 2 (two) times daily.    . bumetanide (BUMEX) 0.5 MG tablet Take 0.5 mg by mouth daily as needed (fluid).    . carvedilol (COREG) 12.5 MG tablet Take 12.5 mg by mouth 2 (two) times daily with a meal.  01/31/2017: #60/30 days filled 12/04/16 [facility]  . divalproex (DEPAKOTE ER) 250 MG 24 hr tablet Take 1 tablet (250 mg total) by mouth 2 (two) times daily.   . DULoxetine (CYMBALTA) 60 MG capsule Take 1 capsule (60 mg total) by mouth daily. 01/31/2017: #30 filled 01/16/17 [facility]  . fluticasone (FLONASE) 50 MCG/ACT nasal spray Place 2 sprays into both nostrils as needed for allergies.    . formoterol (PERFOROMIST) 20 MCG/2ML nebulizer solution Inhale 20 mcg into the lungs 2 (two) times daily.    Marland Kitchen gabapentin (NEURONTIN) 600 MG tablet Take 1 tablet (600 mg total) 2 (two) times daily by mouth. 01/31/2017: #120/30 days filled 01/20/17 [facility]  . Hypromellose (ARTIFICIAL TEARS OP)  Place 1 drop into both eyes as needed (dry eyes).    . insulin NPH Human (HUMULIN N,NOVOLIN N) 100 UNIT/ML injection Inject 24 Units into the skin at bedtime.  12/11/2016: Not filled since 06/12/16. Possible fill OTC  . insulin regular (NOVOLIN R,HUMULIN R) 100 units/mL injection Inject 28 Units 3 (three) times daily before meals into the skin. Based on sliding scale    . levothyroxine (SYNTHROID, LEVOTHROID) 112 MCG tablet Take 1 tablet (112 mcg total) by mouth every other day. (alternate with the 100 mcg strength) (Patient taking differently: Take 112 mcg by mouth See admin instructions. Take 1 tablet (112 mcg) by mouth before breakfast on Friday, Saturday, Sunday (take 100 mcg other days)) 01/31/2017: #15/15 days filled 01/16/17 [facility]  . meclizine (ANTIVERT) 25 MG tablet Take 0.5-1 tablets (12.5-25 mg total) by mouth 3 (three) times daily as needed for dizziness.   . metoCLOPramide (REGLAN) 5 MG tablet Take 1 tablet (5 mg total) by mouth 3 (three) times daily before meals for 3 days.   . montelukast (SINGULAIR) 10 MG tablet TAKE ONE TABLET BY MOUTH ONCE DAILY (Patient taking differently: TAKE ONE TABLET (10 MG) BY MOUTH ONCE DAILY) 01/31/2017: #30 filled 12/04/16 [facility]  . nitroGLYCERIN (NITROSTAT) 0.4 MG SL tablet Place 1 tablet (0.4 mg total) every 5 (five) minutes as needed under the tongue for chest pain. Max of 3 pills total, call 911 01/23/2017: Can't afford  this medication   . Omega-3 Fatty Acids (FISH OIL) 1200 MG CAPS Take 1 capsule by mouth 2 (two) times a week.    . OXYGEN Inhale 2 L into the lungs continuous.   . pantoprazole (PROTONIX) 20 MG tablet TAKE ONE TABLET BY MOUTH TWICE DAILY (Patient taking differently: TAKE ONE TABLET (20 MG)  BY MOUTH TWICE DAILY) 01/31/2017: #60/30 days filled 01/20/17 [facility]  . ranitidine (ZANTAC) 300 MG tablet Take 1 tablet (300 mg total) by mouth at bedtime. 01/31/2017: #30 filled 12/04/16 [facility]  . rOPINIRole (REQUIP) 1 MG tablet TAKE 1 TABLET BY MOUTH  THREE TIMES DAILY (Patient taking differently: TAKE 1 TABLET (1 MG) BY MOUTH THREE TIMES DAILY) 01/31/2017: #270/90 days filled 11/14/16 [facility]  . spironolactone (ALDACTONE) 25 MG tablet Take 25 mg by mouth daily. 01/31/2017: #30 filled 12/04/16 [facility]   No facility-administered encounter medications on file as of 02/17/2017.     Functional Status:  In your present state of health, do you have any difficulty performing the following activities: 01/29/2017 01/23/2017  Hearing? N N  Vision? N N  Comment - -  Difficulty concentrating or making decisions? N N  Walking or climbing stairs? N N  Dressing or bathing? N N  Doing errands, shopping? N N  Some recent data might be hidden    Fall/Depression Screening:  PHQ 2/9 Scores 02/04/2017 01/23/2017 12/09/2016 11/21/2016 10/10/2016 08/22/2016 08/06/2016  PHQ - 2 Score 2 2 0 0 1 1 1   PHQ- 9 Score 8 8 - - - - -    Assessment: CSW completed SNF visit on 02/17/17 at [facility]. CSW went to patient's room and daughter was there. Daughter reports that she had patient sign two consent forms (for DSS caseworker), took patient to bank to get a bank statement, and was able to get a life insurance policy number and information and provide to DSS in order to complete Medicaid application. Daughter shares that she also scanned in several medical bills as well at [place]. Patient has a Medicaid pending number at this time. CSW provided positive reinforcement as daughter has completed all needed steps for applying for LTC Medicaid. CSW provided family with a list of local LTC nursing facilities. Daughter reports already having a list. Daughter reports that she has updated SNF social workers.  CSW met with SNF social worker Dawn. Dawn reports that SNF social worker [name] is in a care team planning meeting. However, Dawn was able to state that they are working on getting an FL2 and then will fax it to [place] facilities of family's choosing. SNF social worker reports  having everything she needs to assist with LTC placement.  [facility] CM Care Plan Problem One     Most Recent Value  Care Plan Problem One  Need for LTC placement due to recent falls and limited support within the home  Role Documenting the Problem One  [place] for Problem One  Active  [facility] Long Term Goal   Patient will be placed at a LTC facility within 90 days due to frequent falls and lack of support within the home  [facility] Long Term Goal Start Date  02/04/17  Interventions for Problem One Long Term Goal  CSW met with SNF social worker and discussed case. SNF social worker will gain FL2 and fax out to preferrred facilities. CSW met with patient and family as well and reviewed plan.   [facility] CM Short Term Goal #1   Family will  reapply for Medicaid services within 30 days for LTC placement as evidenced by family's self report  [facility] CM Short Term Goal #1 Start Date  02/04/17  Interventions for Short Term Goal #1  Daughter successfully applied for LTC Medicaid for patient and is still working with caseworker. Medicaid pending number received.   [facility] CM Short Term Goal #2   Patient will attend all scheduled medical and mental health appointments over the next 30 days as evidenced by daughter and patient's self report  [facility] CM Short Term Goal #2 Start Date  02/04/17  Interventions for Short Term Goal #2  Goal review. Patient was feeling sick today and may not be able to go to PT.     Plan: CSW will follow up within one week and continue to assist with LTC placement.  [name], BSW, MSW, [email or web address] Phone: [phone] Fax: [phone]

## 2017-02-18 ENCOUNTER — Other Ambulatory Visit: Payer: Self-pay | Admitting: Licensed Clinical Social Worker

## 2017-02-18 NOTE — Patient Outreach (Signed)
[place] [facility]) Care Management  02/18/2017  Bonnie Henry [birth year] [id number]  Assessment-CSW received incoming text message from patient's caregiver/daughter. She reports that patient's current Medicaid caseworker is Joretta Bachelor and her number is [phone]. CSW completed call to caseworker but CSW was informed that she would need authorization from patient or family before discussing patient's Medicaid status. CSW expressed understanding. CSW sent text message back to patient's daughter with update. Daughter reports that she has a care team planning meeting today as well.  Plan-CSW will continue to assist with LTC placement.  [name], BSW, MSW, [email or web address] Phone: [phone] Fax: [phone]

## 2017-02-19 ENCOUNTER — Other Ambulatory Visit: Payer: Self-pay | Admitting: Licensed Clinical Social Worker

## 2017-02-19 ENCOUNTER — Encounter: Payer: Self-pay | Admitting: Psychology

## 2017-02-19 DIAGNOSIS — J189 Pneumonia, unspecified organism: Secondary | ICD-10-CM | POA: Diagnosis not present

## 2017-02-19 DIAGNOSIS — J441 Chronic obstructive pulmonary disease with (acute) exacerbation: Secondary | ICD-10-CM | POA: Diagnosis not present

## 2017-02-19 NOTE — Patient Outreach (Signed)
[place] [facility]) Care Management  02/19/2017  CHARMIAN FORBIS 02-03-1940 [id number]   Assessment- CSW completed call to [place] worker. CSW was unable to reach either social worker but left a detailed voice message requesting a return call with updates on LTC placement.  Plan-CSW will await for return call from SNF social worker with update.  [name], BSW, MSW, [email or web address] Phone: [phone] Fax: [phone]

## 2017-02-20 ENCOUNTER — Other Ambulatory Visit: Payer: Self-pay | Admitting: Licensed Clinical Social Worker

## 2017-02-20 NOTE — Patient Outreach (Signed)
[facility]) Care Management  02/20/2017  MAKINLEY MUSCATO 1940/11/07 [id number]  Assessment- CSW received incoming call and voice message from [place]. [place] reports that she was unable to attend patient's care team planning meeting but that she has been in contact with patient's daughter. She shares that she faxed patient's paperwork to [facility] facility on Tuesday. However, daughter was suppose to get back to her and let her know what other facilities she would like her paperwork to be faxed to. She has not heard back from daughter with that list of facilities of her choosing. SW states that patient has a temporarily level 2 passr and that again this will make it very difficult to place her for long term care nursing when her mental health needs exceed her physical needs. CSW was informed that PT plans to end therapy on 02/23/17. CSW completed a return call back but was unable to reach SNF social worker. CSW left a message informing SNF social worker that Medicaid still needs FL2 in order to complete patient's Medicaid application. CSW wanted to see if this was completed. CSW also listed several facilities that SNF SW can fax patient's paperwork to in the case that [facility] declines patient for placement. CSW wants to make sure patient is not left in the cracks and has a safe discharge plan. CSW send text message to patient's daughter with updates listed above. Daughter reports that she has now been informed that patient will be discharging back home on Sunday. Daughter shares that she gave [name] a list of facilities to fax FL2. CSW contacted [facility] and was informed that they do not have any long term beds available and do not expect any to become available anytime soon. CSW contacted [facility] and was informed that they have not received any paperwork on patient. CSW completed call back to patient's daughter and provided update. Daughter  reports that she is now aware that [facility] declined patient for LTC placement as well. CSW completed call to [name], SNF social worker but was unable to reach her. CSW left a voice message and encouraged SNF social worker to fax paperwork to [facility], [place], [place], [place], [place] and [facility].   Plan- CSW will follow up with patient next week post SNF discharge.   [name], BSW, MSW, [email or web address] Phone: [phone] Fax: [phone]

## 2017-02-22 NOTE — Telephone Encounter (Signed)
Closing out old refill note; appears empty

## 2017-02-23 ENCOUNTER — Other Ambulatory Visit: Payer: Self-pay | Admitting: Licensed Clinical Social Worker

## 2017-02-23 DIAGNOSIS — J189 Pneumonia, unspecified organism: Secondary | ICD-10-CM | POA: Diagnosis not present

## 2017-02-23 NOTE — Patient Outreach (Signed)
[facility]) Care Management  02/23/2017  Bonnie Henry [birth year] [id number]  Assessment- CSW received update from patient's daughter that patient did not discharge from SNF yesterday and will be discharging home at the end of the day from [facility]. Daughter agreeable to get [facility] paperwork before leaving in order to work on LTC placement post SNF discharge.  Plan-CSW will continue to provide social work assistance and support.  [name], BSW, MSW, [email or web address] Phone: [phone] Fax: [phone]

## 2017-02-24 ENCOUNTER — Other Ambulatory Visit: Payer: Self-pay | Admitting: Licensed Clinical Social Worker

## 2017-02-24 NOTE — Patient Outreach (Signed)
  [facility]) Care Management  02/24/2017  Bonnie Henry 1940-05-04 [id number]  Assessment- CSW sent text message to patient's daughter (her preferred way of communication.) Daughter replied back stating that [facility] and paperwork was faxed to [facility], [facility] and [facility]. CSW contacted each facility and was informed that they did NOT receive any paperwork on patient. CSW was informed that [facility] has no LTC beds available at this time. CSW updated patient's daughter and contacted [facility]nie [facility] worker Bonnie [facility]. She reports that she has confirmations of successful faxes that she sent to: [facility]nie [facility], [facility], [facility], [facility], [facility], Bonnie Henry. However, Bonnie Henry is agreeable to fax all information to SNF's again since they did not receive paperwork. CSW asked Bonnie Henry to add [facility] to the list as well. Bonnie Henry informed Bonnie Henry CSW that she provided patient's daughter with a copy of the FL2. CSW sent text message back to patient's daughter with final updates.  Plan-CSW will continue to assist with LTC placement.  Bonnie Henry, BSW, MSW, [email or web address] Phone: [phone] Fax: [phone]

## 2017-02-25 ENCOUNTER — Other Ambulatory Visit: Payer: Self-pay | Admitting: Licensed Clinical Social Worker

## 2017-02-25 NOTE — Patient Outreach (Signed)
[place] [facility]) Care Management  02/25/2017  Bonnie Henry November 13, 1940 [id number]  Assessment- CSW completed call to [place] but was unable to reach her. CSW left a voice message asking if [name] could please fax patient's paperwork to [place]. Fax number provided in voice message.  Plan-CSW will continue to assist family with LTC placement but many facilities of family's choosing have no LTC beds available at this time.  [name], BSW, MSW, [email or web address] Phone: [phone] Fax: [phone]

## 2017-02-25 NOTE — Patient Outreach (Addendum)
[place] [facility]) Care Management  02/25/2017  Bonnie Henry 1940/03/09 [id number]  Assessment- CSW received incoming fax from [facility] with paperwork (including FL2) on patient. CSW successfully faxed paperwork to [place] facilities of family's choosing. CSW received a voice message from [facility] and returned call back to their admissions department. CSW was unable to reach anyone but left a detailed voice message with Bonnie Henry in admissions requesting a return call. CSW received return call from [place] and discussed case. Bonnie Henry admits that it will be very difficult to place patient due to her Level 2 PASSR but that she will take the new few days to review patient's information and consider her for LTC placement. Bonnie Henry reports needing patient's Medicaid pending number. CSW sent text message to patient's daughter requesting this information in order to pass on to [facility].   Plan-CSW will await to hear back from patient's family with Medicaid information.  [name], BSW, MSW, [email or web address] Phone: [phone] Fax: [phone]

## 2017-02-26 ENCOUNTER — Ambulatory Visit
Admission: RE | Admit: 2017-02-26 | Discharge: 2017-02-26 | Disposition: A | Discharge status: Home / Self Care | Payer: Medicare Other | Source: Ambulatory Visit | Attending: Family Medicine | Admitting: Family Medicine

## 2017-02-26 ENCOUNTER — Encounter: Payer: Self-pay | Admitting: Family Medicine

## 2017-02-26 ENCOUNTER — Other Ambulatory Visit
Admission: RE | Admit: 2017-02-26 | Discharge: 2017-02-26 | Disposition: A | Discharge status: Home / Self Care | Payer: Medicare Other | Source: Ambulatory Visit | Attending: Family Medicine | Admitting: Family Medicine

## 2017-02-26 ENCOUNTER — Ambulatory Visit (INDEPENDENT_AMBULATORY_CARE_PROVIDER_SITE_OTHER): Payer: Medicare Other | Admitting: Family Medicine

## 2017-02-26 ENCOUNTER — Other Ambulatory Visit: Payer: Self-pay | Admitting: Licensed Clinical Social Worker

## 2017-02-26 ENCOUNTER — Telehealth: Payer: Self-pay | Admitting: Family Medicine

## 2017-02-26 VITALS — BP 130/76 | HR 80 | Temp 98.1°F | Resp 18 | Ht 59.0 in | Wt 196.0 lb

## 2017-02-26 DIAGNOSIS — I4891 Unspecified atrial fibrillation: Secondary | ICD-10-CM | POA: Diagnosis not present

## 2017-02-26 DIAGNOSIS — I130 Hypertensive heart and chronic kidney disease with heart failure and stage 1 through stage 4 chronic kidney disease, or unspecified chronic kidney disease: Secondary | ICD-10-CM | POA: Diagnosis not present

## 2017-02-26 DIAGNOSIS — E1165 Type 2 diabetes mellitus with hyperglycemia: Secondary | ICD-10-CM | POA: Diagnosis not present

## 2017-02-26 DIAGNOSIS — H04123 Dry eye syndrome of bilateral lacrimal glands: Secondary | ICD-10-CM | POA: Diagnosis not present

## 2017-02-26 DIAGNOSIS — R4189 Other symptoms and signs involving cognitive functions and awareness: Secondary | ICD-10-CM | POA: Diagnosis not present

## 2017-02-26 DIAGNOSIS — Z6841 Body Mass Index (BMI) 40.0 and over, adult: Secondary | ICD-10-CM | POA: Diagnosis not present

## 2017-02-26 DIAGNOSIS — F333 Major depressive disorder, recurrent, severe with psychotic symptoms: Secondary | ICD-10-CM | POA: Diagnosis not present

## 2017-02-26 DIAGNOSIS — M21611 Bunion of right foot: Secondary | ICD-10-CM | POA: Diagnosis not present

## 2017-02-26 DIAGNOSIS — I5189 Other ill-defined heart diseases: Secondary | ICD-10-CM | POA: Diagnosis not present

## 2017-02-26 DIAGNOSIS — E1140 Type 2 diabetes mellitus with diabetic neuropathy, unspecified: Secondary | ICD-10-CM

## 2017-02-26 DIAGNOSIS — I5089 Other heart failure: Secondary | ICD-10-CM | POA: Diagnosis not present

## 2017-02-26 DIAGNOSIS — J449 Chronic obstructive pulmonary disease, unspecified: Secondary | ICD-10-CM

## 2017-02-26 DIAGNOSIS — F03A0 Unspecified dementia, mild, without behavioral disturbance, psychotic disturbance, mood disturbance, and anxiety: Secondary | ICD-10-CM

## 2017-02-26 DIAGNOSIS — E1122 Type 2 diabetes mellitus with diabetic chronic kidney disease: Secondary | ICD-10-CM | POA: Diagnosis not present

## 2017-02-26 DIAGNOSIS — J9611 Chronic respiratory failure with hypoxia: Secondary | ICD-10-CM

## 2017-02-26 DIAGNOSIS — M79672 Pain in left foot: Secondary | ICD-10-CM | POA: Diagnosis not present

## 2017-02-26 DIAGNOSIS — F320 Major depressive disorder, single episode, mild: Secondary | ICD-10-CM | POA: Diagnosis not present

## 2017-02-26 DIAGNOSIS — R634 Abnormal weight loss: Secondary | ICD-10-CM | POA: Diagnosis not present

## 2017-02-26 DIAGNOSIS — M79671 Pain in right foot: Secondary | ICD-10-CM | POA: Diagnosis not present

## 2017-02-26 DIAGNOSIS — N182 Chronic kidney disease, stage 2 (mild): Secondary | ICD-10-CM | POA: Diagnosis not present

## 2017-02-26 DIAGNOSIS — J189 Pneumonia, unspecified organism: Secondary | ICD-10-CM

## 2017-02-26 DIAGNOSIS — G4700 Insomnia, unspecified: Secondary | ICD-10-CM | POA: Diagnosis not present

## 2017-02-26 DIAGNOSIS — J45909 Unspecified asthma, uncomplicated: Secondary | ICD-10-CM | POA: Diagnosis not present

## 2017-02-26 DIAGNOSIS — R410 Disorientation, unspecified: Secondary | ICD-10-CM | POA: Diagnosis not present

## 2017-02-26 DIAGNOSIS — K1379 Other lesions of oral mucosa: Secondary | ICD-10-CM | POA: Diagnosis not present

## 2017-02-26 DIAGNOSIS — G9340 Encephalopathy, unspecified: Secondary | ICD-10-CM | POA: Diagnosis not present

## 2017-02-26 DIAGNOSIS — F0390 Unspecified dementia without behavioral disturbance: Secondary | ICD-10-CM | POA: Diagnosis not present

## 2017-02-26 DIAGNOSIS — H903 Sensorineural hearing loss, bilateral: Secondary | ICD-10-CM | POA: Diagnosis not present

## 2017-02-26 DIAGNOSIS — R41841 Cognitive communication deficit: Secondary | ICD-10-CM | POA: Diagnosis not present

## 2017-02-26 DIAGNOSIS — G40901 Epilepsy, unspecified, not intractable, with status epilepticus: Secondary | ICD-10-CM | POA: Diagnosis not present

## 2017-02-26 DIAGNOSIS — J9811 Atelectasis: Secondary | ICD-10-CM | POA: Diagnosis not present

## 2017-02-26 DIAGNOSIS — F411 Generalized anxiety disorder: Secondary | ICD-10-CM | POA: Diagnosis not present

## 2017-02-26 DIAGNOSIS — F338 Other recurrent depressive disorders: Secondary | ICD-10-CM | POA: Diagnosis not present

## 2017-02-26 DIAGNOSIS — E039 Hypothyroidism, unspecified: Secondary | ICD-10-CM

## 2017-02-26 DIAGNOSIS — Z794 Long term (current) use of insulin: Secondary | ICD-10-CM

## 2017-02-26 DIAGNOSIS — L603 Nail dystrophy: Secondary | ICD-10-CM | POA: Diagnosis not present

## 2017-02-26 DIAGNOSIS — T883XXD Malignant hyperthermia due to anesthesia, subsequent encounter: Secondary | ICD-10-CM | POA: Diagnosis not present

## 2017-02-26 DIAGNOSIS — D539 Nutritional anemia, unspecified: Secondary | ICD-10-CM | POA: Diagnosis not present

## 2017-02-26 DIAGNOSIS — M6281 Muscle weakness (generalized): Secondary | ICD-10-CM | POA: Diagnosis not present

## 2017-02-26 DIAGNOSIS — N390 Urinary tract infection, site not specified: Secondary | ICD-10-CM | POA: Diagnosis not present

## 2017-02-26 DIAGNOSIS — E119 Type 2 diabetes mellitus without complications: Secondary | ICD-10-CM | POA: Diagnosis not present

## 2017-02-26 DIAGNOSIS — R1319 Other dysphagia: Secondary | ICD-10-CM | POA: Diagnosis not present

## 2017-02-26 DIAGNOSIS — G6289 Other specified polyneuropathies: Secondary | ICD-10-CM | POA: Diagnosis not present

## 2017-02-26 DIAGNOSIS — E1121 Type 2 diabetes mellitus with diabetic nephropathy: Secondary | ICD-10-CM | POA: Diagnosis not present

## 2017-02-26 DIAGNOSIS — Z7189 Other specified counseling: Secondary | ICD-10-CM | POA: Diagnosis not present

## 2017-02-26 DIAGNOSIS — E0800 Diabetes mellitus due to underlying condition with hyperosmolarity without nonketotic hyperglycemic-hyperosmolar coma (NKHHC): Secondary | ICD-10-CM | POA: Diagnosis not present

## 2017-02-26 DIAGNOSIS — M1711 Unilateral primary osteoarthritis, right knee: Secondary | ICD-10-CM | POA: Diagnosis not present

## 2017-02-26 DIAGNOSIS — M21612 Bunion of left foot: Secondary | ICD-10-CM | POA: Diagnosis not present

## 2017-02-26 DIAGNOSIS — F330 Major depressive disorder, recurrent, mild: Secondary | ICD-10-CM | POA: Diagnosis not present

## 2017-02-26 DIAGNOSIS — M25561 Pain in right knee: Secondary | ICD-10-CM | POA: Diagnosis not present

## 2017-02-26 DIAGNOSIS — R0609 Other forms of dyspnea: Secondary | ICD-10-CM | POA: Diagnosis not present

## 2017-02-26 DIAGNOSIS — R918 Other nonspecific abnormal finding of lung field: Secondary | ICD-10-CM | POA: Insufficient documentation

## 2017-02-26 DIAGNOSIS — Z09 Encounter for follow-up examination after completed treatment for conditions other than malignant neoplasm: Secondary | ICD-10-CM

## 2017-02-26 DIAGNOSIS — R109 Unspecified abdominal pain: Secondary | ICD-10-CM | POA: Diagnosis not present

## 2017-02-26 DIAGNOSIS — R7309 Other abnormal glucose: Secondary | ICD-10-CM | POA: Diagnosis not present

## 2017-02-26 DIAGNOSIS — J159 Unspecified bacterial pneumonia: Secondary | ICD-10-CM | POA: Diagnosis not present

## 2017-02-26 DIAGNOSIS — I10 Essential (primary) hypertension: Secondary | ICD-10-CM | POA: Diagnosis not present

## 2017-02-26 DIAGNOSIS — R748 Abnormal levels of other serum enzymes: Secondary | ICD-10-CM | POA: Diagnosis not present

## 2017-02-26 DIAGNOSIS — R5383 Other fatigue: Secondary | ICD-10-CM | POA: Diagnosis not present

## 2017-02-26 DIAGNOSIS — G9341 Metabolic encephalopathy: Secondary | ICD-10-CM | POA: Insufficient documentation

## 2017-02-26 DIAGNOSIS — I480 Paroxysmal atrial fibrillation: Secondary | ICD-10-CM | POA: Diagnosis not present

## 2017-02-26 DIAGNOSIS — E7800 Pure hypercholesterolemia, unspecified: Secondary | ICD-10-CM | POA: Diagnosis not present

## 2017-02-26 DIAGNOSIS — R600 Localized edema: Secondary | ICD-10-CM | POA: Diagnosis not present

## 2017-02-26 DIAGNOSIS — H01001 Unspecified blepharitis right upper eyelid: Secondary | ICD-10-CM | POA: Diagnosis not present

## 2017-02-26 DIAGNOSIS — F339 Major depressive disorder, recurrent, unspecified: Secondary | ICD-10-CM | POA: Diagnosis not present

## 2017-02-26 DIAGNOSIS — Z961 Presence of intraocular lens: Secondary | ICD-10-CM | POA: Diagnosis not present

## 2017-02-26 DIAGNOSIS — I739 Peripheral vascular disease, unspecified: Secondary | ICD-10-CM | POA: Diagnosis not present

## 2017-02-26 DIAGNOSIS — R05 Cough: Secondary | ICD-10-CM | POA: Diagnosis not present

## 2017-02-26 DIAGNOSIS — R262 Difficulty in walking, not elsewhere classified: Secondary | ICD-10-CM | POA: Diagnosis not present

## 2017-02-26 DIAGNOSIS — J984 Other disorders of lung: Secondary | ICD-10-CM | POA: Diagnosis not present

## 2017-02-26 DIAGNOSIS — M3501 Sicca syndrome with keratoconjunctivitis: Secondary | ICD-10-CM | POA: Diagnosis not present

## 2017-02-26 DIAGNOSIS — J9801 Acute bronchospasm: Secondary | ICD-10-CM | POA: Diagnosis not present

## 2017-02-26 LAB — COMPREHENSIVE METABOLIC PANEL
ALT: 17 U/L (ref 14–54)
AST: 27 U/L (ref 15–41)
Albumin: 4.1 g/dL (ref 3.5–5.0)
Alkaline Phosphatase: 92 U/L (ref 38–126)
Anion gap: 10 (ref 5–15)
BUN: 18 mg/dL (ref 6–20)
CHLORIDE: 97 mmol/L — AB (ref 101–111)
CO2: 32 mmol/L (ref 22–32)
CREATININE: 1.08 mg/dL — AB (ref 0.44–1.00)
Calcium: 9.1 mg/dL (ref 8.9–10.3)
GFR calc non Af Amer: 49 mL/min — ABNORMAL LOW (ref >60)
GFR, EST AFRICAN AMERICAN: 57 mL/min — AB (ref >60)
Glucose, Bld: 196 mg/dL — ABNORMAL HIGH (ref 65–99)
POTASSIUM: 4.2 mmol/L (ref 3.5–5.1)
SODIUM: 139 mmol/L (ref 135–145)
Total Bilirubin: 1.1 mg/dL (ref 0.3–1.2)
Total Protein: 8.3 g/dL — ABNORMAL HIGH (ref 6.5–8.1)

## 2017-02-26 LAB — GLUCOSE, POCT (MANUAL RESULT ENTRY): POC GLUCOSE: 133 mg/dL — AB (ref 70–99)

## 2017-02-26 LAB — CBC
HEMATOCRIT: 39.6 % (ref 35.0–47.0)
HEMOGLOBIN: 13 g/dL (ref 12.0–16.0)
MCH: 28.4 pg (ref 26.0–34.0)
MCHC: 32.7 g/dL (ref 32.0–36.0)
MCV: 86.7 fL (ref 80.0–100.0)
PLATELETS: 179 10*3/uL (ref 150–440)
RBC: 4.57 MIL/uL (ref 3.80–5.20)
RDW: 15.5 % — ABNORMAL HIGH (ref 11.5–14.5)
WBC: 11.8 10*3/uL — ABNORMAL HIGH (ref 3.6–11.0)

## 2017-02-26 LAB — AMMONIA: Ammonia: 16 umol/L (ref 9–35)

## 2017-02-26 NOTE — Progress Notes (Signed)
Name: Bonnie Henry   MRN: [id number]    DOB: 1940-12-29   Date:02/26/2017       Progress Note  Subjective  Chief Complaint  Chief Complaint  Patient presents with  . Hospitalization Follow-up    pneumonia, not any better, was inpatient at [facility]  . Sinusitis    HPI  Bonnie Henry - patient's daughter is present and assists with the history.  Hospital Discharge Follow Up: PT was admitted to [facility] on 01/29/2017 for Acute metabolic encephalopathy, UTI, Pneumonia/COPD, was discharged and transferred to [facility] for Rehab on 02/03/2017.  Daughter declines referral to home health as she does not feel it has benefited the patient in the past.  Daughter is working with CSW to find LTC placement for the patient.  Pt notes that she has been feeling worse since leaving [facility] - endorses chills, productive cough, and sensation of ear fullness.  Daughter notes that her confusion has been improved since adjusting psychiatric medications, starting namenda, and being on antibiotics.  COPD/PNA: Pt is on 2L [place] continuous, is able to walk very little at home with a walker and gets short of breath when she ambulates.  Has 2 more days of Levaquin left s/p PNA diagnosis while at [facility].  Pulmicort, Formoterol and Albuterol Nebs are BID; has albuterol inhaler that she may use PRN as well - has not used this since discharge.  Sees Dr. Raul Del for follow up this afternoon - 02/26/2017.  Endorses ongoing dyspnea on exertion, endorses chills and fatigue. She states she is not feeling better and is in fact feeling a bit worse since leaving [facility].  UTI: Had UTI while at [facility], was treated with IV abx.  She denies urinary urgency, frequency, dysuria, or abdominal pain.  Afebrile, vitals stable in office today.  Depression: Sees [facility] for Psychiatric care - has appointment on 03/04/2017.  She was placed on Namenda while SNF care, Requip TID, Cymbalta 60mg  BID,  and Abilify 2mg  Daily, Depakote 250mg  BID.  Daughter notes decreased agitation and baseline mood since coming home on Monday.  Hypothyroidism: TSH in hospital 3 weeks ago was 11.222 - she has been taking 164mcg Fri/Sat/Sun and 131mcg on the other days.  She denies palpitations, skin/hair/nail changes; endorses cold intolerance.  Diabetes: Daughter notes that BG's have been low at home - 62, 82, 66 at home over the last few days. She is currently taking Novolog Regular 28 units TID before meals and NPH Insulin 24 units BID (Morning and Bedtime).  Daughter is unsure of when follow up with Dr. Manfred Shirts (Endocrinology) - we will work to coordinate this today.  Patient Active Problem List   Diagnosis Date Noted  . Acute encephalopathy 01/29/2017  . Abnormality of plasma protein 01/23/2017  . Dependent edema 12/22/2016  . Severe episode of recurrent major depressive disorder, with psychotic features ([place]) 12/11/2016  . Depression, major, recurrent, severe with psychosis ([place]) 12/09/2016  . Mild dementia 12/09/2016  . Aortic atherosclerosis ([place]) 12/09/2016  . Facet arthropathy, lumbar 11/21/2016  . Hyperlipidemia due to type 2 diabetes mellitus ([place]) 11/03/2016  . Cognitive impairment 08/26/2016  . Multifactorial gait disorder 08/26/2016  . Diabetic polyneuropathy associated with diabetes mellitus due to underlying condition ([place]) 08/26/2016  . Osteoarthritis of right knee 07/23/2016  . Graves' disease with exophthalmos 05/29/2016  . Medication monitoring encounter 05/26/2016  . Renal insufficiency 08/03/2015  . Anemia, iron deficiency 07/22/2015  . Metabolic encephalopathy 57/84/6962  . Chronic  respiratory failure with hypoxia ([place]) 07/21/2015  . History of right-sided carotid endarterectomy 06/10/2015  . Carotid stenosis, right 05/16/2015  . Low back pain 05/04/2015  . Chronic nonintractable headache 03/30/2015  . Memory loss, short term 03/30/2015  . Patellar subluxation 03/29/2015  .  Constipation 03/04/2015  . Lumbar stenosis with neurogenic claudication 02/23/2015  . DDD (degenerative disc disease), lumbar 02/23/2015  . Cervical radiculitis 02/23/2015  . Encounter for medication monitoring 02/20/2015  . Knee pain, bilateral 02/08/2015  . Frequent falls 01/22/2015  . 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 Chronic interstitial lung disease ([place]) 10/26/2014  . Centrilobular emphysema ([place]) 10/26/2014  . Vision blurred 09/28/2014  . Acquired exophthalmos 09/28/2014  . Diabetes mellitus with diabetic neuropathy ([place]) 08/31/2014  . Hammertoe 08/31/2014  . Foot callus 08/31/2014  . 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 Anemia 05/21/2014  . Chronic pain 03/02/2014  . Depression 03/02/2014  . Acid reflux 03/02/2014  . Glaucoma 03/02/2014  . Adult hypothyroidism 03/02/2014  . Apnea, sleep 03/02/2014  . PAT (paroxysmal atrial tachycardia) ([place]) 02/24/2014  . Unstable angina ([place]) 02/23/2014  . Type 2 diabetes with nephropathy ([place]) 02/23/2014  . HTN (hypertension) 02/23/2014  . CAD S/P remote PCI  02/23/2014  . High cholesterol 02/23/2014  . PVD- h/o LCEA 02/23/2014  . Morbid obesity ([place]) 02/23/2014  . Aortic stenosis murmur 02/23/2014  . COPD, moderate ([place]) 07/27/2013    Past Surgical History:  Procedure Laterality Date  . ARTERY BIOPSY Right 09/29/2014   Procedure: BIOPSY TEMPORAL ARTERY;  Surgeon: Angelia Mould, MD;  Location: [place];  Service: Vascular;  Laterality: Right;  . BARTHOLIN CYST MARSUPIALIZATION    . BREAST BIOPSY Left    Mastectomy  . CARDIAC CATHETERIZATION  2000's  . CAROTID ENDARTERECTOMY Left 2009  . CATARACT EXTRACTION W/ INTRAOCULAR LENS  IMPLANT, BILATERAL Bilateral ~ 2011  . CORONARY ANGIOPLASTY WITH STENT PLACEMENT  1990's X 2   "2; 1"  . ENDARTERECTOMY Right 05/16/2015   Procedure: ENDARTERECTOMY CAROTID;  Surgeon: Katha Cabal, MD;  Location: [facility];  Service: Vascular;  Laterality: Right;  .  ESOPHAGOGASTRODUODENOSCOPY (EGD) WITH PROPOFOL N/A 12/12/2014   Procedure: ESOPHAGOGASTRODUODENOSCOPY (EGD) WITH PROPOFOL;  Surgeon: Lucilla Lame, MD;  Location: [facility];  Service: Endoscopy;  Laterality: N/A;  . EYE SURGERY Bilateral    Catract Extraction with IOL  . HAMMER TOE SURGERY Left    2nd and 3rd digits  . JOINT REPLACEMENT Bilateral    Total Hip Replacement  . JOINT REPLACEMENT Left    Total Knee Replacement  . LEFT HEART CATHETERIZATION WITH CORONARY ANGIOGRAM N/A 02/24/2014   Procedure: LEFT HEART CATHETERIZATION WITH CORONARY ANGIOGRAM;  Surgeon: Leonie Man, MD;  Location: [facility] CATH LAB;  Service: Cardiovascular;  Laterality: N/A;  . MASTECTOMY Left 1990's  . SHOULDER SURGERY Right    "cleaned it out; no scope"  . THYROIDECTOMY, PARTIAL  1950's  . TOE SURGERY Right    "cut piece of bone out so toe didn't dig into my foot"  . TONSILLECTOMY  1950's  . TOTAL HIP ARTHROPLASTY Bilateral 1990's  . TOTAL KNEE ARTHROPLASTY Left 1990's  . TRIGGER FINGER RELEASE Left   . TUBAL LIGATION    . VAGINAL HYSTERECTOMY     "partial"    Family History  Problem Relation Age of Onset  . Arthritis Mother   . Cancer Mother   . Diabetes Mother   . Heart disease Mother   . Hyperlipidemia Mother   . Hypertension Mother   .  Alcohol abuse Father   . Brain cancer Father   . Arthritis Maternal Grandmother   . Alzheimer's disease Maternal Grandmother   . Diabetes Daughter   . Hyperlipidemia Daughter   . Hypertension Daughter   . Thyroid cancer Daughter   . Diabetes Son   . Diabetes Daughter   . Hypertension Daughter   . Diabetes Son   . Hypertension Son   . Hyperlipidemia Son   . Thyroid cancer Son   . Diabetes Brother   . Diabetes Brother     Social History   Socioeconomic History  . Marital status: Widowed    Spouse name: Not on file  . Number of children: Not on file  . Years of education: Not on file  . Highest education level: Not on file  Social Needs  .  Financial resource strain: Not on file  . Food insecurity - worry: Not on file  . Food insecurity - inability: Not on file  . Transportation needs - medical: Not on file  . Transportation needs - non-medical: Not on file  Occupational History  . Not on file  Tobacco Use  . Smoking status: Former Smoker    Packs/day: 1.50    Years: 44.00    Pack years: 66.00    Types: Cigarettes    Last attempt to quit: 01/06/1994    Years since quitting: 23.1  . Smokeless tobacco: Never Used  . Tobacco comment: "quit smoking cigarettes in ~ 2000"  Substance and Sexual Activity  . Alcohol use: No    Alcohol/week: 0.0 oz    Comment: 02/23/2014 "no alcohol since 1990's"  . Drug use: No  . Sexual activity: No  Other Topics Concern  . Not on file  Social History Narrative   Lives with daughter in a mobile home.  Has 4 children alive, 1 deceased.  Retired from [employer].  Education: high school.     Current Outpatient Medications:  .  albuterol (ACCUNEB) 0.63 MG/3ML nebulizer solution, Take 1 ampule by nebulization every 6 (six) hours as needed for wheezing or shortness of breath. , Disp: , Rfl:  .  albuterol (PROAIR HFA) 108 (90 BASE) MCG/ACT inhaler, Inhale 2 puffs into the lungs every 4 (four) hours as needed for wheezing or shortness of breath. , Disp: , Rfl:  .  amLODipine (NORVASC) 5 MG tablet, Take 5 mg by mouth daily. , Disp: , Rfl:  .  ARIPiprazole (ABILIFY) 2 MG tablet, Take 2 mg by mouth daily. , Disp: , Rfl:  .  Ascorbic Acid (VITAMIN C PO), Take 1 tablet by mouth 3 (three) times a week. MWF, Disp: , Rfl:  .  aspirin EC 81 MG tablet, Take 81 mg by mouth daily., Disp: , Rfl:  .  atorvastatin (LIPITOR) 80 MG tablet, Take 1 tablet (80 mg total) by mouth at bedtime., Disp: 90 tablet, Rfl: 1 .  budesonide (PULMICORT) 0.5 MG/2ML nebulizer solution, Take 0.5 mg by nebulization 2 (two) times daily. , Disp: , Rfl:  .  bumetanide (BUMEX) 0.5 MG tablet, Take 0.5 mg by mouth daily as needed (fluid). ,  Disp: , Rfl:  .  carvedilol (COREG) 12.5 MG tablet, Take 12.5 mg by mouth 2 (two) times daily with a meal. , Disp: , Rfl:  .  divalproex (DEPAKOTE ER) 250 MG 24 hr tablet, Take 1 tablet (250 mg total) by mouth 2 (two) times daily., Disp: 30 tablet, Rfl: 0 .  DULoxetine (CYMBALTA) 60 MG capsule, Take 1 capsule (60 mg  total) by mouth daily. (Patient taking differently: Take 60 mg by mouth 2 (two) times daily. ), Disp: 30 capsule, Rfl: 3 .  fluticasone (FLONASE) 50 MCG/ACT nasal spray, Place 2 sprays into both nostrils as needed for allergies. , Disp: , Rfl:  .  formoterol (PERFOROMIST) 20 MCG/2ML nebulizer solution, Inhale 20 mcg into the lungs 2 (two) times daily. , Disp: , Rfl:  .  gabapentin (NEURONTIN) 600 MG tablet, Take 1 tablet (600 mg total) 2 (two) times daily by mouth., Disp: , Rfl:  .  Hypromellose (ARTIFICIAL TEARS OP), Place 1 drop into both eyes as needed (dry eyes). , Disp: , Rfl:  .  insulin regular (NOVOLIN R,HUMULIN R) 100 units/mL injection, Inject 28 Units 3 (three) times daily before meals into the skin. Based on sliding scale , Disp: , Rfl:  .  levothyroxine (SYNTHROID, LEVOTHROID) 112 MCG tablet, Take 1 tablet (112 mcg total) by mouth every other day. (alternate with the 100 mcg strength) (Patient taking differently: Take 112 mcg by mouth See admin instructions. Take 1 tablet (112 mcg) by mouth before breakfast on Friday, Saturday, Sunday (take 100 mcg other days)), Disp: 15 tablet, Rfl: 2 .  meclizine (ANTIVERT) 25 MG tablet, Take 0.5-1 tablets (12.5-25 mg total) by mouth 3 (three) times daily as needed for dizziness., Disp: 30 tablet, Rfl: 0 .  memantine (NAMENDA) 10 MG tablet, Take 10 mg by mouth 2 (two) times daily., Disp: , Rfl:  .  montelukast (SINGULAIR) 10 MG tablet, TAKE ONE TABLET BY MOUTH ONCE DAILY (Patient taking differently: TAKE ONE TABLET (10 MG) BY MOUTH ONCE DAILY), Disp: 90 tablet, Rfl: 3 .  nitroGLYCERIN (NITROSTAT) 0.4 MG SL tablet, Place 1 tablet (0.4 mg total)  every 5 (five) minutes as needed under the tongue for chest pain. Max of 3 pills total, call 911, Disp: 25 tablet, Rfl: 5 .  Omega-3 Fatty Acids (FISH OIL) 1200 MG CAPS, Take 1 capsule by mouth 2 (two) times a week. , Disp: , Rfl:  .  OXYGEN, Inhale 2 L into the lungs continuous., Disp: , Rfl:  .  pantoprazole (PROTONIX) 20 MG tablet, TAKE ONE TABLET BY MOUTH TWICE DAILY (Patient taking differently: TAKE ONE TABLET (20 MG)  BY MOUTH TWICE DAILY), Disp: 180 tablet, Rfl: 1 .  ranitidine (ZANTAC) 300 MG tablet, Take 1 tablet (300 mg total) by mouth at bedtime., Disp: 30 tablet, Rfl: 11 .  rOPINIRole (REQUIP) 1 MG tablet, TAKE 1 TABLET BY MOUTH THREE TIMES DAILY (Patient taking differently: TAKE 1 TABLET (1 MG) BY MOUTH THREE TIMES DAILY), Disp: 270 tablet, Rfl: 3 .  spironolactone (ALDACTONE) 25 MG tablet, Take 25 mg by mouth daily., Disp: , Rfl:  .  metoCLOPramide (REGLAN) 5 MG tablet, Take 1 tablet (5 mg total) by mouth 3 (three) times daily before meals for 3 days., Disp: 9 tablet, Rfl: 0  Allergies  Allergen Reactions  . Vasotec [Enalapril] Swelling    Throat swells  . Risperidone Rash  . Codeine Nausea And Vomiting  . Morphine And Related Nausea And Vomiting    ROS Constitutional: Negative for fever or weight change.  Respiratory: See HPI  Cardiovascular: Negative for chest pain or palpitations.  Gastrointestinal: Negative for abdominal pain, no bowel changes.  Musculoskeletal: Negative for gait problem or joint swelling.  Skin: Negative for rash.  Neurological: Negative for dizziness or headache.  No other specific complaints in a complete review of systems (except as listed in HPI above).  Objective  Vitals:  02/26/17 0850  BP: 130/76  Pulse: 80  Resp: 18  Temp: 98.1 F (36.7 C)  TempSrc: Oral  SpO2: 93%  Weight: 196 lb (88.9 kg)  Height: 4\' 11"  (1.499 m)   Body mass index is 39.59 kg/m.  Physical Exam Constitutional: Patient appears well-developed and  well-nourished. No distress.  HENT: Head: Normocephalic and atraumatic. Ears: B TMs ok, no erythema or effusion; Nose: Nose normal. Mouth/Throat: Oropharynx is clear and moist. No oropharyngeal exudate.  Eyes: Conjunctivae and EOM are normal. Pupils are equal, round, and reactive to light. No scleral icterus.  Neck: Normal range of motion. Neck supple. No JVD present. Cardiovascular: Normal rate, regular rhythm and normal heart sounds.  No murmur heard. No BLE edema. Pulmonary/Chest: Effort normal and breath sounds notably diminished throughout. No respiratory distress. Abdominal: Soft. Bowel sounds are normal, no distension. There is no tenderness. no masses Musculoskeletal: Normal range of motion, no joint effusions. No gross deformities Neurological: Patient is somewhat somnolent in office today, falling asleep between discussions, but is responsive to and answers questions appropriately when spoken to. Skin: Skin is warm and dry. No rash noted. No erythema.  Psychiatric: Baseline mood and affect.   Results for orders placed or performed in visit on 02/26/17 (from the past 72 hour(s))  POCT glucose (manual entry)     Status: Abnormal   Collection Time: 02/26/17 11:05 AM  Result Value Ref Range   POC Glucose 133 (A) 70 - 99 mg/dl    PHQ2/9: Depression screen [facility] 2/9 02/04/2017 01/23/2017 12/09/2016 11/21/2016 10/10/2016  Decreased Interest 0 0 0 0 0  Down, Depressed, Hopeless 2 2 0 0 1  PHQ - 2 Score 2 2 0 0 1  Altered sleeping 2 2 - - -  Tired, decreased energy 0 0 - - -  Change in appetite 2 2 - - -  Feeling bad or failure about yourself  1 1 - - -  Trouble concentrating 1 1 - - -  Moving slowly or fidgety/restless 0 0 - - -  Suicidal thoughts 0 0 - - -  PHQ-9 Score 8 8 - - -  Difficult doing work/chores Very difficult Very difficult - - -   Fall Risk: Fall Risk  02/04/2017 01/23/2017 01/13/2017 12/09/2016 11/21/2016  Falls in the past year? Yes No Yes No Yes  Number falls in past  yr: 2 or more - 2 or more - 2 or more  Injury with Fall? Yes - No - No  Risk Factor Category  High Fall Risk - - - -  Risk for fall due to : History of fall(s);Impaired mobility - - - -  Follow up Falls evaluation completed - - - -    Assessment & Plan  1. Hospital discharge follow-up - Comprehensive metabolic panel - CBC w/Diff/Platelet  2. Chronic respiratory failure with hypoxia (HCC) - Note sent to Dr. Raul Del regarding orders and patient's upcoming appointment with him this afternoon. - Comprehensive metabolic panel - CBC w/Diff/Platelet - DG Chest 2 View; Future  3. COPD, moderate ([place]) - Comprehensive metabolic panel - CBC w/Diff/Platelet - DG Chest 2 View; Future - Continue current regimen, keep appointment this afternoon with Pulmonology - Dr. Raul Del at 3:15pm.  4. Adult hypothyroidism - We will recheck in 3 weeks; continue current regimen  5. Type 2 diabetes mellitus with diabetic neuropathy, with long-term current use of insulin (HCC) - POCT glucose (manual entry) - 133 in office today; advised to monitor closely, discussed what to do  in hypoglycemic event. - Comprehensive metabolic panel - Next Appointment with Dr. Honor Junes is made for April 16, 2017, however pt's daughter is instructed to call his office every few days to ask about cancellations.  6. Metabolic encephalopathy - Comprehensive metabolic panel - CBC w/Diff/Platelet - Ammonia  7. Mild dementia - Continue medication regimen  8. Depression, major, recurrent, severe with psychosis ([place]) - Continue medication regimen, keep psychiatric follow up  9. Pneumonia of both lungs due to infectious organism, unspecified part of lung - CBC w/Diff/Platelet - DG Chest 2 View; Future - Note sent to Dr. Raul Del regarding orders and patient's upcoming appointment with him this afternoon. - Discussed option to switch Abx from Levaquin, however it is mutually agreed with patient and her daughter that we will  await lab results prior to determining next course of action.  -Red flags and when to present for emergency care or RTC including fever >101.46F, worsening fatigue, chest pain, worsening shortness of breath, worsening confusion, new/worsening/un-resolving symptoms, reviewed with patient at time of visit. Follow up and care instructions discussed and provided in AVS.

## 2017-02-26 NOTE — Patient Instructions (Addendum)
Community-Acquired Pneumonia, Adult Pneumonia is an infection of the lungs. One type of pneumonia can happen while a person is in a hospital. A different type can happen when a person is not in a hospital (community-acquired pneumonia). It is easy for this kind to spread from person to person. It can spread to you if you breathe near an infected person who coughs or sneezes. Some symptoms include:  A dry cough.  A wet (productive) cough.  Fever.  Sweating.  Chest pain.  Follow these instructions at home:  Take over-the-counter and prescription medicines only as told by your doctor. ? Only take cough medicine if you are losing sleep. ? If you were prescribed an antibiotic medicine, take it as told by your doctor. Do not stop taking the antibiotic even if you start to feel better.  Sleep with your head and neck raised (elevated). You can do this by putting a few pillows under your head, or you can sleep in a recliner.  Do not use tobacco products. These include cigarettes, chewing tobacco, and e-cigarettes. If you need help quitting, ask your doctor.  Drink enough water to keep your pee (urine) clear or pale yellow. A shot (vaccine) can help prevent pneumonia. Shots are often suggested for:  People older than 75 years of age.  People older than 75 years of age: ? Who are having cancer treatment. ? Who have long-term (chronic) lung disease. ? Who have problems with their body's defense system (immune system).  You may also prevent pneumonia if you take these actions:  Get the flu (influenza) shot every year.  Go to the dentist as often as told.  Wash your hands often. If soap and water are not available, use hand sanitizer.  Contact a doctor if:  You have a fever.  You lose sleep because your cough medicine does not help. Get help right away if:  You are short of breath and it gets worse.  You have more chest pain.  Your sickness gets worse. This is very serious  if: ? You are an older adult. ? Your body's defense system is weak.  You cough up blood. This information is not intended to replace advice given to you by your health care provider. Make sure you discuss any questions you have with your health care provider. Document Released: 06/11/2007 Document Revised: 05/31/2015 Document Reviewed: 04/19/2014 Elsevier Interactive Patient Education  2018 [place].  Cough, Adult A cough helps to clear your throat and lungs. A cough may last only 2-3 weeks (acute), or it may last longer than 8 weeks (chronic). Many different things can cause a cough. A cough may be a sign of an illness or another medical condition. Follow these instructions at home:  Pay attention to any changes in your cough.  Take medicines only as told by your doctor. ? If you were prescribed an antibiotic medicine, take it as told by your doctor. Do not stop taking it even if you start to feel better. ? Talk with your doctor before you try using a cough medicine.  Drink enough fluid to keep your pee (urine) clear or pale yellow.  If the air is dry, use a cold steam vaporizer or humidifier in your home.  Stay away from things that make you cough at work or at home.  If your cough is worse at night, try using extra pillows to raise your head up higher while you sleep.  Do not smoke, and try not to be around  smoke. If you need help quitting, ask your doctor.  Do not have caffeine.  Do not drink alcohol.  Rest as needed. Contact a doctor if:  You have new problems (symptoms).  You cough up yellow fluid (pus).  Your cough does not get better after 2-3 weeks, or your cough gets worse.  Medicine does not help your cough and you are not sleeping well.  You have pain that gets worse or pain that is not helped with medicine.  You have a fever.  You are losing weight and you do not know why.  You have night sweats. Get help right away if:  You cough up  blood.  You have trouble breathing.  Your heartbeat is very fast. This information is not intended to replace advice given to you by your health care provider. Make sure you discuss any questions you have with your health care provider. Document Released: 09/05/2010 Document Revised: 05/31/2015 Document Reviewed: 03/01/2014 Elsevier Interactive Patient Education  [employer].

## 2017-02-26 NOTE — Patient Outreach (Signed)
[facility]) Care Management  02/26/2017  Bonnie Henry 18-Aug-1940 [id number]  Assessment- CSW received return email from Bonnie Henry with admissions at [facility] and [place]. Bonnie Henry reports that she does not see why she would not be able to accept patient at facility for LTC. CSW completed call to patient's daughter and she answered. CSW provided update. Daughter  contacted Bonnie Henry and set up an assessment appointment for today at 3:15. CSW received another email from [place] from [facility] stating that they will need "specific orders such as MARS and all other things that we will need to take care of her properly such as diet order, therapy orders and any other orders." CSW completed call to PCP office and left a message with front desk that these orders are needed in order to secure placement for patient. CSW has been working with patient for several months now and this is the first facility willing to accept patient for LTC placement. Bonnie Henry is also currently reviewing paperwork to see if patient can be placed at facility there. CSW sent reply email back to Bonnie Henry with Bonnie Henry stating that she sent request to PCP office for orders needed.  Plan-CSW will continue to assist family with LTC placement.  Bonnie Henry, BSW, MSW, [email or web address] Phone: [phone] Fax: [phone]

## 2017-02-26 NOTE — Telephone Encounter (Signed)
Copied from [place]. Topic: General - Other >> Feb 26, 2017 12:10 PM Cecelia Byars, NT wrote: Reason for CRM: Bonnie Henry the patients clinical social worker called please call her at  [phone] with any  questions, and said that [facility] [phone] fax or call [phone]  are considering  the patient for placement,  and they need MARS ,a diet order ,therapy orders and any other orders needed for her care this is from [facility], they are also considering her and this is her first choice  please fax [phone],  or call them at [phone]

## 2017-02-26 NOTE — Telephone Encounter (Signed)
Orders wrote up for Dr Sanda Klein to sign and will be faxed to [facility] per pt daughter.

## 2017-02-26 NOTE — Telephone Encounter (Signed)
Okay for [facility] Diet: diabetic diet, no added sugar, no added salt PT and OT evaluation only first

## 2017-02-27 ENCOUNTER — Other Ambulatory Visit: Payer: Self-pay | Admitting: Licensed Clinical Social Worker

## 2017-02-27 ENCOUNTER — Other Ambulatory Visit: Payer: Self-pay | Admitting: Specialist

## 2017-02-27 DIAGNOSIS — R918 Other nonspecific abnormal finding of lung field: Secondary | ICD-10-CM

## 2017-02-27 NOTE — Patient Outreach (Signed)
[facility]) Care Management  02/27/2017  Bonnie Henry 1940/12/19 [id number]  Assessment- CSW sent text message to patient's daughter (daughter's preferred way of communication) on 02/27/17 to follow up on patient's LTC placement status. Daughter replied back stating that patient is at [facility]. Daughter states that she will be going back to facility today to go over paperwork. She reports that she spoke with patient's Medicaid caseworker this morning and that [facility] will need to complete a new FL2 and PASSR in order to complete patient's LTC Medicaid application. CSW informed daughter that PCP faxed over orders to [place]. CSW questioned daughter if there is anything else this CSW can do for family to assist with placement and daughter stated "Right now I can't think of anything else." CSW will continue to monitor this closely.   Plan-CSW will continue to assist with LTC placement and wait for updates from daughter.  [name], BSW, MSW, [email or web address] Phone: [phone] Fax: [phone]

## 2017-03-02 ENCOUNTER — Other Ambulatory Visit: Payer: Self-pay | Admitting: Licensed Clinical Social Worker

## 2017-03-02 NOTE — Patient Outreach (Addendum)
[facility]) Care Management  03/02/2017  Bonnie Henry 1940-03-09 [id number]  Assessment- CSW received incoming text message from patient's daughter (her preferred way of communication) on 03/02/17. Patient has successfully been placed at [facility] under LTC Medicaid and has been there since 02/27/17. Daughter reports that she has updated patient's Medicaid caseworker. Family deny any further social work needs now that patient has been successfully placed at a facility. Family appreciative of social work assistance and support during this entire process. It has been a pleasure working with family. Family agreeable to case closure at this time.   [facility] CM Care Plan Problem One     Most Recent Value  Care Plan Problem One  Need for LTC placement due to recent falls and limited support within the home  Role Documenting the Problem One  Clinical Social Worker  Care Plan for Problem One  Active  [facility] Long Term Goal   Patient will be placed at a LTC facility within 90 days due to frequent falls and lack of support within the home  [facility] Long Term Goal Start Date  02/04/17  [facility] Long Term Goal Met Date  03/02/17  Interventions for Problem One Long Term Goal  GOAL MET. Patient is not at [facility].  [facility] CM Short Term Goal #1   Family will reapply for Medicaid services within 30 days for LTC placement as evidenced by family's self report  [facility] CM Short Term Goal #1 Start Date  02/04/17  [facility] CM Short Term Goal #1 Met Date  03/02/17  Interventions for Short Term Goal #1  GOAL MET.  [facility] CM Short Term Goal #2   Patient will attend all scheduled medical and mental health appointments over the next 30 days as evidenced by daughter and patient's self report  [facility] CM Short Term Goal #2 Start Date  02/04/17  [facility] CM Short Term Goal #2 Met Date  03/02/17  Interventions for Short Term Goal #2  GOAL MET.     Plan-CSW will complete case closure at this time. CSW will update [facility]  Care Management Assistant and PCP of case closure.  [name], BSW, MSW, [email or web address] Phone: [phone] Fax: [phone]

## 2017-03-04 DIAGNOSIS — F333 Major depressive disorder, recurrent, severe with psychotic symptoms: Secondary | ICD-10-CM | POA: Diagnosis not present

## 2017-03-04 DIAGNOSIS — F320 Major depressive disorder, single episode, mild: Secondary | ICD-10-CM | POA: Diagnosis not present

## 2017-03-04 DIAGNOSIS — I5189 Other ill-defined heart diseases: Secondary | ICD-10-CM | POA: Diagnosis not present

## 2017-03-04 DIAGNOSIS — G6289 Other specified polyneuropathies: Secondary | ICD-10-CM | POA: Diagnosis not present

## 2017-03-04 DIAGNOSIS — R7309 Other abnormal glucose: Secondary | ICD-10-CM | POA: Diagnosis not present

## 2017-03-06 DIAGNOSIS — R7309 Other abnormal glucose: Secondary | ICD-10-CM | POA: Diagnosis not present

## 2017-03-06 DIAGNOSIS — I5189 Other ill-defined heart diseases: Secondary | ICD-10-CM | POA: Diagnosis not present

## 2017-03-06 DIAGNOSIS — G6289 Other specified polyneuropathies: Secondary | ICD-10-CM | POA: Diagnosis not present

## 2017-03-06 DIAGNOSIS — I10 Essential (primary) hypertension: Secondary | ICD-10-CM | POA: Diagnosis not present

## 2017-03-10 ENCOUNTER — Ambulatory Visit (INDEPENDENT_AMBULATORY_CARE_PROVIDER_SITE_OTHER): Payer: Medicare Other | Admitting: Family Medicine

## 2017-03-10 ENCOUNTER — Encounter: Payer: Self-pay | Admitting: Family Medicine

## 2017-03-10 VITALS — BP 122/74 | HR 63 | Temp 98.2°F | Resp 14 | Wt 189.1 lb

## 2017-03-10 DIAGNOSIS — E039 Hypothyroidism, unspecified: Secondary | ICD-10-CM | POA: Diagnosis not present

## 2017-03-10 DIAGNOSIS — J9811 Atelectasis: Secondary | ICD-10-CM | POA: Diagnosis not present

## 2017-03-10 DIAGNOSIS — R634 Abnormal weight loss: Secondary | ICD-10-CM

## 2017-03-10 DIAGNOSIS — M1711 Unilateral primary osteoarthritis, right knee: Secondary | ICD-10-CM

## 2017-03-10 DIAGNOSIS — R748 Abnormal levels of other serum enzymes: Secondary | ICD-10-CM | POA: Diagnosis not present

## 2017-03-10 DIAGNOSIS — E1121 Type 2 diabetes mellitus with diabetic nephropathy: Secondary | ICD-10-CM

## 2017-03-10 DIAGNOSIS — R109 Unspecified abdominal pain: Secondary | ICD-10-CM

## 2017-03-10 LAB — HEPATIC FUNCTION PANEL
AG Ratio: 1.5 (calc) (ref 1.0–2.5)
ALKALINE PHOSPHATASE (APISO): 98 U/L (ref 33–130)
ALT: 17 U/L (ref 6–29)
AST: 17 U/L (ref 10–35)
Albumin: 4 g/dL (ref 3.6–5.1)
BILIRUBIN DIRECT: 0.2 mg/dL (ref 0.0–0.2)
BILIRUBIN TOTAL: 1.2 mg/dL (ref 0.2–1.2)
Globulin: 2.7 g/dL (calc) (ref 1.9–3.7)
Indirect Bilirubin: 1 mg/dL (calc) (ref 0.2–1.2)
Total Protein: 6.7 g/dL (ref 6.1–8.1)

## 2017-03-10 LAB — TSH: TSH: 0.37 m[IU]/L — AB (ref 0.40–4.50)

## 2017-03-10 NOTE — Patient Instructions (Addendum)
Talk to Dr. Ubaldo Glassing about your fluid pill REQUEST labs from [place] on referral to heme-onc from January Please have your attending at [facility] follow-up on the motorized chair for you as well as any issues going forward

## 2017-03-10 NOTE — Assessment & Plan Note (Signed)
Deep breaths 10x an hour

## 2017-03-10 NOTE — Assessment & Plan Note (Signed)
Check TSH 

## 2017-03-10 NOTE — Assessment & Plan Note (Signed)
Foot exam today; seeing endo for labs and medicine management

## 2017-03-10 NOTE — Assessment & Plan Note (Signed)
They cannot do surgery on the right knee; ortho said too much risk of surgery; they will talk to attending at the rehab facility about power chair

## 2017-03-10 NOTE — Progress Notes (Signed)
BP 122/74 (BP Location: Left Arm, Patient Position: Sitting, Cuff Size: Normal)   Pulse 63   Temp 98.2 F (36.8 C) (Oral)   Resp 14   Wt 189 lb 1.6 oz (85.8 kg)   SpO2 92%   BMI 38.19 kg/m    Subjective:    Patient ID: Bonnie Henry, female    DOB: Sep 20, 1940, 78 y.o.   MRN: [id number]  HPI: Bonnie Henry is a 77 y.o. female  Chief Complaint  Patient presents with  . Follow-up  . medication manangment    pt states fluid pill is working on her to fast.   . Elevated Hepatic Enzymes    HPI Patient is here for f/u; here with her daughter; she is currently at [facility] in [place] (explained that she should be seeing the primary attending there at the facility and not see both of [place] - one doctor needs to be running the show)  Follow-up on the congestion; went through two rounds of pneumonia treatment; was on antibiotics, then rehab; fatigue, not sure if back to baseline ------------------------------------------------------------------- Chest xray 02/26/2017: EXAM: CHEST  2 VIEW  COMPARISON:  01/29/2017.  FINDINGS: Mediastinum and hilar structures normal. Low lung volumes with mild basilar atelectasis. No pleural effusion or pneumothorax. Thoracic spine degenerative change. No acute bony abnormality.  IMPRESSION: Low lung volumes with mild basilar atelectasis.   Electronically Signed   By: Marcello Moores  Register   On: 02/26/2017 11:36 ----------------------------------------------------------------------  She says her right side is bothering her; right flank; she has not tried to push there; not worse after eating; comes and goes; no N/V/D; changing up positions actually makes it feel better; nothing makes it worse Fluid pill is working on her "too fast"; somebody gave her too much fluid pills; urinating so much; if she drinks water and then stands up after dinner, she just leaks; not retaining any fluid She had elevated liver enzymes; her  psychiatrist told her that her numbers were high; we reviewed her Feb 26, 2017 labs but the only abnormal lab related to liver was protein 8.3; we don't have those results; he noticed lab was high; no changes to medicine; he said to have PCP check that; done at [facility] after her hospital labs Seeing psych at [facility] Type 2 diabetes; seeing Dr. Gabriel Carina for that; urine microalb:Cr UTD Losing some weight, just not eating as much  Depression screen [facility] 2/9 03/10/2017 02/04/2017 01/23/2017 12/09/2016 11/21/2016  Decreased Interest 0 0 0 0 0  Down, Depressed, Hopeless 0 2 2 0 0  PHQ - 2 Score 0 2 2 0 0  Altered sleeping - 2 2 - -  Tired, decreased energy - 0 0 - -  Change in appetite - 2 2 - -  Feeling bad or failure about yourself  - 1 1 - -  Trouble concentrating - 1 1 - -  Moving slowly or fidgety/restless - 0 0 - -  Suicidal thoughts - 0 0 - -  PHQ-9 Score - 8 8 - -  Difficult doing work/chores - Very difficult Very difficult - -    Relevant past medical, surgical, family and social history reviewed Past Medical History:  Diagnosis Date  . Anemia   . Anxiety   . Aortic atherosclerosis ([place]) 12/09/2016   Chest CT Dec 2018  . Arthritis    "joints ache all over" . osteoarthritis  . Asthma   . Atrial fibrillation ([place])   . Breast cancer, left breast ([place])  S/P mastectomy  . CAD (coronary artery disease)   . CHF (congestive heart failure) ([place])   . Chronic back pain   . Constipation 03/04/2015  . COPD (chronic obstructive pulmonary disease) ([place])   . Depression, major   . Diabetes mellitus without complication ([place])   . DVT (deep venous thrombosis) ([place])   . GERD (gastroesophageal reflux disease)   . Headache   . Heart murmur   . Hepatitis    HEP "C"  . History of blood transfusion    "when I had my mastectomy"  . History of hiatal hernia   . History of right-sided carotid endarterectomy 06/10/2015  . Hypercholesteremia   . Hypertension   . Morbid obesity ([place])   . Myocardial  infarction ([place])   . Neuropathy   . Peripheral vascular disease ([place])   . Post-surgical hypothyroidism   . Restless leg syndrome   . Shortness of breath dyspnea   . Sleep apnea    "2015 wore mask; lost weight; told didn't need mask anymore" (02/23/2014)  . Type II diabetes mellitus ([place])    Past Surgical History:  Procedure Laterality Date  . ARTERY BIOPSY Right 09/29/2014   Procedure: BIOPSY TEMPORAL ARTERY;  Surgeon: Angelia Mould, MD;  Location: [facility];  Service: Vascular;  Laterality: Right;  . BARTHOLIN CYST MARSUPIALIZATION    . BREAST BIOPSY Left    Mastectomy  . CARDIAC CATHETERIZATION  2000's  . CAROTID ENDARTERECTOMY Left 2009  . CATARACT EXTRACTION W/ INTRAOCULAR LENS  IMPLANT, BILATERAL Bilateral ~ 2011  . CORONARY ANGIOPLASTY WITH STENT PLACEMENT  1990's X 2   "2; 1"  . ENDARTERECTOMY Right 05/16/2015   Procedure: ENDARTERECTOMY CAROTID;  Surgeon: Katha Cabal, MD;  Location: [facility];  Service: Vascular;  Laterality: Right;  . ESOPHAGOGASTRODUODENOSCOPY (EGD) WITH PROPOFOL N/A 12/12/2014   Procedure: ESOPHAGOGASTRODUODENOSCOPY (EGD) WITH PROPOFOL;  Surgeon: Lucilla Lame, MD;  Location: [facility] ENDOSCOPY;  Service: Endoscopy;  Laterality: N/A;  . EYE SURGERY Bilateral    Catract Extraction with IOL  . HAMMER TOE SURGERY Left    2nd and 3rd digits  . JOINT REPLACEMENT Bilateral    Total Hip Replacement  . JOINT REPLACEMENT Left    Total Knee Replacement  . LEFT HEART CATHETERIZATION WITH CORONARY ANGIOGRAM N/A 02/24/2014   Procedure: LEFT HEART CATHETERIZATION WITH CORONARY ANGIOGRAM;  Surgeon: Leonie Man, MD;  Location: [facility] CATH LAB;  Service: Cardiovascular;  Laterality: N/A;  . MASTECTOMY Left 1990's  . SHOULDER SURGERY Right    "cleaned it out; no scope"  . THYROIDECTOMY, PARTIAL  1950's  . TOE SURGERY Right    "cut piece of bone out so toe didn't dig into my foot"  . TONSILLECTOMY  1950's  . TOTAL HIP ARTHROPLASTY Bilateral 1990's  . TOTAL KNEE  ARTHROPLASTY Left 1990's  . TRIGGER FINGER RELEASE Left   . TUBAL LIGATION    . VAGINAL HYSTERECTOMY     "partial"   Family History  Problem Relation Age of Onset  . Arthritis Mother   . Cancer Mother   . Diabetes Mother   . Heart disease Mother   . Hyperlipidemia Mother   . Hypertension Mother   . Alcohol abuse Father   . Brain cancer Father   . Arthritis Maternal Grandmother   . Alzheimer's disease Maternal Grandmother   . Diabetes Daughter   . Hyperlipidemia Daughter   . Hypertension Daughter   . Thyroid cancer Daughter   . Diabetes Son   . Diabetes Daughter   .  Hypertension Daughter   . Diabetes Son   . Hypertension Son   . Hyperlipidemia Son   . Thyroid cancer Son   . Diabetes Brother   . Diabetes Brother    Social History   Tobacco Use  . Smoking status: Former Smoker    Packs/day: 1.50    Years: 44.00    Pack years: 66.00    Types: Cigarettes    Last attempt to quit: 01/06/1994    Years since quitting: 23.2  . Smokeless tobacco: Never Used  . Tobacco comment: "quit smoking cigarettes in ~ 2000"  Substance Use Topics  . Alcohol use: No    Alcohol/week: 0.0 oz    Comment: 02/23/2014 "no alcohol since 1990's"  . Drug use: No    Interim medical history since last visit reviewed. Allergies and medications reviewed  Review of Systems Per HPI unless specifically indicated above     Objective:    BP 122/74 (BP Location: Left Arm, Patient Position: Sitting, Cuff Size: Normal)   Pulse 63   Temp 98.2 F (36.8 C) (Oral)   Resp 14   Wt 189 lb 1.6 oz (85.8 kg)   SpO2 92%   BMI 38.19 kg/m   Wt Readings from Last 3 Encounters:  03/10/17 189 lb 1.6 oz (85.8 kg)  02/26/17 196 lb (88.9 kg)  02/03/17 200 lb (90.7 kg)    Physical Exam  Constitutional: She appears well-developed and well-nourished. No distress.  Elderly female; appears deconditioned, seated in wheelchair  HENT:  Head: Normocephalic and atraumatic.  Eyes: EOM are normal. No scleral icterus.   Neck: No thyromegaly present.  Cardiovascular: Normal rate, regular rhythm and normal heart sounds.  No murmur heard. Pulmonary/Chest: Effort normal and breath sounds normal. No respiratory distress. She has no wheezes.  Abdominal: Soft. Bowel sounds are normal. She exhibits no distension.  Musculoskeletal: Normal range of motion. She exhibits no edema.  Neurological: She is alert. She exhibits normal muscle tone.  Skin: Skin is warm and dry. She is not diaphoretic. No pallor.  Psychiatric: She has a normal mood and affect.   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)    Foot exam today; seeing endo for labs and medicine management      Relevant Medications   insulin NPH Human (HUMULIN N,NOVOLIN N) 100 UNIT/ML injection   Adult hypothyroidism    Check TSH      Relevant Orders   TSH (Completed)     Musculoskeletal and Integument   Osteoarthritis of right knee    They cannot do surgery on the right knee; ortho said too much risk of surgery; they will talk to attending at the rehab facility about power chair       Other Visit Diagnoses    Acute right flank pain       check labs today, as I do not have the recent abnormal labs for comparison   Weight loss       check thyroid function, f/u with attending at  snf or endo   Elevated liver enzymes       I don't have any previous abnormal LFTs recently but she says they were high (somewhere) so will recheck   Relevant Orders   Hepatic function panel (Completed)       Follow up plan: No Follow-up on file.  An after-visit summary was printed and given to the  patient at [facility].  Please see the patient instructions which may contain other information and recommendations beyond what is mentioned above in the assessment and plan.  No orders of the defined types were placed in this encounter.   Orders Placed This Encounter  Procedures  . TSH  . Hepatic function panel

## 2017-03-16 DIAGNOSIS — R7309 Other abnormal glucose: Secondary | ICD-10-CM | POA: Diagnosis not present

## 2017-03-16 DIAGNOSIS — I480 Paroxysmal atrial fibrillation: Secondary | ICD-10-CM | POA: Diagnosis not present

## 2017-03-16 DIAGNOSIS — I10 Essential (primary) hypertension: Secondary | ICD-10-CM | POA: Diagnosis not present

## 2017-03-16 DIAGNOSIS — I5189 Other ill-defined heart diseases: Secondary | ICD-10-CM | POA: Diagnosis not present

## 2017-03-17 DIAGNOSIS — M79671 Pain in right foot: Secondary | ICD-10-CM | POA: Diagnosis not present

## 2017-03-17 DIAGNOSIS — M79672 Pain in left foot: Secondary | ICD-10-CM | POA: Diagnosis not present

## 2017-03-17 DIAGNOSIS — M21611 Bunion of right foot: Secondary | ICD-10-CM | POA: Diagnosis not present

## 2017-03-17 DIAGNOSIS — M21612 Bunion of left foot: Secondary | ICD-10-CM | POA: Diagnosis not present

## 2017-03-20 ENCOUNTER — Telehealth: Payer: Self-pay

## 2017-03-20 NOTE — Telephone Encounter (Signed)
Copied from [place]. Topic: Inquiry >> Mar 20, 2017 12:04 PM Scherrie Gerlach wrote: Reason for CRM: Gae Gallop, LPN with [facility] is calling back because they have not heard anything about the tsh lab results, and the daughter had advised [facility] someone would contact them to adjust pt's med  Fax to :[place] who is the pt's nurse, she requests that we fax pt's most recent lab results. Will fax to the number above.

## 2017-03-24 ENCOUNTER — Telehealth: Payer: Self-pay

## 2017-03-24 NOTE — Telephone Encounter (Signed)
Copied from [place]. Topic: Inquiry >> Mar 20, 2017 12:04 PM Scherrie Gerlach wrote: Reason for CRM: Gae Gallop, LPN with [facility] is calling back because they have not heard anything about the tsh lab results, and the daughter had advised [facility] someone would contact them to adjust pt's med  Fax to :[phone] >> Mar 24, 2017 12:27 PM Neva Seat wrote: [facility] Is checking on the [facility] Labs she requested to be faxed on Fri. 3-15 FAX # [phone]   Labs were faxed on 03/20/17 as documented. Will fax again.

## 2017-03-25 DIAGNOSIS — I480 Paroxysmal atrial fibrillation: Secondary | ICD-10-CM | POA: Diagnosis not present

## 2017-03-25 DIAGNOSIS — I5189 Other ill-defined heart diseases: Secondary | ICD-10-CM | POA: Diagnosis not present

## 2017-03-25 DIAGNOSIS — R5383 Other fatigue: Secondary | ICD-10-CM | POA: Diagnosis not present

## 2017-03-25 DIAGNOSIS — R7309 Other abnormal glucose: Secondary | ICD-10-CM | POA: Diagnosis not present

## 2017-03-25 NOTE — Telephone Encounter (Signed)
Daughter, Inez [place], calling, checking status, please call once sent again [phone]

## 2017-03-25 NOTE — Telephone Encounter (Signed)
Called pt's daughter, she states that [place] still has not received fax. Advised her that I have faxed 3 times but would be happy to fax again. Labs faxed to [phone].

## 2017-03-27 DIAGNOSIS — J984 Other disorders of lung: Secondary | ICD-10-CM | POA: Diagnosis not present

## 2017-03-27 DIAGNOSIS — R4189 Other symptoms and signs involving cognitive functions and awareness: Secondary | ICD-10-CM | POA: Diagnosis not present

## 2017-04-01 DIAGNOSIS — N182 Chronic kidney disease, stage 2 (mild): Secondary | ICD-10-CM | POA: Diagnosis not present

## 2017-04-01 DIAGNOSIS — F338 Other recurrent depressive disorders: Secondary | ICD-10-CM | POA: Diagnosis not present

## 2017-04-01 DIAGNOSIS — E1122 Type 2 diabetes mellitus with diabetic chronic kidney disease: Secondary | ICD-10-CM | POA: Diagnosis not present

## 2017-04-06 DIAGNOSIS — K1379 Other lesions of oral mucosa: Secondary | ICD-10-CM | POA: Diagnosis not present

## 2017-04-06 DIAGNOSIS — J984 Other disorders of lung: Secondary | ICD-10-CM | POA: Diagnosis not present

## 2017-04-08 DIAGNOSIS — E1122 Type 2 diabetes mellitus with diabetic chronic kidney disease: Secondary | ICD-10-CM | POA: Diagnosis not present

## 2017-04-08 DIAGNOSIS — I130 Hypertensive heart and chronic kidney disease with heart failure and stage 1 through stage 4 chronic kidney disease, or unspecified chronic kidney disease: Secondary | ICD-10-CM | POA: Diagnosis not present

## 2017-04-08 DIAGNOSIS — N182 Chronic kidney disease, stage 2 (mild): Secondary | ICD-10-CM | POA: Diagnosis not present

## 2017-04-08 DIAGNOSIS — I5189 Other ill-defined heart diseases: Secondary | ICD-10-CM | POA: Diagnosis not present

## 2017-04-16 DIAGNOSIS — Z794 Long term (current) use of insulin: Secondary | ICD-10-CM | POA: Diagnosis not present

## 2017-04-16 DIAGNOSIS — E1165 Type 2 diabetes mellitus with hyperglycemia: Secondary | ICD-10-CM | POA: Diagnosis not present

## 2017-04-20 DIAGNOSIS — R600 Localized edema: Secondary | ICD-10-CM | POA: Diagnosis not present

## 2017-04-20 DIAGNOSIS — H01001 Unspecified blepharitis right upper eyelid: Secondary | ICD-10-CM | POA: Diagnosis not present

## 2017-04-20 DIAGNOSIS — Z7189 Other specified counseling: Secondary | ICD-10-CM | POA: Diagnosis not present

## 2017-04-21 DIAGNOSIS — M3501 Sicca syndrome with keratoconjunctivitis: Secondary | ICD-10-CM | POA: Diagnosis not present

## 2017-04-21 DIAGNOSIS — H04123 Dry eye syndrome of bilateral lacrimal glands: Secondary | ICD-10-CM | POA: Diagnosis not present

## 2017-04-21 DIAGNOSIS — Z961 Presence of intraocular lens: Secondary | ICD-10-CM | POA: Diagnosis not present

## 2017-04-21 DIAGNOSIS — E1165 Type 2 diabetes mellitus with hyperglycemia: Secondary | ICD-10-CM | POA: Diagnosis not present

## 2017-04-23 ENCOUNTER — Ambulatory Visit (HOSPITAL_COMMUNITY): Payer: Medicare Other | Attending: Specialist

## 2017-04-27 DIAGNOSIS — I5189 Other ill-defined heart diseases: Secondary | ICD-10-CM | POA: Diagnosis not present

## 2017-04-27 DIAGNOSIS — R600 Localized edema: Secondary | ICD-10-CM | POA: Diagnosis not present

## 2017-05-01 DIAGNOSIS — M25561 Pain in right knee: Secondary | ICD-10-CM | POA: Diagnosis not present

## 2017-05-01 DIAGNOSIS — R600 Localized edema: Secondary | ICD-10-CM | POA: Diagnosis not present

## 2017-05-04 DIAGNOSIS — M25561 Pain in right knee: Secondary | ICD-10-CM | POA: Diagnosis not present

## 2017-05-04 DIAGNOSIS — K1379 Other lesions of oral mucosa: Secondary | ICD-10-CM | POA: Diagnosis not present

## 2017-05-08 DIAGNOSIS — R918 Other nonspecific abnormal finding of lung field: Secondary | ICD-10-CM | POA: Diagnosis not present

## 2017-05-08 DIAGNOSIS — J449 Chronic obstructive pulmonary disease, unspecified: Secondary | ICD-10-CM | POA: Diagnosis not present

## 2017-05-11 DIAGNOSIS — M1711 Unilateral primary osteoarthritis, right knee: Secondary | ICD-10-CM | POA: Diagnosis not present

## 2017-05-13 DIAGNOSIS — E1122 Type 2 diabetes mellitus with diabetic chronic kidney disease: Secondary | ICD-10-CM | POA: Diagnosis not present

## 2017-05-15 ENCOUNTER — Telehealth: Payer: Self-pay | Admitting: Family Medicine

## 2017-05-15 NOTE — Telephone Encounter (Signed)
Dr. Sanda Klein please review

## 2017-05-15 NOTE — Telephone Encounter (Signed)
So I'm actually not her attending physician at the moment I have not put in any referrals Please remove my name as her attending They need to contact her primary for whatever issue is going on Thank you

## 2017-05-15 NOTE — Telephone Encounter (Signed)
I returned Sandy's call but she was gone for the day so I spoke with Hart [place] and relayed Dr. Delight Ovens message. She stated that she would tell Bonnie Henry and that if she had any questions to give me a call. I told her that would be fine and I gave her my direct line.

## 2017-05-15 NOTE — Telephone Encounter (Signed)
Copied from [place] [id number]. Topic: Referral - Status >> May 15, 2017  1:28 PM Margot Ables wrote: Reason for CRM: Lovey Newcomer contacted the patients daughter regarding referral and states that the daughter declined. She stated the patient is in a facility and cannot handle getting out and having chiropractic care.

## 2017-05-18 DIAGNOSIS — E1122 Type 2 diabetes mellitus with diabetic chronic kidney disease: Secondary | ICD-10-CM | POA: Diagnosis not present

## 2017-05-18 DIAGNOSIS — I5189 Other ill-defined heart diseases: Secondary | ICD-10-CM | POA: Diagnosis not present

## 2017-05-18 DIAGNOSIS — I130 Hypertensive heart and chronic kidney disease with heart failure and stage 1 through stage 4 chronic kidney disease, or unspecified chronic kidney disease: Secondary | ICD-10-CM | POA: Diagnosis not present

## 2017-05-18 DIAGNOSIS — I480 Paroxysmal atrial fibrillation: Secondary | ICD-10-CM | POA: Diagnosis not present

## 2017-05-19 DIAGNOSIS — M6281 Muscle weakness (generalized): Secondary | ICD-10-CM | POA: Diagnosis not present

## 2017-05-19 DIAGNOSIS — R41841 Cognitive communication deficit: Secondary | ICD-10-CM | POA: Diagnosis not present

## 2017-05-20 DIAGNOSIS — F411 Generalized anxiety disorder: Secondary | ICD-10-CM | POA: Diagnosis not present

## 2017-05-20 DIAGNOSIS — L84 Corns and callosities: Secondary | ICD-10-CM | POA: Diagnosis not present

## 2017-05-20 DIAGNOSIS — F0390 Unspecified dementia without behavioral disturbance: Secondary | ICD-10-CM | POA: Diagnosis not present

## 2017-05-20 DIAGNOSIS — E11628 Type 2 diabetes mellitus with other skin complications: Secondary | ICD-10-CM | POA: Diagnosis not present

## 2017-05-20 DIAGNOSIS — E1122 Type 2 diabetes mellitus with diabetic chronic kidney disease: Secondary | ICD-10-CM | POA: Diagnosis not present

## 2017-05-20 DIAGNOSIS — F330 Major depressive disorder, recurrent, mild: Secondary | ICD-10-CM | POA: Diagnosis not present

## 2017-05-20 DIAGNOSIS — H538 Other visual disturbances: Secondary | ICD-10-CM | POA: Diagnosis not present

## 2017-05-21 DIAGNOSIS — R41841 Cognitive communication deficit: Secondary | ICD-10-CM | POA: Diagnosis not present

## 2017-05-21 DIAGNOSIS — M6281 Muscle weakness (generalized): Secondary | ICD-10-CM | POA: Diagnosis not present

## 2017-05-22 DIAGNOSIS — F39 Unspecified mood [affective] disorder: Secondary | ICD-10-CM | POA: Diagnosis not present

## 2017-05-22 DIAGNOSIS — E0800 Diabetes mellitus due to underlying condition with hyperosmolarity without nonketotic hyperglycemic-hyperosmolar coma (NKHHC): Secondary | ICD-10-CM | POA: Diagnosis not present

## 2017-05-22 DIAGNOSIS — M6281 Muscle weakness (generalized): Secondary | ICD-10-CM | POA: Diagnosis not present

## 2017-05-22 DIAGNOSIS — R41841 Cognitive communication deficit: Secondary | ICD-10-CM | POA: Diagnosis not present

## 2017-05-22 DIAGNOSIS — F331 Major depressive disorder, recurrent, moderate: Secondary | ICD-10-CM | POA: Diagnosis not present

## 2017-05-22 DIAGNOSIS — F411 Generalized anxiety disorder: Secondary | ICD-10-CM | POA: Diagnosis not present

## 2017-05-22 DIAGNOSIS — G4700 Insomnia, unspecified: Secondary | ICD-10-CM | POA: Diagnosis not present

## 2017-05-25 DIAGNOSIS — R41841 Cognitive communication deficit: Secondary | ICD-10-CM | POA: Diagnosis not present

## 2017-05-25 DIAGNOSIS — M6281 Muscle weakness (generalized): Secondary | ICD-10-CM | POA: Diagnosis not present

## 2017-05-26 DIAGNOSIS — R41841 Cognitive communication deficit: Secondary | ICD-10-CM | POA: Diagnosis not present

## 2017-05-26 DIAGNOSIS — M6281 Muscle weakness (generalized): Secondary | ICD-10-CM | POA: Diagnosis not present

## 2017-05-26 DIAGNOSIS — M1711 Unilateral primary osteoarthritis, right knee: Secondary | ICD-10-CM | POA: Diagnosis not present

## 2017-05-27 DIAGNOSIS — R41841 Cognitive communication deficit: Secondary | ICD-10-CM | POA: Diagnosis not present

## 2017-05-27 DIAGNOSIS — E1122 Type 2 diabetes mellitus with diabetic chronic kidney disease: Secondary | ICD-10-CM | POA: Diagnosis not present

## 2017-05-27 DIAGNOSIS — M6281 Muscle weakness (generalized): Secondary | ICD-10-CM | POA: Diagnosis not present

## 2017-05-28 DIAGNOSIS — F411 Generalized anxiety disorder: Secondary | ICD-10-CM | POA: Diagnosis not present

## 2017-05-28 DIAGNOSIS — R41841 Cognitive communication deficit: Secondary | ICD-10-CM | POA: Diagnosis not present

## 2017-05-28 DIAGNOSIS — M6281 Muscle weakness (generalized): Secondary | ICD-10-CM | POA: Diagnosis not present

## 2017-05-28 DIAGNOSIS — F330 Major depressive disorder, recurrent, mild: Secondary | ICD-10-CM | POA: Diagnosis not present

## 2017-05-28 DIAGNOSIS — F0390 Unspecified dementia without behavioral disturbance: Secondary | ICD-10-CM | POA: Diagnosis not present

## 2017-05-29 DIAGNOSIS — M6281 Muscle weakness (generalized): Secondary | ICD-10-CM | POA: Diagnosis not present

## 2017-05-29 DIAGNOSIS — R41841 Cognitive communication deficit: Secondary | ICD-10-CM | POA: Diagnosis not present

## 2017-06-01 DIAGNOSIS — M6281 Muscle weakness (generalized): Secondary | ICD-10-CM | POA: Diagnosis not present

## 2017-06-01 DIAGNOSIS — R41841 Cognitive communication deficit: Secondary | ICD-10-CM | POA: Diagnosis not present

## 2017-06-02 DIAGNOSIS — M6281 Muscle weakness (generalized): Secondary | ICD-10-CM | POA: Diagnosis not present

## 2017-06-02 DIAGNOSIS — R41841 Cognitive communication deficit: Secondary | ICD-10-CM | POA: Diagnosis not present

## 2017-06-03 DIAGNOSIS — E1122 Type 2 diabetes mellitus with diabetic chronic kidney disease: Secondary | ICD-10-CM | POA: Diagnosis not present

## 2017-06-03 DIAGNOSIS — M6281 Muscle weakness (generalized): Secondary | ICD-10-CM | POA: Diagnosis not present

## 2017-06-03 DIAGNOSIS — R41841 Cognitive communication deficit: Secondary | ICD-10-CM | POA: Diagnosis not present

## 2017-06-03 DIAGNOSIS — M25561 Pain in right knee: Secondary | ICD-10-CM | POA: Diagnosis not present

## 2017-06-04 DIAGNOSIS — F411 Generalized anxiety disorder: Secondary | ICD-10-CM | POA: Diagnosis not present

## 2017-06-04 DIAGNOSIS — F330 Major depressive disorder, recurrent, mild: Secondary | ICD-10-CM | POA: Diagnosis not present

## 2017-06-04 DIAGNOSIS — M6281 Muscle weakness (generalized): Secondary | ICD-10-CM | POA: Diagnosis not present

## 2017-06-04 DIAGNOSIS — R41841 Cognitive communication deficit: Secondary | ICD-10-CM | POA: Diagnosis not present

## 2017-06-04 DIAGNOSIS — F0390 Unspecified dementia without behavioral disturbance: Secondary | ICD-10-CM | POA: Diagnosis not present

## 2017-06-05 DIAGNOSIS — R41841 Cognitive communication deficit: Secondary | ICD-10-CM | POA: Diagnosis not present

## 2017-06-05 DIAGNOSIS — E0800 Diabetes mellitus due to underlying condition with hyperosmolarity without nonketotic hyperglycemic-hyperosmolar coma (NKHHC): Secondary | ICD-10-CM | POA: Diagnosis not present

## 2017-06-05 DIAGNOSIS — T883XXD Malignant hyperthermia due to anesthesia, subsequent encounter: Secondary | ICD-10-CM | POA: Diagnosis not present

## 2017-06-05 DIAGNOSIS — I10 Essential (primary) hypertension: Secondary | ICD-10-CM | POA: Diagnosis not present

## 2017-06-05 DIAGNOSIS — E039 Hypothyroidism, unspecified: Secondary | ICD-10-CM | POA: Diagnosis not present

## 2017-06-05 DIAGNOSIS — M6281 Muscle weakness (generalized): Secondary | ICD-10-CM | POA: Diagnosis not present

## 2017-06-05 DIAGNOSIS — E785 Hyperlipidemia, unspecified: Secondary | ICD-10-CM | POA: Diagnosis not present

## 2017-06-05 DIAGNOSIS — R4182 Altered mental status, unspecified: Secondary | ICD-10-CM | POA: Diagnosis not present

## 2017-06-08 DIAGNOSIS — M6281 Muscle weakness (generalized): Secondary | ICD-10-CM | POA: Diagnosis not present

## 2017-06-08 DIAGNOSIS — R41841 Cognitive communication deficit: Secondary | ICD-10-CM | POA: Diagnosis not present

## 2017-06-09 DIAGNOSIS — M6281 Muscle weakness (generalized): Secondary | ICD-10-CM | POA: Diagnosis not present

## 2017-06-09 DIAGNOSIS — R41841 Cognitive communication deficit: Secondary | ICD-10-CM | POA: Diagnosis not present

## 2017-06-10 DIAGNOSIS — M6281 Muscle weakness (generalized): Secondary | ICD-10-CM | POA: Diagnosis not present

## 2017-06-10 DIAGNOSIS — R41841 Cognitive communication deficit: Secondary | ICD-10-CM | POA: Diagnosis not present

## 2017-06-10 DIAGNOSIS — E1122 Type 2 diabetes mellitus with diabetic chronic kidney disease: Secondary | ICD-10-CM | POA: Diagnosis not present

## 2017-06-11 DIAGNOSIS — F330 Major depressive disorder, recurrent, mild: Secondary | ICD-10-CM | POA: Diagnosis not present

## 2017-06-11 DIAGNOSIS — F411 Generalized anxiety disorder: Secondary | ICD-10-CM | POA: Diagnosis not present

## 2017-06-11 DIAGNOSIS — R41841 Cognitive communication deficit: Secondary | ICD-10-CM | POA: Diagnosis not present

## 2017-06-11 DIAGNOSIS — F0390 Unspecified dementia without behavioral disturbance: Secondary | ICD-10-CM | POA: Diagnosis not present

## 2017-06-11 DIAGNOSIS — M6281 Muscle weakness (generalized): Secondary | ICD-10-CM | POA: Diagnosis not present

## 2017-06-12 DIAGNOSIS — R05 Cough: Secondary | ICD-10-CM | POA: Diagnosis not present

## 2017-06-12 DIAGNOSIS — M6281 Muscle weakness (generalized): Secondary | ICD-10-CM | POA: Diagnosis not present

## 2017-06-12 DIAGNOSIS — R41841 Cognitive communication deficit: Secondary | ICD-10-CM | POA: Diagnosis not present

## 2017-06-15 DIAGNOSIS — K5901 Slow transit constipation: Secondary | ICD-10-CM | POA: Diagnosis not present

## 2017-06-17 DIAGNOSIS — F338 Other recurrent depressive disorders: Secondary | ICD-10-CM | POA: Diagnosis not present

## 2017-06-17 DIAGNOSIS — E1122 Type 2 diabetes mellitus with diabetic chronic kidney disease: Secondary | ICD-10-CM | POA: Diagnosis not present

## 2017-06-17 DIAGNOSIS — I480 Paroxysmal atrial fibrillation: Secondary | ICD-10-CM | POA: Diagnosis not present

## 2017-06-17 DIAGNOSIS — I5189 Other ill-defined heart diseases: Secondary | ICD-10-CM | POA: Diagnosis not present

## 2017-06-18 DIAGNOSIS — F330 Major depressive disorder, recurrent, mild: Secondary | ICD-10-CM | POA: Diagnosis not present

## 2017-06-18 DIAGNOSIS — F0390 Unspecified dementia without behavioral disturbance: Secondary | ICD-10-CM | POA: Diagnosis not present

## 2017-06-18 DIAGNOSIS — F411 Generalized anxiety disorder: Secondary | ICD-10-CM | POA: Diagnosis not present

## 2017-06-19 DIAGNOSIS — M6281 Muscle weakness (generalized): Secondary | ICD-10-CM | POA: Diagnosis not present

## 2017-06-19 DIAGNOSIS — E119 Type 2 diabetes mellitus without complications: Secondary | ICD-10-CM | POA: Diagnosis not present

## 2017-06-19 DIAGNOSIS — H04123 Dry eye syndrome of bilateral lacrimal glands: Secondary | ICD-10-CM | POA: Diagnosis not present

## 2017-06-19 DIAGNOSIS — R41841 Cognitive communication deficit: Secondary | ICD-10-CM | POA: Diagnosis not present

## 2017-06-19 DIAGNOSIS — Z961 Presence of intraocular lens: Secondary | ICD-10-CM | POA: Diagnosis not present

## 2017-06-19 DIAGNOSIS — H0520 Unspecified exophthalmos: Secondary | ICD-10-CM | POA: Diagnosis not present

## 2017-06-22 DIAGNOSIS — M6281 Muscle weakness (generalized): Secondary | ICD-10-CM | POA: Diagnosis not present

## 2017-06-22 DIAGNOSIS — F0390 Unspecified dementia without behavioral disturbance: Secondary | ICD-10-CM | POA: Diagnosis not present

## 2017-06-22 DIAGNOSIS — R41841 Cognitive communication deficit: Secondary | ICD-10-CM | POA: Diagnosis not present

## 2017-06-22 DIAGNOSIS — F411 Generalized anxiety disorder: Secondary | ICD-10-CM | POA: Diagnosis not present

## 2017-06-22 DIAGNOSIS — F330 Major depressive disorder, recurrent, mild: Secondary | ICD-10-CM | POA: Diagnosis not present

## 2017-06-23 DIAGNOSIS — M6281 Muscle weakness (generalized): Secondary | ICD-10-CM | POA: Diagnosis not present

## 2017-06-23 DIAGNOSIS — R41841 Cognitive communication deficit: Secondary | ICD-10-CM | POA: Diagnosis not present

## 2017-06-24 DIAGNOSIS — W1789XA Other fall from one level to another, initial encounter: Secondary | ICD-10-CM | POA: Diagnosis not present

## 2017-06-24 DIAGNOSIS — M6281 Muscle weakness (generalized): Secondary | ICD-10-CM | POA: Diagnosis not present

## 2017-06-24 DIAGNOSIS — E1122 Type 2 diabetes mellitus with diabetic chronic kidney disease: Secondary | ICD-10-CM | POA: Diagnosis not present

## 2017-06-24 DIAGNOSIS — R41841 Cognitive communication deficit: Secondary | ICD-10-CM | POA: Diagnosis not present

## 2017-06-25 DIAGNOSIS — M6281 Muscle weakness (generalized): Secondary | ICD-10-CM | POA: Diagnosis not present

## 2017-06-25 DIAGNOSIS — R41841 Cognitive communication deficit: Secondary | ICD-10-CM | POA: Diagnosis not present

## 2017-06-26 DIAGNOSIS — M6281 Muscle weakness (generalized): Secondary | ICD-10-CM | POA: Diagnosis not present

## 2017-06-26 DIAGNOSIS — R41841 Cognitive communication deficit: Secondary | ICD-10-CM | POA: Diagnosis not present

## 2017-06-29 DIAGNOSIS — E1122 Type 2 diabetes mellitus with diabetic chronic kidney disease: Secondary | ICD-10-CM | POA: Diagnosis not present

## 2017-06-30 DIAGNOSIS — R4182 Altered mental status, unspecified: Secondary | ICD-10-CM | POA: Diagnosis not present

## 2017-06-30 DIAGNOSIS — E039 Hypothyroidism, unspecified: Secondary | ICD-10-CM | POA: Diagnosis not present

## 2017-06-30 DIAGNOSIS — T883XXD Malignant hyperthermia due to anesthesia, subsequent encounter: Secondary | ICD-10-CM | POA: Diagnosis not present

## 2017-06-30 DIAGNOSIS — I10 Essential (primary) hypertension: Secondary | ICD-10-CM | POA: Diagnosis not present

## 2017-06-30 DIAGNOSIS — E0800 Diabetes mellitus due to underlying condition with hyperosmolarity without nonketotic hyperglycemic-hyperosmolar coma (NKHHC): Secondary | ICD-10-CM | POA: Diagnosis not present

## 2017-06-30 DIAGNOSIS — R05 Cough: Secondary | ICD-10-CM | POA: Diagnosis not present

## 2017-06-30 DIAGNOSIS — E785 Hyperlipidemia, unspecified: Secondary | ICD-10-CM | POA: Diagnosis not present

## 2017-07-01 DIAGNOSIS — J449 Chronic obstructive pulmonary disease, unspecified: Secondary | ICD-10-CM | POA: Diagnosis not present

## 2017-07-01 DIAGNOSIS — D649 Anemia, unspecified: Secondary | ICD-10-CM | POA: Diagnosis not present

## 2017-07-01 DIAGNOSIS — J441 Chronic obstructive pulmonary disease with (acute) exacerbation: Secondary | ICD-10-CM | POA: Diagnosis not present

## 2017-07-01 DIAGNOSIS — I10 Essential (primary) hypertension: Secondary | ICD-10-CM | POA: Diagnosis not present

## 2017-07-01 DIAGNOSIS — E1122 Type 2 diabetes mellitus with diabetic chronic kidney disease: Secondary | ICD-10-CM | POA: Diagnosis not present

## 2017-07-02 DIAGNOSIS — M6281 Muscle weakness (generalized): Secondary | ICD-10-CM | POA: Diagnosis not present

## 2017-07-02 DIAGNOSIS — F411 Generalized anxiety disorder: Secondary | ICD-10-CM | POA: Diagnosis not present

## 2017-07-02 DIAGNOSIS — R41841 Cognitive communication deficit: Secondary | ICD-10-CM | POA: Diagnosis not present

## 2017-07-02 DIAGNOSIS — F330 Major depressive disorder, recurrent, mild: Secondary | ICD-10-CM | POA: Diagnosis not present

## 2017-07-02 DIAGNOSIS — F0390 Unspecified dementia without behavioral disturbance: Secondary | ICD-10-CM | POA: Diagnosis not present

## 2017-07-03 DIAGNOSIS — R41841 Cognitive communication deficit: Secondary | ICD-10-CM | POA: Diagnosis not present

## 2017-07-03 DIAGNOSIS — J441 Chronic obstructive pulmonary disease with (acute) exacerbation: Secondary | ICD-10-CM | POA: Diagnosis not present

## 2017-07-03 DIAGNOSIS — E1122 Type 2 diabetes mellitus with diabetic chronic kidney disease: Secondary | ICD-10-CM | POA: Diagnosis not present

## 2017-07-03 DIAGNOSIS — M6281 Muscle weakness (generalized): Secondary | ICD-10-CM | POA: Diagnosis not present

## 2017-07-05 DIAGNOSIS — M6281 Muscle weakness (generalized): Secondary | ICD-10-CM | POA: Diagnosis not present

## 2017-07-05 DIAGNOSIS — R41841 Cognitive communication deficit: Secondary | ICD-10-CM | POA: Diagnosis not present

## 2017-07-06 DIAGNOSIS — R41841 Cognitive communication deficit: Secondary | ICD-10-CM | POA: Diagnosis not present

## 2017-07-06 DIAGNOSIS — M6281 Muscle weakness (generalized): Secondary | ICD-10-CM | POA: Diagnosis not present

## 2017-07-07 DIAGNOSIS — R41841 Cognitive communication deficit: Secondary | ICD-10-CM | POA: Diagnosis not present

## 2017-07-07 DIAGNOSIS — M6281 Muscle weakness (generalized): Secondary | ICD-10-CM | POA: Diagnosis not present

## 2017-07-08 DIAGNOSIS — R41841 Cognitive communication deficit: Secondary | ICD-10-CM | POA: Diagnosis not present

## 2017-07-08 DIAGNOSIS — M6281 Muscle weakness (generalized): Secondary | ICD-10-CM | POA: Diagnosis not present

## 2017-07-09 DIAGNOSIS — R41841 Cognitive communication deficit: Secondary | ICD-10-CM | POA: Diagnosis not present

## 2017-07-09 DIAGNOSIS — M6281 Muscle weakness (generalized): Secondary | ICD-10-CM | POA: Diagnosis not present

## 2017-07-09 DIAGNOSIS — F330 Major depressive disorder, recurrent, mild: Secondary | ICD-10-CM | POA: Diagnosis not present

## 2017-07-09 DIAGNOSIS — F411 Generalized anxiety disorder: Secondary | ICD-10-CM | POA: Diagnosis not present

## 2017-07-09 DIAGNOSIS — F0390 Unspecified dementia without behavioral disturbance: Secondary | ICD-10-CM | POA: Diagnosis not present

## 2017-07-13 DIAGNOSIS — R41841 Cognitive communication deficit: Secondary | ICD-10-CM | POA: Diagnosis not present

## 2017-07-13 DIAGNOSIS — E1051 Type 1 diabetes mellitus with diabetic peripheral angiopathy without gangrene: Secondary | ICD-10-CM | POA: Diagnosis not present

## 2017-07-13 DIAGNOSIS — Z794 Long term (current) use of insulin: Secondary | ICD-10-CM | POA: Diagnosis not present

## 2017-07-13 DIAGNOSIS — B351 Tinea unguium: Secondary | ICD-10-CM | POA: Diagnosis not present

## 2017-07-13 DIAGNOSIS — M6281 Muscle weakness (generalized): Secondary | ICD-10-CM | POA: Diagnosis not present

## 2017-07-14 DIAGNOSIS — R41841 Cognitive communication deficit: Secondary | ICD-10-CM | POA: Diagnosis not present

## 2017-07-14 DIAGNOSIS — M6281 Muscle weakness (generalized): Secondary | ICD-10-CM | POA: Diagnosis not present

## 2017-07-15 DIAGNOSIS — R41841 Cognitive communication deficit: Secondary | ICD-10-CM | POA: Diagnosis not present

## 2017-07-15 DIAGNOSIS — M6281 Muscle weakness (generalized): Secondary | ICD-10-CM | POA: Diagnosis not present

## 2017-07-16 DIAGNOSIS — F411 Generalized anxiety disorder: Secondary | ICD-10-CM | POA: Diagnosis not present

## 2017-07-16 DIAGNOSIS — F330 Major depressive disorder, recurrent, mild: Secondary | ICD-10-CM | POA: Diagnosis not present

## 2017-07-16 DIAGNOSIS — R41841 Cognitive communication deficit: Secondary | ICD-10-CM | POA: Diagnosis not present

## 2017-07-16 DIAGNOSIS — M6281 Muscle weakness (generalized): Secondary | ICD-10-CM | POA: Diagnosis not present

## 2017-07-16 DIAGNOSIS — F0390 Unspecified dementia without behavioral disturbance: Secondary | ICD-10-CM | POA: Diagnosis not present

## 2017-07-17 DIAGNOSIS — M6281 Muscle weakness (generalized): Secondary | ICD-10-CM | POA: Diagnosis not present

## 2017-07-17 DIAGNOSIS — G4700 Insomnia, unspecified: Secondary | ICD-10-CM | POA: Diagnosis not present

## 2017-07-17 DIAGNOSIS — F39 Unspecified mood [affective] disorder: Secondary | ICD-10-CM | POA: Diagnosis not present

## 2017-07-17 DIAGNOSIS — F411 Generalized anxiety disorder: Secondary | ICD-10-CM | POA: Diagnosis not present

## 2017-07-17 DIAGNOSIS — R41841 Cognitive communication deficit: Secondary | ICD-10-CM | POA: Diagnosis not present

## 2017-07-17 DIAGNOSIS — F331 Major depressive disorder, recurrent, moderate: Secondary | ICD-10-CM | POA: Diagnosis not present

## 2017-07-19 DIAGNOSIS — M6281 Muscle weakness (generalized): Secondary | ICD-10-CM | POA: Diagnosis not present

## 2017-07-19 DIAGNOSIS — R41841 Cognitive communication deficit: Secondary | ICD-10-CM | POA: Diagnosis not present

## 2017-07-20 DIAGNOSIS — M6281 Muscle weakness (generalized): Secondary | ICD-10-CM | POA: Diagnosis not present

## 2017-07-20 DIAGNOSIS — R41841 Cognitive communication deficit: Secondary | ICD-10-CM | POA: Diagnosis not present

## 2017-07-21 DIAGNOSIS — R41841 Cognitive communication deficit: Secondary | ICD-10-CM | POA: Diagnosis not present

## 2017-07-21 DIAGNOSIS — M6281 Muscle weakness (generalized): Secondary | ICD-10-CM | POA: Diagnosis not present

## 2017-07-22 DIAGNOSIS — M6281 Muscle weakness (generalized): Secondary | ICD-10-CM | POA: Diagnosis not present

## 2017-07-22 DIAGNOSIS — R41841 Cognitive communication deficit: Secondary | ICD-10-CM | POA: Diagnosis not present

## 2017-07-23 DIAGNOSIS — R41841 Cognitive communication deficit: Secondary | ICD-10-CM | POA: Diagnosis not present

## 2017-07-23 DIAGNOSIS — F330 Major depressive disorder, recurrent, mild: Secondary | ICD-10-CM | POA: Diagnosis not present

## 2017-07-23 DIAGNOSIS — M6281 Muscle weakness (generalized): Secondary | ICD-10-CM | POA: Diagnosis not present

## 2017-07-23 DIAGNOSIS — F411 Generalized anxiety disorder: Secondary | ICD-10-CM | POA: Diagnosis not present

## 2017-07-23 DIAGNOSIS — F0390 Unspecified dementia without behavioral disturbance: Secondary | ICD-10-CM | POA: Diagnosis not present

## 2017-07-24 DIAGNOSIS — M6281 Muscle weakness (generalized): Secondary | ICD-10-CM | POA: Diagnosis not present

## 2017-07-24 DIAGNOSIS — R41841 Cognitive communication deficit: Secondary | ICD-10-CM | POA: Diagnosis not present

## 2017-07-26 DIAGNOSIS — R41841 Cognitive communication deficit: Secondary | ICD-10-CM | POA: Diagnosis not present

## 2017-07-26 DIAGNOSIS — M6281 Muscle weakness (generalized): Secondary | ICD-10-CM | POA: Diagnosis not present

## 2017-07-27 DIAGNOSIS — M545 Low back pain: Secondary | ICD-10-CM | POA: Diagnosis not present

## 2017-07-27 DIAGNOSIS — R41841 Cognitive communication deficit: Secondary | ICD-10-CM | POA: Diagnosis not present

## 2017-07-27 DIAGNOSIS — M25562 Pain in left knee: Secondary | ICD-10-CM | POA: Diagnosis not present

## 2017-07-27 DIAGNOSIS — M25552 Pain in left hip: Secondary | ICD-10-CM | POA: Diagnosis not present

## 2017-07-27 DIAGNOSIS — M533 Sacrococcygeal disorders, not elsewhere classified: Secondary | ICD-10-CM | POA: Diagnosis not present

## 2017-07-27 DIAGNOSIS — M6281 Muscle weakness (generalized): Secondary | ICD-10-CM | POA: Diagnosis not present

## 2017-07-27 DIAGNOSIS — M79652 Pain in left thigh: Secondary | ICD-10-CM | POA: Diagnosis not present

## 2017-07-28 DIAGNOSIS — R41841 Cognitive communication deficit: Secondary | ICD-10-CM | POA: Diagnosis not present

## 2017-07-28 DIAGNOSIS — M6281 Muscle weakness (generalized): Secondary | ICD-10-CM | POA: Diagnosis not present

## 2017-07-28 DIAGNOSIS — M1711 Unilateral primary osteoarthritis, right knee: Secondary | ICD-10-CM | POA: Diagnosis not present

## 2017-07-29 DIAGNOSIS — M6281 Muscle weakness (generalized): Secondary | ICD-10-CM | POA: Diagnosis not present

## 2017-07-29 DIAGNOSIS — R41841 Cognitive communication deficit: Secondary | ICD-10-CM | POA: Diagnosis not present

## 2017-07-29 DIAGNOSIS — E1122 Type 2 diabetes mellitus with diabetic chronic kidney disease: Secondary | ICD-10-CM | POA: Diagnosis not present

## 2017-07-30 DIAGNOSIS — F411 Generalized anxiety disorder: Secondary | ICD-10-CM | POA: Diagnosis not present

## 2017-07-30 DIAGNOSIS — F0390 Unspecified dementia without behavioral disturbance: Secondary | ICD-10-CM | POA: Diagnosis not present

## 2017-07-30 DIAGNOSIS — F330 Major depressive disorder, recurrent, mild: Secondary | ICD-10-CM | POA: Diagnosis not present

## 2017-07-30 DIAGNOSIS — M6281 Muscle weakness (generalized): Secondary | ICD-10-CM | POA: Diagnosis not present

## 2017-07-30 DIAGNOSIS — R41841 Cognitive communication deficit: Secondary | ICD-10-CM | POA: Diagnosis not present

## 2017-07-31 DIAGNOSIS — M6281 Muscle weakness (generalized): Secondary | ICD-10-CM | POA: Diagnosis not present

## 2017-07-31 DIAGNOSIS — R41841 Cognitive communication deficit: Secondary | ICD-10-CM | POA: Diagnosis not present

## 2017-08-03 DIAGNOSIS — M6281 Muscle weakness (generalized): Secondary | ICD-10-CM | POA: Diagnosis not present

## 2017-08-03 DIAGNOSIS — R41841 Cognitive communication deficit: Secondary | ICD-10-CM | POA: Diagnosis not present

## 2017-08-03 DIAGNOSIS — E1122 Type 2 diabetes mellitus with diabetic chronic kidney disease: Secondary | ICD-10-CM | POA: Diagnosis not present

## 2017-08-04 DIAGNOSIS — R41841 Cognitive communication deficit: Secondary | ICD-10-CM | POA: Diagnosis not present

## 2017-08-04 DIAGNOSIS — F0390 Unspecified dementia without behavioral disturbance: Secondary | ICD-10-CM | POA: Diagnosis not present

## 2017-08-04 DIAGNOSIS — F330 Major depressive disorder, recurrent, mild: Secondary | ICD-10-CM | POA: Diagnosis not present

## 2017-08-04 DIAGNOSIS — F411 Generalized anxiety disorder: Secondary | ICD-10-CM | POA: Diagnosis not present

## 2017-08-04 DIAGNOSIS — M6281 Muscle weakness (generalized): Secondary | ICD-10-CM | POA: Diagnosis not present

## 2017-08-05 DIAGNOSIS — E1122 Type 2 diabetes mellitus with diabetic chronic kidney disease: Secondary | ICD-10-CM | POA: Diagnosis not present

## 2017-08-05 DIAGNOSIS — M25561 Pain in right knee: Secondary | ICD-10-CM | POA: Diagnosis not present

## 2017-08-06 DIAGNOSIS — M6281 Muscle weakness (generalized): Secondary | ICD-10-CM | POA: Diagnosis not present

## 2017-08-06 DIAGNOSIS — I509 Heart failure, unspecified: Secondary | ICD-10-CM | POA: Diagnosis not present

## 2017-08-07 DIAGNOSIS — E0800 Diabetes mellitus due to underlying condition with hyperosmolarity without nonketotic hyperglycemic-hyperosmolar coma (NKHHC): Secondary | ICD-10-CM | POA: Diagnosis not present

## 2017-08-07 DIAGNOSIS — I509 Heart failure, unspecified: Secondary | ICD-10-CM | POA: Diagnosis not present

## 2017-08-07 DIAGNOSIS — M6281 Muscle weakness (generalized): Secondary | ICD-10-CM | POA: Diagnosis not present

## 2017-08-08 DIAGNOSIS — I509 Heart failure, unspecified: Secondary | ICD-10-CM | POA: Diagnosis not present

## 2017-08-08 DIAGNOSIS — M6281 Muscle weakness (generalized): Secondary | ICD-10-CM | POA: Diagnosis not present

## 2017-08-10 DIAGNOSIS — I509 Heart failure, unspecified: Secondary | ICD-10-CM | POA: Diagnosis not present

## 2017-08-10 DIAGNOSIS — M6281 Muscle weakness (generalized): Secondary | ICD-10-CM | POA: Diagnosis not present

## 2017-08-11 DIAGNOSIS — I509 Heart failure, unspecified: Secondary | ICD-10-CM | POA: Diagnosis not present

## 2017-08-11 DIAGNOSIS — M6281 Muscle weakness (generalized): Secondary | ICD-10-CM | POA: Diagnosis not present

## 2017-08-12 DIAGNOSIS — I509 Heart failure, unspecified: Secondary | ICD-10-CM | POA: Diagnosis not present

## 2017-08-12 DIAGNOSIS — M6281 Muscle weakness (generalized): Secondary | ICD-10-CM | POA: Diagnosis not present

## 2017-08-13 DIAGNOSIS — I509 Heart failure, unspecified: Secondary | ICD-10-CM | POA: Diagnosis not present

## 2017-08-13 DIAGNOSIS — F330 Major depressive disorder, recurrent, mild: Secondary | ICD-10-CM | POA: Diagnosis not present

## 2017-08-13 DIAGNOSIS — M6281 Muscle weakness (generalized): Secondary | ICD-10-CM | POA: Diagnosis not present

## 2017-08-13 DIAGNOSIS — F411 Generalized anxiety disorder: Secondary | ICD-10-CM | POA: Diagnosis not present

## 2017-08-13 DIAGNOSIS — H903 Sensorineural hearing loss, bilateral: Secondary | ICD-10-CM | POA: Diagnosis not present

## 2017-08-13 DIAGNOSIS — F0390 Unspecified dementia without behavioral disturbance: Secondary | ICD-10-CM | POA: Diagnosis not present

## 2017-08-14 DIAGNOSIS — M6281 Muscle weakness (generalized): Secondary | ICD-10-CM | POA: Diagnosis not present

## 2017-08-14 DIAGNOSIS — I509 Heart failure, unspecified: Secondary | ICD-10-CM | POA: Diagnosis not present

## 2017-08-17 DIAGNOSIS — I509 Heart failure, unspecified: Secondary | ICD-10-CM | POA: Diagnosis not present

## 2017-08-17 DIAGNOSIS — M6281 Muscle weakness (generalized): Secondary | ICD-10-CM | POA: Diagnosis not present

## 2017-08-18 DIAGNOSIS — M6281 Muscle weakness (generalized): Secondary | ICD-10-CM | POA: Diagnosis not present

## 2017-08-18 DIAGNOSIS — I509 Heart failure, unspecified: Secondary | ICD-10-CM | POA: Diagnosis not present

## 2017-08-19 DIAGNOSIS — I509 Heart failure, unspecified: Secondary | ICD-10-CM | POA: Diagnosis not present

## 2017-08-19 DIAGNOSIS — M6281 Muscle weakness (generalized): Secondary | ICD-10-CM | POA: Diagnosis not present

## 2017-08-20 DIAGNOSIS — F330 Major depressive disorder, recurrent, mild: Secondary | ICD-10-CM | POA: Diagnosis not present

## 2017-08-20 DIAGNOSIS — F0390 Unspecified dementia without behavioral disturbance: Secondary | ICD-10-CM | POA: Diagnosis not present

## 2017-08-20 DIAGNOSIS — F411 Generalized anxiety disorder: Secondary | ICD-10-CM | POA: Diagnosis not present

## 2017-08-21 DIAGNOSIS — F39 Unspecified mood [affective] disorder: Secondary | ICD-10-CM | POA: Diagnosis not present

## 2017-08-21 DIAGNOSIS — I509 Heart failure, unspecified: Secondary | ICD-10-CM | POA: Diagnosis not present

## 2017-08-21 DIAGNOSIS — M6281 Muscle weakness (generalized): Secondary | ICD-10-CM | POA: Diagnosis not present

## 2017-08-21 DIAGNOSIS — I130 Hypertensive heart and chronic kidney disease with heart failure and stage 1 through stage 4 chronic kidney disease, or unspecified chronic kidney disease: Secondary | ICD-10-CM | POA: Diagnosis not present

## 2017-08-21 DIAGNOSIS — F411 Generalized anxiety disorder: Secondary | ICD-10-CM | POA: Diagnosis not present

## 2017-08-21 DIAGNOSIS — E1122 Type 2 diabetes mellitus with diabetic chronic kidney disease: Secondary | ICD-10-CM | POA: Diagnosis not present

## 2017-08-21 DIAGNOSIS — G4700 Insomnia, unspecified: Secondary | ICD-10-CM | POA: Diagnosis not present

## 2017-08-21 DIAGNOSIS — F331 Major depressive disorder, recurrent, moderate: Secondary | ICD-10-CM | POA: Diagnosis not present

## 2017-08-23 DIAGNOSIS — I509 Heart failure, unspecified: Secondary | ICD-10-CM | POA: Diagnosis not present

## 2017-08-23 DIAGNOSIS — M6281 Muscle weakness (generalized): Secondary | ICD-10-CM | POA: Diagnosis not present

## 2017-08-24 DIAGNOSIS — M6281 Muscle weakness (generalized): Secondary | ICD-10-CM | POA: Diagnosis not present

## 2017-08-24 DIAGNOSIS — I509 Heart failure, unspecified: Secondary | ICD-10-CM | POA: Diagnosis not present

## 2017-08-25 DIAGNOSIS — I509 Heart failure, unspecified: Secondary | ICD-10-CM | POA: Diagnosis not present

## 2017-08-25 DIAGNOSIS — M6281 Muscle weakness (generalized): Secondary | ICD-10-CM | POA: Diagnosis not present

## 2017-08-26 DIAGNOSIS — E1122 Type 2 diabetes mellitus with diabetic chronic kidney disease: Secondary | ICD-10-CM | POA: Diagnosis not present

## 2017-08-26 DIAGNOSIS — M6281 Muscle weakness (generalized): Secondary | ICD-10-CM | POA: Diagnosis not present

## 2017-08-26 DIAGNOSIS — I509 Heart failure, unspecified: Secondary | ICD-10-CM | POA: Diagnosis not present

## 2017-08-28 DIAGNOSIS — E1122 Type 2 diabetes mellitus with diabetic chronic kidney disease: Secondary | ICD-10-CM | POA: Diagnosis not present

## 2017-09-02 DIAGNOSIS — E1122 Type 2 diabetes mellitus with diabetic chronic kidney disease: Secondary | ICD-10-CM | POA: Diagnosis not present

## 2017-09-03 DIAGNOSIS — F330 Major depressive disorder, recurrent, mild: Secondary | ICD-10-CM | POA: Diagnosis not present

## 2017-09-03 DIAGNOSIS — F411 Generalized anxiety disorder: Secondary | ICD-10-CM | POA: Diagnosis not present

## 2017-09-03 DIAGNOSIS — F0390 Unspecified dementia without behavioral disturbance: Secondary | ICD-10-CM | POA: Diagnosis not present

## 2017-09-09 DIAGNOSIS — J410 Simple chronic bronchitis: Secondary | ICD-10-CM | POA: Diagnosis not present

## 2017-09-09 DIAGNOSIS — E1122 Type 2 diabetes mellitus with diabetic chronic kidney disease: Secondary | ICD-10-CM | POA: Diagnosis not present

## 2017-09-10 DIAGNOSIS — F330 Major depressive disorder, recurrent, mild: Secondary | ICD-10-CM | POA: Diagnosis not present

## 2017-09-10 DIAGNOSIS — F411 Generalized anxiety disorder: Secondary | ICD-10-CM | POA: Diagnosis not present

## 2017-09-10 DIAGNOSIS — F0390 Unspecified dementia without behavioral disturbance: Secondary | ICD-10-CM | POA: Diagnosis not present

## 2017-09-16 DIAGNOSIS — E1122 Type 2 diabetes mellitus with diabetic chronic kidney disease: Secondary | ICD-10-CM | POA: Diagnosis not present

## 2017-09-22 ENCOUNTER — Encounter: Payer: Self-pay | Admitting: Endocrinology

## 2017-09-23 DIAGNOSIS — R05 Cough: Secondary | ICD-10-CM | POA: Diagnosis not present

## 2017-09-23 DIAGNOSIS — R062 Wheezing: Secondary | ICD-10-CM | POA: Diagnosis not present

## 2017-11-23 DIAGNOSIS — J449 Chronic obstructive pulmonary disease, unspecified: Secondary | ICD-10-CM | POA: Diagnosis not present

## 2017-11-23 DIAGNOSIS — R569 Unspecified convulsions: Secondary | ICD-10-CM | POA: Diagnosis not present

## 2017-11-23 DIAGNOSIS — I509 Heart failure, unspecified: Secondary | ICD-10-CM | POA: Diagnosis not present

## 2017-11-23 DIAGNOSIS — I10 Essential (primary) hypertension: Secondary | ICD-10-CM | POA: Diagnosis not present

## 2017-11-23 DIAGNOSIS — E785 Hyperlipidemia, unspecified: Secondary | ICD-10-CM | POA: Diagnosis not present

## 2017-11-23 DIAGNOSIS — E1140 Type 2 diabetes mellitus with diabetic neuropathy, unspecified: Secondary | ICD-10-CM | POA: Diagnosis not present

## 2017-11-23 DIAGNOSIS — E039 Hypothyroidism, unspecified: Secondary | ICD-10-CM | POA: Diagnosis not present

## 2017-11-23 DIAGNOSIS — I480 Paroxysmal atrial fibrillation: Secondary | ICD-10-CM | POA: Diagnosis not present

## 2017-11-23 DIAGNOSIS — F339 Major depressive disorder, recurrent, unspecified: Secondary | ICD-10-CM | POA: Diagnosis not present

## 2017-11-23 DIAGNOSIS — F0391 Unspecified dementia with behavioral disturbance: Secondary | ICD-10-CM | POA: Diagnosis not present

## 2017-11-23 DIAGNOSIS — F419 Anxiety disorder, unspecified: Secondary | ICD-10-CM | POA: Diagnosis not present

## 2017-11-23 DIAGNOSIS — K219 Gastro-esophageal reflux disease without esophagitis: Secondary | ICD-10-CM | POA: Diagnosis not present

## 2017-12-01 DIAGNOSIS — D649 Anemia, unspecified: Secondary | ICD-10-CM | POA: Diagnosis not present

## 2017-12-01 DIAGNOSIS — F418 Other specified anxiety disorders: Secondary | ICD-10-CM | POA: Diagnosis not present

## 2017-12-01 DIAGNOSIS — F339 Major depressive disorder, recurrent, unspecified: Secondary | ICD-10-CM | POA: Diagnosis not present

## 2017-12-01 DIAGNOSIS — F4323 Adjustment disorder with mixed anxiety and depressed mood: Secondary | ICD-10-CM | POA: Diagnosis not present

## 2017-12-01 DIAGNOSIS — E119 Type 2 diabetes mellitus without complications: Secondary | ICD-10-CM | POA: Diagnosis not present

## 2017-12-01 DIAGNOSIS — E039 Hypothyroidism, unspecified: Secondary | ICD-10-CM | POA: Diagnosis not present

## 2017-12-01 DIAGNOSIS — N390 Urinary tract infection, site not specified: Secondary | ICD-10-CM | POA: Diagnosis not present

## 2017-12-10 DIAGNOSIS — E119 Type 2 diabetes mellitus without complications: Secondary | ICD-10-CM | POA: Diagnosis not present

## 2017-12-22 DIAGNOSIS — I480 Paroxysmal atrial fibrillation: Secondary | ICD-10-CM | POA: Diagnosis not present

## 2017-12-22 DIAGNOSIS — E039 Hypothyroidism, unspecified: Secondary | ICD-10-CM | POA: Diagnosis not present

## 2017-12-22 DIAGNOSIS — I10 Essential (primary) hypertension: Secondary | ICD-10-CM | POA: Diagnosis not present

## 2017-12-22 DIAGNOSIS — F339 Major depressive disorder, recurrent, unspecified: Secondary | ICD-10-CM | POA: Diagnosis not present

## 2017-12-22 DIAGNOSIS — E1140 Type 2 diabetes mellitus with diabetic neuropathy, unspecified: Secondary | ICD-10-CM | POA: Diagnosis not present

## 2017-12-22 DIAGNOSIS — H04129 Dry eye syndrome of unspecified lacrimal gland: Secondary | ICD-10-CM | POA: Diagnosis not present

## 2017-12-22 DIAGNOSIS — F0391 Unspecified dementia with behavioral disturbance: Secondary | ICD-10-CM | POA: Diagnosis not present

## 2017-12-22 DIAGNOSIS — I509 Heart failure, unspecified: Secondary | ICD-10-CM | POA: Diagnosis not present

## 2017-12-22 DIAGNOSIS — F419 Anxiety disorder, unspecified: Secondary | ICD-10-CM | POA: Diagnosis not present

## 2017-12-22 DIAGNOSIS — E785 Hyperlipidemia, unspecified: Secondary | ICD-10-CM | POA: Diagnosis not present

## 2017-12-31 DIAGNOSIS — E039 Hypothyroidism, unspecified: Secondary | ICD-10-CM | POA: Diagnosis not present

## 2018-01-05 DIAGNOSIS — D390 Neoplasm of uncertain behavior of uterus: Secondary | ICD-10-CM | POA: Diagnosis not present
